# Patient Record
Sex: Female | Born: 1960 | Race: White | Hispanic: No | Marital: Single | Smoking: Former smoker
Health system: Southern US, Community
[De-identification: ages and names within clinical notes are randomized; demographics above are authoritative.]

## PROBLEM LIST (undated history)

## (undated) DIAGNOSIS — J189 Pneumonia, unspecified organism: Secondary | ICD-10-CM

## (undated) DIAGNOSIS — E119 Type 2 diabetes mellitus without complications: Secondary | ICD-10-CM

## (undated) DIAGNOSIS — F419 Anxiety disorder, unspecified: Secondary | ICD-10-CM

## (undated) DIAGNOSIS — I10 Essential (primary) hypertension: Secondary | ICD-10-CM

## (undated) DIAGNOSIS — F32A Depression, unspecified: Secondary | ICD-10-CM

## (undated) HISTORY — PX: ABDOMINAL HYSTERECTOMY: SHX81

## (undated) HISTORY — PX: CARPAL TUNNEL RELEASE: SHX101

---

## 2020-05-07 ENCOUNTER — Other Ambulatory Visit: Payer: Self-pay

## 2020-05-07 ENCOUNTER — Inpatient Hospital Stay (HOSPITAL_COMMUNITY)
Admission: AD | Admit: 2020-05-07 | Discharge: 2020-06-10 | DRG: 163 | Disposition: A | Discharge status: Home / Self Care | Payer: Medicare Other | Source: Other Acute Inpatient Hospital | Attending: Internal Medicine | Admitting: Internal Medicine

## 2020-05-07 DIAGNOSIS — Z66 Do not resuscitate: Secondary | ICD-10-CM | POA: Diagnosis present

## 2020-05-07 DIAGNOSIS — J690 Pneumonitis due to inhalation of food and vomit: Secondary | ICD-10-CM | POA: Diagnosis present

## 2020-05-07 DIAGNOSIS — E1140 Type 2 diabetes mellitus with diabetic neuropathy, unspecified: Secondary | ICD-10-CM | POA: Diagnosis present

## 2020-05-07 DIAGNOSIS — J431 Panlobular emphysema: Secondary | ICD-10-CM | POA: Diagnosis not present

## 2020-05-07 DIAGNOSIS — D696 Thrombocytopenia, unspecified: Secondary | ICD-10-CM | POA: Diagnosis not present

## 2020-05-07 DIAGNOSIS — J15212 Pneumonia due to Methicillin resistant Staphylococcus aureus: Secondary | ICD-10-CM | POA: Diagnosis present

## 2020-05-07 DIAGNOSIS — D62 Acute posthemorrhagic anemia: Secondary | ICD-10-CM | POA: Diagnosis present

## 2020-05-07 DIAGNOSIS — E1169 Type 2 diabetes mellitus with other specified complication: Secondary | ICD-10-CM

## 2020-05-07 DIAGNOSIS — I2699 Other pulmonary embolism without acute cor pulmonale: Secondary | ICD-10-CM | POA: Diagnosis not present

## 2020-05-07 DIAGNOSIS — R918 Other nonspecific abnormal finding of lung field: Secondary | ICD-10-CM | POA: Diagnosis not present

## 2020-05-07 DIAGNOSIS — B964 Proteus (mirabilis) (morganii) as the cause of diseases classified elsewhere: Secondary | ICD-10-CM | POA: Diagnosis not present

## 2020-05-07 DIAGNOSIS — J948 Other specified pleural conditions: Secondary | ICD-10-CM | POA: Diagnosis present

## 2020-05-07 DIAGNOSIS — Z9689 Presence of other specified functional implants: Secondary | ICD-10-CM

## 2020-05-07 DIAGNOSIS — Z6839 Body mass index (BMI) 39.0-39.9, adult: Secondary | ICD-10-CM

## 2020-05-07 DIAGNOSIS — G8929 Other chronic pain: Secondary | ICD-10-CM | POA: Diagnosis present

## 2020-05-07 DIAGNOSIS — J9383 Other pneumothorax: Secondary | ICD-10-CM | POA: Diagnosis not present

## 2020-05-07 DIAGNOSIS — L89152 Pressure ulcer of sacral region, stage 2: Secondary | ICD-10-CM | POA: Diagnosis present

## 2020-05-07 DIAGNOSIS — R069 Unspecified abnormalities of breathing: Secondary | ICD-10-CM

## 2020-05-07 DIAGNOSIS — Z20822 Contact with and (suspected) exposure to covid-19: Secondary | ICD-10-CM | POA: Diagnosis present

## 2020-05-07 DIAGNOSIS — J189 Pneumonia, unspecified organism: Secondary | ICD-10-CM

## 2020-05-07 DIAGNOSIS — J9311 Primary spontaneous pneumothorax: Secondary | ICD-10-CM

## 2020-05-07 DIAGNOSIS — I10 Essential (primary) hypertension: Secondary | ICD-10-CM | POA: Diagnosis present

## 2020-05-07 DIAGNOSIS — Y838 Other surgical procedures as the cause of abnormal reaction of the patient, or of later complication, without mention of misadventure at the time of the procedure: Secondary | ICD-10-CM | POA: Diagnosis not present

## 2020-05-07 DIAGNOSIS — Z515 Encounter for palliative care: Secondary | ICD-10-CM | POA: Diagnosis not present

## 2020-05-07 DIAGNOSIS — L7632 Postprocedural hematoma of skin and subcutaneous tissue following other procedure: Secondary | ICD-10-CM | POA: Diagnosis present

## 2020-05-07 DIAGNOSIS — J939 Pneumothorax, unspecified: Secondary | ICD-10-CM | POA: Diagnosis not present

## 2020-05-07 DIAGNOSIS — E785 Hyperlipidemia, unspecified: Secondary | ICD-10-CM | POA: Diagnosis present

## 2020-05-07 DIAGNOSIS — U071 COVID-19: Secondary | ICD-10-CM | POA: Diagnosis not present

## 2020-05-07 DIAGNOSIS — J9811 Atelectasis: Secondary | ICD-10-CM | POA: Diagnosis present

## 2020-05-07 DIAGNOSIS — J8410 Pulmonary fibrosis, unspecified: Secondary | ICD-10-CM | POA: Diagnosis present

## 2020-05-07 DIAGNOSIS — J95860 Postprocedural hematoma of a respiratory system organ or structure following a respiratory system procedure: Secondary | ICD-10-CM | POA: Diagnosis present

## 2020-05-07 DIAGNOSIS — J9381 Chronic pneumothorax: Secondary | ICD-10-CM

## 2020-05-07 DIAGNOSIS — J90 Pleural effusion, not elsewhere classified: Secondary | ICD-10-CM

## 2020-05-07 DIAGNOSIS — S301XXA Contusion of abdominal wall, initial encounter: Secondary | ICD-10-CM | POA: Diagnosis not present

## 2020-05-07 DIAGNOSIS — J95811 Postprocedural pneumothorax: Secondary | ICD-10-CM | POA: Diagnosis not present

## 2020-05-07 DIAGNOSIS — Z7189 Other specified counseling: Secondary | ICD-10-CM | POA: Diagnosis not present

## 2020-05-07 DIAGNOSIS — K219 Gastro-esophageal reflux disease without esophagitis: Secondary | ICD-10-CM | POA: Diagnosis present

## 2020-05-07 DIAGNOSIS — T8141XA Infection following a procedure, superficial incisional surgical site, initial encounter: Secondary | ICD-10-CM | POA: Diagnosis not present

## 2020-05-07 DIAGNOSIS — J9382 Other air leak: Secondary | ICD-10-CM | POA: Diagnosis not present

## 2020-05-07 DIAGNOSIS — Z90710 Acquired absence of both cervix and uterus: Secondary | ICD-10-CM

## 2020-05-07 DIAGNOSIS — J982 Interstitial emphysema: Secondary | ICD-10-CM | POA: Diagnosis present

## 2020-05-07 DIAGNOSIS — R0902 Hypoxemia: Secondary | ICD-10-CM

## 2020-05-07 DIAGNOSIS — J9621 Acute and chronic respiratory failure with hypoxia: Secondary | ICD-10-CM | POA: Diagnosis present

## 2020-05-07 DIAGNOSIS — J440 Chronic obstructive pulmonary disease with acute lower respiratory infection: Secondary | ICD-10-CM | POA: Diagnosis present

## 2020-05-07 DIAGNOSIS — U099 Post covid-19 condition, unspecified: Secondary | ICD-10-CM | POA: Diagnosis present

## 2020-05-07 DIAGNOSIS — J942 Hemothorax: Secondary | ICD-10-CM | POA: Diagnosis not present

## 2020-05-07 DIAGNOSIS — J941 Fibrothorax: Secondary | ICD-10-CM | POA: Diagnosis not present

## 2020-05-07 DIAGNOSIS — Z8616 Personal history of COVID-19: Secondary | ICD-10-CM | POA: Diagnosis not present

## 2020-05-07 DIAGNOSIS — E1165 Type 2 diabetes mellitus with hyperglycemia: Secondary | ICD-10-CM | POA: Diagnosis present

## 2020-05-07 DIAGNOSIS — F419 Anxiety disorder, unspecified: Secondary | ICD-10-CM | POA: Diagnosis present

## 2020-05-07 DIAGNOSIS — L304 Erythema intertrigo: Secondary | ICD-10-CM | POA: Diagnosis present

## 2020-05-07 DIAGNOSIS — L8990 Pressure ulcer of unspecified site, unspecified stage: Secondary | ICD-10-CM | POA: Insufficient documentation

## 2020-05-07 DIAGNOSIS — Z09 Encounter for follow-up examination after completed treatment for conditions other than malignant neoplasm: Secondary | ICD-10-CM

## 2020-05-07 DIAGNOSIS — K5900 Constipation, unspecified: Secondary | ICD-10-CM | POA: Diagnosis not present

## 2020-05-07 DIAGNOSIS — S27331A Laceration of lung, unilateral, initial encounter: Secondary | ICD-10-CM | POA: Diagnosis present

## 2020-05-07 DIAGNOSIS — I2693 Single subsegmental pulmonary embolism without acute cor pulmonale: Secondary | ICD-10-CM | POA: Diagnosis present

## 2020-05-07 DIAGNOSIS — R5381 Other malaise: Secondary | ICD-10-CM | POA: Diagnosis present

## 2020-05-07 DIAGNOSIS — J441 Chronic obstructive pulmonary disease with (acute) exacerbation: Secondary | ICD-10-CM | POA: Diagnosis not present

## 2020-05-07 DIAGNOSIS — Z7901 Long term (current) use of anticoagulants: Secondary | ICD-10-CM

## 2020-05-07 DIAGNOSIS — D72825 Bandemia: Secondary | ICD-10-CM | POA: Diagnosis present

## 2020-05-07 DIAGNOSIS — Z7984 Long term (current) use of oral hypoglycemic drugs: Secondary | ICD-10-CM

## 2020-05-07 DIAGNOSIS — Z87891 Personal history of nicotine dependence: Secondary | ICD-10-CM

## 2020-05-07 DIAGNOSIS — Z86718 Personal history of other venous thrombosis and embolism: Secondary | ICD-10-CM | POA: Diagnosis not present

## 2020-05-07 DIAGNOSIS — Z9889 Other specified postprocedural states: Secondary | ICD-10-CM

## 2020-05-07 DIAGNOSIS — B372 Candidiasis of skin and nail: Secondary | ICD-10-CM | POA: Diagnosis present

## 2020-05-07 DIAGNOSIS — J9601 Acute respiratory failure with hypoxia: Secondary | ICD-10-CM | POA: Diagnosis not present

## 2020-05-07 DIAGNOSIS — T797XXA Traumatic subcutaneous emphysema, initial encounter: Secondary | ICD-10-CM

## 2020-05-07 DIAGNOSIS — G4733 Obstructive sleep apnea (adult) (pediatric): Secondary | ICD-10-CM | POA: Diagnosis present

## 2020-05-07 DIAGNOSIS — Z79899 Other long term (current) drug therapy: Secondary | ICD-10-CM

## 2020-05-07 DIAGNOSIS — J449 Chronic obstructive pulmonary disease, unspecified: Secondary | ICD-10-CM

## 2020-05-07 DIAGNOSIS — I97638 Postprocedural hematoma of a circulatory system organ or structure following other circulatory system procedure: Secondary | ICD-10-CM | POA: Diagnosis not present

## 2020-05-07 DIAGNOSIS — I272 Pulmonary hypertension, unspecified: Secondary | ICD-10-CM | POA: Diagnosis present

## 2020-05-07 DIAGNOSIS — L89302 Pressure ulcer of unspecified buttock, stage 2: Secondary | ICD-10-CM | POA: Diagnosis not present

## 2020-05-07 DIAGNOSIS — Z9119 Patient's noncompliance with other medical treatment and regimen: Secondary | ICD-10-CM

## 2020-05-07 DIAGNOSIS — Z8701 Personal history of pneumonia (recurrent): Secondary | ICD-10-CM

## 2020-05-07 DIAGNOSIS — Z888 Allergy status to other drugs, medicaments and biological substances status: Secondary | ICD-10-CM

## 2020-05-07 DIAGNOSIS — I454 Nonspecific intraventricular block: Secondary | ICD-10-CM | POA: Diagnosis present

## 2020-05-07 DIAGNOSIS — R0602 Shortness of breath: Secondary | ICD-10-CM

## 2020-05-07 HISTORY — DX: Depression, unspecified: F32.A

## 2020-05-07 HISTORY — DX: Pneumonia, unspecified organism: J18.9

## 2020-05-07 HISTORY — DX: Anxiety disorder, unspecified: F41.9

## 2020-05-07 HISTORY — DX: Essential (primary) hypertension: I10

## 2020-05-07 HISTORY — DX: Type 2 diabetes mellitus without complications: E11.9

## 2020-05-07 LAB — GLUCOSE, CAPILLARY: Glucose-Capillary: 287 mg/dL — ABNORMAL HIGH (ref 70–99)

## 2020-05-07 MED ORDER — CLONAZEPAM 0.5 MG PO TABS
0.5000 mg | ORAL_TABLET | Freq: Two times a day (BID) | ORAL | Status: DC
  Administered 2020-05-08 – 2020-06-10 (×67): 0.5 mg via ORAL
  Filled 2020-05-07 (×67): qty 1

## 2020-05-07 MED ORDER — IPRATROPIUM-ALBUTEROL 0.5-2.5 (3) MG/3ML IN SOLN: 3.0000 mL | RESPIRATORY_TRACT | Status: DC | PRN

## 2020-05-07 MED ORDER — ATORVASTATIN CALCIUM 40 MG PO TABS
40.0000 mg | ORAL_TABLET | Freq: Every day | ORAL | Status: DC
  Administered 2020-05-08 – 2020-06-10 (×33): 40 mg via ORAL
  Filled 2020-05-07 (×34): qty 1

## 2020-05-07 MED ORDER — INSULIN ASPART 100 UNIT/ML ~~LOC~~ SOLN
0.0000 [IU] | Freq: Every day | SUBCUTANEOUS | Status: DC
  Administered 2020-05-08: 3 [IU] via SUBCUTANEOUS

## 2020-05-07 MED ORDER — INSULIN ASPART 100 UNIT/ML ~~LOC~~ SOLN
0.0000 [IU] | Freq: Three times a day (TID) | SUBCUTANEOUS | Status: DC
  Administered 2020-05-08 (×2): 3 [IU] via SUBCUTANEOUS

## 2020-05-07 MED ORDER — ACETAMINOPHEN 650 MG RE SUPP: 650.0000 mg | Freq: Four times a day (QID) | RECTAL | Status: DC | PRN

## 2020-05-07 MED ORDER — ACETAMINOPHEN 325 MG PO TABS: 650.0000 mg | ORAL_TABLET | Freq: Four times a day (QID) | ORAL | Status: DC | PRN

## 2020-05-07 MED ORDER — VITAMIN D 25 MCG (1000 UNIT) PO TABS
1000.0000 [IU] | ORAL_TABLET | Freq: Every day | ORAL | Status: DC
  Administered 2020-05-08 – 2020-06-10 (×33): 1000 [IU] via ORAL
  Filled 2020-05-07 (×33): qty 1

## 2020-05-07 MED ORDER — PREGABALIN 75 MG PO CAPS
150.0000 mg | ORAL_CAPSULE | Freq: Three times a day (TID) | ORAL | Status: DC
  Administered 2020-05-08 – 2020-06-10 (×98): 150 mg via ORAL
  Filled 2020-05-07 (×32): qty 2
  Filled 2020-05-07: qty 6
  Filled 2020-05-07 (×14): qty 2
  Filled 2020-05-07: qty 6
  Filled 2020-05-07 (×2): qty 2
  Filled 2020-05-07: qty 6
  Filled 2020-05-07 (×5): qty 2
  Filled 2020-05-07: qty 6
  Filled 2020-05-07 (×13): qty 2
  Filled 2020-05-07: qty 6
  Filled 2020-05-07 (×27): qty 2
  Filled 2020-05-07: qty 6

## 2020-05-07 MED ORDER — PANTOPRAZOLE SODIUM 40 MG PO TBEC
40.0000 mg | DELAYED_RELEASE_TABLET | Freq: Two times a day (BID) | ORAL | Status: DC
  Administered 2020-05-08 – 2020-05-11 (×9): 40 mg via ORAL
  Filled 2020-05-07 (×9): qty 1

## 2020-05-07 MED ORDER — HEPARIN (PORCINE) 25000 UT/250ML-% IV SOLN
1800.0000 [IU]/h | INTRAVENOUS | Status: DC
  Administered 2020-05-08 (×2): 1800 [IU]/h via INTRAVENOUS
  Filled 2020-05-07 (×2): qty 250

## 2020-05-07 MED ORDER — FENOFIBRATE 160 MG PO TABS
160.0000 mg | ORAL_TABLET | Freq: Every day | ORAL | Status: DC
  Administered 2020-05-08 – 2020-06-10 (×33): 160 mg via ORAL
  Filled 2020-05-07 (×34): qty 1

## 2020-05-07 NOTE — Progress Notes (Signed)
ANTICOAGULATION CONSULT NOTE - Initial Consult  Pharmacy Consult for Heparin  Indication: pulmonary embolus  Allergies  Allergen Reactions  . Cymbalta [Duloxetine Hcl]     Psychosis "self-harm"   Patient Measurements: Height: 5\' 8"  (172.7 cm) Weight: 117.9 kg (259 lb 14.8 oz) IBW/kg (Calculated) : 63.9  Vital Signs: Temp: 97.3 F (36.3 C) (11/23 2100) Temp Source: Oral (11/23 2100) BP: 121/85 (11/23 2100) Pulse Rate: 81 (11/23 2100)  Assessment: 59 y/o F transfer from [facility]. Pt has PE and had been on Apixaban. Heparin transfusing at 1800 units/hr when she arrived at [facility]. RN did turn heparin off upon transfer for a few hours.   Goal of Therapy:  Heparin level 0.3-0.7 units/ml Monitor platelets by anticoagulation protocol: Yes   Plan:  -Re-start heparin drip at 1800 units/hr -0800 heparin level -Daily CBC/heparin level -Monitor for bleeding   [facility], PharmD, BCPS Clinical Pharmacist Phone: [phone]

## 2020-05-07 NOTE — H&P (Signed)
History and Physical    Artis Buechele FWY:[id number] DOB: 06-Jul-1960 DOA: 05/07/2020  PCP: No primary care provider on file. Patient coming from: [facility]  HPI: Annette [name] is a 59 y.o. female with medical history significant of Covid pneumonia approximately 2 months ago, recent PE on Eliquis, COPD, asthma, hypertension, diabetes presenting to [facility] today from [facility] where she was admitted for right upper lobe MRSA pneumonia (treated with linezolid) and right lower lobe subsegmental PE (echo showing moderate pulmonary hypertension but no right heart strain) treated with heparin drip, developed right-sided hydropneumothorax 2 days ago.  Underwent chest tube insertion yesterday and stable on 3 L supplemental oxygen.  Pulmonology at [facility] spoke to CT surgeon at [facility] who recommended transfer to [facility] for VATS.  Signout obtained from Dr. Chipper Herb who is the physician who accepted transfer from [facility].  No discharge summary or imaging results in the chart sent by the outside hospital.  Patient currently somnolent and history limited.  States she went to the hospital as she was feeling short of breath and coughing, unclear when her symptoms started.  Denies any fevers or chills.  Denies chest pain.  States since she had a chest tube placed yesterday her breathing is now better.  Reports history of COPD but is not on home oxygen.  No additional history could be obtained from her.  Review of Systems:  All systems reviewed and apart from history of presenting illness, are negative.  Past medical history: See HPI.  Past Surgical History:  Procedure Laterality Date  . ABDOMINAL HYSTERECTOMY       reports that she has quit smoking. Her smoking use included cigarettes. She has never used smokeless tobacco. She reports previous alcohol use. She reports that she does not use drugs.  Allergies  Allergen Reactions  . Cymbalta  [Duloxetine Hcl]     Psychosis "self-harm"    History reviewed. No pertinent family history.  Prior to Admission medications   Medication Sig Start Date End Date Taking? Authorizing Provider  atorvastatin (LIPITOR) 40 MG tablet Take 40 mg by mouth daily. 03/14/20  Yes [provider]  cholecalciferol (VITAMIN D3) 25 MCG (1000 UNIT) tablet Take 1,000 Units by mouth daily.   Yes [provider]  clonazePAM (KLONOPIN) 0.5 MG tablet Take 0.5 mg by mouth 2 (two) times daily.  04/11/20  Yes [provider]  fenofibrate 160 MG tablet Take 160 mg by mouth daily. 02/15/20  Yes [provider]  glipiZIDE (GLUCOTROL) 10 MG tablet Take 10 mg by mouth 2 (two) times daily. 12/20/19  Yes [provider]  montelukast (SINGULAIR) 10 MG tablet Take 10 mg by mouth daily. 03/14/20  Yes [provider]  omeprazole (PRILOSEC) 40 MG capsule Take 40 mg by mouth 2 (two) times daily. 02/18/20  Yes [provider]  pregabalin (LYRICA) 150 MG capsule Take 150 mg by mouth in the morning, at noon, and at bedtime.  04/15/20  Yes [provider]    Physical Exam: Vitals:   05/07/20 2100  BP: 121/85  Pulse: 81  Resp: 20  Temp: (!) 97.3 F (36.3 C)  TempSrc: Oral  SpO2: 97%  Weight: 117.9 kg  Height: 5\' 8"  (1.727 m)    Physical Exam Constitutional:      General: She is not in acute distress. HENT:     Head: Normocephalic and atraumatic.  Eyes:     Extraocular Movements: Extraocular movements intact.  Conjunctiva/sclera: Conjunctivae normal.  Cardiovascular:     Rate and Rhythm: Normal rate and regular rhythm.     Pulses: Normal pulses.  Pulmonary:     Effort: Pulmonary effort is normal.     Breath sounds: No wheezing.     Comments: Satting well on 3 L supplemental oxygen Abdominal:     General: Bowel sounds are normal. There is no distension.     Palpations: Abdomen is soft.     Tenderness: There is no abdominal tenderness.   Musculoskeletal:        General: No swelling or tenderness.     Cervical back: Normal range of motion and neck supple.  Skin:    General: Skin is warm and dry.  Neurological:     Mental Status: She is alert and oriented to person, place, and time.     Labs on Admission: I have personally reviewed following labs and imaging studies  CBC: No results for input(s): WBC, NEUTROABS, HGB, HCT, MCV, PLT in the last 168 hours. Basic Metabolic Panel: No results for input(s): NA, K, CL, CO2, GLUCOSE, BUN, CREATININE, CALCIUM, MG, PHOS in the last 168 hours. GFR: CrCl cannot be calculated (No successful lab value found.). Liver Function Tests: No results for input(s): AST, ALT, ALKPHOS, BILITOT, PROT, ALBUMIN in the last 168 hours. No results for input(s): LIPASE, AMYLASE in the last 168 hours. No results for input(s): AMMONIA in the last 168 hours. Coagulation Profile: No results for input(s): INR, PROTIME in the last 168 hours. Cardiac Enzymes: No results for input(s): CKTOTAL, CKMB, CKMBINDEX, TROPONINI in the last 168 hours. BNP (last 3 results) No results for input(s): PROBNP in the last 8760 hours. HbA1C: No results for input(s): HGBA1C in the last 72 hours. CBG: Recent Labs  Lab 05/07/20 2200  GLUCAP 287*   Lipid Profile: No results for input(s): CHOL, HDL, LDLCALC, TRIG, CHOLHDL, LDLDIRECT in the last 72 hours. Thyroid Function Tests: No results for input(s): TSH, T4TOTAL, FREET4, T3FREE, THYROIDAB in the last 72 hours. Anemia Panel: No results for input(s): VITAMINB12, FOLATE, FERRITIN, TIBC, IRON, RETICCTPCT in the last 72 hours. Urine analysis: No results found for: COLORURINE, APPEARANCEUR, LABSPEC, PHURINE, GLUCOSEU, HGBUR, BILIRUBINUR, KETONESUR, PROTEINUR, UROBILINOGEN, NITRITE, LEUKOCYTESUR  Radiological Exams on Admission: No results found.  Assessment/Plan Principal Problem:   MRSA pneumonia (HCC) Active Problems:   Pulmonary embolism (HCC)    Pneumothorax   Acute hypoxemic respiratory failure (HCC)   COPD (chronic obstructive pulmonary disease) (HCC)   Right upper lobe MRSA pneumonia No fever or tachycardia at this time.  Stable on 3 L supplemental oxygen. -Check WBC count, lactate, procalcitonin, blood cultures -Continue linezolid  Acute right lower lobe subsegmental PE -Echo done at outside hospital revealed moderate pulmonary hypertension but no right heart strain -Continue heparin for anticoagulation  Right-sided hydropneumothorax Underwent chest tube insertion yesterday at [facility].  Currently stable on 3 L supplemental oxygen.  Pulmonology at [facility] spoke to CT surgeon at [facility] who recommended transfer for VATS. -Repeat chest x-ray now -I have notified Dr. Cliffton Asters from cardiothoracic surgery that the patient is now here and have requested consultation.  He requested chest CT in the morning which has been ordered.  Acute hypoxemic respiratory failure -Currently stable on 3 L supplemental oxygen -Continuous pulse ox  COPD Stable.  No signs of acute exacerbation at this time. -DuoNebs as needed  Hypertension Stable.  Currently normotensive. -Pharmacy med rec pending.  Hyperlipidemia -Continue Lipitor and fenofibrate  Noninsulin-dependent type II diabetes -  Check A1c.  Hold home glipizide.  Sliding scale insulin sensitive ACHS and CBG checks.  GERD -Continue PPI  Anxiety -Continue home Klonopin  Neuropathy/chronic pain -Continue home Lyrica  DVT prophylaxis: Heparin Code Status: Full code Family Communication: No family available at this time. Disposition Plan: Status is: Inpatient  Remains inpatient appropriate because:IV treatments appropriate due to intensity of illness or inability to take PO, Inpatient level of care appropriate due to severity of illness and Needs VATS   Dispo: The patient is from: [facility]              Anticipated d/c is to: Home               Anticipated d/c date is: > 3 days              Patient currently is not medically stable to d/c.  The medical decision making on this patient was of high complexity and the patient is at high risk for clinical deterioration, therefore this is a level 3 visit.  John Giovanni MD [facility]  If 7PM-7AM, please contact night-coverage [email or web address]  05/08/2020, 12:03 AM

## 2020-05-08 ENCOUNTER — Inpatient Hospital Stay (HOSPITAL_COMMUNITY): Payer: Medicare Other

## 2020-05-08 ENCOUNTER — Encounter (HOSPITAL_COMMUNITY): Payer: Self-pay | Admitting: Internal Medicine

## 2020-05-08 DIAGNOSIS — Z8616 Personal history of COVID-19: Secondary | ICD-10-CM

## 2020-05-08 DIAGNOSIS — J9311 Primary spontaneous pneumothorax: Secondary | ICD-10-CM | POA: Diagnosis not present

## 2020-05-08 DIAGNOSIS — L89302 Pressure ulcer of unspecified buttock, stage 2: Secondary | ICD-10-CM

## 2020-05-08 DIAGNOSIS — J9601 Acute respiratory failure with hypoxia: Secondary | ICD-10-CM | POA: Diagnosis not present

## 2020-05-08 DIAGNOSIS — J15212 Pneumonia due to Methicillin resistant Staphylococcus aureus: Secondary | ICD-10-CM | POA: Diagnosis not present

## 2020-05-08 DIAGNOSIS — J9383 Other pneumothorax: Secondary | ICD-10-CM | POA: Diagnosis not present

## 2020-05-08 DIAGNOSIS — E1165 Type 2 diabetes mellitus with hyperglycemia: Secondary | ICD-10-CM

## 2020-05-08 DIAGNOSIS — I2693 Single subsegmental pulmonary embolism without acute cor pulmonale: Secondary | ICD-10-CM | POA: Diagnosis not present

## 2020-05-08 DIAGNOSIS — L8990 Pressure ulcer of unspecified site, unspecified stage: Secondary | ICD-10-CM | POA: Insufficient documentation

## 2020-05-08 LAB — CBC
HCT: 36.3 % (ref 36.0–46.0)
Hemoglobin: 11.2 g/dL — ABNORMAL LOW (ref 12.0–15.0)
MCH: 28.1 pg (ref 26.0–34.0)
MCHC: 30.9 g/dL (ref 30.0–36.0)
MCV: 91 fL (ref 80.0–100.0)
Platelets: 129 10*3/uL — ABNORMAL LOW (ref 150–400)
RBC: 3.99 MIL/uL (ref 3.87–5.11)
RDW: 15.9 % — ABNORMAL HIGH (ref 11.5–15.5)
WBC: 12.2 10*3/uL — ABNORMAL HIGH (ref 4.0–10.5)
nRBC: 0 % (ref 0.0–0.2)

## 2020-05-08 LAB — COMPREHENSIVE METABOLIC PANEL
ALT: 34 U/L (ref 0–44)
AST: 25 U/L (ref 15–41)
Albumin: 1.8 g/dL — ABNORMAL LOW (ref 3.5–5.0)
Alkaline Phosphatase: 51 U/L (ref 38–126)
Anion gap: 5 (ref 5–15)
BUN: 21 mg/dL — ABNORMAL HIGH (ref 6–20)
CO2: 34 mmol/L — ABNORMAL HIGH (ref 22–32)
Calcium: 8.3 mg/dL — ABNORMAL LOW (ref 8.9–10.3)
Chloride: 96 mmol/L — ABNORMAL LOW (ref 98–111)
Creatinine, Ser: 0.7 mg/dL (ref 0.44–1.00)
GFR, Estimated: >60 mL/min (ref >60)
Glucose, Bld: 282 mg/dL — ABNORMAL HIGH (ref 70–99)
Potassium: 4.6 mmol/L (ref 3.5–5.1)
Sodium: 135 mmol/L (ref 135–145)
Total Bilirubin: 0.8 mg/dL (ref 0.3–1.2)
Total Protein: 4.2 g/dL — ABNORMAL LOW (ref 6.5–8.1)

## 2020-05-08 LAB — HEMOGLOBIN A1C
Hgb A1c MFr Bld: 10.7 % — ABNORMAL HIGH (ref 4.8–5.6)
Mean Plasma Glucose: 260.39 mg/dL

## 2020-05-08 LAB — HEPARIN LEVEL (UNFRACTIONATED): Heparin Unfractionated: <0.1 IU/mL — ABNORMAL LOW (ref 0.30–0.70)

## 2020-05-08 LAB — PROCALCITONIN: Procalcitonin: <0.1 ng/mL

## 2020-05-08 LAB — GLUCOSE, CAPILLARY
Glucose-Capillary: 268 mg/dL — ABNORMAL HIGH (ref 70–99)
Glucose-Capillary: 201 mg/dL — ABNORMAL HIGH (ref 70–99)
Glucose-Capillary: 210 mg/dL — ABNORMAL HIGH (ref 70–99)
Glucose-Capillary: 193 mg/dL — ABNORMAL HIGH (ref 70–99)
Glucose-Capillary: 340 mg/dL — ABNORMAL HIGH (ref 70–99)

## 2020-05-08 LAB — LACTIC ACID, PLASMA: Lactic Acid, Venous: 1.6 mmol/L (ref 0.5–1.9)

## 2020-05-08 LAB — HIV ANTIBODY (ROUTINE TESTING W REFLEX): HIV Screen 4th Generation wRfx: NONREACTIVE

## 2020-05-08 MED ORDER — HEPARIN BOLUS VIA INFUSION
3000.0000 [IU] | Freq: Once | INTRAVENOUS | Status: AC
  Administered 2020-05-08: 3000 [IU] via INTRAVENOUS
  Filled 2020-05-08: qty 3000

## 2020-05-08 MED ORDER — OXYCODONE HCL 5 MG PO TABS
5.0000 mg | ORAL_TABLET | Freq: Four times a day (QID) | ORAL | Status: DC | PRN
  Administered 2020-05-08 – 2020-05-12 (×6): 5 mg via ORAL
  Filled 2020-05-08 (×6): qty 1

## 2020-05-08 MED ORDER — ACETAMINOPHEN 500 MG PO TABS
1000.0000 mg | ORAL_TABLET | Freq: Three times a day (TID) | ORAL | Status: DC
  Administered 2020-05-08 – 2020-05-12 (×12): 1000 mg via ORAL
  Filled 2020-05-08 (×12): qty 2

## 2020-05-08 MED ORDER — HEPARIN (PORCINE) 25000 UT/250ML-% IV SOLN
1800.0000 [IU]/h | INTRAVENOUS | Status: DC
  Administered 2020-05-09 – 2020-05-10 (×3): 1800 [IU]/h via INTRAVENOUS
  Filled 2020-05-08 (×3): qty 250

## 2020-05-08 MED ORDER — INSULIN ASPART 100 UNIT/ML ~~LOC~~ SOLN
0.0000 [IU] | Freq: Every day | SUBCUTANEOUS | Status: DC
  Administered 2020-05-08: 4 [IU] via SUBCUTANEOUS

## 2020-05-08 MED ORDER — PNEUMOCOCCAL VAC POLYVALENT 25 MCG/0.5ML IJ INJ: 0.5000 mL | INJECTION | INTRAMUSCULAR | Status: DC | PRN

## 2020-05-08 MED ORDER — MORPHINE SULFATE (PF) 2 MG/ML IV SOLN
2.0000 mg | INTRAVENOUS | Status: DC | PRN
  Administered 2020-05-08: 2 mg via INTRAVENOUS
  Administered 2020-05-08: 4 mg via INTRAVENOUS
  Filled 2020-05-08: qty 2
  Filled 2020-05-08: qty 1

## 2020-05-08 MED ORDER — FENTANYL CITRATE (PF) 100 MCG/2ML IJ SOLN
INTRAMUSCULAR | Status: AC
  Filled 2020-05-08: qty 2

## 2020-05-08 MED ORDER — INFLUENZA VAC SPLIT QUAD 0.5 ML IM SUSY: 0.5000 mL | PREFILLED_SYRINGE | INTRAMUSCULAR | Status: DC | PRN

## 2020-05-08 MED ORDER — LINEZOLID 600 MG PO TABS
600.0000 mg | ORAL_TABLET | Freq: Two times a day (BID) | ORAL | Status: DC
  Administered 2020-05-08 – 2020-05-14 (×13): 600 mg via ORAL
  Filled 2020-05-08 (×15): qty 1

## 2020-05-08 MED ORDER — MIDAZOLAM HCL 2 MG/2ML IJ SOLN
INTRAMUSCULAR | Status: AC | PRN
  Administered 2020-05-08: 0.5 mg via INTRAVENOUS
  Administered 2020-05-08: 1 mg via INTRAVENOUS

## 2020-05-08 MED ORDER — MIDAZOLAM HCL 2 MG/2ML IJ SOLN
INTRAMUSCULAR | Status: AC
  Filled 2020-05-08: qty 2

## 2020-05-08 MED ORDER — HYDROCODONE-ACETAMINOPHEN 5-325 MG PO TABS
1.0000 | ORAL_TABLET | ORAL | Status: DC | PRN
  Administered 2020-05-08: 2 via ORAL
  Filled 2020-05-08: qty 2
  Filled 2020-05-08: qty 1

## 2020-05-08 MED ORDER — FENTANYL CITRATE (PF) 100 MCG/2ML IJ SOLN
INTRAMUSCULAR | Status: AC | PRN
Start: 2020-05-08 — End: 2020-05-08
  Administered 2020-05-08: 25 ug via INTRAVENOUS

## 2020-05-08 MED ORDER — INSULIN ASPART 100 UNIT/ML ~~LOC~~ SOLN
0.0000 [IU] | Freq: Three times a day (TID) | SUBCUTANEOUS | Status: DC
  Administered 2020-05-08: 3 [IU] via SUBCUTANEOUS
  Administered 2020-05-09 (×2): 8 [IU] via SUBCUTANEOUS

## 2020-05-08 MED ORDER — MORPHINE SULFATE (PF) 2 MG/ML IV SOLN
3.0000 mg | INTRAVENOUS | Status: DC | PRN
  Administered 2020-05-08 – 2020-05-12 (×25): 3 mg via INTRAVENOUS
  Filled 2020-05-08 (×25): qty 2

## 2020-05-08 NOTE — Progress Notes (Signed)
ANTICOAGULATION CONSULT NOTE  Pharmacy Consult for Heparin  Indication: pulmonary embolus  Allergies  Allergen Reactions  . Cymbalta [Duloxetine Hcl]     Psychosis "self-harm"   Patient Measurements: Height: 5\' 8"  (172.7 cm) Weight: 118.8 kg (261 lb 14.5 oz) IBW/kg (Calculated) : 63.9  Vital Signs: BP: (P) 123/67 (11/24 1644) Pulse Rate: (P) 101 (11/24 1644)  Assessment: 59 y/o F transfer from [facility]. Pt has PE and had been on Apixaban recently (last dose not clear) and was transferred on heparin   -heparin level < 0.1 -RN reports IV line infiltrated, a new line was placed and heparin was restarted ~ 9:30am  Pt is s/p chest tube placement. Ok to resume IV heparin for PE. We will resume without bolus.   Goal of Therapy:  Heparin level 0.3-0.7 units/ml Monitor platelets by anticoagulation protocol: Yes   Plan:  -Resume heparin 1800 units/hr -Heparin level in 6 hours and daily wth CBC daily  [facility], PharmD, [place], AAHIVP, CPP Infectious Disease Pharmacist 05/08/2020 4:47 PM

## 2020-05-08 NOTE — Progress Notes (Addendum)
Inpatient Diabetes Program Recommendations  AACE/ADA: New Consensus Statement on Inpatient Glycemic Control (2015)  Target Ranges:  Prepandial:   less than 140 mg/dL      Peak postprandial:   less than 180 mg/dL (1-2 hours)      Critically ill patients:  140 - 180 mg/dL   Results for [facility] (MRN [id number]) as of 05/08/2020 11:09  Ref. Range 05/07/2020 22:00 05/08/2020 01:08 05/08/2020 07:56  Glucose-Capillary Latest Ref Range: 70 - 99 mg/dL 287 (H) 268 (H)  3 units NOVOLOG 201 (H)  3 units NOVOLOG    Results for Annette [name], Annette [name] (MRN [id number]) as of 05/08/2020 07:11  Ref. Range 05/08/2020 00:16  Hemoglobin A1C Latest Ref Range: 4.8 - 5.6 % 10.7 (H)  260 mg/dl   Admit with: right upper lobe MRSA pneumonia/ right lower lobe subsegmental PE   History: DM  Home DM Meds: Glipizide 10 mg BID       Humalog SSi (per pt report)  Current Orders: Novolog Sensitive Correction Scale/ SSI (0-9 units) TID AC + HS    MD- May consider adding low dose basal insulin to current inpatient insulin regimen:  Lantus 8 units QHS (0.075 units/kg)  May need escalation of home DM meds--Only taking Glipizide at home and current A1c= 10.7%    Addendum 12pm--Met w/ pt at bedside this AM.  Kept visit short as pt seemed very uncomfortable this afternoon.  Pt stated she has been taking Glipizide 10 mg BID + Humalog SSI TID with meals at home.  Sees Dr. Delia Chimes in [place] for regular medical care.  Reviewed her Current A1c of 10.7% and the significance of this.  Discussed with pt that given her A1c and current illness, she may need escalation of DM meds for home at time of d/c.  Pt stated she is already taking Humalog insulin and would be OK taking more insulin if needed.    --Will follow patient during hospitalization--  Wyn Quaker RN, MSN, CDE Diabetes Coordinator Inpatient Glycemic Control Team Team Pager: [phone] (8a-5p)

## 2020-05-08 NOTE — Progress Notes (Signed)
Patient stated she got no relief from morphine given

## 2020-05-08 NOTE — Progress Notes (Addendum)
Patient arrived to the hospital transported via ambulance, alert  and oriented times four, denies pain at time of arrival. oriented to room/unit policies. Admission question completed, skin assesemnt done with [facility] RN. Patient has chest tube on her right lateral chest placed  while at  the other hospital. chest tube is clamped on time of arrival, and the chamber has lots of bumbling. Called the rapid response RN to came help assess patient's chest tube. CT insicion site covered with dressing, CDI no drainage noted.  Pt. has Heparin infusion also at time of arrival infusing @18ml /hr. Notified hospitalist - new admissions/consult   about patient's arrival and for new orders.

## 2020-05-08 NOTE — Consult Note (Signed)
[address]       [place] [address]             [id number]                    Annette [name] [facility] Medical Record #[id number] Date of Birth: [birth year]  Referring: No ref. provider found Primary Care: Patient, No Pcp Per Primary Cardiologist: No primary care provider on file.  Chief Complaint:   No chief complaint on file.   History of Present Illness:    Annette [name] 59 y.o. female transferred from an outside facility to assist with management of a complicated pneumothorax.  She has a history of Covid pneumonia 2 months ago which was complicated by her embolisms.  She was admitted to [facility] for management of a right-sided MRSA pneumonia she subsequently developed a hydropneumothorax and required chest tube insertion.  Due to the complexity of her pneumothorax she was transferred to [facility] for further care.      Past medical history COPD Hypertension Diabetes Asthma   Past Surgical History:  Procedure Laterality Date  . ABDOMINAL HYSTERECTOMY      History reviewed. No pertinent family history.   Social History   Tobacco Use  Smoking Status Former Smoker  . Types: Cigarettes  Smokeless Tobacco Never Used    Social History   Substance and Sexual Activity  Alcohol Use Not Currently     Allergies  Allergen Reactions  . Cymbalta [Duloxetine Hcl]     Psychosis "self-harm"    Current Facility-Administered Medications  Medication Dose Route Frequency Provider Last Rate Last Admin  . acetaminophen (TYLENOL) tablet 650 mg  650 mg Oral Q6H PRN John Giovanni, MD       Or  . acetaminophen (TYLENOL) suppository 650 mg  650 mg Rectal Q6H PRN John Giovanni, MD      . atorvastatin (LIPITOR) tablet 40 mg  40 mg Oral Daily John Giovanni, MD   40 mg at 05/08/20 1056  . cholecalciferol (VITAMIN D3) tablet 1,000 Units  1,000 Units Oral Daily John Giovanni, MD   1,000 Units at 05/08/20 1056   . clonazePAM (KLONOPIN) tablet 0.5 mg  0.5 mg Oral BID John Giovanni, MD   0.5 mg at 05/08/20 1056  . fenofibrate tablet 160 mg  160 mg Oral Daily John Giovanni, MD   160 mg at 05/08/20 1057  . heparin ADULT infusion 100 units/mL (25000 units/250mL sodium chloride 0.45%)  1,800 Units/hr Intravenous Continuous Stevphen Rochester, RPH 18 mL/hr at 05/08/20 0040 1,800 Units/hr at 05/08/20 0040  . insulin aspart (novoLOG) injection 0-5 Units  0-5 Units Subcutaneous QHS John Giovanni, MD   3 Units at 05/08/20 0118  . insulin aspart (novoLOG) injection 0-9 Units  0-9 Units Subcutaneous TID WC John Giovanni, MD   3 Units at 05/08/20 0850  . ipratropium-albuterol (DUONEB) 0.5-2.5 (3) MG/3ML nebulizer solution 3 mL  3 mL Nebulization Q4H PRN John Giovanni, MD      . linezolid (ZYVOX) tablet 600 mg  600 mg Oral Q12H John Giovanni, MD   600 mg at 05/08/20 1057  . morphine 2 MG/ML injection 2-4 mg  2-4 mg Intravenous Q4H PRN Opyd, Lavone Neri, MD   4 mg at 05/08/20 0930  . pantoprazole (PROTONIX) EC tablet 40 mg  40 mg Oral BID John Giovanni, MD   40 mg at 05/08/20 1057  . pregabalin (LYRICA) capsule 150 mg  150 mg  Oral TID John Giovanni, MD   150 mg at 05/08/20 1057    Review of Systems  Respiratory: Positive for cough and shortness of breath.   Cardiovascular: Positive for chest pain.     PHYSICAL EXAMINATION: BP (!) 144/81 (BP Location: Right Arm)   Pulse 85   Temp 97.7 F (36.5 C) (Oral)   Resp (!) 23   Ht 5\' 8"  (1.727 m)   Wt 118.8 kg   SpO2 96%   BMI 39.82 kg/m  Physical Exam Constitutional:      General: She is not in acute distress.    Appearance: She is not ill-appearing or toxic-appearing.  HENT:     Head: Normocephalic and atraumatic.  Pulmonary:     Effort: Pulmonary effort is normal.     Comments: Coarse breath sounds Large air leak on Pleur-evac system. Neurological:     General: No focal deficit present.     Mental Status: She is alert  and oriented to person, place, and time.     Diagnostic Studies & Laboratory data:     Recent Radiology Findings:   CT CHEST WO CONTRAST  Result Date: 05/08/2020 CLINICAL DATA:  Shortness of breath and cough. EXAM: CT CHEST WITHOUT CONTRAST TECHNIQUE: Multidetector CT imaging of the chest was performed following the standard protocol without IV contrast. COMPARISON:  Chest radiograph 05/08/2020 FINDINGS: Cardiovascular: Normal heart size. No pericardial effusions. Normal caliber thoracic aorta with scattered calcification. Coronary artery calcification. Mediastinum/Nodes: Pneumomediastinum is present. Subcutaneous emphysema throughout the right chest wall and extending to the right side of the neck. No significant lymphadenopathy. Visualized lymph nodes are not pathologically enlarged. Esophagus is mostly decompressed. Lungs/Pleura: Moderately large right pneumothorax. A right chest tube is in place. Atelectasis and consolidation in the right lung most prominent in the right upper lung. Air bronchograms and emphysematous changes are present. Small bilateral pleural effusions. Emphysematous changes and scattered fibrosis in the left lung. Upper Abdomen: Cyst in the liver. No acute process demonstrated in the upper abdomen. Musculoskeletal: Degenerative changes in the spine. No destructive bone lesions. IMPRESSION: 1. Moderately large right pneumothorax with right chest tube in place. Subcutaneous emphysema throughout the right chest wall and extending to the right side of the neck. Pneumomediastinum. 2. Atelectasis and consolidation in the right lung most prominent in the right upper lung. 3. Small bilateral pleural effusions. 4. Emphysematous changes and fibrosis in the left lung. 5. Emphysema and aortic atherosclerosis. Aortic Atherosclerosis (ICD10-I70.0) and Emphysema (ICD10-J43.9). Electronically Signed   By: 05/10/2020 M.D.   On: 05/08/2020 02:15   DG CHEST PORT 1 VIEW  Result Date:  05/08/2020 CLINICAL DATA:  Pneumothorax EXAM: PORTABLE CHEST 1 VIEW COMPARISON:  November 28, 2012 FINDINGS: There is a right-sided chest tube in place. The chest tube may be kinked. There is extensive subcutaneous gas along the patient's right chest wall and right neck. There is diffuse opacification of the right upper lung zone as well as the right lower lung zone. There is a probable small right-sided pneumothorax. No evidence for left-sided pneumothorax. The heart size is unremarkable. IMPRESSION: 1. Right-sided chest tube in place.  The chest tube may be kinked. 2. Probable small right-sided pneumothorax. 3. Diffuse opacification of the right upper lung zone and portions of the right lower lobe which may be postsurgical or posttraumatic in etiology. Alternatively, this may represent multifocal pneumonia. 4. Subcutaneous gas is noted along the patient's right chest wall and neck. Electronically Signed   By: November 30, 2012.D.  On: 05/08/2020 00:33       I have independently reviewed the above radiology studies  and reviewed the findings with the patient.   Recent Lab Findings: Lab Results  Component Value Date   WBC 12.2 (H) 05/08/2020   HGB 11.2 (L) 05/08/2020   HCT 36.3 05/08/2020   PLT 129 (L) 05/08/2020   GLUCOSE 282 (H) 05/08/2020   ALT 34 05/08/2020   AST 25 05/08/2020   NA 135 05/08/2020   K 4.6 05/08/2020   CL 96 (L) 05/08/2020   CREATININE 0.70 05/08/2020   BUN 21 (H) 05/08/2020   CO2 34 (H) 05/08/2020   HGBA1C 10.7 (H) 05/08/2020     Assessment / Plan:   59 year old female presents with a complicated hydropneumothorax in the setting of an MRSA pneumonia.  She is also had Covid pneumonia 2 months ago.  Personally reviewed her CT scan, and she still has a large pneumothorax apically as well as significant subcutaneous and mediastinal emphysema.  She denies any dysphagia or diet aphasia thus I think the pneumomediastinum is all related to her pulmonary process.  Her right  upper lobe is completely consolidated, and the surgical management of this will likely not yield a good result.    I have ordered a CT-guided drain to be placed apically to assist with lung expansion for now we will continue conservative management with chest tube drainage.    Of note the chest tube currently in place appears to be in the fissure which likely explains incomplete expansion of the lung.   I  spent 30 minutes with  the patient face to face and greater then 50% of the time was spent in counseling and coordination of care.    Corliss Skains 05/08/2020 10:58 AM

## 2020-05-08 NOTE — Procedures (Signed)
  Procedure: CT guided R chest tube placement 38f EBL:   minimal Complications:  none immediate  See full dictation in [employer].  Thora Lance MD Main # [phone] Pager  [phone] Mobile [phone]

## 2020-05-08 NOTE — Progress Notes (Signed)
ANTICOAGULATION CONSULT NOTE  Pharmacy Consult for Heparin  Indication: pulmonary embolus  Allergies  Allergen Reactions  . Cymbalta [Duloxetine Hcl]     Psychosis "self-harm"   Patient Measurements: Height: 5\' 8"  (172.7 cm) Weight: 118.8 kg (261 lb 14.5 oz) IBW/kg (Calculated) : 63.9  Vital Signs: Temp: 97.7 F (36.5 C) (11/24 0414) Temp Source: Oral (11/24 0414) BP: 144/81 (11/24 0414) Pulse Rate: 85 (11/24 0414)  Assessment: 59 y/o F transfer from [facility]. Pt has PE and had been on Apixaban recently (last dose not clear) and was transferred on heparin   -heparin level < 0.1 -RN reports IV line infiltrated, a new line was placed and heparin was restarted ~ 9:30am  Goal of Therapy:  Heparin level 0.3-0.7 units/ml Monitor platelets by anticoagulation protocol: Yes   Plan:  -Heparin 3000 unit bolus and continue infusion at 1800 units/hr -Heparin level in 6 hours and daily wth CBC daily  [facility], PharmD Clinical Pharmacist **Pharmacist phone directory can now be found on [email or web address] ([email or web address]).  Listed under [facility].

## 2020-05-08 NOTE — Consult Note (Signed)
Chief Complaint: Patient was seen in consultation today for right hydropneumothorax/right chest tube placement.  Referring Physician(s): Annette [name] (TCTS)  Supervising Physician: Annette [name]  Patient Status: Annette [name] - In-pt  History of Present Illness: Annette [name] is a 59 y.o. female with a past medical history of hypertension, COPD, asthma, PE on Eliquis, COVID-19 pneumonia approximately 2 months ago, and diabetes mellitus. She was transferred from [name] to [facility] today for management of complex right hydropneumothorax. Per notes, patient was admitted to outside facility for management of RUL MRSA pneumonia and RLL subsegmental PE. Approximately 2 days ago, developed right hydropneumothorax- a chest tube was placed yesterday. She was then transferred for escalation of care. TCTS was consulted who recommended IR consult for possible apical right chest tube placement.  CT chest this AM: 1. Moderately large right pneumothorax with right chest tube in place. Subcutaneous emphysema throughout the right chest wall and extending to the right side of the neck. Pneumomediastinum. 2. Atelectasis and consolidation in the right lung most prominent in the right upper lung. 3. Small bilateral pleural effusions. 4. Emphysematous changes and fibrosis in the left lung. 5. Emphysema and aortic atherosclerosis.  IR consulted by Annette [name] for possible image-guided right chest tube placement. Patient awake and alert laying in bed with no complaints at this time. Denies fever, chills, chest pain, dyspnea, abdominal pain, or headache.  On heparin gtt.   No past medical history on file.  Past Surgical History:  Procedure Laterality Date  . ABDOMINAL HYSTERECTOMY      Allergies: Cymbalta [duloxetine hcl]  Medications: Prior to Admission medications   Medication Sig Start Date End Date Taking? Authorizing Provider  atorvastatin (LIPITOR) 40 MG tablet Take 40 mg  by mouth daily. 03/14/20  Yes [provider]  cholecalciferol (VITAMIN D3) 25 MCG (1000 UNIT) tablet Take 1,000 Units by mouth daily.   Yes [provider]  clonazePAM (KLONOPIN) 0.5 MG tablet Take 0.5 mg by mouth 2 (two) times daily.  04/11/20  Yes [provider]  fenofibrate 160 MG tablet Take 160 mg by mouth daily. 02/15/20  Yes [provider]  glipiZIDE (GLUCOTROL) 10 MG tablet Take 10 mg by mouth 2 (two) times daily. 12/20/19  Yes [provider]  montelukast (SINGULAIR) 10 MG tablet Take 10 mg by mouth daily. 03/14/20  Yes [provider]  omeprazole (PRILOSEC) 40 MG capsule Take 40 mg by mouth 2 (two) times daily. 02/18/20  Yes [provider]  pregabalin (LYRICA) 150 MG capsule Take 150 mg by mouth in the morning, at noon, and at bedtime.  04/15/20  Yes [provider]     History reviewed. No pertinent family history.  Social History   Socioeconomic History  . Marital status: Single    Spouse name: Not on file  . Number of children: Not on file  . Years of education: Not on file  . Highest education level: Not on file  Occupational History  . Not on file  Tobacco Use  . Smoking status: Former Smoker    Types: Cigarettes  . Smokeless tobacco: Never Used  Substance and Sexual Activity  . Alcohol use: Not Currently  . Drug use: Never  . Sexual activity: Not on file  Other Topics Concern  . Not on file  Social History Narrative  . Not on file   Social Determinants of Health   Financial Resource Strain:   . Difficulty of Paying Living Expenses: Not on file  Food  Insecurity:   . Worried About Programme researcher, broadcasting/film/video in the Last Year: Not on file  . Ran Out of Food in the Last Year: Not on file  Transportation Needs:   . Lack of Transportation (Medical): Not on file  . Lack of Transportation (Non-Medical): Not on file  Physical Activity:   . Days of Exercise per Week: Not on file  . Minutes of Exercise per  Session: Not on file  Stress:   . Feeling of Stress : Not on file  Social Connections:   . Frequency of Communication with Friends and Family: Not on file  . Frequency of Social Gatherings with Friends and Family: Not on file  . Attends Religious Services: Not on file  . Active Member of Clubs or Organizations: Not on file  . Attends Banker Meetings: Not on file  . Marital Status: Not on file     Review of Systems: A 12 point ROS discussed and pertinent positives are indicated in the HPI above.  All other systems are negative.  Review of Systems  Constitutional: Negative for chills and fever.  Respiratory: Negative for shortness of breath and wheezing.   Cardiovascular: Negative for chest pain and palpitations.  Gastrointestinal: Negative for abdominal pain.  Neurological: Negative for headaches.  Psychiatric/Behavioral: Negative for behavioral problems and confusion.    Vital Signs: BP (!) 144/81 (BP Location: Right Arm)   Pulse 85   Temp 97.7 F (36.5 C) (Oral)   Resp (!) 23   Ht 5\' 8"  (1.727 m)   Wt 261 lb 14.5 oz (118.8 kg)   SpO2 96%   BMI 39.82 kg/m   Physical Exam Vitals and nursing note reviewed.  Constitutional:      General: She is not in acute distress.    Appearance: She is ill-appearing.  Cardiovascular:     Rate and Rhythm: Normal rate and regular rhythm.     Heart sounds: Normal heart sounds. No murmur heard.   Pulmonary:     Effort: Pulmonary effort is normal. No respiratory distress.     Breath sounds: Wheezing present.     Comments: (+) right chest tube to pleure-vac with (+) air leak. Skin:    General: Skin is warm and dry.  Neurological:     Mental Status: She is alert and oriented to person, place, and time.      MD Evaluation Airway: WNL Heart: WNL Abdomen: WNL Chest/ Lungs: WNL ASA  Classification: 3 Mallampati/Airway Score: Two   Imaging: CT CHEST WO CONTRAST  Result Date: 05/08/2020 CLINICAL DATA:  Shortness  of breath and cough. EXAM: CT CHEST WITHOUT CONTRAST TECHNIQUE: Multidetector CT imaging of the chest was performed following the standard protocol without IV contrast. COMPARISON:  Chest radiograph 05/08/2020 FINDINGS: Cardiovascular: Normal heart size. No pericardial effusions. Normal caliber thoracic aorta with scattered calcification. Coronary artery calcification. Mediastinum/Nodes: Pneumomediastinum is present. Subcutaneous emphysema throughout the right chest wall and extending to the right side of the neck. No significant lymphadenopathy. Visualized lymph nodes are not pathologically enlarged. Esophagus is mostly decompressed. Lungs/Pleura: Moderately large right pneumothorax. A right chest tube is in place. Atelectasis and consolidation in the right lung most prominent in the right upper lung. Air bronchograms and emphysematous changes are present. Small bilateral pleural effusions. Emphysematous changes and scattered fibrosis in the left lung. Upper Abdomen: Cyst in the liver. No acute process demonstrated in the upper abdomen. Musculoskeletal: Degenerative changes in the spine. No destructive bone lesions. IMPRESSION: 1. Moderately large  right pneumothorax with right chest tube in place. Subcutaneous emphysema throughout the right chest wall and extending to the right side of the neck. Pneumomediastinum. 2. Atelectasis and consolidation in the right lung most prominent in the right upper lung. 3. Small bilateral pleural effusions. 4. Emphysematous changes and fibrosis in the left lung. 5. Emphysema and aortic atherosclerosis. Aortic Atherosclerosis (ICD10-I70.0) and Emphysema (ICD10-J43.9). Electronically Signed   By: Burman Nieves M.D.   On: 05/08/2020 02:15   DG CHEST PORT 1 VIEW  Result Date: 05/08/2020 CLINICAL DATA:  Pneumothorax EXAM: PORTABLE CHEST 1 VIEW COMPARISON:  November 28, 2012 FINDINGS: There is a right-sided chest tube in place. The chest tube may be kinked. There is extensive  subcutaneous gas along the patient's right chest wall and right neck. There is diffuse opacification of the right upper lung zone as well as the right lower lung zone. There is a probable small right-sided pneumothorax. No evidence for left-sided pneumothorax. The heart size is unremarkable. IMPRESSION: 1. Right-sided chest tube in place.  The chest tube may be kinked. 2. Probable small right-sided pneumothorax. 3. Diffuse opacification of the right upper lung zone and portions of the right lower lobe which may be postsurgical or posttraumatic in etiology. Alternatively, this may represent multifocal pneumonia. 4. Subcutaneous gas is noted along the patient's right chest wall and neck. Electronically Signed   By: Katherine Mantle M.D.   On: 05/08/2020 00:33    Labs:  CBC: Recent Labs    05/08/20 0016  WBC 12.2*  HGB 11.2*  HCT 36.3  PLT 129*    COAGS: No results for input(s): INR, APTT in the last 8760 hours.  BMP: Recent Labs    05/08/20 0016  NA 135  K 4.6  CL 96*  CO2 34*  GLUCOSE 282*  BUN 21*  CALCIUM 8.3*  CREATININE 0.70  GFRNONAA >60    LIVER FUNCTION TESTS: Recent Labs    05/08/20 0016  BILITOT 0.8  AST 25  ALT 34  ALKPHOS 51  PROT 4.2*  ALBUMIN 1.8*     Assessment and Plan:  Right hydropneumothorax despite right chest tube (placed at outside facility) in setting of RUL MRSA pneumonia and hx COVID-19 pneumonia approximately 2 months ago. Plan for image-guided right chest tube placement in IR tentatively for today pending IR scheduling. Patient is NPO. Afebrile. On heparin gtt for PE- will hold per Dr. Deanne Coffer.  Risks and benefits discussed with the patient including bleeding, infection, damage to adjacent structures, pneumothorax, and sepsis. All of the patient's questions were answered, patient is agreeable to proceed. Consent signed and in chart.   Thank you for this interesting consult.  I greatly enjoyed meeting Annette [name] and look  forward to participating in their care.  A copy of this report was sent to the requesting provider on this date.  Electronically Signed: Elwin Mocha, PA-C 05/08/2020, 2:29 PM   I spent a total of 40 Minutes in face to face in clinical consultation, greater than 50% of which was counseling/coordinating care for right hydropneumothorax/right chest tube placement.

## 2020-05-08 NOTE — Significant Event (Signed)
Rapid Response Event Note   Reason for Call :  Asked to come to bedside as a "second set of eyes" for newly placed chest tube/set-up. Pt alert and in no distress. R CT site WNL. CT to -20cm sx with moderately sized air leak. Please continue to monitor pt and call RRT if further assistance needed.      Terrilyn Saver, RN

## 2020-05-08 NOTE — Progress Notes (Signed)
PROGRESS NOTE  Annette [name] JKK:[id number] DOB: 11-25-1960   PCP: Patient, No Pcp Per  Patient is from: Home  DOA: 05/07/2020 LOS: 1  Brief Narrative / Interim history: 1 yr F with recent COVID-19 about 2 months ago, recent PE on Eliquis, COPD/Asthma not on oxygen, DM-2, HTN and HLD who was admitted to [facility] where she was admitted for RUL MRSA pneumonia (treated with linezolid) and RLL subsegmental PE (TTE with mod PAH but no right heart strain) treated with heparin drip, developed right-sided hydropneumothorax 2 days ago.  Underwent chest tube insertion on 05/06/2020 and stable on 3 L supplemental oxygen.  Pulmonology at [facility] spoke to CT surgeon at [facility] who recommended transfer to [facility] for VATS.   On arrival here, somewhat somnolent but hemodynamically stable.  Saturating in upper 90s on 3 L by [place].  Bicarb 34.  Glucose 282.  Albumin 1.8.  WBC 12.2 with left shift.  Platelets 129.  Otherwise, CBC and CMP without significant finding.  Pro-Cal and lactic acid negative.  Cultures obtained.  Neurosurgery consulted and recommended CT chest.  CT chest without contrast with moderately large right pneumothorax, subcutaneous emphysema throughout the right chest wall extending into right neck, pneumomediastinum, right upper lobe opacity and emphysema.  Cardiothoracic surgery consulted IR for another chest tube placement.    Subjective: Seen and examined earlier this morning.  No major events overnight of this morning.  Complaining 9/10 pain in her right chest from chest tube area.  She denies shortness of breath.  Reports intermittent cough.  Denies GI or UTI symptoms.  Objective: Vitals:   05/08/20 1550 05/08/20 1555 05/08/20 1600 05/08/20 1644  BP: 119/78 109/79 122/78 123/67  Pulse: (!) 115 (!) 111 (!) 109 (!) 101  Resp: (!) 28 (!) 32 (!) 23 (!) 23  Temp:    98.7 F (37.1 C)  TempSrc:    Oral  SpO2: 92% 93% 94% 95%  Weight:      Height:         Intake/Output Summary (Last 24 hours) at 05/08/2020 1651 Last data filed at 05/08/2020 1058 Gross per 24 hour  Intake 180 ml  Output 450 ml  Net -270 ml   Filed Weights   05/07/20 2100 05/08/20 0414  Weight: 117.9 kg 118.8 kg    Examination:  GENERAL: No apparent distress.  Nontoxic. HEENT: MMM.  Vision and hearing grossly intact.  NECK: Supple.  No apparent JVD.  RESP: 96% on 3 L.  Slightly tachypneic.  Rhonchi bilaterally.  Chest tube to right chest CVS: RRR.  Heart sounds normal.  ABD/GI/GU: BS+. Abd soft, NTND.  MSK/EXT:  Moves extremities. No apparent deformity. No edema.  SKIN: no apparent skin lesion or wound NEURO: Awake, alert and oriented appropriately.  No apparent focal neuro deficit. PSYCH: Calm. Normal affect.  Procedures:  05/06/2020-right chest tube placement at outside hospital 05/08/2020-second right chest tube placement by IR  Microbiology summarized: Blood cultures pending.  Assessment & Plan: Right upper lobe MRSA pneumonia: No fever or significant leukocytosis.  Procalcitonin and lactic acidosis negative.  I wonder if imaging could be residual from a COVID-19 infection. -Continue linezolid.  Started at outside hospital  Acute RLL subsegmental PE in patient with history of PE: TTE at outside hospital with moderate PAH but no right heart strain.  Patient had a PE -Continue IV heparin for anticoagulation -Hold home Eliquis.  Right-sided hydropneumothorax-CT chest without contrast with moderately large right pneumothorax, subcutaneous emphysema throughout the right chest  wall extending into right neck, pneumomediastinum, right upper lobe opacity and emphysema. -CTS recommended additional chest tube placement as previous chest tube is in lung fissure. -Scheduled Tylenol with as needed oxycodone and morphine for pain control. -Continue home Lyrica. -Bowel regimen  Acute hypoxemic respiratory failure: Requiring 3 L at rest. -Continuous pulse  ox -Wean oxygen as able  Chronic COPD: Stable -DuoNebs as needed  Hypertension?  Normotensive for most part.  Not on medication at home. -Continue monitoring  Hyperlipidemia -Continue Lipitor and fenofibrate  Uncontrolled noninsulin-dependent type II diabetes with hyperglycemia: A1c 10.7%. Recent Labs  Lab 05/07/20 2200 05/08/20 0108 05/08/20 0756 05/08/20 1136 05/08/20 1635  GLUCAP 287* 268* 201* 210* 193*  -Change SSI to moderate -Continue home statin.  GERD -Continue PPI  Anxiety: Stable -Continue home Klonopin  Neuropathy/chronic pain -Continue home Lyrica  History of COVID-19 infection: Treated for this about 2 months ago.  Morbid obesity: Body mass index is 39.82 kg/m. Nutrition Problem: Increased nutrient needs Etiology: wound healing, acute illness (recent COVID PNA) Signs/Symptoms: estimated needs Interventions: Refer to RD note for recommendations   Stage II pressure skin injury: POA Pressure Injury 05/07/20 Coccyx Medial;Mid Stage 2 -  Partial thickness loss of dermis presenting as a shallow open injury with a red, pink wound bed without slough. (Active)  05/07/20 2100  Location: Coccyx  Location Orientation: Medial;Mid  Staging: Stage 2 -  Partial thickness loss of dermis presenting as a shallow open injury with a red, pink wound bed without slough.  Wound Description (Comments):   Present on Admission: Yes   DVT prophylaxis:   On IV heparin for PE  Code Status: Full code Family Communication: Patient and/or RN. Available if any question.  Status is: Inpatient  Remains inpatient appropriate because:Unsafe d/c plan, IV treatments appropriate due to intensity of illness or inability to take PO and Inpatient level of care appropriate due to severity of illness   Dispo: The patient is from: Home              Anticipated d/c is to: To be determined              Anticipated d/c date is: > 3 days              Patient currently is not  medically stable to d/c.       Consultants:  Cardiothoracic surgery   Sch Meds:  Scheduled Meds: . acetaminophen  1,000 mg Oral Q8H  . atorvastatin  40 mg Oral Daily  . cholecalciferol  1,000 Units Oral Daily  . clonazePAM  0.5 mg Oral BID  . fenofibrate  160 mg Oral Daily  . fentaNYL      . [START ON 05/09/2020] insulin aspart  0-15 Units Subcutaneous TID WC  . insulin aspart  0-5 Units Subcutaneous QHS  . linezolid  600 mg Oral Q12H  . midazolam      . pantoprazole  40 mg Oral BID  . pregabalin  150 mg Oral TID   Continuous Infusions: . heparin     PRN Meds:.HYDROcodone-acetaminophen, ipratropium-albuterol, morphine injection, oxyCODONE  Antimicrobials: Anti-infectives (From admission, onward)   Start     Dose/Rate Route Frequency Ordered Stop   05/08/20 0030  linezolid (ZYVOX) tablet 600 mg        600 mg Oral Every 12 hours 05/08/20 0015         I have personally reviewed the following labs and images: CBC: Recent Labs  Lab 05/08/20 0016  WBC  12.2*  HGB 11.2*  HCT 36.3  MCV 91.0  PLT 129*   BMP &GFR Recent Labs  Lab 05/08/20 0016  NA 135  K 4.6  CL 96*  CO2 34*  GLUCOSE 282*  BUN 21*  CREATININE 0.70  CALCIUM 8.3*   Estimated Creatinine Clearance: 101.4 mL/min (by C-G formula based on SCr of 0.7 mg/dL). Liver & Pancreas: Recent Labs  Lab 05/08/20 0016  AST 25  ALT 34  ALKPHOS 51  BILITOT 0.8  PROT 4.2*  ALBUMIN 1.8*   No results for input(s): LIPASE, AMYLASE in the last 168 hours. No results for input(s): AMMONIA in the last 168 hours. Diabetic: Recent Labs    05/08/20 0016  HGBA1C 10.7*   Recent Labs  Lab 05/07/20 2200 05/08/20 0108 05/08/20 0756 05/08/20 1136 05/08/20 1635  GLUCAP 287* 268* 201* 210* 193*   Cardiac Enzymes: No results for input(s): CKTOTAL, CKMB, CKMBINDEX, TROPONINI in the last 168 hours. No results for input(s): PROBNP in the last 8760 hours. Coagulation Profile: No results for input(s): INR,  PROTIME in the last 168 hours. Thyroid Function Tests: No results for input(s): TSH, T4TOTAL, FREET4, T3FREE, THYROIDAB in the last 72 hours. Lipid Profile: No results for input(s): CHOL, HDL, LDLCALC, TRIG, CHOLHDL, LDLDIRECT in the last 72 hours. Anemia Panel: No results for input(s): VITAMINB12, FOLATE, FERRITIN, TIBC, IRON, RETICCTPCT in the last 72 hours. Urine analysis: No results found for: COLORURINE, APPEARANCEUR, LABSPEC, PHURINE, GLUCOSEU, HGBUR, BILIRUBINUR, KETONESUR, PROTEINUR, UROBILINOGEN, NITRITE, LEUKOCYTESUR Sepsis Labs: Invalid input(s): PROCALCITONIN, LACTICIDVEN  Microbiology: No results found for this or any previous visit (from the past 240 hour(s)).  Radiology Studies: CT CHEST WO CONTRAST  Result Date: 05/08/2020 CLINICAL DATA:  Shortness of breath and cough. EXAM: CT CHEST WITHOUT CONTRAST TECHNIQUE: Multidetector CT imaging of the chest was performed following the standard protocol without IV contrast. COMPARISON:  Chest radiograph 05/08/2020 FINDINGS: Cardiovascular: Normal heart size. No pericardial effusions. Normal caliber thoracic aorta with scattered calcification. Coronary artery calcification. Mediastinum/Nodes: Pneumomediastinum is present. Subcutaneous emphysema throughout the right chest wall and extending to the right side of the neck. No significant lymphadenopathy. Visualized lymph nodes are not pathologically enlarged. Esophagus is mostly decompressed. Lungs/Pleura: Moderately large right pneumothorax. A right chest tube is in place. Atelectasis and consolidation in the right lung most prominent in the right upper lung. Air bronchograms and emphysematous changes are present. Small bilateral pleural effusions. Emphysematous changes and scattered fibrosis in the left lung. Upper Abdomen: Cyst in the liver. No acute process demonstrated in the upper abdomen. Musculoskeletal: Degenerative changes in the spine. No destructive bone lesions. IMPRESSION: 1.  Moderately large right pneumothorax with right chest tube in place. Subcutaneous emphysema throughout the right chest wall and extending to the right side of the neck. Pneumomediastinum. 2. Atelectasis and consolidation in the right lung most prominent in the right upper lung. 3. Small bilateral pleural effusions. 4. Emphysematous changes and fibrosis in the left lung. 5. Emphysema and aortic atherosclerosis. Aortic Atherosclerosis (ICD10-I70.0) and Emphysema (ICD10-J43.9). Electronically Signed   By: Burman Nieves M.D.   On: 05/08/2020 02:15   DG CHEST PORT 1 VIEW  Result Date: 05/08/2020 CLINICAL DATA:  Pneumothorax EXAM: PORTABLE CHEST 1 VIEW COMPARISON:  November 28, 2012 FINDINGS: There is a right-sided chest tube in place. The chest tube may be kinked. There is extensive subcutaneous gas along the patient's right chest wall and right neck. There is diffuse opacification of the right upper lung zone as well as the right lower  lung zone. There is a probable small right-sided pneumothorax. No evidence for left-sided pneumothorax. The heart size is unremarkable. IMPRESSION: 1. Right-sided chest tube in place.  The chest tube may be kinked. 2. Probable small right-sided pneumothorax. 3. Diffuse opacification of the right upper lung zone and portions of the right lower lobe which may be postsurgical or posttraumatic in etiology. Alternatively, this may represent multifocal pneumonia. 4. Subcutaneous gas is noted along the patient's right chest wall and neck. Electronically Signed   By: Katherine Mantle M.D.   On: 05/08/2020 00:33     Kiante Ciavarella T. Marlow Hendrie [facility]  If 7PM-7AM, please contact night-coverage [email or web address] 05/08/2020, 4:51 PM

## 2020-05-08 NOTE — Progress Notes (Signed)
Initial Nutrition Assessment  DOCUMENTATION CODES:   Obesity unspecified  INTERVENTION:    When diet is advanced, start Ensure Enlive po TID, each supplement provides 350 kcal and 20 grams of protein.  If unable to advance diet within the next 48 hours or if PO intake is inadequate when diet is advanced, consider Cortrak placement for enteral nutrition supplementation.  NUTRITION DIAGNOSIS:   Increased nutrient needs related to wound healing, acute illness (recent COVID PNA) as evidenced by estimated needs.  GOAL:   Patient will meet greater than or equal to 90% of their needs  MONITOR:   Diet advancement, PO intake  REASON FOR ASSESSMENT:   Malnutrition Screening Tool    ASSESSMENT:   59 yo female admitted from [facility] for treatment of a complicated pneumothorax in the setting of MRSA PNA. PMH includes COVID PNA 2 months PTA, recent PE, COPD, HTN, DM, asthma.   Chest tube in place, likely in the fissure. Another chest tube being placed today.    On admission malnutrition screening tool, patient reported recent weight loss > 14 lbs with decreased appetite and poor intake. No weight history available for review.  Suspect intake has been poor d/t symptoms from recent COVID PNA.  Currently NPO.   She has a stage 2 pressure injury documented on admission.   Labs reviewed. A1C 10.7 CBG: [id number]  Medications reviewed and include cholecalciferol, novolog.   Diet Order:   Diet Order            Diet NPO time specified Except for: Sips with Meds  Diet effective now                 EDUCATION NEEDS:   Not appropriate for education at this time  Skin:  Skin Assessment: Skin Integrity Issues: Skin Integrity Issues:: Stage II Stage II: coccyx  Last BM:  no BM documented  Height:   Ht Readings from Last 1 Encounters:  05/07/20 5\' 8"  (1.727 m)    Weight:   Wt Readings from Last 1 Encounters:  05/08/20 118.8 kg    Ideal Body  Weight:  63.6 kg  BMI:  Body mass index is 39.82 kg/m.  Estimated Nutritional Needs:   Kcal:  2300-2500  Protein:  125-150 gm  Fluid:  >/= 2 L    05/10/20, RD, LDN, CNSC Please refer to Amion for contact information.

## 2020-05-09 ENCOUNTER — Inpatient Hospital Stay (HOSPITAL_COMMUNITY): Payer: Medicare Other

## 2020-05-09 DIAGNOSIS — J9601 Acute respiratory failure with hypoxia: Secondary | ICD-10-CM | POA: Diagnosis not present

## 2020-05-09 DIAGNOSIS — J9311 Primary spontaneous pneumothorax: Secondary | ICD-10-CM | POA: Diagnosis not present

## 2020-05-09 DIAGNOSIS — J15212 Pneumonia due to Methicillin resistant Staphylococcus aureus: Secondary | ICD-10-CM | POA: Diagnosis not present

## 2020-05-09 DIAGNOSIS — I2693 Single subsegmental pulmonary embolism without acute cor pulmonale: Secondary | ICD-10-CM | POA: Diagnosis not present

## 2020-05-09 LAB — RENAL FUNCTION PANEL
Albumin: 1.5 g/dL — ABNORMAL LOW (ref 3.5–5.0)
Anion gap: 8 (ref 5–15)
BUN: 23 mg/dL — ABNORMAL HIGH (ref 6–20)
CO2: 30 mmol/L (ref 22–32)
Calcium: 7.4 mg/dL — ABNORMAL LOW (ref 8.9–10.3)
Chloride: 96 mmol/L — ABNORMAL LOW (ref 98–111)
Creatinine, Ser: 0.69 mg/dL (ref 0.44–1.00)
GFR, Estimated: >60 mL/min (ref >60)
Glucose, Bld: 349 mg/dL — ABNORMAL HIGH (ref 70–99)
Phosphorus: 3.3 mg/dL (ref 2.5–4.6)
Potassium: 4.5 mmol/L (ref 3.5–5.1)
Sodium: 134 mmol/L — ABNORMAL LOW (ref 135–145)

## 2020-05-09 LAB — CBC
HCT: 36 % (ref 36.0–46.0)
Hemoglobin: 11.2 g/dL — ABNORMAL LOW (ref 12.0–15.0)
MCH: 28.6 pg (ref 26.0–34.0)
MCHC: 31.1 g/dL (ref 30.0–36.0)
MCV: 91.8 fL (ref 80.0–100.0)
Platelets: 128 10*3/uL — ABNORMAL LOW (ref 150–400)
RBC: 3.92 MIL/uL (ref 3.87–5.11)
RDW: 16.1 % — ABNORMAL HIGH (ref 11.5–15.5)
WBC: 19.8 10*3/uL — ABNORMAL HIGH (ref 4.0–10.5)
nRBC: 0 % (ref 0.0–0.2)

## 2020-05-09 LAB — GLUCOSE, CAPILLARY
Glucose-Capillary: 289 mg/dL — ABNORMAL HIGH (ref 70–99)
Glucose-Capillary: 291 mg/dL — ABNORMAL HIGH (ref 70–99)
Glucose-Capillary: 298 mg/dL — ABNORMAL HIGH (ref 70–99)
Glucose-Capillary: 321 mg/dL — ABNORMAL HIGH (ref 70–99)

## 2020-05-09 LAB — HEPARIN LEVEL (UNFRACTIONATED)
Heparin Unfractionated: >2.2 IU/mL — ABNORMAL HIGH (ref 0.30–0.70)
Heparin Unfractionated: 0.55 IU/mL (ref 0.30–0.70)
Heparin Unfractionated: 0.5 IU/mL (ref 0.30–0.70)

## 2020-05-09 LAB — MAGNESIUM: Magnesium: 1.7 mg/dL (ref 1.7–2.4)

## 2020-05-09 MED ORDER — INSULIN GLARGINE 100 UNIT/ML ~~LOC~~ SOLN: 15.0000 [IU] | Freq: Two times a day (BID) | SUBCUTANEOUS | Status: DC

## 2020-05-09 MED ORDER — INSULIN ASPART 100 UNIT/ML ~~LOC~~ SOLN
0.0000 [IU] | Freq: Three times a day (TID) | SUBCUTANEOUS | Status: DC
  Administered 2020-05-09: 8 [IU] via SUBCUTANEOUS
  Administered 2020-05-10: 5 [IU] via SUBCUTANEOUS
  Administered 2020-05-10: 8 [IU] via SUBCUTANEOUS
  Administered 2020-05-10: 5 [IU] via SUBCUTANEOUS
  Administered 2020-05-11 (×3): 3 [IU] via SUBCUTANEOUS
  Administered 2020-05-12: 2 [IU] via SUBCUTANEOUS

## 2020-05-09 MED ORDER — INSULIN GLARGINE 100 UNIT/ML ~~LOC~~ SOLN
20.0000 [IU] | Freq: Every day | SUBCUTANEOUS | Status: DC
  Administered 2020-05-09: 20 [IU] via SUBCUTANEOUS
  Filled 2020-05-09 (×3): qty 0.2

## 2020-05-09 MED ORDER — INSULIN ASPART 100 UNIT/ML ~~LOC~~ SOLN
0.0000 [IU] | Freq: Every day | SUBCUTANEOUS | Status: DC
  Administered 2020-05-09: 4 [IU] via SUBCUTANEOUS
  Administered 2020-05-10: 3 [IU] via SUBCUTANEOUS
  Administered 2020-05-11: 2 [IU] via SUBCUTANEOUS

## 2020-05-09 MED ORDER — INSULIN ASPART 100 UNIT/ML ~~LOC~~ SOLN
4.0000 [IU] | Freq: Three times a day (TID) | SUBCUTANEOUS | Status: DC
  Administered 2020-05-09 – 2020-05-10 (×3): 4 [IU] via SUBCUTANEOUS

## 2020-05-09 NOTE — Progress Notes (Signed)
      [address]       [place] [address]             [id number]      Subjective:  Patient doing okay.  Pain is controlled.  Objective: Vital signs in last 24 hours: Temp:  [98 F (36.7 C)-98.7 F (37.1 C)] 98 F (36.7 C) (11/25 0757) Pulse Rate:  [77-115] 90 (11/25 0757) Cardiac Rhythm: Normal sinus rhythm;Bundle branch block (11/25 0700) Resp:  [20-32] 20 (11/25 0757) BP: (104-141)/(54-86) 122/77 (11/25 0757) SpO2:  [91 %-97 %] 96 % (11/25 0757) Weight:  [113.7 kg] 113.7 kg (11/25 0526)  Intake/Output from previous day: 11/24 0701 - 11/25 0700 In: 328.5 [P.O.:180; I.V.:148.5] Out: 1175 [Urine:400; Chest Tube:775]  General appearance: alert, cooperative and no distress Heart: regular rate and rhythm Lungs: rhonchi bilaterally  Lab Results: Recent Labs    05/08/20 0016 05/09/20 0011  WBC 12.2* 19.8*  HGB 11.2* 11.2*  HCT 36.3 36.0  PLT 129* 128*   BMET:  Recent Labs    05/08/20 0016 05/09/20 0011  NA 135 134*  K 4.6 4.5  CL 96* 96*  CO2 34* 30  GLUCOSE 282* 349*  BUN 21* 23*  CREATININE 0.70 0.69  CALCIUM 8.3* 7.4*    PT/INR: No results for input(s): LABPROT, INR in the last 72 hours. ABG No results found for: PHART, HCO3, TCO2, ACIDBASEDEF, O2SAT CBG (last 3)  Recent Labs    05/08/20 1635 05/08/20 2158 05/09/20 0755  GLUCAP 193* 340* 289*    Assessment/Plan:  1. S/P COVID Pneumonia with Right sided Hydropnuemothorax-- IR has placed pigtail, difficult to tell on CXR if improved with overlying sub q emphysema.. There is an air leak present from her Pleurovac.. leave chest tubes in place on suction, repeat CXR in AM... Care per primary   LOS: 2 days    Lowella Dandy, PA-C 05/09/2020

## 2020-05-09 NOTE — Progress Notes (Signed)
PROGRESS NOTE  Annette [name] DZH:[id number] DOB: Dec 09, 1960   PCP: Patient, No Pcp Per  Patient is from: Home  DOA: 05/07/2020 LOS: 2  Brief Narrative / Interim history: 21 yr F with recent COVID-19 about 2 months ago, recent PE on Eliquis, COPD/Asthma not on oxygen, DM-2, HTN and HLD who was admitted to [facility] on 05/03/2020 where she was admitted for RUL MRSA pneumonia and RLL subsegmental PE (TTE with mod PAH but no right heart strain) treated with vancomycin, then Zyvox,  and Lovenox.  She developed right-sided hydropneumothorax on 11/22 underwent chest tube insertion on 05/06/2020 and transferred to [facility] for CTS evaluation and treatment.   On arrival here, somewhat somnolent but hemodynamically stable.  Saturating in upper 90s on 3 L by [place].  Bicarb 34.  Glucose 282.  Albumin 1.8.  WBC 12.2 with left shift.  Platelets 129.  Otherwise, CBC and CMP without significant finding.  Pro-Cal and lactic acid negative.  Cultures obtained.  Neurosurgery consulted and recommended CT chest.  CT chest without contrast with moderately large right pneumothorax, subcutaneous emphysema throughout the right chest wall extending into right neck, pneumomediastinum, right upper lobe opacity and emphysema.  IR consulted by CTS and placed additional chest tube to right chest on 05/08/2020.  CTS following.   Subjective: Seen and examined earlier this morning.  No major events overnight of this morning.  Pain fairly controlled unless she moves around.  Denies shortness of breath.  Reports intermittent cough.  Denies GI or UTI symptoms.  Objective: Vitals:   05/09/20 0017 05/09/20 0526 05/09/20 0757 05/09/20 1207  BP: 104/70 140/76 122/77 119/64  Pulse: 77 94 90 90  Resp: 20 (!) 25 20 20   Temp: 98 F (36.7 C) 98 F (36.7 C) 98 F (36.7 C) 98.3 F (36.8 C)  TempSrc: Oral Oral Oral Oral  SpO2: 97% 97% 96% 95%  Weight:  113.7 kg    Height:        Intake/Output Summary (Last  24 hours) at 05/09/2020 1401 Last data filed at 05/09/2020 0534 Gross per 24 hour  Intake 148.5 ml  Output 975 ml  Net -826.5 ml   Filed Weights   05/07/20 2100 05/08/20 0414 05/09/20 0526  Weight: 117.9 kg 118.8 kg 113.7 kg    Examination:  GENERAL: No apparent distress.  Nontoxic. HEENT: MMM.  Vision and hearing grossly intact.  NECK: Supple.  No apparent JVD.  RESP: 97% on 4 L.  Slightly tachypneic.  Rhonchi bilaterally.  Chest tubes in right chest CVS:  RRR. Heart sounds normal.  ABD/GI/GU: BS+. Abd soft, NTND.  MSK/EXT:  Moves extremities. No apparent deformity. No edema.  SKIN: no apparent skin lesion or wound NEURO: Awake and oriented appropriately.  No apparent focal neuro deficit. PSYCH: Calm. Normal affect.  Procedures:  05/06/2020-right chest tube placement at outside hospital 05/08/2020-second right chest tube placement by IR  Microbiology summarized: Blood cultures pending.  Assessment & Plan: Right upper lobe MRSA pneumonia: No fever but uptrending leukocytosis.  Procalcitonin and lactic acidosis negative.  I wonder if imaging could be residual from a COVID-19 infection.  CXR with extensive RUL consolidation and additional bilateral lung opacities.  -Vancomycin 11/19-11/21>> linezolid 11/21>> -Recheck procalcitonin.  Acute RLL subsegmental PE in patient with history of PE: TTE at outside hospital with moderate PAH but no right heart strain.  Patient had a PE -Continue IV heparin for anticoagulation -Hold home Eliquis.  Right-sided hydropneumothorax-CT chest without contrast with moderately large right pneumothorax,  subcutaneous emphysema throughout the right chest wall extending into right neck, pneumomediastinum, right upper lobe opacity and emphysema.  CXR with minimal residual right lateral mid chest pneumothorax but extensive RUL consolidation with additional atelectasis and bilateral lung opacities -CTS following. -Additional chest tube to right chest  by IR on 11/24 -Scheduled Tylenol with as needed oxycodone and morphine for pain control. -Daily chest x-ray -Continue home Lyrica. -Bowel regimen  Acute hypoxemic respiratory failure: Requiring 4 L at rest -Continuous pulse ox -Wean oxygen as able  Chronic COPD: Stable -DuoNebs as needed  Hypertension?  Normotensive for most part.  Not on medication at home. -Continue monitoring  Hyperlipidemia -Continue Lipitor and fenofibrate  Uncontrolled noninsulin-dependent type II diabetes with hyperglycemia: A1c 10.7%. Recent Labs  Lab 05/08/20 1136 05/08/20 1635 05/08/20 2158 05/09/20 0755 05/09/20 1309  GLUCAP 210* 193* 340* 289* 291*  -Continue SSI-moderate -Add mealtime coverage at 4 units -Add Lantus 15 units twice daily starting tonight -Continue home statin.  GERD -Continue PPI  Anxiety: Stable -Continue home Klonopin  Neuropathy/chronic pain -Continue home Lyrica  History of COVID-19 infection: Treated for this about 2 months ago.  Leukocytosis/bandemia: Likely due to #1 -Continue monitoring  Morbid obesity: Body mass index is 38.11 kg/m. Nutrition Problem: Increased nutrient needs Etiology: wound healing, acute illness (recent COVID PNA) Signs/Symptoms: estimated needs Interventions: Refer to RD note for recommendations   Stage II pressure skin injury: POA Pressure Injury 05/07/20 Coccyx Medial;Mid Stage 2 -  Partial thickness loss of dermis presenting as a shallow open injury with a red, pink wound bed without slough. (Active)  05/07/20 2100  Location: Coccyx  Location Orientation: Medial;Mid  Staging: Stage 2 -  Partial thickness loss of dermis presenting as a shallow open injury with a red, pink wound bed without slough.  Wound Description (Comments):   Present on Admission: Yes   DVT prophylaxis:   On IV heparin for PE  Code Status: Full code Family Communication: Patient and/or RN. Available if any question.  Status is:  Inpatient  Remains inpatient appropriate because:Unsafe d/c plan, IV treatments appropriate due to intensity of illness or inability to take PO and Inpatient level of care appropriate due to severity of illness   Dispo: The patient is from: Home              Anticipated d/c is to: To be determined              Anticipated d/c date is: > 3 days              Patient currently is not medically stable to d/c.       Consultants:  Cardiothoracic surgery   Sch Meds:  Scheduled Meds: . acetaminophen  1,000 mg Oral Q8H  . atorvastatin  40 mg Oral Daily  . cholecalciferol  1,000 Units Oral Daily  . clonazePAM  0.5 mg Oral BID  . fenofibrate  160 mg Oral Daily  . insulin aspart  0-15 Units Subcutaneous TID WC  . insulin aspart  0-5 Units Subcutaneous QHS  . linezolid  600 mg Oral Q12H  . pantoprazole  40 mg Oral BID  . pregabalin  150 mg Oral TID   Continuous Infusions: . heparin 1,800 Units/hr (05/09/20 0538)   PRN Meds:.influenza vac split quadrivalent PF, ipratropium-albuterol, morphine injection, oxyCODONE, pneumococcal 23 valent vaccine  Antimicrobials: Anti-infectives (From admission, onward)   Start     Dose/Rate Route Frequency Ordered Stop   05/08/20 0030  linezolid (ZYVOX) tablet 600 mg  600 mg Oral Every 12 hours 05/08/20 0015         I have personally reviewed the following labs and images: CBC: Recent Labs  Lab 05/08/20 0016 05/09/20 0011  WBC 12.2* 19.8*  HGB 11.2* 11.2*  HCT 36.3 36.0  MCV 91.0 91.8  PLT 129* 128*   BMP &GFR Recent Labs  Lab 05/08/20 0016 05/09/20 0011  NA 135 134*  K 4.6 4.5  CL 96* 96*  CO2 34* 30  GLUCOSE 282* 349*  BUN 21* 23*  CREATININE 0.70 0.69  CALCIUM 8.3* 7.4*  MG  --  1.7  PHOS  --  3.3   Estimated Creatinine Clearance: 98.9 mL/min (by C-G formula based on SCr of 0.69 mg/dL). Liver & Pancreas: Recent Labs  Lab 05/08/20 0016 05/09/20 0011  AST 25  --   ALT 34  --   ALKPHOS 51  --   BILITOT 0.8  --    PROT 4.2*  --   ALBUMIN 1.8* 1.5*   No results for input(s): LIPASE, AMYLASE in the last 168 hours. No results for input(s): AMMONIA in the last 168 hours. Diabetic: Recent Labs    05/08/20 0016  HGBA1C 10.7*   Recent Labs  Lab 05/08/20 1136 05/08/20 1635 05/08/20 2158 05/09/20 0755 05/09/20 1309  GLUCAP 210* 193* 340* 289* 291*   Cardiac Enzymes: No results for input(s): CKTOTAL, CKMB, CKMBINDEX, TROPONINI in the last 168 hours. No results for input(s): PROBNP in the last 8760 hours. Coagulation Profile: No results for input(s): INR, PROTIME in the last 168 hours. Thyroid Function Tests: No results for input(s): TSH, T4TOTAL, FREET4, T3FREE, THYROIDAB in the last 72 hours. Lipid Profile: No results for input(s): CHOL, HDL, LDLCALC, TRIG, CHOLHDL, LDLDIRECT in the last 72 hours. Anemia Panel: No results for input(s): VITAMINB12, FOLATE, FERRITIN, TIBC, IRON, RETICCTPCT in the last 72 hours. Urine analysis: No results found for: COLORURINE, APPEARANCEUR, LABSPEC, PHURINE, GLUCOSEU, HGBUR, BILIRUBINUR, KETONESUR, PROTEINUR, UROBILINOGEN, NITRITE, LEUKOCYTESUR Sepsis Labs: Invalid input(s): PROCALCITONIN, LACTICIDVEN  Microbiology: Recent Results (from the past 240 hour(s))  Culture, blood (routine x 2)     Status: None (Preliminary result)   Collection Time: 05/08/20 12:16 AM   Specimen: BLOOD  Result Value Ref Range Status   Specimen Description BLOOD LEFT HAND  Final   Special Requests   Final    BOTTLES DRAWN AEROBIC AND ANAEROBIC Blood Culture results may not be optimal due to an excessive volume of blood received in culture bottles   Culture   Final    NO GROWTH 1 DAY Performed at [facility], [address], [place], [place] [address]    Report Status PENDING  Incomplete  Culture, blood (routine x 2)     Status: None (Preliminary result)   Collection Time: 05/08/20 12:16 AM   Specimen: BLOOD  Result Value Ref Range Status   Specimen Description  BLOOD RIGHT ARM  Final   Special Requests   Final    BOTTLES DRAWN AEROBIC AND ANAEROBIC Blood Culture results may not be optimal due to an excessive volume of blood received in culture bottles   Culture   Final    NO GROWTH 1 DAY Performed at [facility], [address] [place], [place] [address]    Report Status PENDING  Incomplete    Radiology Studies: DG Chest Port 1 View  Result Date: 05/09/2020 CLINICAL DATA:  Shortness of breath, MRSA pneumonia, RIGHT-side chest tube for small RIGHT pneumothorax EXAM: PORTABLE CHEST  1 VIEW COMPARISON:  Portable exam 0827 hours compared to 05/08/2020 FINDINGS: RIGHT thoracostomy tubes unchanged. Normal heart size, mediastinal contours, and pulmonary vascularity. Persistent consolidation RIGHT upper lobe with minimal atelectasis versus infiltrate at lung bases bilaterally greater on RIGHT. Persistent small loculated lateral RIGHT mid chest pneumothorax. Extensive RIGHT chest wall emphysema extending into RIGHT cervical region. No pleural effusion. IMPRESSION: RIGHT thoracostomy tubes with minimal residual RIGHT lateral mid chest pneumothorax. Extensive RIGHT upper lobe consolidation with additional atelectasis versus infiltrate at lung bases RIGHT greater than LEFT. Electronically Signed   By: Ulyses Southward M.D.   On: 05/09/2020 10:19   CT IMAGE GUIDED DRAINAGE BY PERCUTANEOUS CATHETER  Result Date: 05/08/2020 CLINICAL DATA:  History of COVID pneumonia 2 months ago, right upper lobe MRSA pneumonia, right lower lobe pulmonary emboli, complex right hydropneumothorax, incompletely controlled with surgical chest tube. Subcutaneous emphysema and pneumomediastinum. Additional chest drain requested. EXAM: CT GUIDED CHEST DRAIN PLACEMENT ANESTHESIA/SEDATION: Intravenous Fentanyl and Versed 1.5mg  were administered as conscious sedation during continuous monitoring of the patient's level of consciousness and physiological / cardiorespiratory status by the  radiology RN, with a total moderate sedation time of 18 minutes. PROCEDURE: The procedure, risks, benefits, and alternatives were explained to the patient. Questions regarding the procedure were encouraged and answered. The patient understands and consents to the procedure. Select axial scans through the chest were obtained. The anterior pneumothorax was localized and an appropriate skin entry site was determined and marked. The operative field was prepped with chlorhexidinein a sterile fashion, and a sterile drape was applied covering the operative field. A sterile gown and sterile gloves were used for the procedure. Local anesthesia was provided with 1% Lidocaine. Under CT fluoroscopic guidance, a percutaneous entry needle advanced into the anterior component of the right pneumothorax. Guidewire was advanced apically. Tract dilated to facilitate placement of a 14 French pigtail catheter, directed towards the apex. Catheter was connected to Pleur-evac suction -20 cm H2O. Follow-up scan shows good position of chest tube, partial evacuation of loculated anterior component, no immediate complication. Catheter secured externally with 0 Prolene suture and StatLock and covered with Vaseline gauze and sterile gauze dressing. The patient tolerated the procedure well. COMPLICATIONS: None immediate FINDINGS: Anterior right pneumothorax was localized. 14 pigtail drain catheter placed, directed apically. IMPRESSION: Technically successful CT-guided right chest tube placement. Electronically Signed   By: Corlis Leak M.D.   On: 05/08/2020 17:01     Derel Mcglasson T. Jaleigh Mccroskey [facility]  If 7PM-7AM, please contact night-coverage [email or web address] 05/09/2020, 2:01 PM

## 2020-05-09 NOTE — Progress Notes (Signed)
[facility] chest tube cannister changed to [place]. Connection resecured with silk tape. Chest tube with small air leak-unchanged from prior. Chest tube continues to drain serosang drainage. Pt tolerated cannister change well. Please call RRT if any assistance needed.

## 2020-05-09 NOTE — Progress Notes (Signed)
ANTICOAGULATION CONSULT NOTE  Pharmacy Consult for Heparin  Indication: pulmonary embolus  Allergies  Allergen Reactions  . Cymbalta [Duloxetine Hcl]     Psychosis "self-harm"   Patient Measurements: Height: 5\' 8"  (172.7 cm) Weight: 113.7 kg (250 lb 10.6 oz) IBW/kg (Calculated) : 63.9  Vital Signs: Temp: 98 F (36.7 C) (11/25 0757) Temp Source: Oral (11/25 0757) BP: 122/77 (11/25 0757) Pulse Rate: 90 (11/25 0757)  Assessment: 59 y/o F transfer from [facility]. Pt has PE and had been on Apixaban recently (last dose not clear) and was transferred on heparin.   Pt is s/p chest tube placement and IV heparin was resumed for PE on 11/24.  Today's confirmatory heparin level is therapeutic at 0.50 on 1800 units/hour. Hgb stable at 11.2, platelets low but stable at 128. No bleeding reported.  Goal of Therapy:  Heparin level 0.3-0.7 units/ml Monitor platelets by anticoagulation protocol: Yes   Plan:  Continue heparin 1800 units/hr Daily heparin level, CBC Monitor for s/sx bleeding  12/24, PharmD PGY-1 Pharmacy Resident 05/09/2020 12:49 PM Please see AMION for all pharmacy numbers

## 2020-05-09 NOTE — Progress Notes (Signed)
ANTICOAGULATION CONSULT NOTE  Pharmacy Consult for Heparin  Indication: pulmonary embolus  Assessment: 59 y/o F transfer from [facility]. Pt has PE and had been on Apixaban recently (last dose not clear) and was transferred on heparin.    Heparin level initially reported as >2.2, redraw heparin level 0.55 units/ml  Goal of Therapy:  Heparin level 0.3-0.7 units/ml Monitor platelets by anticoagulation protocol: Yes   Plan:  -Continue heparin 1800 units/hr -Heparin level in 6 hours and daily wth CBC daily  Thanks for allowing pharmacy to be a part of this patient's care.  Talbert Cage, PharmD Clinical Pharmacist 05/09/2020 5:23 AM

## 2020-05-10 ENCOUNTER — Inpatient Hospital Stay (HOSPITAL_COMMUNITY): Payer: Medicare Other

## 2020-05-10 DIAGNOSIS — D696 Thrombocytopenia, unspecified: Secondary | ICD-10-CM

## 2020-05-10 DIAGNOSIS — L304 Erythema intertrigo: Secondary | ICD-10-CM

## 2020-05-10 DIAGNOSIS — J9311 Primary spontaneous pneumothorax: Secondary | ICD-10-CM | POA: Diagnosis not present

## 2020-05-10 DIAGNOSIS — J15212 Pneumonia due to Methicillin resistant Staphylococcus aureus: Secondary | ICD-10-CM | POA: Diagnosis not present

## 2020-05-10 DIAGNOSIS — J9601 Acute respiratory failure with hypoxia: Secondary | ICD-10-CM | POA: Diagnosis not present

## 2020-05-10 DIAGNOSIS — I2693 Single subsegmental pulmonary embolism without acute cor pulmonale: Secondary | ICD-10-CM | POA: Diagnosis not present

## 2020-05-10 LAB — CBC WITH DIFFERENTIAL/PLATELET
Abs Immature Granulocytes: 0.09 10*3/uL — ABNORMAL HIGH (ref 0.00–0.07)
Basophils Absolute: 0 10*3/uL (ref 0.0–0.1)
Basophils Relative: 0 %
Eosinophils Absolute: 0.2 10*3/uL (ref 0.0–0.5)
Eosinophils Relative: 1 %
HCT: 35.8 % — ABNORMAL LOW (ref 36.0–46.0)
Hemoglobin: 11 g/dL — ABNORMAL LOW (ref 12.0–15.0)
Immature Granulocytes: 1 %
Lymphocytes Relative: 7 %
Lymphs Abs: 1.1 10*3/uL (ref 0.7–4.0)
MCH: 28.6 pg (ref 26.0–34.0)
MCHC: 30.7 g/dL (ref 30.0–36.0)
MCV: 93 fL (ref 80.0–100.0)
Monocytes Absolute: 0.6 10*3/uL (ref 0.1–1.0)
Monocytes Relative: 4 %
Neutro Abs: 13.8 10*3/uL — ABNORMAL HIGH (ref 1.7–7.7)
Neutrophils Relative %: 87 %
Platelets: 126 10*3/uL — ABNORMAL LOW (ref 150–400)
RBC: 3.85 MIL/uL — ABNORMAL LOW (ref 3.87–5.11)
RDW: 16.1 % — ABNORMAL HIGH (ref 11.5–15.5)
WBC: 15.8 10*3/uL — ABNORMAL HIGH (ref 4.0–10.5)
nRBC: 0 % (ref 0.0–0.2)

## 2020-05-10 LAB — RENAL FUNCTION PANEL
Albumin: 1.4 g/dL — ABNORMAL LOW (ref 3.5–5.0)
Anion gap: 4 — ABNORMAL LOW (ref 5–15)
BUN: 19 mg/dL (ref 6–20)
CO2: 33 mmol/L — ABNORMAL HIGH (ref 22–32)
Calcium: 8 mg/dL — ABNORMAL LOW (ref 8.9–10.3)
Chloride: 100 mmol/L (ref 98–111)
Creatinine, Ser: 0.7 mg/dL (ref 0.44–1.00)
GFR, Estimated: >60 mL/min (ref >60)
Glucose, Bld: 255 mg/dL — ABNORMAL HIGH (ref 70–99)
Phosphorus: 2.6 mg/dL (ref 2.5–4.6)
Potassium: 4.7 mmol/L (ref 3.5–5.1)
Sodium: 137 mmol/L (ref 135–145)

## 2020-05-10 LAB — HEMOGLOBIN AND HEMATOCRIT, BLOOD
HCT: 26 % — ABNORMAL LOW (ref 36.0–46.0)
HCT: 25.4 % — ABNORMAL LOW (ref 36.0–46.0)
Hemoglobin: 8 g/dL — ABNORMAL LOW (ref 12.0–15.0)
Hemoglobin: 7.7 g/dL — ABNORMAL LOW (ref 12.0–15.0)

## 2020-05-10 LAB — GLUCOSE, CAPILLARY
Glucose-Capillary: 221 mg/dL — ABNORMAL HIGH (ref 70–99)
Glucose-Capillary: 238 mg/dL — ABNORMAL HIGH (ref 70–99)
Glucose-Capillary: 274 mg/dL — ABNORMAL HIGH (ref 70–99)
Glucose-Capillary: 273 mg/dL — ABNORMAL HIGH (ref 70–99)

## 2020-05-10 LAB — HEPARIN LEVEL (UNFRACTIONATED): Heparin Unfractionated: 0.35 IU/mL (ref 0.30–0.70)

## 2020-05-10 LAB — PREPARE RBC (CROSSMATCH)

## 2020-05-10 LAB — ABO/RH: ABO/RH(D): A POS

## 2020-05-10 LAB — MAGNESIUM: Magnesium: 1.8 mg/dL (ref 1.7–2.4)

## 2020-05-10 LAB — PROCALCITONIN: Procalcitonin: 0.18 ng/mL

## 2020-05-10 MED ORDER — ACETAMINOPHEN 325 MG PO TABS: 650.0000 mg | ORAL_TABLET | Freq: Once | ORAL | Status: DC

## 2020-05-10 MED ORDER — ENSURE MAX PROTEIN PO LIQD
11.0000 [oz_av] | Freq: Two times a day (BID) | ORAL | Status: DC
  Administered 2020-05-10: 8 [oz_av] via ORAL
  Administered 2020-05-11 – 2020-05-15 (×3): 11 [oz_av] via ORAL
  Filled 2020-05-10 (×12): qty 330

## 2020-05-10 MED ORDER — SODIUM CHLORIDE 0.9 % IV SOLN
3.0000 g | Freq: Four times a day (QID) | INTRAVENOUS | Status: AC
  Administered 2020-05-10 – 2020-05-17 (×27): 3 g via INTRAVENOUS
  Filled 2020-05-10 (×3): qty 8
  Filled 2020-05-10 (×8): qty 3
  Filled 2020-05-10 (×3): qty 8
  Filled 2020-05-10: qty 3
  Filled 2020-05-10 (×2): qty 8
  Filled 2020-05-10 (×2): qty 3
  Filled 2020-05-10: qty 8
  Filled 2020-05-10: qty 3
  Filled 2020-05-10: qty 8
  Filled 2020-05-10 (×3): qty 3
  Filled 2020-05-10 (×2): qty 8
  Filled 2020-05-10: qty 3
  Filled 2020-05-10: qty 8
  Filled 2020-05-10 (×3): qty 3

## 2020-05-10 MED ORDER — IOHEXOL 350 MG/ML SOLN
100.0000 mL | Freq: Once | INTRAVENOUS | Status: AC | PRN
  Administered 2020-05-10: 100 mL via INTRAVENOUS

## 2020-05-10 MED ORDER — ADULT MULTIVITAMIN W/MINERALS CH
1.0000 | ORAL_TABLET | Freq: Every day | ORAL | Status: DC
  Administered 2020-05-10 – 2020-06-10 (×31): 1 via ORAL
  Filled 2020-05-10 (×31): qty 1

## 2020-05-10 MED ORDER — INSULIN GLARGINE 100 UNIT/ML ~~LOC~~ SOLN
25.0000 [IU] | Freq: Every day | SUBCUTANEOUS | Status: DC
  Administered 2020-05-10 – 2020-05-11 (×2): 25 [IU] via SUBCUTANEOUS
  Filled 2020-05-10 (×3): qty 0.25

## 2020-05-10 MED ORDER — INSULIN ASPART 100 UNIT/ML ~~LOC~~ SOLN
8.0000 [IU] | Freq: Three times a day (TID) | SUBCUTANEOUS | Status: DC
  Administered 2020-05-10 – 2020-05-11 (×3): 8 [IU] via SUBCUTANEOUS

## 2020-05-10 MED ORDER — NYSTATIN 100000 UNIT/GM EX POWD
Freq: Two times a day (BID) | CUTANEOUS | Status: DC
  Administered 2020-06-08: 1 via TOPICAL
  Filled 2020-05-10 (×4): qty 15

## 2020-05-10 MED ORDER — FUROSEMIDE 10 MG/ML IJ SOLN
20.0000 mg | Freq: Once | INTRAMUSCULAR | Status: AC
  Administered 2020-05-11: 20 mg via INTRAVENOUS
  Filled 2020-05-10: qty 2

## 2020-05-10 MED ORDER — DIPHENHYDRAMINE HCL 50 MG/ML IJ SOLN
25.0000 mg | Freq: Once | INTRAMUSCULAR | Status: AC
  Administered 2020-05-11: 25 mg via INTRAVENOUS
  Filled 2020-05-10: qty 1

## 2020-05-10 MED ORDER — SODIUM CHLORIDE 0.9% IV SOLUTION: Freq: Once | INTRAVENOUS | Status: AC

## 2020-05-10 MED ORDER — FLUCONAZOLE 150 MG PO TABS
150.0000 mg | ORAL_TABLET | ORAL | Status: AC
  Administered 2020-05-10 – 2020-05-13 (×2): 150 mg via ORAL
  Filled 2020-05-10 (×2): qty 1

## 2020-05-10 NOTE — Progress Notes (Signed)
     [address]       [place] [address]             [phone]        Good expansion following placement of additional chest tube Will keep both chest tubes for now given output.  Azekiel Cremer Keane Scrape

## 2020-05-10 NOTE — Progress Notes (Signed)
Bleed around chest tube. CTS and IR notified earlier in the day. Hgb dropped from 11 to 8. Heparin held. Repeat H&H and stat CTA chest ordered at 2030. Also ordered 2 units of blood in case she continues to bleed. May need embolization if CTA reveals bleeding blood vessel. Overnight MD and RN notified. RN to obtain transfusion consent. Overnight MD will contact IR after CTA result.

## 2020-05-10 NOTE — Progress Notes (Signed)
Pt would like her aunt to be called first, since she can answer phone more than her son.  Updated her contact info.  Hinton Dyer, RN

## 2020-05-10 NOTE — Significant Event (Addendum)
Rapid Response Event Note   Reason for Call :  Firm skin around chest tube site.  Initial Focused Assessment:  Pt alert, oriented. No distress, on 3LNC. Pt intermittently having tension pain to her right, lateral chest at chest tube insertion site. Chest tube is set-up to -20cm of suction. Sanguineous drainage with blood clots noted in tubing and [facility]. Bloody, sanguineous drainage noted to insertion site dressing. Firm tissue is palpated around insertion site. No skin discoloration is noted.  VS: T 97.86F, BP 122/57, HR 94, RR 20, SpO2 99% on 4LNC  Interventions:  -CXR -H&H -PRN pain medication for site discomfort  Plan of Care:  - Monitor & document chest tube output - Maintain a clean & dry dressing around insertion site - Apply marking to firm skin and notify if the size increases - Monitor pt for increased work of breathing, increased supplemental oxygen needs  Call rapid response for additional needs.  Event Summary:  MD Notified: Dr. Vickey Sages Call Time: 1618 Arrival Time: 1620 End Time: 1650  Jennye Moccasin, RN

## 2020-05-10 NOTE — Progress Notes (Signed)
Spoke with Denny Peon and was told to contact IR since cardiothorac did not place the chest tube.    Received a page back from IR Dr. Elby Showers.  Gave him information about her condition regarding right chest tube site bleeding and painful hard spot surrounding the chest tube site.  He said the chest tube was placed in outside the hospital, and also they do not manage that chest tube; the best option is to contact cardiothorasic.  However, it looks like the tip of the chest tube is up against right apical, so suggesting to remove it and get a CTA.  Paged cardiothorasic Dr. Vickey Sages and informed of Dr. Jerrye Noble recommendation.  Per Dr. Vickey Sages, Dr. Cliffton Asters saw her a few hours ago and noted good chest expansion, so we were not removing the chest tube. Apply Vaseline gauze and keep reinforcing the dressing.  Rapid response is assessing her chest tube site.  Dr. Alanda Slim made aware of above.  He will order chest xray and H&H.  Hinton Dyer, RN

## 2020-05-10 NOTE — Progress Notes (Signed)
PROGRESS NOTE  Annette [name] WVP:[id number] DOB: 1961-04-27   PCP: Patient, No Pcp Per  Patient is from: Home  DOA: 05/07/2020 LOS: 3  Brief Narrative / Interim history: 10 yr F with recent COVID-19 about 2 months ago, recent PE on Eliquis, COPD/Asthma not on oxygen, DM-2, HTN and HLD who was admitted to [facility] on 05/03/2020 where she was admitted for RUL MRSA pneumonia and RLL subsegmental PE (TTE with mod PAH but no right heart strain) treated with vancomycin, then Zyvox,  and Lovenox.  She developed right-sided hydropneumothorax on 11/22 underwent chest tube insertion on 05/06/2020 and transferred to [facility] for CTS evaluation and treatment.   On arrival here, somewhat somnolent but hemodynamically stable.  Saturating in upper 90s on 3 L by [place].  Bicarb 34.  Glucose 282.  Albumin 1.8.  WBC 12.2 with left shift.  Platelets 129.  Otherwise, CBC and CMP without significant finding.  Pro-Cal and lactic acid negative.  Cultures obtained.  Neurosurgery consulted and recommended CT chest.  CT chest without contrast with moderately large right pneumothorax, subcutaneous emphysema throughout the right chest wall extending into right neck, pneumomediastinum, right upper lobe opacity and emphysema.  IR consulted by CTS and placed additional chest tube to right chest on 05/08/2020.  Pneumothorax nearly resolved but persistent right lung opacity on chest x-ray.  IV Unasyn added on 11/26.  CTS following.   Subjective: Seen and examined earlier this morning.  No major events overnight or this morning.  Reports right-sided chest wall pain from chest tube.  She rates her pain 8/10 and describes it as burning.  Denies significant shortness of breath.  Reports weight cough but has not been expectorating.  Denies GI or UTI symptoms.  Objective: Vitals:   05/10/20 0007 05/10/20 0630 05/10/20 0814 05/10/20 1125  BP: (!) 101/58 121/64 105/69 98/72  Pulse: 92 98 98 100  Resp: (!) 22  (!) 21 20 (!) 24  Temp: 98 F (36.7 C) 97.9 F (36.6 C) 97.6 F (36.4 C) 97.7 F (36.5 C)  TempSrc: Oral Oral Oral Oral  SpO2: 96% 92% 97% 98%  Weight:      Height:        Intake/Output Summary (Last 24 hours) at 05/10/2020 1221 Last data filed at 05/10/2020 0900 Gross per 24 hour  Intake 600 ml  Output 1235 ml  Net -635 ml   Filed Weights   05/07/20 2100 05/08/20 0414 05/09/20 0526  Weight: 117.9 kg 118.8 kg 113.7 kg    Examination:  GENERAL: No apparent distress.  Nontoxic. HEENT: MMM.  Vision and hearing grossly intact.  NECK: Supple.  No apparent JVD.  RESP: 92% on 4 L.  No IWOB.  Rhonchi bilaterally.  Chest tubes with serosanguineous output CVS:  RRR. Heart sounds normal.  ABD/GI/GU: BS+. Abd soft, NTND.  MSK/EXT:  Moves extremities. No apparent deformity. No edema.  SKIN: no apparent skin lesion or wound NEURO: Awake, alert and oriented appropriately.  No apparent focal neuro deficit. PSYCH: Calm. Normal affect.   Procedures:  05/06/2020-right chest tube placement at outside hospital 05/08/2020-second right chest tube placement by IR  Chest x-ray from this morning   Microbiology summarized: Blood cultures NGTD.  Assessment & Plan: Right upper lobe MRSA pneumonia: No fever but leukocytosis.  CXR with near resolution of pneumothorax but extensive RUL consolidation and additional bilateral lung opacities as above.  Procalcitonin 0.18 -Vancomycin 11/19-11/21>> linezolid 11/21>> -Add IV Unasyn 11/26>>. -Monitor procalcitonin  Acute RLL subsegmental PE in  patient with recent prior PE: TTE at outside hospital with moderate PAH but no right heart strain. -Continue IV heparin for anticoagulation -Hold home Eliquis.  Right-sided hydropneumothorax-CT chest with large right pneumothorax, subcutaneous emphysema throughout the right chest wall extending into right neck, pneumomediastinum, RUL opacity and emphysema.  Additional chest tube placed on 11/24.  CXR with  minimal residual pneumothorax but persistent subcutaneous emphysema and persistent right lung opacity. -CTS following. -Scheduled Tylenol and Lyrica with as needed oxycodone and morphine for pain control. -Daily chest x-ray -Bowel regimen  Acute hypoxemic respiratory failure: Requiring 4 L at rest -Continuous pulse ox -Wean oxygen as able  Chronic COPD: Stable -DuoNebs as needed  Hypertension?  Normotensive for most part.  Not on medication at home. -Continue monitoring  Hyperlipidemia -Continue Lipitor and fenofibrate  Uncontrolled noninsulin-dependent type II diabetes with hyperglycemia: A1c 10.7%. Recent Labs  Lab 05/09/20 1309 05/09/20 1706 05/09/20 2119 05/10/20 0811 05/10/20 1119  GLUCAP 291* 298* 321* 221* 238*  -Continue SSI-moderate -Increase mealtime NovoLog from 4 to 8 units -Increase Lantus from 20 to 25 units twice daily -Continue home statin.  GERD -Continue PPI  Anxiety: Stable -Continue home Klonopin  Neuropathy/chronic pain -Continue home Lyrica  History of COVID-19 infection: Treated for this about 2 months ago.  Leukocytosis/bandemia: Likely due to #1.  Improving. -Continue monitoring  Thrombocytopenia: Platelet 126.  Relatively stable. -Continue monitoring  Intertrigo/cutaneous candidiasis -Diflucan 150 mg every 72 hours x2 -Topical nystatin powder  Morbid obesity: Body mass index is 38.11 kg/m. Nutrition Problem: Increased nutrient needs Etiology: wound healing, acute illness (recent COVID PNA) Signs/Symptoms: estimated needs Interventions: Refer to RD note for recommendations   Stage II pressure skin injury: POA Pressure Injury 05/07/20 Coccyx Medial;Mid Stage 2 -  Partial thickness loss of dermis presenting as a shallow open injury with a red, pink wound bed without slough. (Active)  05/07/20 2100  Location: Coccyx  Location Orientation: Medial;Mid  Staging: Stage 2 -  Partial thickness loss of dermis presenting as a  shallow open injury with a red, pink wound bed without slough.  Wound Description (Comments):   Present on Admission: Yes   DVT prophylaxis:   On IV heparin for PE  Code Status: Full code Family Communication: Patient and/or RN. Available if any question.  Status is: Inpatient  Remains inpatient appropriate because:Unsafe d/c plan, IV treatments appropriate due to intensity of illness or inability to take PO and Inpatient level of care appropriate due to severity of illness   Dispo: The patient is from: Home              Anticipated d/c is to: To be determined              Anticipated d/c date is: > 3 days              Patient currently is not medically stable to d/c.       Consultants:  Cardiothoracic surgery Interventional radiology   Sch Meds:  Scheduled Meds: . acetaminophen  1,000 mg Oral Q8H  . atorvastatin  40 mg Oral Daily  . cholecalciferol  1,000 Units Oral Daily  . clonazePAM  0.5 mg Oral BID  . fenofibrate  160 mg Oral Daily  . fluconazole  150 mg Oral Q72H  . insulin aspart  0-15 Units Subcutaneous TID WC  . insulin aspart  0-5 Units Subcutaneous QHS  . insulin aspart  8 Units Subcutaneous TID WC  . insulin glargine  25 Units Subcutaneous QHS  .  linezolid  600 mg Oral Q12H  . nystatin   Topical BID  . pantoprazole  40 mg Oral BID  . pregabalin  150 mg Oral TID   Continuous Infusions: . ampicillin-sulbactam (UNASYN) IV 3 g (05/10/20 1000)  . heparin 1,800 Units/hr (05/10/20 1009)   PRN Meds:.influenza vac split quadrivalent PF, ipratropium-albuterol, morphine injection, oxyCODONE, pneumococcal 23 valent vaccine  Antimicrobials: Anti-infectives (From admission, onward)   Start     Dose/Rate Route Frequency Ordered Stop   05/10/20 1245  fluconazole (DIFLUCAN) tablet 150 mg        150 mg Oral every 72 hours 05/10/20 1151 05/16/20 1244   05/10/20 0900  Ampicillin-Sulbactam (UNASYN) 3 g in sodium chloride 0.9 % 100 mL IVPB        3 g 200 mL/hr over 30  Minutes Intravenous Every 6 hours 05/10/20 0753 05/17/20 0859   05/08/20 0030  linezolid (ZYVOX) tablet 600 mg        600 mg Oral Every 12 hours 05/08/20 0015         I have personally reviewed the following labs and images: CBC: Recent Labs  Lab 05/08/20 0016 05/09/20 0011 05/10/20 0245  WBC 12.2* 19.8* 15.8*  NEUTROABS  --   --  13.8*  HGB 11.2* 11.2* 11.0*  HCT 36.3 36.0 35.8*  MCV 91.0 91.8 93.0  PLT 129* 128* 126*   BMP &GFR Recent Labs  Lab 05/08/20 0016 05/09/20 0011 05/10/20 0245  NA 135 134* 137  K 4.6 4.5 4.7  CL 96* 96* 100  CO2 34* 30 33*  GLUCOSE 282* 349* 255*  BUN 21* 23* 19  CREATININE 0.70 0.69 0.70  CALCIUM 8.3* 7.4* 8.0*  MG  --  1.7 1.8  PHOS  --  3.3 2.6   Estimated Creatinine Clearance: 98.9 mL/min (by C-G formula based on SCr of 0.7 mg/dL). Liver & Pancreas: Recent Labs  Lab 05/08/20 0016 05/09/20 0011 05/10/20 0245  AST 25  --   --   ALT 34  --   --   ALKPHOS 51  --   --   BILITOT 0.8  --   --   PROT 4.2*  --   --   ALBUMIN 1.8* 1.5* 1.4*   No results for input(s): LIPASE, AMYLASE in the last 168 hours. No results for input(s): AMMONIA in the last 168 hours. Diabetic: Recent Labs    05/08/20 0016  HGBA1C 10.7*   Recent Labs  Lab 05/09/20 1309 05/09/20 1706 05/09/20 2119 05/10/20 0811 05/10/20 1119  GLUCAP 291* 298* 321* 221* 238*   Cardiac Enzymes: No results for input(s): CKTOTAL, CKMB, CKMBINDEX, TROPONINI in the last 168 hours. No results for input(s): PROBNP in the last 8760 hours. Coagulation Profile: No results for input(s): INR, PROTIME in the last 168 hours. Thyroid Function Tests: No results for input(s): TSH, T4TOTAL, FREET4, T3FREE, THYROIDAB in the last 72 hours. Lipid Profile: No results for input(s): CHOL, HDL, LDLCALC, TRIG, CHOLHDL, LDLDIRECT in the last 72 hours. Anemia Panel: No results for input(s): VITAMINB12, FOLATE, FERRITIN, TIBC, IRON, RETICCTPCT in the last 72 hours. Urine analysis: No  results found for: COLORURINE, APPEARANCEUR, LABSPEC, PHURINE, GLUCOSEU, HGBUR, BILIRUBINUR, KETONESUR, PROTEINUR, UROBILINOGEN, NITRITE, LEUKOCYTESUR Sepsis Labs: Invalid input(s): PROCALCITONIN, LACTICIDVEN  Microbiology: Recent Results (from the past 240 hour(s))  Culture, blood (routine x 2)     Status: None (Preliminary result)   Collection Time: 05/08/20 12:16 AM   Specimen: BLOOD  Result Value Ref Range Status   Specimen Description  BLOOD LEFT HAND  Final   Special Requests   Final    BOTTLES DRAWN AEROBIC AND ANAEROBIC Blood Culture results may not be optimal due to an excessive volume of blood received in culture bottles   Culture   Final    NO GROWTH 1 DAY Performed at [facility], [address], [place], [place] [address]    Report Status PENDING  Incomplete  Culture, blood (routine x 2)     Status: None (Preliminary result)   Collection Time: 05/08/20 12:16 AM   Specimen: BLOOD  Result Value Ref Range Status   Specimen Description BLOOD RIGHT ARM  Final   Special Requests   Final    BOTTLES DRAWN AEROBIC AND ANAEROBIC Blood Culture results may not be optimal due to an excessive volume of blood received in culture bottles   Culture   Final    NO GROWTH 1 DAY Performed at [facility], [address] [place], [place] [address]    Report Status PENDING  Incomplete    Radiology Studies: DG CHEST PORT 1 VIEW  Result Date: 05/10/2020 CLINICAL DATA:  Shortness of breath. History of pneumothorax. Right chest tubes. EXAM: PORTABLE CHEST 1 VIEW COMPARISON:  05/09/2020. FINDINGS: Two right chest tubes in stable position. Interim near complete resolution of right pneumothorax with possible very tiny residual right apical pneumothorax. Dense right upper lung consolidation again noted without interim change. Low lung volumes. Borderline cardiomegaly. Stable right chest wall subcutaneous emphysema. IMPRESSION: 1. Two right chest tubes in stable position. Interim near  complete resolution of right pneumothorax with possible very tiny residual right apical pneumothorax. Stable right chest wall subcutaneous emphysema. 2.  Persistent dense right upper lobe consolidation. 3.  Borderline cardiomegaly. Electronically Signed   By: Maisie Fus  Register   On: 05/10/2020 07:22     Quatisha Zylka T. Windi Toro [facility]  If 7PM-7AM, please contact night-coverage [email or web address] 05/10/2020, 12:21 PM

## 2020-05-10 NOTE — Progress Notes (Signed)
Pt's chest tube dressing was saturated with blood, this RN changed and reinforced the dressing. Will continue to monitor.   Bari Edward, RN

## 2020-05-10 NOTE — Progress Notes (Signed)
      [address]       [place] [address]             [phone]           Subjective: Patient has pain at chest sites but states her breathing is improving.  Objective: Vital signs in last 24 hours: Temp:  [97.8 F (36.6 C)-98.6 F (37 C)] 97.9 F (36.6 C) (11/26 0630) Pulse Rate:  [89-98] 98 (11/26 0630) Cardiac Rhythm: Normal sinus rhythm (11/25 1900) Resp:  [20-30] 21 (11/26 0630) BP: (101-130)/(58-82) 121/64 (11/26 0630) SpO2:  [92 %-97 %] 92 % (11/26 0630)      Intake/Output from previous day: 11/25 0701 - 11/26 0700 In: 600 [P.O.:600] Out: 1410 [Urine:900; Chest Tube:510]   Physical Exam:  Cardiovascular: RRR Pulmonary: Clear to auscultation on left and diminished right base Abdomen: Soft, non tender, bowel sounds present. Extremities: Trace  bilateral lower extremity edema. Wounds: Dressing is clean and dry.   Chest Tubes: to suction and small air leak, bloody drainage  Lab Results: [id number]:[id number] Labs    05/09/20 0011 05/10/20 0245  WBC 19.8* 15.8*  HGB 11.2* 11.0*  HCT 36.0 35.8*  PLT 128* 126*   BMET:  Recent Labs    05/09/20 0011 05/10/20 0245  NA 134* 137  K 4.5 4.7  CL 96* 100  CO2 30 33*  GLUCOSE 349* 255*  BUN 23* 19  CREATININE 0.69 0.70  CALCIUM 7.4* 8.0*    PT/INR: No results for input(s): LABPROT, INR in the last 72 hours. ABG:  INR: Will add last result for INR, ABG once components are confirmed Will add last 4 CBG results once components are confirmed  Assessment/Plan:  1. CV - SR 2.  Pulmonary - History of COVID, MRSA PNA. S/p CT guided 14 French right chest tube for loculated right hydropneumothorax, previously placed right chest tube at other facility. Chest tubes with 510 cc last 24 hours. to suction and small air leak,  CXR this am shows near complete resolution of right hydropneumothorax, possible trace right apical pneumothorax.Check CXR in am   [facility] [place] ZimmermanPA-C 05/10/2020,7:54  AM [phone]

## 2020-05-10 NOTE — Progress Notes (Addendum)
Nutrition Follow-up  DOCUMENTATION CODES:   Obesity unspecified  INTERVENTION:    Ensure Max po BID, each supplement provides 150 kcal and 30 grams of protein.   MVI with minerals daily.  NUTRITION DIAGNOSIS:   Increased nutrient needs related to wound healing, acute illness (recent COVID PNA) as evidenced by estimated needs.  Ongoing  GOAL:   Patient will meet greater than or equal to 90% of their needs   Progressing  MONITOR:   PO intake, Supplement acceptance, Skin  REASON FOR ASSESSMENT:   Malnutrition Screening Tool    ASSESSMENT:   59 yo female admitted from [facility] for treatment of a complicated pneumothorax in the setting of MRSA PNA. PMH includes COVID PNA 2 months PTA, recent PE, COPD, HTN, DM, asthma.   Chest tube x 2 in place: 510 ml output x 24 hours Pneumothorax not quite resolved, subcutaneous emphysema present; IV antibiotics ordered.  Diet advanced to CHO modified. Meal intakes recorded at 50-100%.  Patient would benefit from a protein supplement to ensure adequate protein intake.  Labs reviewed.  CBG: [phone] Mealtime novolog and lantus doses are being adjusted.  Medications reviewed and include cholecalciferol, novolog, lantus.  Weight down to 113.7 kg today from 117.9 kg on 11/23.  Diet Order:   Diet Order            Diet Carb Modified Fluid consistency: Thin; Room service appropriate? Yes  Diet effective now                 EDUCATION NEEDS:   Not appropriate for education at this time  Skin:  Skin Assessment: Skin Integrity Issues: Skin Integrity Issues:: Stage II Stage II: coccyx  Last BM:  11/24  Height:   Ht Readings from Last 1 Encounters:  05/07/20 5\' 8"  (1.727 m)    Weight:   Wt Readings from Last 1 Encounters:  05/09/20 113.7 kg    Ideal Body Weight:  63.6 kg  BMI:  Body mass index is 38.11 kg/m.  Estimated Nutritional Needs:   Kcal:  2300-2500  Protein:  125-150  gm  Fluid:  >/= 2 L    05/11/20, RD, LDN, CNSC Please refer to Amion for contact information.

## 2020-05-10 NOTE — Progress Notes (Signed)
Her right chest tube site dressing was saturated with blood.  Cardiothorasic, IR, and Dr. Alanda Slim made aware.  Hinton Dyer, RN

## 2020-05-10 NOTE — Progress Notes (Signed)
ANTICOAGULATION CONSULT NOTE  Pharmacy Consult for Heparin  Indication: pulmonary embolus  Allergies  Allergen Reactions  . Cymbalta [Duloxetine Hcl]     Psychosis "self-harm"   Patient Measurements: Height: 5\' 8"  (172.7 cm) Weight: 113.7 kg (250 lb 10.6 oz) IBW/kg (Calculated) : 63.9  Vital Signs: Temp: 97.6 F (36.4 C) (11/26 0814) Temp Source: Oral (11/26 0814) BP: 105/69 (11/26 0814) Pulse Rate: 98 (11/26 09-25-1989)  Assessment: 59 y/o F transfer from [facility]. Pt has PE and had been on Apixaban recently (last dose not clear) and was transferred on heparin.   Pt is s/p chest tube placement and IV heparin was resumed for PE on 11/24.  Today's  heparin level is therapeutic at 0.35on 1800 units/hour. Hgb stable at 11.0  Goal of Therapy:  Heparin level 0.3-0.7 units/ml Monitor platelets by anticoagulation protocol: Yes   Plan:  Continue heparin 1800 units/hr Daily heparin level, CBC  12/24, PharmD Clinical Pharmacist **Pharmacist phone directory can now be found on [email or web address] ([email or web address]).  Listed under [facility].

## 2020-05-10 NOTE — Progress Notes (Signed)
Hemoglobin dropped to 8 from 11.  Dr. Alanda Slim made aware.  Received order to hold heparin drip tonight.  Hinton Dyer, RN

## 2020-05-10 NOTE — Progress Notes (Signed)
Chief Complaint: Patient was seen today for (R)chest tube   Supervising Physician: Dr. Elby Showers  Patient Status: [facility] - In-pt  Subjective: S/p right chest tube placement 11/24. Pt resting. Sore at chest tube sites but overall feeling some better.   Objective: Physical Exam: BP 105/69 (BP Location: Right Arm)   Pulse 98   Temp 97.6 F (36.4 C) (Oral)   Resp 20   Ht 5\' 8"  (1.727 m)   Wt 113.7 kg   SpO2 97%   BMI 38.11 kg/m  Chest drains in place, site clean, no leaks or kinks. Bloody output   Current Facility-Administered Medications:  .  acetaminophen (TYLENOL) tablet 1,000 mg, 1,000 mg, Oral, Q8H, Gonfa, Taye T, MD, 1,000 mg at 05/10/20 0650 .  Ampicillin-Sulbactam (UNASYN) 3 g in sodium chloride 0.9 % 100 mL IVPB, 3 g, Intravenous, Q6H, Gonfa, Taye T, MD, Last Rate: 200 mL/hr at 05/10/20 1000, 3 g at 05/10/20 1000 .  atorvastatin (LIPITOR) tablet 40 mg, 40 mg, Oral, Daily, 05/12/20, MD, 40 mg at 05/10/20 1001 .  cholecalciferol (VITAMIN D3) tablet 1,000 Units, 1,000 Units, Oral, Daily, 05/12/20, MD, 1,000 Units at 05/10/20 1001 .  clonazePAM (KLONOPIN) tablet 0.5 mg, 0.5 mg, Oral, BID, 05/12/20, MD, 0.5 mg at 05/10/20 1001 .  fenofibrate tablet 160 mg, 160 mg, Oral, Daily, 05/12/20, MD, 160 mg at 05/10/20 1000 .  heparin ADULT infusion 100 units/mL (25000 units/256mL sodium chloride 0.45%), 1,800 Units/hr, Intravenous, Continuous, Pham, Minh Q, RPH-CPP, Last Rate: 18 mL/hr at 05/10/20 1009, 1,800 Units/hr at 05/10/20 1009 .  influenza vac split quadrivalent PF (FLUARIX) injection 0.5 mL, 0.5 mL, Intramuscular, Prior to discharge, Gonfa, Taye T, MD .  insulin aspart (novoLOG) injection 0-15 Units, 0-15 Units, Subcutaneous, TID WC, 08-14-1985, MD, 5 Units at 05/10/20 0828 .  insulin aspart (novoLOG) injection 0-5 Units, 0-5 Units, Subcutaneous, QHS, 05/12/20 T, MD, 4 Units at 05/09/20 2151 .  insulin aspart (novoLOG) injection 4  Units, 4 Units, Subcutaneous, TID WC, 2152, MD, 4 Units at 05/10/20 0829 .  insulin glargine (LANTUS) injection 20 Units, 20 Units, Subcutaneous, QHS, 05/12/20 T, MD, 20 Units at 05/09/20 2151 .  ipratropium-albuterol (DUONEB) 0.5-2.5 (3) MG/3ML nebulizer solution 3 mL, 3 mL, Nebulization, Q4H PRN, 2152, MD .  linezolid (ZYVOX) tablet 600 mg, 600 mg, Oral, Q12H, John Giovanni, MD, 600 mg at 05/10/20 1001 .  morphine 2 MG/ML injection 3 mg, 3 mg, Intravenous, Q3H PRN, 05/12/20 T, MD, 3 mg at 05/10/20 0948 .  oxyCODONE (Oxy IR/ROXICODONE) immediate release tablet 5 mg, 5 mg, Oral, Q6H PRN, 05/12/20 T, MD, 5 mg at 05/10/20 0828 .  pantoprazole (PROTONIX) EC tablet 40 mg, 40 mg, Oral, BID, 05/12/20, MD, 40 mg at 05/10/20 1001 .  pneumococcal 23 valent vaccine (PNEUMOVAX-23) injection 0.5 mL, 0.5 mL, Intramuscular, Prior to discharge, 05/12/20 T, MD .  pregabalin (LYRICA) capsule 150 mg, 150 mg, Oral, TID, Candelaria Stagers, MD, 150 mg at 05/10/20 1001  Labs: CBC Recent Labs    05/09/20 0011 05/10/20 0245  WBC 19.8* 15.8*  HGB 11.2* 11.0*  HCT 36.0 35.8*  PLT 128* 126*   BMET Recent Labs    05/09/20 0011 05/10/20 0245  NA 134* 137  K 4.5 4.7  CL 96* 100  CO2 30 33*  GLUCOSE 349* 255*  BUN 23* 19  CREATININE 0.69 0.70  CALCIUM 7.4* 8.0*   LFT Recent Labs  05/08/20 0016 05/09/20 0011 05/10/20 0245  PROT 4.2*  --   --   ALBUMIN 1.8*   < > 1.4*  AST 25  --   --   ALT 34  --   --   ALKPHOS 51  --   --   BILITOT 0.8  --   --    < > = values in this interval not displayed.   PT/INR No results for input(s): LABPROT, INR in the last 72 hours.   Studies/Results: DG CHEST PORT 1 VIEW  Result Date: 05/10/2020 CLINICAL DATA:  Shortness of breath. History of pneumothorax. Right chest tubes. EXAM: PORTABLE CHEST 1 VIEW COMPARISON:  05/09/2020. FINDINGS: Two right chest tubes in stable position. Interim near complete resolution  of right pneumothorax with possible very tiny residual right apical pneumothorax. Dense right upper lung consolidation again noted without interim change. Low lung volumes. Borderline cardiomegaly. Stable right chest wall subcutaneous emphysema. IMPRESSION: 1. Two right chest tubes in stable position. Interim near complete resolution of right pneumothorax with possible very tiny residual right apical pneumothorax. Stable right chest wall subcutaneous emphysema. 2.  Persistent dense right upper lobe consolidation. 3.  Borderline cardiomegaly. Electronically Signed   By: Maisie Fus  Register   On: 05/10/2020 07:22   DG Chest Port 1 View  Result Date: 05/09/2020 CLINICAL DATA:  Shortness of breath, MRSA pneumonia, RIGHT-side chest tube for small RIGHT pneumothorax EXAM: PORTABLE CHEST 1 VIEW COMPARISON:  Portable exam 0827 hours compared to 05/08/2020 FINDINGS: RIGHT thoracostomy tubes unchanged. Normal heart size, mediastinal contours, and pulmonary vascularity. Persistent consolidation RIGHT upper lobe with minimal atelectasis versus infiltrate at lung bases bilaterally greater on RIGHT. Persistent small loculated lateral RIGHT mid chest pneumothorax. Extensive RIGHT chest wall emphysema extending into RIGHT cervical region. No pleural effusion. IMPRESSION: RIGHT thoracostomy tubes with minimal residual RIGHT lateral mid chest pneumothorax. Extensive RIGHT upper lobe consolidation with additional atelectasis versus infiltrate at lung bases RIGHT greater than LEFT. Electronically Signed   By: Ulyses Southward M.D.   On: 05/09/2020 10:19   CT IMAGE GUIDED DRAINAGE BY PERCUTANEOUS CATHETER  Result Date: 05/08/2020 CLINICAL DATA:  History of COVID pneumonia 2 months ago, right upper lobe MRSA pneumonia, right lower lobe pulmonary emboli, complex right hydropneumothorax, incompletely controlled with surgical chest tube. Subcutaneous emphysema and pneumomediastinum. Additional chest drain requested. EXAM: CT GUIDED CHEST  DRAIN PLACEMENT ANESTHESIA/SEDATION: Intravenous Fentanyl and Versed 1.5mg  were administered as conscious sedation during continuous monitoring of the patient's level of consciousness and physiological / cardiorespiratory status by the radiology RN, with a total moderate sedation time of 18 minutes. PROCEDURE: The procedure, risks, benefits, and alternatives were explained to the patient. Questions regarding the procedure were encouraged and answered. The patient understands and consents to the procedure. Select axial scans through the chest were obtained. The anterior pneumothorax was localized and an appropriate skin entry site was determined and marked. The operative field was prepped with chlorhexidinein a sterile fashion, and a sterile drape was applied covering the operative field. A sterile gown and sterile gloves were used for the procedure. Local anesthesia was provided with 1% Lidocaine. Under CT fluoroscopic guidance, a percutaneous entry needle advanced into the anterior component of the right pneumothorax. Guidewire was advanced apically. Tract dilated to facilitate placement of a 14 French pigtail catheter, directed towards the apex. Catheter was connected to Pleur-evac suction -20 cm H2O. Follow-up scan shows good position of chest tube, partial evacuation of loculated anterior component, no immediate complication. Catheter secured externally  with 0 Prolene suture and StatLock and covered with Vaseline gauze and sterile gauze dressing. The patient tolerated the procedure well. COMPLICATIONS: None immediate FINDINGS: Anterior right pneumothorax was localized. 14 pigtail drain catheter placed, directed apically. IMPRESSION: Technically successful CT-guided right chest tube placement. Electronically Signed   By: Corlis Leak M.D.   On: 05/08/2020 17:01    Assessment/Plan: S/p perc pigtail chest drain 11/24 Pt also has additional chest tube that was placed at outside facility. Ir following along  with CTS    LOS: 3 days   I spent a total of 15 minutes in face to face in clinical consultation, greater than 50% of which was counseling/coordinating care for right chest tube  Brayton El PA-C 05/10/2020 11:13 AM

## 2020-05-11 ENCOUNTER — Inpatient Hospital Stay (HOSPITAL_COMMUNITY): Payer: Medicare Other

## 2020-05-11 DIAGNOSIS — D62 Acute posthemorrhagic anemia: Secondary | ICD-10-CM

## 2020-05-11 DIAGNOSIS — I2693 Single subsegmental pulmonary embolism without acute cor pulmonale: Secondary | ICD-10-CM | POA: Diagnosis not present

## 2020-05-11 DIAGNOSIS — S301XXA Contusion of abdominal wall, initial encounter: Secondary | ICD-10-CM

## 2020-05-11 DIAGNOSIS — J15212 Pneumonia due to Methicillin resistant Staphylococcus aureus: Secondary | ICD-10-CM | POA: Diagnosis not present

## 2020-05-11 DIAGNOSIS — J9601 Acute respiratory failure with hypoxia: Secondary | ICD-10-CM | POA: Diagnosis not present

## 2020-05-11 DIAGNOSIS — J9383 Other pneumothorax: Secondary | ICD-10-CM | POA: Diagnosis not present

## 2020-05-11 DIAGNOSIS — J9311 Primary spontaneous pneumothorax: Secondary | ICD-10-CM | POA: Diagnosis not present

## 2020-05-11 LAB — CBC WITH DIFFERENTIAL/PLATELET
Abs Immature Granulocytes: 0.05 10*3/uL (ref 0.00–0.07)
Basophils Absolute: 0 10*3/uL (ref 0.0–0.1)
Basophils Relative: 0 %
Eosinophils Absolute: 0.1 10*3/uL (ref 0.0–0.5)
Eosinophils Relative: 1 %
HCT: 27.3 % — ABNORMAL LOW (ref 36.0–46.0)
Hemoglobin: 8.5 g/dL — ABNORMAL LOW (ref 12.0–15.0)
Immature Granulocytes: 1 %
Lymphocytes Relative: 11 %
Lymphs Abs: 1.2 10*3/uL (ref 0.7–4.0)
MCH: 28.1 pg (ref 26.0–34.0)
MCHC: 31.1 g/dL (ref 30.0–36.0)
MCV: 90.4 fL (ref 80.0–100.0)
Monocytes Absolute: 0.5 10*3/uL (ref 0.1–1.0)
Monocytes Relative: 5 %
Neutro Abs: 8.4 10*3/uL — ABNORMAL HIGH (ref 1.7–7.7)
Neutrophils Relative %: 82 %
Platelets: 127 10*3/uL — ABNORMAL LOW (ref 150–400)
RBC: 3.02 MIL/uL — ABNORMAL LOW (ref 3.87–5.11)
RDW: 16.7 % — ABNORMAL HIGH (ref 11.5–15.5)
WBC: 10.3 10*3/uL (ref 4.0–10.5)
nRBC: 0 % (ref 0.0–0.2)

## 2020-05-11 LAB — PROTIME-INR
INR: 1 (ref 0.8–1.2)
Prothrombin Time: 13.1 seconds (ref 11.4–15.2)

## 2020-05-11 LAB — CBC
HCT: 25.5 % — ABNORMAL LOW (ref 36.0–46.0)
Hemoglobin: 7.9 g/dL — ABNORMAL LOW (ref 12.0–15.0)
MCH: 27.4 pg (ref 26.0–34.0)
MCHC: 31 g/dL (ref 30.0–36.0)
MCV: 88.5 fL (ref 80.0–100.0)
Platelets: 116 10*3/uL — ABNORMAL LOW (ref 150–400)
RBC: 2.88 MIL/uL — ABNORMAL LOW (ref 3.87–5.11)
RDW: 16.8 % — ABNORMAL HIGH (ref 11.5–15.5)
WBC: 9.3 10*3/uL (ref 4.0–10.5)
nRBC: 0 % (ref 0.0–0.2)

## 2020-05-11 LAB — GLUCOSE, CAPILLARY
Glucose-Capillary: 184 mg/dL — ABNORMAL HIGH (ref 70–99)
Glucose-Capillary: 188 mg/dL — ABNORMAL HIGH (ref 70–99)
Glucose-Capillary: 161 mg/dL — ABNORMAL HIGH (ref 70–99)
Glucose-Capillary: 203 mg/dL — ABNORMAL HIGH (ref 70–99)

## 2020-05-11 LAB — HEPARIN LEVEL (UNFRACTIONATED): Heparin Unfractionated: <0.1 IU/mL — ABNORMAL LOW (ref 0.30–0.70)

## 2020-05-11 LAB — COMPREHENSIVE METABOLIC PANEL
ALT: 13 U/L (ref 0–44)
AST: 12 U/L — ABNORMAL LOW (ref 15–41)
Albumin: 1.4 g/dL — ABNORMAL LOW (ref 3.5–5.0)
Alkaline Phosphatase: 41 U/L (ref 38–126)
Anion gap: 8 (ref 5–15)
BUN: 26 mg/dL — ABNORMAL HIGH (ref 6–20)
CO2: 31 mmol/L (ref 22–32)
Calcium: 7.7 mg/dL — ABNORMAL LOW (ref 8.9–10.3)
Chloride: 98 mmol/L (ref 98–111)
Creatinine, Ser: 0.76 mg/dL (ref 0.44–1.00)
GFR, Estimated: >60 mL/min (ref >60)
Glucose, Bld: 232 mg/dL — ABNORMAL HIGH (ref 70–99)
Potassium: 4.3 mmol/L (ref 3.5–5.1)
Sodium: 137 mmol/L (ref 135–145)
Total Bilirubin: 0.5 mg/dL (ref 0.3–1.2)
Total Protein: 3.5 g/dL — ABNORMAL LOW (ref 6.5–8.1)

## 2020-05-11 LAB — SURGICAL PCR SCREEN
MRSA, PCR: POSITIVE — AB
Staphylococcus aureus: POSITIVE — AB

## 2020-05-11 LAB — HEMOGLOBIN AND HEMATOCRIT, BLOOD
HCT: 25.3 % — ABNORMAL LOW (ref 36.0–46.0)
Hemoglobin: 8 g/dL — ABNORMAL LOW (ref 12.0–15.0)

## 2020-05-11 LAB — PROCALCITONIN: Procalcitonin: 0.26 ng/mL

## 2020-05-11 LAB — PREPARE RBC (CROSSMATCH)

## 2020-05-11 LAB — APTT: aPTT: 33 seconds (ref 24–36)

## 2020-05-11 MED ORDER — ALBUMIN HUMAN 5 % IV SOLN
12.5000 g | Freq: Once | INTRAVENOUS | Status: AC
  Administered 2020-05-11: 12.5 g via INTRAVENOUS
  Filled 2020-05-11: qty 250

## 2020-05-11 MED ORDER — MIDAZOLAM HCL 2 MG/2ML IJ SOLN
INTRAMUSCULAR | Status: AC
  Filled 2020-05-11: qty 2

## 2020-05-11 MED ORDER — FENTANYL CITRATE (PF) 250 MCG/5ML IJ SOLN
INTRAMUSCULAR | Status: AC
  Filled 2020-05-11: qty 5

## 2020-05-11 MED ORDER — PROPOFOL 10 MG/ML IV BOLUS
INTRAVENOUS | Status: AC
  Filled 2020-05-11: qty 40

## 2020-05-11 MED ORDER — CHLORHEXIDINE GLUCONATE CLOTH 2 % EX PADS
6.0000 | MEDICATED_PAD | Freq: Every day | CUTANEOUS | Status: DC
  Administered 2020-05-12: 6 via TOPICAL

## 2020-05-11 MED ORDER — MUPIROCIN 2 % EX OINT
1.0000 "application " | TOPICAL_OINTMENT | Freq: Two times a day (BID) | CUTANEOUS | Status: AC
  Administered 2020-05-11 – 2020-05-16 (×10): 1 via NASAL
  Filled 2020-05-11 (×6): qty 22

## 2020-05-11 NOTE — Progress Notes (Signed)
Patient had decreased hemoglobin to 7.7 with a hematocrit of 25.  Dr. Alanda Slim ordered 2 units of packed red blood cells earlier which are now ordered to be transfused. CT angiography of chest results just came in the short time ago and are reviewed.  Patient has a new right-sided hemothorax with chest tube coursing to the right lower lung lobe causing lung laceration per radiology report. Case was discussed with cardiothoracic surgery, Dr. Tammi Sou who is going to come evaluate patient

## 2020-05-11 NOTE — Progress Notes (Signed)
Received a call from blood bank.  They have 4 units of PRBC ready for her.  Hinton Dyer, RN

## 2020-05-11 NOTE — Progress Notes (Signed)
TCTS PN  Asked to see patient after chest CT ordered tonight in response to right CW hematoma on exam. I have personally reviewed the scan and her labs, and I have examined the patient at the bedside. She is hypotensive intermittently and mildly tachycardiac. Her Hgb has decreased by 3 points.   CT shows right CW and intrapleural hematoma which has developed while on heparin gtt, now held. This likely represent spontaneous bleed while anticoagulated. Orders for 2 u PRBC underway  There is a moderate R PTX; I have placed both tubes to -40 cm H2O suction to better relieve PTX  Suggest: transfuse as ordered; Hold heparin overnight Repeat labs and Xray in am May need to remove old, larger tube and replace tomorrow. Doubt need for operative exploration.  Annette [name] Z. Annette Sages, MD [phone]

## 2020-05-11 NOTE — Progress Notes (Signed)
Notified Dr. Maple Hudson of PO intake of sausage that was finished at 0800. Per Dr. Maple Hudson delay surgery until 1600 today unless declared an emergency. OR front desk notified regarding same.

## 2020-05-11 NOTE — Progress Notes (Signed)
TCTS PN  hemodynamicaly stable and feeling better after PRBC txfusion, but I doubt the existing tubes will solve her chest problem. Suggest right VATS evacuation of hematoma and pleurodesis with possible apical bleb resection later today. The patient agrees and wishes to proceed. Nashid Pellum Z. Vickey Sages, MD [phone]

## 2020-05-11 NOTE — Plan of Care (Signed)
  Problem: Clinical Measurements: Goal: Cardiovascular complication will be avoided Outcome: Progressing   Problem: Coping: Goal: Level of anxiety will decrease Outcome: Progressing   

## 2020-05-11 NOTE — Progress Notes (Signed)
Dr.Atkins came to see pt.& assesed pt.He  Placed  Both chest tubes to -40 cm H20 suction.Will continue to monitor pt.

## 2020-05-11 NOTE — Progress Notes (Addendum)
PROGRESS NOTE  Annette [name] WJX:[id number] DOB: 03/29/1961   PCP: Patient, No Pcp Per  Patient is from: Home  DOA: 05/07/2020 LOS: 4  Brief Narrative / Interim history: 33 yr F with recent COVID-19 about 2 months ago, recent PE on Eliquis, COPD/Asthma not on oxygen, DM-2, HTN and HLD who was admitted to [facility] on 05/03/2020 where she was admitted for RUL MRSA pneumonia and RLL subsegmental PE (TTE with mod PAH but no right heart strain) treated with vancomycin, then Zyvox,  and Lovenox.  She developed right-sided hydropneumothorax on 11/22 underwent chest tube insertion on 05/06/2020 and transferred to [facility] for CTS evaluation and treatment.   On arrival here, somewhat somnolent but hemodynamically stable.  Saturating in upper 90s on 3 L by [place].  Bicarb 34.  Glucose 282.  Albumin 1.8.  WBC 12.2 with left shift.  Platelets 129.  Otherwise, CBC and CMP without significant finding.  Pro-Cal and lactic acid negative.  Cultures obtained.  Neurosurgery consulted and recommended CT chest.  CT chest without contrast with moderately large right pneumothorax, subcutaneous emphysema throughout the right chest wall extending into right neck, pneumomediastinum, right upper lobe opacity and emphysema.  IR consulted by CTS and placed additional chest tube to right chest on 05/08/2020.  Pneumothorax nearly resolved but persistent right lung opacity on chest x-ray.  IV Unasyn added on 11/26.  Patient had significant bleeding around the chest tube.  Hemoglobin dropped 3 g.  Heparin held.  CTA chest with new nonocclusive PE in RLL, persistent moderate right-sided pneumothorax with mediastinal shift to the left, new moderate sized right-sided pneumothorax, changed chest tube possibly causing pulmonary laceration and new large right flank hematoma measuring 12 cm without definite evidence of active extravasation, diffuse wall thickening of gallbladder, coarse airspace opacities and pulmonary  fibrosis.  CTS to take patient for VATS this afternoon.  Patient received 2 units of blood.  Hgb improved to 8.5.   Subjective: Seen and examined earlier this morning.  Feels tired this morning.  Pain fairly controlled.  She denies shortness of breath, GI or UTI symptoms.  Denies dizziness or lightheadedness.  Objective: Vitals:   05/11/20 0739 05/11/20 1000 05/11/20 1100 05/11/20 1300  BP: 109/65   (!) 106/58  Pulse: 96 98 90 93  Resp: (!) 22 (!) 25 (!) 29 (!) 27  Temp: 97.6 F (36.4 C)     TempSrc: Oral     SpO2: 98% 97% 95% 99%  Weight:      Height:        Intake/Output Summary (Last 24 hours) at 05/11/2020 1428 Last data filed at 05/11/2020 0700 Gross per 24 hour  Intake 1314.35 ml  Output 2782 ml  Net -1467.65 ml   Filed Weights   05/07/20 2100 05/08/20 0414 05/09/20 0526  Weight: 117.9 kg 118.8 kg 113.7 kg    Examination:  GENERAL: No apparent distress.  Nontoxic. HEENT: MMM.  Vision and hearing grossly intact.  NECK: Supple.  No apparent JVD.  RESP: 92% on 4 L.  No IWOB.  Rhonchi bilaterally.  Chest tubes with serosanguineous output CVS:  RRR. Heart sounds normal.  ABD/GI/GU: BS+. Abd soft, NTND.  MSK/EXT:  Moves extremities. No apparent deformity. No edema.  SKIN: no apparent skin lesion or wound NEURO: Awake, alert and oriented appropriately.  No apparent focal neuro deficit. PSYCH: Calm. Normal affect.   Procedures:  05/06/2020-right chest tube placement at outside hospital 05/08/2020-second right chest tube placement by IR   Microbiology summarized:  Blood cultures NGTD.  Assessment & Plan: Right upper lobe MRSA pneumonia: No fever but leukocytosis.  CXR with near resolution of pneumothorax but extensive RUL consolidation and additional bilateral lung opacities as above.  Procalcitonin 0.18 -Vancomycin 11/19-11/21>> linezolid 11/21>> -Add IV Unasyn 11/26>>. -Monitor procalcitonin  Acute RLL subsegmental PE in patient with recent prior PE: TTE at  outside hospital with moderate PAH but no right heart strain. -Heparin on hold due to hematoma and bleeding around the chest tube.  Right-sided  pneumohemothorax with mediastinal shift-significant output from chest tube. -Planning for VATS surgery by CTS today. -Scheduled Tylenol and Lyrica with as needed oxycodone and morphine for pain control. -Daily chest x-ray -Bowel regimen  Acute blood loss anemia: Hgb dropped from 11>> 7.7>2u> 8.5> 7.9.  Likely due to hemothorax and hematoma. New large right flank hematoma: Approximately 12 cm in dimension.  No definite active arterial extravasation -Plan followed by surgery by CTS for pneumohemothorax. -Continue holding heparin -Monitor H&H -Transfuse for Hgb less than 7.0  Acute hypoxemic respiratory failure: Requiring 3 L at rest. -Continuous pulse ox -Wean oxygen as able  Chronic COPD: Stable -DuoNebs as needed  Hyperlipidemia -Continue Lipitor and fenofibrate  Uncontrolled noninsulin-dependent type II diabetes with hyperglycemia: A1c 10.7%. Recent Labs  Lab 05/10/20 1119 05/10/20 1611 05/10/20 2108 05/11/20 0806 05/11/20 1120  GLUCAP 238* 274* 273* 184* 188*  -Continue SSI-moderate -Continue NovoLog 8 units AC -Continue Lantus 25 units twice daily -Continue home statin.  GERD -Continue PPI  Anxiety: Stable -Continue home Klonopin  Neuropathy/chronic pain -Continue home Lyrica  History of COVID-19 infection: Treated for this about 2 months ago.  Leukocytosis/bandemia: Likely due to #1.  Resolved. -Continue monitoring  Thrombocytopenia: Platelet 126> 116 -Continue monitoring  Intertrigo/cutaneous candidiasis -Diflucan 150 mg every 72 hours x2 -Topical nystatin powder  Morbid obesity: Body mass index is 38.11 kg/m. Nutrition Problem: Increased nutrient needs Etiology: wound healing, acute illness (recent COVID PNA) Signs/Symptoms: estimated needs Interventions: MVI, Ensure Enlive (each supplement  provides 350kcal and 20 grams of protein)   Stage II pressure skin injury: POA Pressure Injury 05/07/20 Coccyx Medial;Mid Stage 2 -  Partial thickness loss of dermis presenting as a shallow open injury with a red, pink wound bed without slough. (Active)  05/07/20 2100  Location: Coccyx  Location Orientation: Medial;Mid  Staging: Stage 2 -  Partial thickness loss of dermis presenting as a shallow open injury with a red, pink wound bed without slough.  Wound Description (Comments):   Present on Admission: Yes   DVT prophylaxis:  Off IV heparin due to acute blood loss  Code Status: Full code Family Communication: Patient and/or RN. Available if any question.  Status is: Inpatient  Remains inpatient appropriate because:Hemodynamically unstable, Ongoing diagnostic testing needed not appropriate for outpatient work up, Unsafe d/c plan, IV treatments appropriate due to intensity of illness or inability to take PO and Inpatient level of care appropriate due to severity of illness   Dispo: The patient is from: Home              Anticipated d/c is to: To be determined              Anticipated d/c date is: > 3 days              Patient currently is not medically stable to d/c.       Consultants:  Cardiothoracic surgery Interventional radiology   Sch Meds:  Scheduled Meds: . acetaminophen  1,000 mg Oral Q8H  .  acetaminophen  650 mg Oral Once  . atorvastatin  40 mg Oral Daily  . cholecalciferol  1,000 Units Oral Daily  . clonazePAM  0.5 mg Oral BID  . fenofibrate  160 mg Oral Daily  . fluconazole  150 mg Oral Q72H  . insulin aspart  0-15 Units Subcutaneous TID WC  . insulin aspart  0-5 Units Subcutaneous QHS  . insulin aspart  8 Units Subcutaneous TID WC  . insulin glargine  25 Units Subcutaneous QHS  . linezolid  600 mg Oral Q12H  . multivitamin with minerals  1 tablet Oral Daily  . nystatin   Topical BID  . pantoprazole  40 mg Oral BID  . pregabalin  150 mg Oral TID  .  Ensure Max Protein  11 oz Oral BID   Continuous Infusions: . ampicillin-sulbactam (UNASYN) IV 3 g (05/11/20 1412)  . heparin Stopped (05/10/20 1858)   PRN Meds:.influenza vac split quadrivalent PF, ipratropium-albuterol, morphine injection, oxyCODONE, pneumococcal 23 valent vaccine  Antimicrobials: Anti-infectives (From admission, onward)   Start     Dose/Rate Route Frequency Ordered Stop   05/10/20 1300  fluconazole (DIFLUCAN) tablet 150 mg        150 mg Oral every 72 hours 05/10/20 1151 05/16/20 1259   05/10/20 0900  Ampicillin-Sulbactam (UNASYN) 3 g in sodium chloride 0.9 % 100 mL IVPB        3 g 200 mL/hr over 30 Minutes Intravenous Every 6 hours 05/10/20 0753 05/17/20 0859   05/08/20 0030  linezolid (ZYVOX) tablet 600 mg        600 mg Oral Every 12 hours 05/08/20 0015         I have personally reviewed the following labs and images: CBC: Recent Labs  Lab 05/08/20 0016 05/08/20 0016 05/09/20 0011 05/09/20 0011 05/10/20 0245 05/10/20 1730 05/10/20 2047 05/11/20 0747 05/11/20 1135  WBC 12.2*  --  19.8*  --  15.8*  --   --  10.3 9.3  NEUTROABS  --   --   --   --  13.8*  --   --  8.4*  --   HGB 11.2*   < > 11.2*   < > 11.0* 8.0* 7.7* 8.5* 7.9*  HCT 36.3   < > 36.0   < > 35.8* 26.0* 25.4* 27.3* 25.5*  MCV 91.0  --  91.8  --  93.0  --   --  90.4 88.5  PLT 129*  --  128*  --  126*  --   --  127* 116*   < > = values in this interval not displayed.   BMP &GFR Recent Labs  Lab 05/08/20 0016 05/09/20 0011 05/10/20 0245 05/11/20 1028  NA 135 134* 137 137  K 4.6 4.5 4.7 4.3  CL 96* 96* 100 98  CO2 34* 30 33* 31  GLUCOSE 282* 349* 255* 232*  BUN 21* 23* 19 26*  CREATININE 0.70 0.69 0.70 0.76  CALCIUM 8.3* 7.4* 8.0* 7.7*  MG  --  1.7 1.8  --   PHOS  --  3.3 2.6  --    Estimated Creatinine Clearance: 98.9 mL/min (by C-G formula based on SCr of 0.76 mg/dL). Liver & Pancreas: Recent Labs  Lab 05/08/20 0016 05/09/20 0011 05/10/20 0245 05/11/20 1028  AST 25  --    --  12*  ALT 34  --   --  13  ALKPHOS 51  --   --  41  BILITOT 0.8  --   --  0.5  PROT 4.2*  --   --  3.5*  ALBUMIN 1.8* 1.5* 1.4* 1.4*   No results for input(s): LIPASE, AMYLASE in the last 168 hours. No results for input(s): AMMONIA in the last 168 hours. Diabetic: No results for input(s): HGBA1C in the last 72 hours. Recent Labs  Lab 05/10/20 1119 05/10/20 1611 05/10/20 2108 05/11/20 0806 05/11/20 1120  GLUCAP 238* 274* 273* 184* 188*   Cardiac Enzymes: No results for input(s): CKTOTAL, CKMB, CKMBINDEX, TROPONINI in the last 168 hours. No results for input(s): PROBNP in the last 8760 hours. Coagulation Profile: Recent Labs  Lab 05/11/20 1135  INR 1.0   Thyroid Function Tests: No results for input(s): TSH, T4TOTAL, FREET4, T3FREE, THYROIDAB in the last 72 hours. Lipid Profile: No results for input(s): CHOL, HDL, LDLCALC, TRIG, CHOLHDL, LDLDIRECT in the last 72 hours. Anemia Panel: No results for input(s): VITAMINB12, FOLATE, FERRITIN, TIBC, IRON, RETICCTPCT in the last 72 hours. Urine analysis: No results found for: COLORURINE, APPEARANCEUR, LABSPEC, PHURINE, GLUCOSEU, HGBUR, BILIRUBINUR, KETONESUR, PROTEINUR, UROBILINOGEN, NITRITE, LEUKOCYTESUR Sepsis Labs: Invalid input(s): PROCALCITONIN, LACTICIDVEN  Microbiology: Recent Results (from the past 240 hour(s))  Culture, blood (routine x 2)     Status: None (Preliminary result)   Collection Time: 05/08/20 12:16 AM   Specimen: BLOOD  Result Value Ref Range Status   Specimen Description BLOOD LEFT HAND  Final   Special Requests   Final    BOTTLES DRAWN AEROBIC AND ANAEROBIC Blood Culture results may not be optimal due to an excessive volume of blood received in culture bottles   Culture   Final    NO GROWTH 3 DAYS Performed at [facility], [address] [place], [place] [address]    Report Status PENDING  Incomplete  Culture, blood (routine x 2)     Status: None (Preliminary result)   Collection Time:  05/08/20 12:16 AM   Specimen: BLOOD  Result Value Ref Range Status   Specimen Description BLOOD RIGHT ARM  Final   Special Requests   Final    BOTTLES DRAWN AEROBIC AND ANAEROBIC Blood Culture results may not be optimal due to an excessive volume of blood received in culture bottles   Culture   Final    NO GROWTH 3 DAYS Performed at [facility], [address] [place], [place] [address]    Report Status PENDING  Incomplete    Radiology Studies: CT ANGIO CHEST PE W OR WO CONTRAST  Result Date: 05/11/2020 CLINICAL DATA:  Hemothorax.  Bleeding from chest tube. EXAM: CT ANGIOGRAPHY CHEST WITH CONTRAST TECHNIQUE: Multidetector CT imaging of the chest was performed using the standard protocol during bolus administration of intravenous contrast. Multiplanar CT image reconstructions and MIPs were obtained to evaluate the vascular anatomy. CONTRAST:  OMNIPAQUE IOHEXOL 350 MG/ML SOLN COMPARISON:  CT chest dated May 08, 2020 FINDINGS: Cardiovascular: Contrast injection is sufficient to demonstrate satisfactory opacification of the pulmonary arteries to the segmental level. There is an acute, nonocclusive pulmonary embolism involving the right lower lobe pulmonary artery. No other significant pulmonary emboli are identified on this study. The size of the main pulmonary artery is enlarged, measuring 3.2 cm. Heart size is normal, with no pericardial effusion. The course and caliber of the aorta are normal. There is no atherosclerotic calcification. Opacification decreased due to pulmonary arterial phase contrast bolus timing. Mediastinum/Nodes: --mild mediastinal adenopathy is noted. --mild hilar adenopathy is noted. -- No axillary lymphadenopathy. -- No supraclavicular lymphadenopathy. -- Normal thyroid gland  where visualized. -  Unremarkable esophagus. Lungs/Pleura: 2 right-sided chest tubes are in place. There is a new anterior approach percutaneous chest tube in place. There is a persistent  moderate-sized right-sided pneumothorax which has minimally decreased in size from prior study. There is persistent pneumomediastinum. The mediastinum is shifted to the left. The right upper lobe is nearly entirely replaced by consolidation. There is a new loculated right-sided hemothorax. The right-sided surgically placed chest tube is again noted. The chest tube is kinked both proximally and distally. Chest tube also possibly courses through the right lower lobe causing a pulmonary laceration. There is a new right flank large hematoma measuring approximately 12 cm. Pockets of subcutaneous gas are again visualized along the patient's right flank. There is a trace left-sided pleural effusion. Debris is noted within the trachea and right lower lobe bronchus. Coarse airspace opacities and pulmonary fibrosis are again noted. Upper Abdomen: Contrast bolus timing is not optimized for evaluation of the abdominal organs. There is diffuse wall thickening of the gallbladder there is only partially visualized. A simple appearing cyst is noted in the liver. Bilateral adrenal nodules are again noted. Musculoskeletal: No chest wall abnormality. No bony spinal canal stenosis. Review of the MIP images confirms the above findings. IMPRESSION: 1. New, nonocclusive pulmonary embolism involving the right lower lobe pulmonary artery. No evidence for right-sided heart strain. 2. Persistent moderate-sized right-sided pneumothorax despite new chest tube placement. The mediastinum appears shifted to the left which has worsened since the prior study. This raises suspicion for developing tension physiology. 3. New moderate-sized, right-sided hemothorax. 4. The right-sided surgically placed chest tube is kinked both proximally and distally. Chest tube also possibly courses through the right lower lobe causing a pulmonary laceration. 5. New large right flank hematoma measuring approximately 12 cm. No definite evidence for active arterial  extravasation. 6. Diffuse wall thickening of the gallbladder which is only partially visualized. If there is clinical concern for acute cholecystitis, follow-up with ultrasound is recommended. 7. Coarse airspace opacities and pulmonary fibrosis are again noted. 8. Trace left-sided pleural effusion. These results were called by telephone at the time of interpretation on 05/11/2020 at 12:10 am to provider Charge Nurse Osvaldo Human , who verbally acknowledged these results. Electronically Signed   By: Katherine Mantle M.D.   On: 05/11/2020 00:10   DG CHEST PORT 1 VIEW  Result Date: 05/11/2020 CLINICAL DATA:  Shortness of breath.  Follow-up hydropneumothorax EXAM: PORTABLE CHEST 1 VIEW COMPARISON:  05/10/2020 FINDINGS: Two right chest tubes remain in place without change in position. Persistent volume loss in the right lung with right lung of opacities corresponding to known hydropneumothorax and consolidations. Subcutaneous emphysema in the right chest wall and right neck. Focal infiltration or atelectasis in the left mid lung. Normal heart size. No significant change since previous study. IMPRESSION: Right hydropneumothorax with right lung consolidations and subcutaneous emphysema. No change since prior study. Electronically Signed   By: Burman Nieves M.D.   On: 05/11/2020 05:41   DG Chest Port 1 View  Result Date: 05/10/2020 CLINICAL DATA:  Pneumohemothorax EXAM: PORTABLE CHEST 1 VIEW COMPARISON:  05/10/2020 at 7:10 a.m. FINDINGS: Right chest tubes remain in place. Similar degree of subcutaneous emphysema tracking along the right chest and neck. Continued consolidation in the upper half of the right lung with indistinct airspace opacity in the lower half of the right lung. There is some bandlike density in the lingula which is unchanged. There is little if any residual pneumomediastinum. If there is any  residual pneumothorax on the right, it is poorly apparent on today's semi erect portable chest  radiograph. IMPRESSION: 1. No appreciable change in the airspace opacity in the right lung, especially favoring the upper half of the right lung. 2. Stable subcutaneous emphysema along the right chest and neck. 3. No pneumothorax or definite pneumomediastinum visible on today's exam. 4. Stable bandlike opacity in the lingula, probably atelectasis. Electronically Signed   By: Gaylyn Rong M.D.   On: 05/10/2020 19:19     Tashaya Ancrum T. Alyse Kathan [facility]  If 7PM-7AM, please contact night-coverage [email or web address] 05/11/2020, 2:28 PM

## 2020-05-11 NOTE — Progress Notes (Signed)
ANTICOAGULATION CONSULT NOTE  Pharmacy Consult for Heparin  Indication: pulmonary embolus  Allergies  Allergen Reactions  . Cymbalta [Duloxetine Hcl]     Psychosis "self-harm"   Patient Measurements: Height: 5\' 8"  (172.7 cm) Weight: 113.7 kg (250 lb 10.6 oz) IBW/kg (Calculated) : 63.9  Vital Signs: Temp: 97.6 F (36.4 C) (11/27 0739) Temp Source: Oral (11/27 0739) BP: 109/65 (11/27 0739) Pulse Rate: 96 (11/27 0739)  Assessment: 59 y/o F transfer from [facility]. Pt has PE and had been on Apixaban recently (last dose not clear) and was transferred on heparin.   Pt is s/p chest tube placement and IV heparin was resumed for PE on 11/24.  On 11/26, Hgb dropped from 11 to 7.7. CTA showed new right-sided hemothroax. Per Dr. 12/26, heparin held overnight. Pending VATS evacuation of hematoma later today.  Today's heparin level is subtherapeutic as expected giving holding infusion overnight. Pharmacy will continue to follow for resuming IV heparin.  Goal of Therapy:  Heparin level 0.3-0.7 units/ml Monitor platelets by anticoagulation protocol: Yes   Plan:  IV heparin held due to bleeding per Dr. Vickey Sages Monitor CBC, bleeding  Vickey Sages, PharmD PGY-1 Pharmacy Resident 05/11/2020 9:31 AM Please see AMION for all pharmacy numbers

## 2020-05-12 ENCOUNTER — Inpatient Hospital Stay (HOSPITAL_COMMUNITY): Payer: Medicare Other | Admitting: Certified Registered"

## 2020-05-12 ENCOUNTER — Encounter (HOSPITAL_COMMUNITY): Payer: Self-pay | Admitting: Internal Medicine

## 2020-05-12 ENCOUNTER — Encounter (HOSPITAL_COMMUNITY)
Admission: AD | Disposition: A | Discharge status: Home / Self Care | Payer: Self-pay | Source: Other Acute Inpatient Hospital | Attending: Student

## 2020-05-12 ENCOUNTER — Inpatient Hospital Stay (HOSPITAL_COMMUNITY): Payer: Medicare Other

## 2020-05-12 DIAGNOSIS — U071 COVID-19: Secondary | ICD-10-CM

## 2020-05-12 DIAGNOSIS — I2693 Single subsegmental pulmonary embolism without acute cor pulmonale: Secondary | ICD-10-CM | POA: Diagnosis not present

## 2020-05-12 DIAGNOSIS — J15212 Pneumonia due to Methicillin resistant Staphylococcus aureus: Secondary | ICD-10-CM | POA: Diagnosis not present

## 2020-05-12 DIAGNOSIS — J9311 Primary spontaneous pneumothorax: Secondary | ICD-10-CM | POA: Diagnosis not present

## 2020-05-12 DIAGNOSIS — J9383 Other pneumothorax: Secondary | ICD-10-CM | POA: Diagnosis not present

## 2020-05-12 DIAGNOSIS — J9601 Acute respiratory failure with hypoxia: Secondary | ICD-10-CM | POA: Diagnosis not present

## 2020-05-12 HISTORY — PX: HEMATOMA EVACUATION: SHX5118

## 2020-05-12 HISTORY — PX: VIDEO ASSISTED THORACOSCOPY (VATS)/THOROCOTOMY: SHX6173

## 2020-05-12 LAB — POCT I-STAT 7, (LYTES, BLD GAS, ICA,H+H)
Acid-Base Excess: 10 mmol/L — ABNORMAL HIGH (ref 0.0–2.0)
Acid-Base Excess: 10 mmol/L — ABNORMAL HIGH (ref 0.0–2.0)
Bicarbonate: 35.9 mmol/L — ABNORMAL HIGH (ref 20.0–28.0)
Bicarbonate: 36.1 mmol/L — ABNORMAL HIGH (ref 20.0–28.0)
Calcium, Ion: 1.23 mmol/L (ref 1.15–1.40)
Calcium, Ion: 1.17 mmol/L (ref 1.15–1.40)
HCT: 23 % — ABNORMAL LOW (ref 36.0–46.0)
HCT: 26 % — ABNORMAL LOW (ref 36.0–46.0)
Hemoglobin: 7.8 g/dL — ABNORMAL LOW (ref 12.0–15.0)
Hemoglobin: 8.8 g/dL — ABNORMAL LOW (ref 12.0–15.0)
O2 Saturation: 78 %
O2 Saturation: 99 %
Potassium: 4.6 mmol/L (ref 3.5–5.1)
Potassium: 4.9 mmol/L (ref 3.5–5.1)
Sodium: 140 mmol/L (ref 135–145)
Sodium: 138 mmol/L (ref 135–145)
TCO2: 38 mmol/L — ABNORMAL HIGH (ref 22–32)
TCO2: 38 mmol/L — ABNORMAL HIGH (ref 22–32)
pCO2 arterial: 59.6 mmHg — ABNORMAL HIGH (ref 32.0–48.0)
pCO2 arterial: 58.4 mmHg — ABNORMAL HIGH (ref 32.0–48.0)
pH, Arterial: 7.389 (ref 7.350–7.450)
pH, Arterial: 7.4 (ref 7.350–7.450)
pO2, Arterial: 45 mmHg — ABNORMAL LOW (ref 83.0–108.0)
pO2, Arterial: 166 mmHg — ABNORMAL HIGH (ref 83.0–108.0)

## 2020-05-12 LAB — RENAL FUNCTION PANEL
Albumin: 1.4 g/dL — ABNORMAL LOW (ref 3.5–5.0)
Anion gap: 7 (ref 5–15)
BUN: 21 mg/dL — ABNORMAL HIGH (ref 6–20)
CO2: 33 mmol/L — ABNORMAL HIGH (ref 22–32)
Calcium: 8 mg/dL — ABNORMAL LOW (ref 8.9–10.3)
Chloride: 99 mmol/L (ref 98–111)
Creatinine, Ser: 0.87 mg/dL (ref 0.44–1.00)
GFR, Estimated: >60 mL/min (ref >60)
Glucose, Bld: 169 mg/dL — ABNORMAL HIGH (ref 70–99)
Phosphorus: 3.4 mg/dL (ref 2.5–4.6)
Potassium: 4.8 mmol/L (ref 3.5–5.1)
Sodium: 139 mmol/L (ref 135–145)

## 2020-05-12 LAB — PREPARE RBC (CROSSMATCH)

## 2020-05-12 LAB — GLUCOSE, CAPILLARY
Glucose-Capillary: 108 mg/dL — ABNORMAL HIGH (ref 70–99)
Glucose-Capillary: 112 mg/dL — ABNORMAL HIGH (ref 70–99)
Glucose-Capillary: 132 mg/dL — ABNORMAL HIGH (ref 70–99)
Glucose-Capillary: 144 mg/dL — ABNORMAL HIGH (ref 70–99)
Glucose-Capillary: 153 mg/dL — ABNORMAL HIGH (ref 70–99)
Glucose-Capillary: 168 mg/dL — ABNORMAL HIGH (ref 70–99)

## 2020-05-12 LAB — CBC
HCT: 25 % — ABNORMAL LOW (ref 36.0–46.0)
Hemoglobin: 7.8 g/dL — ABNORMAL LOW (ref 12.0–15.0)
MCH: 28.2 pg (ref 26.0–34.0)
MCHC: 31.2 g/dL (ref 30.0–36.0)
MCV: 90.3 fL (ref 80.0–100.0)
Platelets: 113 10*3/uL — ABNORMAL LOW (ref 150–400)
RBC: 2.77 MIL/uL — ABNORMAL LOW (ref 3.87–5.11)
RDW: 16.9 % — ABNORMAL HIGH (ref 11.5–15.5)
WBC: 7.4 10*3/uL (ref 4.0–10.5)
nRBC: 0 % (ref 0.0–0.2)

## 2020-05-12 LAB — MAGNESIUM: Magnesium: 1.8 mg/dL (ref 1.7–2.4)

## 2020-05-12 SURGERY — VIDEO ASSISTED THORACOSCOPY (VATS)/THOROCOTOMY
Anesthesia: General | Site: Chest | Laterality: Right

## 2020-05-12 MED ORDER — FENTANYL CITRATE (PF) 250 MCG/5ML IJ SOLN
INTRAMUSCULAR | Status: DC | PRN
  Administered 2020-05-12 (×6): 50 ug via INTRAVENOUS
  Administered 2020-05-12: 100 ug via INTRAVENOUS

## 2020-05-12 MED ORDER — CHLORHEXIDINE GLUCONATE 0.12 % MT SOLN: 15.0000 mL | Freq: Once | OROMUCOSAL | Status: AC

## 2020-05-12 MED ORDER — OXYCODONE HCL 5 MG/5ML PO SOLN: 5.0000 mg | Freq: Once | ORAL | Status: DC | PRN

## 2020-05-12 MED ORDER — TRAMADOL HCL 50 MG PO TABS
50.0000 mg | ORAL_TABLET | Freq: Four times a day (QID) | ORAL | Status: DC | PRN
  Administered 2020-05-13 – 2020-06-09 (×12): 100 mg via ORAL
  Filled 2020-05-12 (×12): qty 2

## 2020-05-12 MED ORDER — FENTANYL CITRATE (PF) 100 MCG/2ML IJ SOLN
25.0000 ug | INTRAMUSCULAR | Status: DC | PRN
  Administered 2020-05-12: 25 ug via INTRAVENOUS
  Administered 2020-05-12 – 2020-05-13 (×6): 50 ug via INTRAVENOUS
  Filled 2020-05-12 (×7): qty 2

## 2020-05-12 MED ORDER — LACTATED RINGERS IV SOLN: INTRAVENOUS | Status: DC | PRN

## 2020-05-12 MED ORDER — 0.9 % SODIUM CHLORIDE (POUR BTL) OPTIME
TOPICAL | Status: DC | PRN
  Administered 2020-05-12: 2000 mL

## 2020-05-12 MED ORDER — CHLORHEXIDINE GLUCONATE 0.12 % MT SOLN
OROMUCOSAL | Status: AC
  Administered 2020-05-12: 15 mL
  Filled 2020-05-12: qty 15

## 2020-05-12 MED ORDER — FENTANYL CITRATE (PF) 100 MCG/2ML IJ SOLN
INTRAMUSCULAR | Status: AC
  Filled 2020-05-12: qty 2

## 2020-05-12 MED ORDER — LIDOCAINE 5 % EX PTCH
1.0000 | MEDICATED_PATCH | CUTANEOUS | Status: DC
  Administered 2020-05-13 – 2020-05-24 (×5): 1 via TRANSDERMAL
  Filled 2020-05-12 (×15): qty 1

## 2020-05-12 MED ORDER — PHENYLEPHRINE HCL-NACL 10-0.9 MG/250ML-% IV SOLN
INTRAVENOUS | Status: DC | PRN
  Administered 2020-05-12: 40 ug/min via INTRAVENOUS

## 2020-05-12 MED ORDER — HYDROMORPHONE HCL 1 MG/ML IJ SOLN: 0.2500 mg | INTRAMUSCULAR | Status: DC | PRN

## 2020-05-12 MED ORDER — PROPOFOL 10 MG/ML IV BOLUS
INTRAVENOUS | Status: DC | PRN
  Administered 2020-05-12: 80 mg via INTRAVENOUS

## 2020-05-12 MED ORDER — ACETAMINOPHEN 500 MG PO TABS
1000.0000 mg | ORAL_TABLET | Freq: Four times a day (QID) | ORAL | Status: AC
  Administered 2020-05-13 – 2020-05-16 (×11): 1000 mg via ORAL
  Filled 2020-05-12 (×11): qty 2

## 2020-05-12 MED ORDER — ROCURONIUM BROMIDE 10 MG/ML (PF) SYRINGE
PREFILLED_SYRINGE | INTRAVENOUS | Status: DC | PRN
  Administered 2020-05-12: 60 mg via INTRAVENOUS
  Administered 2020-05-12: 20 mg via INTRAVENOUS

## 2020-05-12 MED ORDER — ACETAMINOPHEN 160 MG/5ML PO SOLN: 1000.0000 mg | Freq: Four times a day (QID) | ORAL | Status: AC

## 2020-05-12 MED ORDER — MIDAZOLAM HCL 2 MG/2ML IJ SOLN
INTRAMUSCULAR | Status: AC
  Filled 2020-05-12: qty 2

## 2020-05-12 MED ORDER — MIDAZOLAM HCL 2 MG/2ML IJ SOLN: 0.5000 mg | Freq: Once | INTRAMUSCULAR | Status: DC | PRN

## 2020-05-12 MED ORDER — SUGAMMADEX SODIUM 200 MG/2ML IV SOLN
INTRAVENOUS | Status: DC | PRN
  Administered 2020-05-12: 230 mg via INTRAVENOUS

## 2020-05-12 MED ORDER — ALBUTEROL SULFATE (2.5 MG/3ML) 0.083% IN NEBU
2.5000 mg | INHALATION_SOLUTION | RESPIRATORY_TRACT | Status: DC
  Administered 2020-05-13: 2.5 mg via RESPIRATORY_TRACT
  Filled 2020-05-12: qty 3

## 2020-05-12 MED ORDER — SODIUM CHLORIDE 0.9 % IV SOLN: 10.0000 mL/h | Freq: Once | INTRAVENOUS | Status: DC

## 2020-05-12 MED ORDER — DEXTROSE 50 % IV SOLN: 0.0000 mL | INTRAVENOUS | Status: DC | PRN

## 2020-05-12 MED ORDER — PROMETHAZINE HCL 25 MG/ML IJ SOLN: 6.2500 mg | INTRAMUSCULAR | Status: DC | PRN

## 2020-05-12 MED ORDER — CHLORHEXIDINE GLUCONATE CLOTH 2 % EX PADS
6.0000 | MEDICATED_PAD | Freq: Every day | CUTANEOUS | Status: DC
  Administered 2020-05-12 – 2020-06-10 (×30): 6 via TOPICAL

## 2020-05-12 MED ORDER — BUPIVACAINE-EPINEPHRINE 0.5% -1:200000 IJ SOLN
INTRAMUSCULAR | Status: AC
  Filled 2020-05-12: qty 1

## 2020-05-12 MED ORDER — ORAL CARE MOUTH RINSE: 15.0000 mL | Freq: Once | OROMUCOSAL | Status: AC

## 2020-05-12 MED ORDER — MIDAZOLAM HCL 5 MG/5ML IJ SOLN
INTRAMUSCULAR | Status: DC | PRN
  Administered 2020-05-12 (×2): 2 mg via INTRAVENOUS

## 2020-05-12 MED ORDER — ALBUTEROL SULFATE (2.5 MG/3ML) 0.083% IN NEBU: 2.5000 mg | INHALATION_SOLUTION | RESPIRATORY_TRACT | Status: DC

## 2020-05-12 MED ORDER — BISACODYL 5 MG PO TBEC
10.0000 mg | DELAYED_RELEASE_TABLET | Freq: Every day | ORAL | Status: DC
  Administered 2020-05-13 – 2020-06-10 (×25): 10 mg via ORAL
  Filled 2020-05-12 (×27): qty 2

## 2020-05-12 MED ORDER — FENTANYL CITRATE (PF) 250 MCG/5ML IJ SOLN
INTRAMUSCULAR | Status: AC
  Filled 2020-05-12: qty 5

## 2020-05-12 MED ORDER — INSULIN ASPART 100 UNIT/ML ~~LOC~~ SOLN
0.0000 [IU] | SUBCUTANEOUS | Status: DC
  Administered 2020-05-12: 4 [IU] via SUBCUTANEOUS
  Administered 2020-05-13: 2 [IU] via SUBCUTANEOUS
  Administered 2020-05-13 (×2): 8 [IU] via SUBCUTANEOUS
  Administered 2020-05-14: 4 [IU] via SUBCUTANEOUS
  Administered 2020-05-14 (×2): 8 [IU] via SUBCUTANEOUS
  Administered 2020-05-14: 2 [IU] via SUBCUTANEOUS

## 2020-05-12 MED ORDER — ONDANSETRON HCL 4 MG/2ML IJ SOLN
INTRAMUSCULAR | Status: DC | PRN
  Administered 2020-05-12: 4 mg via INTRAVENOUS

## 2020-05-12 MED ORDER — INSULIN REGULAR(HUMAN) IN NACL 100-0.9 UT/100ML-% IV SOLN
INTRAVENOUS | Status: DC
  Filled 2020-05-12: qty 100

## 2020-05-12 MED ORDER — MEPERIDINE HCL 25 MG/ML IJ SOLN
INTRAMUSCULAR | Status: AC
  Filled 2020-05-12: qty 1

## 2020-05-12 MED ORDER — MEPERIDINE HCL 25 MG/ML IJ SOLN
6.2500 mg | INTRAMUSCULAR | Status: DC | PRN
  Administered 2020-05-12: 12.5 mg via INTRAVENOUS

## 2020-05-12 MED ORDER — OXYCODONE HCL 5 MG PO TABS
5.0000 mg | ORAL_TABLET | ORAL | Status: DC | PRN
  Administered 2020-05-13 – 2020-06-01 (×29): 10 mg via ORAL
  Administered 2020-06-01: 5 mg via ORAL
  Administered 2020-06-01 – 2020-06-07 (×29): 10 mg via ORAL
  Administered 2020-06-07: 5 mg via ORAL
  Administered 2020-06-07 – 2020-06-08 (×6): 10 mg via ORAL
  Administered 2020-06-08: 5 mg via ORAL
  Administered 2020-06-09 – 2020-06-10 (×7): 10 mg via ORAL
  Filled 2020-05-12 (×26): qty 2
  Filled 2020-05-12: qty 1
  Filled 2020-05-12 (×4): qty 2
  Filled 2020-05-12: qty 1
  Filled 2020-05-12 (×5): qty 2
  Filled 2020-05-12: qty 1
  Filled 2020-05-12 (×5): qty 2
  Filled 2020-05-12 (×3): qty 1
  Filled 2020-05-12 (×30): qty 2

## 2020-05-12 MED ORDER — LACTATED RINGERS IV SOLN: INTRAVENOUS | Status: DC

## 2020-05-12 MED ORDER — SODIUM CHLORIDE 0.9 % IV SOLN: INTRAVENOUS | Status: DC | PRN

## 2020-05-12 MED ORDER — OXYCODONE HCL 5 MG PO TABS: 5.0000 mg | ORAL_TABLET | Freq: Once | ORAL | Status: DC | PRN

## 2020-05-12 MED ORDER — LIDOCAINE 2% (20 MG/ML) 5 ML SYRINGE
INTRAMUSCULAR | Status: DC | PRN
  Administered 2020-05-12: 20 mg via INTRAVENOUS

## 2020-05-12 MED ORDER — ONDANSETRON HCL 4 MG/2ML IJ SOLN: 4.0000 mg | Freq: Four times a day (QID) | INTRAMUSCULAR | Status: DC | PRN

## 2020-05-12 MED ORDER — BUPIVACAINE LIPOSOME 1.3 % IJ SUSP
20.0000 mL | Freq: Once | INTRAMUSCULAR | Status: DC
  Filled 2020-05-12: qty 20

## 2020-05-12 MED ORDER — BUPIVACAINE-EPINEPHRINE (PF) 0.5% -1:200000 IJ SOLN
INTRAMUSCULAR | Status: DC | PRN
  Administered 2020-05-12: 30 mL via PERINEURAL

## 2020-05-12 MED ORDER — SENNOSIDES-DOCUSATE SODIUM 8.6-50 MG PO TABS
1.0000 | ORAL_TABLET | Freq: Every day | ORAL | Status: DC
  Administered 2020-05-12 – 2020-06-09 (×27): 1 via ORAL
  Filled 2020-05-12 (×28): qty 1

## 2020-05-12 MED ORDER — BUPIVACAINE HCL (PF) 0.5 % IJ SOLN
INTRAMUSCULAR | Status: AC
  Filled 2020-05-12: qty 30

## 2020-05-12 MED ORDER — SODIUM CHLORIDE 0.45 % IV SOLN: INTRAVENOUS | Status: DC

## 2020-05-12 MED ORDER — ACETAMINOPHEN 10 MG/ML IV SOLN
1000.0000 mg | Freq: Four times a day (QID) | INTRAVENOUS | Status: DC
  Administered 2020-05-12: 1000 mg via INTRAVENOUS
  Filled 2020-05-12 (×3): qty 100

## 2020-05-12 MED ORDER — TALC (STERITALC) POWDER FOR INTRAPLEURAL USE
INTRAPLEURAL | Status: AC
  Filled 2020-05-12: qty 4

## 2020-05-12 MED ORDER — BUPIVACAINE LIPOSOME 1.3 % IJ SUSP
INTRAMUSCULAR | Status: DC | PRN
  Administered 2020-05-12: 20 mL

## 2020-05-12 MED ORDER — SODIUM CHLORIDE 0.9% IV SOLUTION: Freq: Once | INTRAVENOUS | Status: DC

## 2020-05-12 SURGICAL SUPPLY — 100 items
BLADE CLIPPER SURG (BLADE) IMPLANT
CANISTER SUCT 3000ML PPV (MISCELLANEOUS) ×4 IMPLANT
CANISTER WOUND CARE 500ML ATS (WOUND CARE) ×1 IMPLANT
CATH ROBINSON RED A/P 18FR (CATHETERS) ×1 IMPLANT
CLIP VESOCCLUDE MED 6/CT (CLIP) IMPLANT
CNTNR URN SCR LID CUP LEK RST (MISCELLANEOUS) ×2 IMPLANT
CONN ST 1/4X3/8  BEN (MISCELLANEOUS)
CONN ST 1/4X3/8 BEN (MISCELLANEOUS) IMPLANT
CONN Y 3/8X3/8X3/8  BEN (MISCELLANEOUS) ×1
CONN Y 3/8X3/8X3/8 BEN (MISCELLANEOUS) IMPLANT
CONNECTOR 5 IN 1 STRAIGHT STRL (MISCELLANEOUS) ×2 IMPLANT
CONT SPEC 4OZ STRL OR WHT (MISCELLANEOUS) ×2
COVER SURGICAL LIGHT HANDLE (MISCELLANEOUS) ×2 IMPLANT
DECANTER SPIKE VIAL GLASS SM (MISCELLANEOUS) ×1 IMPLANT
DEFOGGER ANTIFOG KIT (MISCELLANEOUS) ×1 IMPLANT
DEFOGGER SCOPE WARMER CLEARIFY (MISCELLANEOUS) ×1 IMPLANT
DERMABOND ADHESIVE PROPEN (GAUZE/BANDAGES/DRESSINGS) ×1
DERMABOND ADVANCED (GAUZE/BANDAGES/DRESSINGS) ×1
DERMABOND ADVANCED .7 DNX12 (GAUZE/BANDAGES/DRESSINGS) ×1 IMPLANT
DERMABOND ADVANCED .7 DNX6 (GAUZE/BANDAGES/DRESSINGS) IMPLANT
DRAIN CHANNEL 28F RND 3/8 FF (WOUND CARE) ×2 IMPLANT
DRAPE CV SPLIT W-CLR ANES SCRN (DRAPES) ×2 IMPLANT
DRAPE ORTHO SPLIT 77X108 STRL (DRAPES) ×1
DRAPE SURG ORHT 6 SPLT 77X108 (DRAPES) ×1 IMPLANT
DRAPE WARM FLUID 44X44 (DRAPES) IMPLANT
DRSG VAC ATS MED SENSATRAC (GAUZE/BANDAGES/DRESSINGS) ×1 IMPLANT
ELECT BLADE 4.0 EZ CLEAN MEGAD (MISCELLANEOUS) ×4
ELECT BLADE 6.5 EXT (BLADE) ×2 IMPLANT
ELECT REM PT RETURN 9FT ADLT (ELECTROSURGICAL) ×2
ELECTRODE BLDE 4.0 EZ CLN MEGD (MISCELLANEOUS) IMPLANT
ELECTRODE REM PT RTRN 9FT ADLT (ELECTROSURGICAL) ×1 IMPLANT
GAUZE SPONGE 4X4 12PLY STRL (GAUZE/BANDAGES/DRESSINGS) ×1 IMPLANT
GLOVE NEODERM STRL 7.5 LF PF (GLOVE) ×2 IMPLANT
GLOVE SURG NEODERM 7.5  LF PF (GLOVE) ×2
GLOVE SURG SS PI 7.5 STRL IVOR (GLOVE) ×1 IMPLANT
GOWN STRL REUS W/ TWL LRG LVL3 (GOWN DISPOSABLE) ×2 IMPLANT
GOWN STRL REUS W/TWL LRG LVL3 (GOWN DISPOSABLE) ×2
HEMOSTAT SURGICEL 2X14 (HEMOSTASIS) IMPLANT
KIT BASIN OR (CUSTOM PROCEDURE TRAY) ×2 IMPLANT
KIT SUCTION CATH 14FR (SUCTIONS) ×3 IMPLANT
KIT TURNOVER KIT B (KITS) ×2 IMPLANT
NDL HYPO 25GX1X1/2 BEV (NEEDLE) IMPLANT
NDL SPNL 20GX3.5 QUINCKE YW (NEEDLE) IMPLANT
NEEDLE HYPO 25GX1X1/2 BEV (NEEDLE) ×2 IMPLANT
NEEDLE SPNL 20GX3.5 QUINCKE YW (NEEDLE) ×2 IMPLANT
NS IRRIG 1000ML POUR BTL (IV SOLUTION) ×6 IMPLANT
PACK CHEST (CUSTOM PROCEDURE TRAY) ×2 IMPLANT
PAD ARMBOARD 7.5X6 YLW CONV (MISCELLANEOUS) ×4 IMPLANT
POUCH ENDO CATCH II 15MM (MISCELLANEOUS) IMPLANT
POUCH SPECIMEN RETRIEVAL 10MM (ENDOMECHANICALS) IMPLANT
SCISSORS LAP 5X35 DISP (ENDOMECHANICALS) IMPLANT
SEALANT PROGEL (MISCELLANEOUS) IMPLANT
SEALANT SURG COSEAL 4ML (VASCULAR PRODUCTS) IMPLANT
SEALANT SURG COSEAL 8ML (VASCULAR PRODUCTS) IMPLANT
SET IRRIG TUBING LAPAROSCOPIC (IRRIGATION / IRRIGATOR) ×1 IMPLANT
SHEARS HARMONIC HDI 20CM (ELECTROSURGICAL) IMPLANT
SOL ANTI FOG 6CC (MISCELLANEOUS) ×1 IMPLANT
SOLUTION ANTI FOG 6CC (MISCELLANEOUS) ×1
SPONGE INTESTINAL PEANUT (DISPOSABLE) ×2 IMPLANT
SPONGE TONSIL TAPE 1 RFD (DISPOSABLE) IMPLANT
STAPLER VISISTAT 35W (STAPLE) ×1 IMPLANT
SUT MNCRL AB 3-0 PS2 18 (SUTURE) ×2 IMPLANT
SUT MNCRL AB 4-0 PS2 18 (SUTURE) ×3 IMPLANT
SUT PDS AB 1 CTX 36 (SUTURE) ×2 IMPLANT
SUT PROLENE 4 0 RB 1 (SUTURE)
SUT PROLENE 4-0 RB1 .5 CRCL 36 (SUTURE) IMPLANT
SUT SILK  1 MH (SUTURE) ×5
SUT SILK 1 MH (SUTURE) ×2 IMPLANT
SUT SILK 1 TIES 10X30 (SUTURE) IMPLANT
SUT SILK 2 0 SH (SUTURE) IMPLANT
SUT SILK 2 0SH CR/8 30 (SUTURE) ×1 IMPLANT
SUT SILK 3 0 SH 30 (SUTURE) IMPLANT
SUT SILK 3 0SH CR/8 30 (SUTURE) ×2 IMPLANT
SUT VIC AB 0 CT1 27 (SUTURE) ×1
SUT VIC AB 0 CT1 27XBRD ANBCTR (SUTURE) ×1 IMPLANT
SUT VIC AB 0 CTX 27 (SUTURE) IMPLANT
SUT VIC AB 1 CTX 27 (SUTURE) IMPLANT
SUT VIC AB 2-0 CT1 27 (SUTURE) ×1
SUT VIC AB 2-0 CT1 TAPERPNT 27 (SUTURE) IMPLANT
SUT VIC AB 2-0 CTX 36 (SUTURE) ×4 IMPLANT
SUT VIC AB 3-0 MH 27 (SUTURE) IMPLANT
SUT VIC AB 3-0 SH 27 (SUTURE)
SUT VIC AB 3-0 SH 27X BRD (SUTURE) IMPLANT
SUT VIC AB 3-0 X1 27 (SUTURE) IMPLANT
SUT VICRYL 0 UR6 27IN ABS (SUTURE) ×1 IMPLANT
SUT VICRYL 2 TP 1 (SUTURE) IMPLANT
SWABSTK COMLB BENZOIN TINCTURE (MISCELLANEOUS) ×1 IMPLANT
SYR 30ML LL (SYRINGE) ×3 IMPLANT
SYR BULB IRRIG 60ML STRL (SYRINGE) ×2 IMPLANT
SYSTEM SAHARA CHEST DRAIN ATS (WOUND CARE) ×2 IMPLANT
TAPE CLOTH SURG 4X10 WHT LF (GAUZE/BANDAGES/DRESSINGS) ×1 IMPLANT
TIP APPLICATOR SPRAY EXTEND 16 (VASCULAR PRODUCTS) IMPLANT
TOWEL GREEN STERILE (TOWEL DISPOSABLE) ×2 IMPLANT
TOWEL GREEN STERILE FF (TOWEL DISPOSABLE) ×2 IMPLANT
TRAY FOLEY MTR SLVR 14FR STAT (SET/KITS/TRAYS/PACK) ×1 IMPLANT
TRAY FOLEY MTR SLVR 16FR STAT (SET/KITS/TRAYS/PACK) ×1 IMPLANT
TROCAR XCEL BLADELESS 5X75MML (TROCAR) ×2 IMPLANT
TROCAR XCEL NON-BLD 11X100MML (ENDOMECHANICALS) ×1 IMPLANT
TROCAR XCEL NON-BLD 5MMX100MML (ENDOMECHANICALS) IMPLANT
WATER STERILE IRR 1000ML POUR (IV SOLUTION) ×4 IMPLANT

## 2020-05-12 NOTE — Brief Op Note (Signed)
05/07/2020 - 05/12/2020  2:08 PM  PATIENT:  Annette [name]  59 y.o. female  PRE-OPERATIVE DIAGNOSIS:  Pneumothorax  POST-OPERATIVE DIAGNOSIS:  Pneumothorax  PROCEDURE:  Procedure(s):  VIDEO ASSISTED THORACOSCOPY -Drainage of Hemothorax -Mechanical Pleurodesis  EVACUATION OF CHEST WALL HEMATOMA -Placement of wound vac   SURGEON:  Surgeon(s) and Role:    * Atkins, Merri Brunette, MD - Primary  PHYSICIAN ASSISTANT: Lowella Dandy PA-C  ANESTHESIA:   general  EBL:  200 mL   BLOOD ADMINISTERED: 1 unit PRBC  DRAINS: 2 28 straight chest tubes, 1 28 right angle   LOCAL MEDICATIONS USED:  BUPIVICAINE   SPECIMEN:  No Specimen  DISPOSITION OF SPECIMEN:  N/A  COUNTS:  YES  TOURNIQUET:  * No tourniquets in log *  DICTATION: .Dragon Dictation  PLAN OF CARE: Admit to inpatient   PATIENT DISPOSITION:  ICU - intubated and hemodynamically stable.   Delay start of Pharmacological VTE agent (>24hrs) due to surgical blood loss or risk of bleeding: yes

## 2020-05-12 NOTE — Progress Notes (Signed)
PROGRESS NOTE  Annette [name] GQB:[id number] DOB: 22-May-1961   PCP: Patient, No Pcp Per  Patient is from: Home  DOA: 05/07/2020 LOS: 5  Brief Narrative / Interim history: 66 yr F with recent COVID-19 about 2 months ago, recent PE on Eliquis, COPD/Asthma not on oxygen, DM-2, HTN and HLD who was admitted to [facility] on 05/03/2020 where she was admitted for RUL MRSA pneumonia and RLL subsegmental PE (TTE with mod PAH but no right heart strain) treated with vancomycin, then Zyvox,  and Lovenox.  She developed right-sided hydropneumothorax on 11/22 underwent chest tube insertion on 05/06/2020 and transferred to [facility] for CTS evaluation and treatment.   On arrival here, somewhat somnolent but hemodynamically stable.  Saturating in upper 90s on 3 L by .  Bicarb 34.  Glucose 282.  Albumin 1.8.  WBC 12.2 with left shift.  Platelets 129.  Otherwise, CBC and CMP without significant finding.  Pro-Cal and lactic acid negative.  Cultures obtained.  Neurosurgery consulted and recommended CT chest.  CT chest without contrast with moderately large right pneumothorax, subcutaneous emphysema throughout the right chest wall extending into right neck, pneumomediastinum, right upper lobe opacity and emphysema.  IR consulted by CTS and placed additional chest tube to right chest on 05/08/2020.  Pneumothorax nearly resolved but persistent right lung opacity on chest x-ray.  IV Unasyn added on 11/26.  Patient had significant bleeding around the chest tube.  Hemoglobin dropped 3 g.  Heparin held.  CTA chest with new nonocclusive PE in RLL, persistent moderate right-sided pneumothorax with mediastinal shift to the left, new moderate sized right-sided pneumothorax, changed chest tube possibly causing pulmonary laceration and new large right flank hematoma measuring 12 cm without definite evidence of active extravasation, diffuse wall thickening of gallbladder, coarse airspace opacities and pulmonary  fibrosis. Patient received 2 units of blood.  Hgb improved to 8.5>> 7.8  Patient underwent VATS procedure this afternoon (11/28).  Concerned about respiratory status.  CTS sending patient to ICU.  Subjective: Seen and examined earlier this morning before she went down for VATS procedure.  No major events overnight or this morning.  She is very anxious about the procedure.  Continues to endorse significant right-sided pain.   Objective: Vitals:   05/12/20 0850 05/12/20 0900 05/12/20 1000 05/12/20 1100  BP:  103/62 116/71 (!) 110/58  Pulse: 92 90 95 84  Resp: (!) 26 (!) 22 (!) 23 (!) 22  Temp:  98.2 F (36.8 C)    TempSrc:  Oral    SpO2: 98% 100% 97% 100%  Weight:      Height:        Intake/Output Summary (Last 24 hours) at 05/12/2020 1545 Last data filed at 05/12/2020 1514 Gross per 24 hour  Intake 990 ml  Output 1150 ml  Net -160 ml   Filed Weights   05/08/20 0414 05/09/20 0526 05/12/20 0420  Weight: 118.8 kg 113.7 kg 111.8 kg    Examination:  GENERAL: No apparent distress.  Nontoxic. HEENT: MMM.  Vision and hearing grossly intact.  NECK: Supple.  No apparent JVD.  RESP: 100% on 2 L.  No IWOB.  Rhonchi bilaterally.  Chest tube with serosanguineous output. CVS:  RRR. Heart sounds normal.  ABD/GI/GU: BS+. Abd soft, NTND.  MSK/EXT:  Moves extremities. No apparent deformity. No edema.  SKIN: Dressing over chest tube DCI. NEURO: Awake, alert and oriented appropriately.  No apparent focal neuro deficit. PSYCH: Calm.  Anxious about the procedure today.  Procedures:  05/06/2020-right chest tube placement at outside hospital 05/08/2020-second right chest tube placement by IR  05/12/2020-VATS procedure   Microbiology summarized: Blood cultures NGTD. MRSA PCR positive.  Assessment & Plan: Right upper lobe MRSA pneumonia: No fever but leukocytosis.  CXR with near resolution of pneumothorax but extensive RUL consolidation and additional bilateral lung opacities as above.   Procalcitonin 0.18.  MRSA PCR positive. -Vancomycin 11/19-11/21>> linezolid 11/21>> -Added IV Unasyn 11/26>>. -Monitor procalcitonin  Acute RLL subsegmental PE in patient with recent prior PE: TTE at outside hospital with moderate PAH but no right heart strain. -Heparin on hold due to hematoma and bleeding around the chest tube.  Will resume when okay by CTS.  Right-sided  pneumohemothorax with mediastinal shift-significant output from chest tube. -S/p VATS today. -Pain control with IV analgesics -Daily chest x-ray -Bowel regimen  Acute blood loss anemia: Hgb 11>> 7.7>2u> 8.5> 7.9> 7.8.  Likely due to hemothorax and hematoma. New large right flank hematoma: Approximately 12 cm.  No definite active arterial extravasation -S/p VATs procedure today -We will resume heparin when okay from CTS standpoint -Monitor H&H -Transfuse for Hgb less than 7.0  Acute hypoxemic respiratory failure: Might remain intubated postop per CTS. -Being transferred to ICU by CTS.  Chronic COPD: Stable -DuoNebs as needed  Hyperlipidemia -Continue Lipitor and fenofibrate  Uncontrolled noninsulin-dependent type II diabetes with hyperglycemia: A1c 10.7%. Recent Labs  Lab 05/11/20 1120 05/11/20 1718 05/11/20 2118 05/12/20 0750 05/12/20 1151  GLUCAP 188* 161* 203* 144* 112*  -Continue SSI-moderate -Continue NovoLog 8 units AC -Continue Lantus 25 units twice daily -Continue home statin.  GERD -Continue PPI  Anxiety: Stable -Continue home Klonopin  Neuropathy/chronic pain -Continue home Lyrica  History of COVID-19 infection: Treated for this about 2 months ago.  Leukocytosis/bandemia: Likely due to #1.  Resolved. -Continue monitoring  Thrombocytopenia: Platelet 126> 116> 113 -Continue monitoring  Intertrigo/cutaneous candidiasis -Diflucan 150 mg every 72 hours x2 -Topical nystatin powder  Morbid obesity: Body mass index is 37.46 kg/m. Nutrition Problem: Increased nutrient  needs Etiology: wound healing, acute illness (recent COVID PNA) Signs/Symptoms: estimated needs Interventions: MVI, Ensure Enlive (each supplement provides 350kcal and 20 grams of protein)   Stage II pressure skin injury: POA Pressure Injury 05/07/20 Coccyx Medial;Mid Stage 2 -  Partial thickness loss of dermis presenting as a shallow open injury with a red, pink wound bed without slough. (Active)  05/07/20 2100  Location: Coccyx  Location Orientation: Medial;Mid  Staging: Stage 2 -  Partial thickness loss of dermis presenting as a shallow open injury with a red, pink wound bed without slough.  Wound Description (Comments):   Present on Admission: Yes   DVT prophylaxis:  Off IV heparin due to acute blood loss  Code Status: Full code Family Communication: Patient and/or RN. Available if any question.  Status is: Inpatient  Remains inpatient appropriate because:Hemodynamically unstable, Ongoing diagnostic testing needed not appropriate for outpatient work up, Unsafe d/c plan, IV treatments appropriate due to intensity of illness or inability to take PO and Inpatient level of care appropriate due to severity of illness   Dispo: The patient is from: Home              Anticipated d/c is to: To be determined              Anticipated d/c date is: > 3 days              Patient currently is not medically stable to d/c.  Consultants:  Cardiothoracic surgery Interventional radiology   Sch Meds:  Scheduled Meds: . sodium chloride   Intravenous Once  . [MAR Hold] acetaminophen  1,000 mg Oral Q8H  . [MAR Hold] acetaminophen  650 mg Oral Once  . [MAR Hold] atorvastatin  40 mg Oral Daily  . bupivacaine liposome  20 mL Infiltration Once  . [MAR Hold] Chlorhexidine Gluconate Cloth  6 each Topical Q0600  . [MAR Hold] cholecalciferol  1,000 Units Oral Daily  . [MAR Hold] clonazePAM  0.5 mg Oral BID  . [MAR Hold] fenofibrate  160 mg Oral Daily  . [MAR Hold] fluconazole  150 mg Oral  Q72H  . [MAR Hold] insulin aspart  0-15 Units Subcutaneous TID WC  . [MAR Hold] insulin aspart  0-5 Units Subcutaneous QHS  . [MAR Hold] insulin aspart  8 Units Subcutaneous TID WC  . [MAR Hold] insulin glargine  25 Units Subcutaneous QHS  . [MAR Hold] linezolid  600 mg Oral Q12H  . [MAR Hold] multivitamin with minerals  1 tablet Oral Daily  . [MAR Hold] mupirocin ointment  1 application Nasal BID  . [MAR Hold] nystatin   Topical BID  . [MAR Hold] pantoprazole  40 mg Oral BID  . [MAR Hold] pregabalin  150 mg Oral TID  . [MAR Hold] Ensure Max Protein  11 oz Oral BID   Continuous Infusions: . sodium chloride    . [MAR Hold] ampicillin-sulbactam (UNASYN) IV 3 g (05/12/20 0904)  . lactated ringers 10 mL/hr at 05/12/20 1255   PRN Meds:.0.9 % irrigation (POUR BTL), bupivacaine liposome, bupivacaine-epinephrine, influenza vac split quadrivalent PF, [MAR Hold] ipratropium-albuterol, [MAR Hold]  morphine injection, [MAR Hold] oxyCODONE, pneumococcal 23 valent vaccine  Antimicrobials: Anti-infectives (From admission, onward)   Start     Dose/Rate Route Frequency Ordered Stop   05/10/20 1300  [MAR Hold]  fluconazole (DIFLUCAN) tablet 150 mg        (MAR Hold since Sun 05/12/2020 at 1212.Hold Reason: Transfer to a Procedural area.)   150 mg Oral every 72 hours 05/10/20 1151 05/16/20 1259   05/10/20 0900  [MAR Hold]  Ampicillin-Sulbactam (UNASYN) 3 g in sodium chloride 0.9 % 100 mL IVPB        (MAR Hold since Sun 05/12/2020 at 1212.Hold Reason: Transfer to a Procedural area.)   3 g 200 mL/hr over 30 Minutes Intravenous Every 6 hours 05/10/20 0753 05/17/20 0859   05/08/20 0030  [MAR Hold]  linezolid (ZYVOX) tablet 600 mg        (MAR Hold since Sun 05/12/2020 at 1212.Hold Reason: Transfer to a Procedural area.)   600 mg Oral Every 12 hours 05/08/20 0015         I have personally reviewed the following labs and images: CBC: Recent Labs  Lab 05/09/20 0011 05/09/20 0011 05/10/20 0245  05/10/20 1730 05/10/20 2047 05/11/20 0747 05/11/20 1135 05/11/20 2218 05/12/20 0242  WBC 19.8*  --  15.8*  --   --  10.3 9.3  --  7.4  NEUTROABS  --   --  13.8*  --   --  8.4*  --   --   --   HGB 11.2*   < > 11.0*   < > 7.7* 8.5* 7.9* 8.0* 7.8*  HCT 36.0   < > 35.8*   < > 25.4* 27.3* 25.5* 25.3* 25.0*  MCV 91.8  --  93.0  --   --  90.4 88.5  --  90.3  PLT 128*  --  126*  --   --  127* 116*  --  113*   < > = values in this interval not displayed.   BMP &GFR Recent Labs  Lab 05/08/20 0016 05/09/20 0011 05/10/20 0245 05/11/20 1028 05/12/20 0242  NA 135 134* 137 137 139  K 4.6 4.5 4.7 4.3 4.8  CL 96* 96* 100 98 99  CO2 34* 30 33* 31 33*  GLUCOSE 282* 349* 255* 232* 169*  BUN 21* 23* 19 26* 21*  CREATININE 0.70 0.69 0.70 0.76 0.87  CALCIUM 8.3* 7.4* 8.0* 7.7* 8.0*  MG  --  1.7 1.8  --  1.8  PHOS  --  3.3 2.6  --  3.4   Estimated Creatinine Clearance: 90.2 mL/min (by C-G formula based on SCr of 0.87 mg/dL). Liver & Pancreas: Recent Labs  Lab 05/08/20 0016 05/09/20 0011 05/10/20 0245 05/11/20 1028 05/12/20 0242  AST 25  --   --  12*  --   ALT 34  --   --  13  --   ALKPHOS 51  --   --  41  --   BILITOT 0.8  --   --  0.5  --   PROT 4.2*  --   --  3.5*  --   ALBUMIN 1.8* 1.5* 1.4* 1.4* 1.4*   No results for input(s): LIPASE, AMYLASE in the last 168 hours. No results for input(s): AMMONIA in the last 168 hours. Diabetic: No results for input(s): HGBA1C in the last 72 hours. Recent Labs  Lab 05/11/20 1120 05/11/20 1718 05/11/20 2118 05/12/20 0750 05/12/20 1151  GLUCAP 188* 161* 203* 144* 112*   Cardiac Enzymes: No results for input(s): CKTOTAL, CKMB, CKMBINDEX, TROPONINI in the last 168 hours. No results for input(s): PROBNP in the last 8760 hours. Coagulation Profile: Recent Labs  Lab 05/11/20 1135  INR 1.0   Thyroid Function Tests: No results for input(s): TSH, T4TOTAL, FREET4, T3FREE, THYROIDAB in the last 72 hours. Lipid Profile: No results for  input(s): CHOL, HDL, LDLCALC, TRIG, CHOLHDL, LDLDIRECT in the last 72 hours. Anemia Panel: No results for input(s): VITAMINB12, FOLATE, FERRITIN, TIBC, IRON, RETICCTPCT in the last 72 hours. Urine analysis: No results found for: COLORURINE, APPEARANCEUR, LABSPEC, PHURINE, GLUCOSEU, HGBUR, BILIRUBINUR, KETONESUR, PROTEINUR, UROBILINOGEN, NITRITE, LEUKOCYTESUR Sepsis Labs: Invalid input(s): PROCALCITONIN, LACTICIDVEN  Microbiology: Recent Results (from the past 240 hour(s))  Culture, blood (routine x 2)     Status: None (Preliminary result)   Collection Time: 05/08/20 12:16 AM   Specimen: BLOOD  Result Value Ref Range Status   Specimen Description BLOOD LEFT HAND  Final   Special Requests   Final    BOTTLES DRAWN AEROBIC AND ANAEROBIC Blood Culture results may not be optimal due to an excessive volume of blood received in culture bottles   Culture   Final    NO GROWTH 4 DAYS Performed at [facility], [address], [place], [place] [address]    Report Status PENDING  Incomplete  Culture, blood (routine x 2)     Status: None (Preliminary result)   Collection Time: 05/08/20 12:16 AM   Specimen: BLOOD  Result Value Ref Range Status   Specimen Description BLOOD RIGHT ARM  Final   Special Requests   Final    BOTTLES DRAWN AEROBIC AND ANAEROBIC Blood Culture results may not be optimal due to an excessive volume of blood received in culture bottles   Culture   Final    NO GROWTH 4 DAYS Performed at [facility], [address] [place], [place] [address]    Report Status PENDING  Incomplete  Surgical pcr screen     Status: Abnormal   Collection Time: 05/11/20 11:55 AM   Specimen: Nasal Mucosa; Nasal Swab  Result Value Ref Range Status   MRSA, PCR POSITIVE (A) NEGATIVE Final    Comment: RESULT CALLED TO, READ BACK BY AND VERIFIED WITH: RN A TEPE [phone] MLM    Staphylococcus aureus POSITIVE (A) NEGATIVE Final    Comment: (NOTE) The Xpert SA Assay (FDA approved for  NASAL specimens in patients 60 years of age and older), is one component of a comprehensive surveillance program. It is not intended to diagnose infection nor to guide or monitor treatment. Performed at [facility], [address] [place], [place] [address]     Radiology Studies: No results found.   Der Gagliano T. Lukka Black [facility]  If 7PM-7AM, please contact night-coverage [email or web address] 05/12/2020, 3:45 PM

## 2020-05-12 NOTE — Transfer of Care (Signed)
Immediate Anesthesia Transfer of Care Note  Patient: Annette [name]  Procedure(s) Performed: VIDEO ASSISTED THORACOSCOPY (VATS)/THOROCOTOMY MECCHANICAL PLEURO DESIS (Right Chest) EVACUATION HEMATOMA (Right Chest)  Patient Location: PACU  Anesthesia Type:General  Level of Consciousness: awake, alert  and oriented  Airway & Oxygen Therapy: Patient Spontanous Breathing and Patient connected to face mask oxygen  Post-op Assessment: Report given to RN, Post -op Vital signs reviewed and stable and Patient moving all extremities  Post vital signs: Reviewed and stable  Last Vitals:  Vitals Value Taken Time  BP 123/78 05/12/20 1621  Temp 37 C 05/12/20 1605  Pulse 92 05/12/20 1625  Resp 18 05/12/20 1625  SpO2 100 % 05/12/20 1625  Vitals shown include unvalidated device data.  Last Pain:  Vitals:   05/12/20 1243  TempSrc:   PainSc: 6       Patients Stated Pain Goal: 2 (05/12/20 1243)  Complications: No complications documented.

## 2020-05-12 NOTE — Anesthesia Procedure Notes (Signed)
Procedure Name: Intubation Date/Time: 05/12/2020 2:05 PM Performed by: Amadeo Garnet, CRNA Pre-anesthesia Checklist: Patient identified, Emergency Drugs available, Suction available and Patient being monitored Patient Re-evaluated:Patient Re-evaluated prior to induction Oxygen Delivery Method: Circle system utilized Preoxygenation: Pre-oxygenation with 100% oxygen Induction Type: IV induction Ventilation: Mask ventilation without difficulty Laryngoscope Size: Mac and 3 Grade View: Grade I Endobronchial tube: Double lumen EBT, EBT position confirmed by auscultation and EBT position confirmed by fiberoptic bronchoscope and 37 Fr Number of attempts: 1 Airway Equipment and Method: Stylet and Oral airway Placement Confirmation: ETT inserted through vocal cords under direct vision,  positive ETCO2 and breath sounds checked- equal and bilateral Tube secured with: Tape Dental Injury: Teeth and Oropharynx as per pre-operative assessment

## 2020-05-12 NOTE — Progress Notes (Signed)
NAME:  Annette [name], MRN:  [id number], DOB:  [birth year], LOS: 5 ADMISSION DATE:  05/07/2020, CONSULTATION DATE:  11/228/2021 REFERRING MD:  Vickey Sages - TCTS, CHIEF COMPLAINT:  Right chest hematoma.   HPI/course in hospital  59 year old woman who is admitted to the CTICU following VATS evacuation of chest hematoma.   Recent COVID pneumonia.  She was transferred to [facility] on 11/23 from [facility] where she was admitted with a right upper lobe pneumonia due to MRSA. Also concurrently found to have a right lower lobe subsegmental PE. She was started on linezolid and heparin, but developed right hydropneumothorax 2 days later.   11/24 - IR directed chest tube placement with near complete resolution of the pneumothorax.  11/26 - began to bleed around chest tube with 3g/L HB drop. Transfused.  11/27 - stabilized following transfusion. 11/28 - OR for definitive management underwent video-assisted evacuation of hematoma and mechanical pleurodesis under general anesthesia with lung isolation.  Past Medical History   Past Medical History:  Diagnosis Date  . Anxiety   . Depression   . Diabetes mellitus without complication (HCC)    Type II  . Hypertension   . Pneumonia      Past Surgical History:  Procedure Laterality Date  . ABDOMINAL HYSTERECTOMY    . CARPAL TUNNEL RELEASE     Right     Review of Systems:   Review of Systems  Unable to perform ROS: Critical illness    Social History   reports that she has quit smoking. Her smoking use included cigarettes. She has never used smokeless tobacco. She reports previous alcohol use. She reports that she does not use drugs.   Family History   Her family history is not on file.   Allergies Allergies  Allergen Reactions  . Cymbalta [Duloxetine Hcl]     Psychosis "self-harm"     Home Medications  Prior to Admission medications   Medication Sig Start Date End Date Taking? Authorizing Provider  atorvastatin (LIPITOR) 40 MG tablet  Take 40 mg by mouth daily. 03/14/20  Yes [provider]  cholecalciferol (VITAMIN D3) 25 MCG (1000 UNIT) tablet Take 1,000 Units by mouth daily.   Yes [provider]  clonazePAM (KLONOPIN) 0.5 MG tablet Take 0.5 mg by mouth 2 (two) times daily.  04/11/20  Yes [provider]  fenofibrate 160 MG tablet Take 160 mg by mouth daily. 02/15/20  Yes [provider]  glipiZIDE (GLUCOTROL) 10 MG tablet Take 10 mg by mouth 2 (two) times daily. 12/20/19  Yes [provider]  montelukast (SINGULAIR) 10 MG tablet Take 10 mg by mouth daily. 03/14/20  Yes [provider]  omeprazole (PRILOSEC) 40 MG capsule Take 40 mg by mouth 2 (two) times daily. 02/18/20  Yes [provider]  pregabalin (LYRICA) 150 MG capsule Take 150 mg by mouth in the morning, at noon, and at bedtime.  04/15/20  Yes [provider]     Interim history/subjective:  Seen in recovery room.  Complaining of chest pain.  Some relief with 50 of fentanyl.  Objective   Blood pressure (!) 110/58, pulse 84, temperature 98.2 F (36.8 C), temperature source Oral, resp. rate (!) 22, height 5\' 8"  (1.727 m), weight 111.8 kg, SpO2 100 %.        Intake/Output Summary (Last 24 hours) at 05/12/2020 1544 Last data filed at 05/12/2020 1514 Gross per 24 hour  Intake 990 ml  Output 1150 ml  Net -160 ml  Filed Weights   05/08/20 0414 05/09/20 0526 05/12/20 0420  Weight: 118.8 kg 113.7 kg 111.8 kg     Examination: Physical Exam Constitutional:      General: She is in acute distress.     Appearance: She is obese.  HENT:     Head: Normocephalic and atraumatic.     Mouth/Throat:     Mouth: Mucous membranes are dry.  Neck:     Comments: Trachea is midline Cardiovascular:     Rate and Rhythm: Tachycardia present.     Heart sounds: Normal heart sounds.  Pulmonary:     Comments: Rapid shallow breaths.  Diffuse coarse crackles consistent with extensive subcutaneous emphysema  bilaterally. Chest:     Comments: Extensive hematoma right chest.  VAC dressing in place.  3 chest tubes draining red serosanguineous fluid. Abdominal:     Comments: Protuberant abdomen.  Soft.  Genitourinary:    Comments: Foley catheter in place. Musculoskeletal:        General: No swelling or deformity.  Skin:    General: Skin is warm.     Capillary Refill: Capillary refill takes 2 to 3 seconds.  Neurological:     General: No focal deficit present.      Ancillary tests (personally reviewed)  CBC: Recent Labs  Lab 05/09/20 0011 05/09/20 0011 05/10/20 0245 05/10/20 1730 05/10/20 2047 05/11/20 0747 05/11/20 1135 05/11/20 2218 05/12/20 0242  WBC 19.8*  --  15.8*  --   --  10.3 9.3  --  7.4  NEUTROABS  --   --  13.8*  --   --  8.4*  --   --   --   HGB 11.2*   < > 11.0*   < > 7.7* 8.5* 7.9* 8.0* 7.8*  HCT 36.0   < > 35.8*   < > 25.4* 27.3* 25.5* 25.3* 25.0*  MCV 91.8  --  93.0  --   --  90.4 88.5  --  90.3  PLT 128*  --  126*  --   --  127* 116*  --  113*   < > = values in this interval not displayed.    Basic Metabolic Panel: Recent Labs  Lab 05/08/20 0016 05/09/20 0011 05/10/20 0245 05/11/20 1028 05/12/20 0242  NA 135 134* 137 137 139  K 4.6 4.5 4.7 4.3 4.8  CL 96* 96* 100 98 99  CO2 34* 30 33* 31 33*  GLUCOSE 282* 349* 255* 232* 169*  BUN 21* 23* 19 26* 21*  CREATININE 0.70 0.69 0.70 0.76 0.87  CALCIUM 8.3* 7.4* 8.0* 7.7* 8.0*  MG  --  1.7 1.8  --  1.8  PHOS  --  3.3 2.6  --  3.4   GFR: Estimated Creatinine Clearance: 90.2 mL/min (by C-G formula based on SCr of 0.87 mg/dL). Recent Labs  Lab 05/08/20 0016 05/09/20 0011 05/10/20 0245 05/11/20 0747 05/11/20 1135 05/12/20 0242  PROCALCITON <0.10  --  0.18 0.26  --   --   WBC 12.2*   < > 15.8* 10.3 9.3 7.4  LATICACIDVEN 1.6  --   --   --   --   --    < > = values in this interval not displayed.    Liver Function Tests: Recent Labs  Lab 05/08/20 0016 05/09/20 0011 05/10/20 0245 05/11/20 1028  05/12/20 0242  AST 25  --   --  12*  --   ALT 34  --   --  13  --  ALKPHOS 51  --   --  41  --   BILITOT 0.8  --   --  0.5  --   PROT 4.2*  --   --  3.5*  --   ALBUMIN 1.8* 1.5* 1.4* 1.4* 1.4*   No results for input(s): LIPASE, AMYLASE in the last 168 hours. No results for input(s): AMMONIA in the last 168 hours.  ABG No results found for: PHART, PCO2ART, PO2ART, HCO3, TCO2, ACIDBASEDEF, O2SAT   Coagulation Profile: Recent Labs  Lab 05/11/20 1135  INR 1.0    Cardiac Enzymes: No results for input(s): CKTOTAL, CKMB, CKMBINDEX, TROPONINI in the last 168 hours.  HbA1C: Hgb A1c MFr Bld  Date/Time Value Ref Range Status  05/08/2020 12:16 AM 10.7 (H) 4.8 - 5.6 % Final    Comment:    (NOTE) Pre diabetes:          5.7%-6.4%  Diabetes:              >6.4%  Glycemic control for   <7.0% adults with diabetes     CBG: Recent Labs  Lab 05/11/20 1120 05/11/20 1718 05/11/20 2118 05/12/20 0750 05/12/20 1151  GLUCAP 188* 161* 203* 144* 112*   CHEST IMAGING REVIEW:  CT chest shows morbid obesity, extensive right subcutaneous emphysema, large right pneumothorax, right lung partial collapse and intrapleural hematoma.   Assessment & Plan:   Critically ill due to right chest hematoma and hydropneumothorax requiring VATS and pleurodesis.  Morbid obesity. -Optimize pain control with multimodal analgesia -Remains at high risk for reintubation  RLL subsegmental PE  - Resume IV heparin 12h post operatively if chest tube output acceptable. - Bilateral venous Dopplers to assess residual leg clot.  Acute hypoxic respiratory insufficiency -Supplemental oxygen.  Titrate to keep saturation greater than 88% -Optimize pain control.  MRSA pneumonia from outside hospital (basis of this diagnosis unclear) - Continue linezolid and Unasyn -With definitive drainage should be able to establish final duration of therapy.  COPD without exacerbation. - Continue baseline COPD therapy  Type  2 DM with poor control - Continue basal bolus insulin  Morbid obesity -May require BiPAP at nighttime  Chronic pain and anxiety  -Multimodal pain control -Resume home antidepressants.  Daily Goals Checklist  Pain/Anxiety/Delirium protocol (if indicated): IV Tylenol, lidocaine patch, IV Dilaudid, oral oxycodone.  Encourage oral narcotic use. VAP protocol (if indicated): Not intubated Respiratory support goals: Titrate O2 to keep sat greater than 88%, incentive spirometry. Blood pressure target: Keep systolic blood pressure less than 160 DVT prophylaxis: We will resume IV anticoagulation 12-hour stop. Nutritional status and feeding goals: clear liquids overnight.  Progress diet pending on clinical status in the morning GI prophylaxis: Not indicated Fluid status goals: Allow autoregulation Urinary catheter: Assessment of intravascular volume Central lines: Right IJ double-lumen Glucose control: Phase 1 glycemic Mobility/therapy needs: Progressive ambulation Antibiotic de-escalation: Continue linezolid and Unasyn empirically.  establish final duration of antibiotic therapy Home medication reconciliation: Currently on hold postop Daily labs: CBC, BMP Code Status: Full code Family Communication: Updated her aunt 11/28 informed her that surgery was successful but she was being moved to the ICU for monitoring of postoperative bleeding and pain control. Disposition: ICU  Critical care time: 40 min    Lynnell Catalan, MD [facility] ICU Physician [facility] [place] [facility]  Pager: [phone] Or Epic Secure Chat After hours: [phone].  05/12/2020, 3:44 PM

## 2020-05-12 NOTE — H&P (Signed)
History and Physical Interval Note:  05/12/2020 1:48 PM  Annette [name]  has presented today for surgery, with the diagnosis of Pneumothorax.  The various methods of treatment have been discussed with the patient and family. After consideration of risks, benefits and other options for treatment, the patient has consented to  Procedure(s): VIDEO ASSISTED THORACOSCOPY (VATS)/THOROCOTOMY MECCHANICAL TALE PLEURO DESIS (Right) as a surgical intervention.  The patient's history has been reviewed, patient examined, no change in status, stable for surgery.  I have reviewed the patient's chart and labs.  Questions were answered to the patient's satisfaction.     [name]

## 2020-05-12 NOTE — Op Note (Signed)
Procedure(s): VIDEO ASSISTED THORACOSCOPY (VATS)/THOROCOTOMY MECCHANICAL PLEURO DESIS EVACUATION HEMATOMA Procedure Note  Annette [name] female 59 y.o. 05/12/2020  Procedure(s) and Anesthesia Type:    * VIDEO ASSISTED THORACOSCOPY (VATS)/THOROCOTOMY MECCHANICAL PLEURO DESIS - General    * EVACUATION HEMATOMA - General  Surgeon(s) and Role:    * [name], MD - Primary   Indications: The patient was admitted to the hospital with a history of Covid and right pneumothorax.  This was treated with percutaneous chest tube placements but she experienced a bleeding complication 2 nights ago.  She was taken the operating for evacuation of hematoma and thoracoscopic chest exploration     Surgeon: [name]   Assistants: Jaclyn Prime, PA-C  Anesthesia: General endotracheal - Double lumen tube  ASA Class: 4    Procedure Detail  VIDEO ASSISTED THORACOSCOPY (VATS)/THOROCOTOMY MECCHANICAL PLEURO DESIS, EVACUATION HEMATOMA  After informed consent, the patient was taken the operating room on the above listed date.  She is placed in the supine position on the operating table.  Anesthesia was begun in a smooth fashion by the general endotracheal technique.  After confirming adequate anesthesia she was placed in the right side up lateral decubitus position.  Existing chest tubes were removed.  The right chest was cleansed and draped in a sterile field using Betadine solution.  A preop surgical pause was performed.  The chest wall hematoma which had encompassed a prior chest tube insertion site was evacuated.  This was a large defect created by the blood clot.  Next 2 incisions were made in the midclavicular and anterior axillary line respectively.  One was used as a working port and the other was used for camera access.  The right chest was irrigated and mechanical pleurodesis was performed.  A moderate amount of clot and fluid was removed from the gutter of the right lung.  3 chest  tubes were then inserted in the right pleural space.  The wounds were then closed in layers.  The large defect created by the hematoma was treated with a wound VAC placement.  This concluded the procedure all sponge instruments and needle counts were correct.  Estimated Blood Loss:  less than 50 mL         Drains: As above          Blood Given: 250 CC PRBC          Specimens: None         Implants: none        Complications:  * No complications entered in OR log *         Disposition: PACU - hemodynamically stable.         Condition: stable

## 2020-05-12 NOTE — Anesthesia Procedure Notes (Signed)
Central Venous Catheter Insertion Performed by: Annye Asa, MD, anesthesiologist Start/End11/28/2021 1:12 PM, 05/12/2020 1:16 PM Patient location: Pre-op. Preanesthetic checklist: patient identified, IV checked, site marked, risks and benefits discussed, surgical consent, monitors and equipment checked, pre-op evaluation, timeout performed and anesthesia consent Position: supine Lidocaine 1% used for infiltration and patient sedated Hand hygiene performed , maximum sterile barriers used  and Seldinger technique used Catheter size: 8 Fr Central line was placed.Double lumen Procedure performed using ultrasound guided technique. Ultrasound Notes:anatomy identified, needle tip was noted to be adjacent to the nerve/plexus identified, no ultrasound evidence of intravascular and/or intraneural injection and image(s) printed for medical record Attempts: 1 Following insertion, line sutured, dressing applied and Biopatch. Post procedure assessment: blood return through all ports, free fluid flow and no air  Patient tolerated the procedure well with no immediate complications. Additional procedure comments: CVP: Timeout, sterile prep, drape, FBP R neck.  Supine position.  1% lido local, finder and trocar RIJ 1st pass with US guidance.  2 lumen placed over J wire. Biopatch and sterile dressing on.  Patient tolerated well.  VSS.  Jenita Seashore, MD.

## 2020-05-12 NOTE — Brief Op Note (Signed)
05/07/2020 - 05/12/2020  3:29 PM  PATIENT:  Annette [name]  59 y.o. female  PRE-OPERATIVE DIAGNOSIS:  Pneumothorax  POST-OPERATIVE DIAGNOSIS:  Pneumothorax  PROCEDURE:  Procedure(s): VIDEO ASSISTED THORACOSCOPY (VATS)/THOROCOTOMY MECCHANICAL PLEURO DESIS (Right) EVACUATION HEMATOMA (Right)  SURGEON:  Surgeon(s) and Role:    * [name], MD - Primary  PHYSICIAN ASSISTANT: Barrett, E  ASSISTANTS: staff   ANESTHESIA:   Get with lung isolations  EBL:  minimal   BLOOD ADMINISTERED:none  DRAINS: 3 Chest Tube(s) in the right pleural space   LOCAL MEDICATIONS USED:  OTHER exparel  SPECIMEN:  No Specimen  DISPOSITION OF SPECIMEN:  N/A  COUNTS:  YES  TOURNIQUET:  * No tourniquets in log *  DICTATION: .Note written in EPIC  PLAN OF CARE: Admit to inpatient   PATIENT DISPOSITION:  ICU - extubated and stable.   Delay start of Pharmacological VTE agent (>24hrs) due to surgical blood loss or risk of bleeding: yes

## 2020-05-12 NOTE — Plan of Care (Signed)
?  Problem: Coping: ?Goal: Level of anxiety will decrease ?Outcome: Progressing ?  ?Problem: Safety: ?Goal: Ability to remain free from injury will improve ?Outcome: Progressing ?  ?

## 2020-05-12 NOTE — Anesthesia Procedure Notes (Signed)
Arterial Line Insertion Start/End11/28/2021 1:10 PM, 05/12/2020 1:18 PM Performed by: Colon Flattery, CRNA, CRNA  Patient location: Pre-op. Preanesthetic checklist: patient identified, IV checked, site marked, risks and benefits discussed, surgical consent, monitors and equipment checked, pre-op evaluation, timeout performed and anesthesia consent Lidocaine 1% used for infiltration and patient sedated Left, radial was placed Catheter size: 20 G Hand hygiene performed  and maximum sterile barriers used   Attempts: 1 Procedure performed without using ultrasound guided technique. Following insertion, dressing applied and Biopatch. Post procedure assessment: normal  Patient tolerated the procedure well with no immediate complications.

## 2020-05-12 NOTE — Anesthesia Preprocedure Evaluation (Signed)
Anesthesia Evaluation  Patient identified by MRN, date of birth, ID band Patient awake    Reviewed: Allergy & Precautions, NPO status , Patient's Chart, lab work & pertinent test results  History of Anesthesia Complications Negative for: history of anesthetic complications  Airway Mallampati: I  TM Distance: >3 FB Neck ROM: Full    Dental  (+) Edentulous Upper, Missing, Dental Advisory Given, Poor Dentition   Pulmonary COPD,  COPD inhaler, former smoker, PE (New, nonocclusive pulmonary embolism involving the right lower) 05/11/2020 Persistent moderate-sized right-sided pneumothorax despite new chest tube placement. The mediastinum appears shifted to the left, which has worsened since the prior study. This raises suspicion for developing tension physiology. 3. New moderate-sized, right-sided hemothorax. 4. The right-sided surgically placed chest tube is kinked both proximally and distally. Chest tube also possibly courses through the right lower lobe causing a pulmonary laceration.  Covid pneumonia 2 months ago which was complicated by her embolisms.  She was admitted to [facility] for management of a right-sided MRSA pneumonia she subsequently developed a hydropneumothorax and required chest tube insertion   + rhonchi  (-) wheezing      Cardiovascular  Rhythm:Regular Rate:Normal     Neuro/Psych negative neurological ROS     GI/Hepatic GERD  Medicated,  Endo/Other  diabetes (glu 112), Oral Hypoglycemic AgentsMorbid obesity  Renal/GU      Musculoskeletal   Abdominal (+) + obese,   Peds  Hematology  (+) Blood dyscrasia (Hb 7.8), anemia , eliquis   Anesthesia Other Findings   Reproductive/Obstetrics                             Anesthesia Physical Anesthesia Plan  ASA: IV  Anesthesia Plan: General   Post-op Pain Management:    Induction: Intravenous  PONV Risk Score and  Plan: 3 and Ondansetron, Dexamethasone and Scopolamine patch - Pre-op  Airway Management Planned: Oral ETT and Double Lumen EBT  Additional Equipment: Arterial line, CVP and Ultrasound Guidance Line Placement  Intra-op Plan:   Post-operative Plan: Possible Post-op intubation/ventilation  Informed Consent: I have reviewed the patients History and Physical, chart, labs and discussed the procedure including the risks, benefits and alternatives for the proposed anesthesia with the patient or authorized representative who has indicated his/her understanding and acceptance.     Dental advisory given  Plan Discussed with: CRNA and Surgeon  Anesthesia Plan Comments:         Anesthesia Quick Evaluation

## 2020-05-12 NOTE — Anesthesia Postprocedure Evaluation (Signed)
Anesthesia Post Note  Patient: Annette [name]  Procedure(s) Performed: VIDEO ASSISTED THORACOSCOPY (VATS)/THOROCOTOMY MECCHANICAL PLEURO DESIS (Right Chest) EVACUATION HEMATOMA (Right Chest)     Patient location during evaluation: PACU Anesthesia Type: General Level of consciousness: awake and alert, sedated, patient cooperative and oriented Pain management: pain level controlled Vital Signs Assessment: post-procedure vital signs reviewed and stable Respiratory status: spontaneous breathing, nonlabored ventilation, respiratory function stable and patient connected to face mask oxygen Cardiovascular status: blood pressure returned to baseline and stable Postop Assessment: no apparent nausea or vomiting Anesthetic complications: no   No complications documented.  Last Vitals:  Vitals:   05/12/20 1100 05/12/20 1605  BP: (!) 110/58 131/75  Pulse: 84 95  Resp: (!) 22 17  Temp:  37 C  SpO2: 100% 100%    Last Pain:  Vitals:   05/12/20 1243  TempSrc:   PainSc: 6                  Cederic Mozley,E. Ovetta Bazzano

## 2020-05-13 ENCOUNTER — Encounter (HOSPITAL_COMMUNITY): Payer: Self-pay | Admitting: Cardiothoracic Surgery

## 2020-05-13 ENCOUNTER — Inpatient Hospital Stay (HOSPITAL_COMMUNITY): Payer: Medicare Other

## 2020-05-13 DIAGNOSIS — J9601 Acute respiratory failure with hypoxia: Secondary | ICD-10-CM

## 2020-05-13 DIAGNOSIS — J15212 Pneumonia due to Methicillin resistant Staphylococcus aureus: Secondary | ICD-10-CM

## 2020-05-13 DIAGNOSIS — J948 Other specified pleural conditions: Secondary | ICD-10-CM | POA: Diagnosis not present

## 2020-05-13 DIAGNOSIS — I2693 Single subsegmental pulmonary embolism without acute cor pulmonale: Secondary | ICD-10-CM | POA: Diagnosis not present

## 2020-05-13 DIAGNOSIS — J9311 Primary spontaneous pneumothorax: Secondary | ICD-10-CM | POA: Diagnosis not present

## 2020-05-13 LAB — BASIC METABOLIC PANEL
Anion gap: 8 (ref 5–15)
BUN: 13 mg/dL (ref 6–20)
CO2: 31 mmol/L (ref 22–32)
Calcium: 7.9 mg/dL — ABNORMAL LOW (ref 8.9–10.3)
Chloride: 99 mmol/L (ref 98–111)
Creatinine, Ser: 0.57 mg/dL (ref 0.44–1.00)
GFR, Estimated: >60 mL/min (ref >60)
Glucose, Bld: 112 mg/dL — ABNORMAL HIGH (ref 70–99)
Potassium: 4.6 mmol/L (ref 3.5–5.1)
Sodium: 138 mmol/L (ref 135–145)

## 2020-05-13 LAB — CULTURE, BLOOD (ROUTINE X 2)
Culture: NO GROWTH
Culture: NO GROWTH

## 2020-05-13 LAB — GLUCOSE, CAPILLARY
Glucose-Capillary: 114 mg/dL — ABNORMAL HIGH (ref 70–99)
Glucose-Capillary: 125 mg/dL — ABNORMAL HIGH (ref 70–99)
Glucose-Capillary: 213 mg/dL — ABNORMAL HIGH (ref 70–99)
Glucose-Capillary: 213 mg/dL — ABNORMAL HIGH (ref 70–99)
Glucose-Capillary: 227 mg/dL — ABNORMAL HIGH (ref 70–99)
Glucose-Capillary: 97 mg/dL (ref 70–99)

## 2020-05-13 LAB — CBC
HCT: 28.1 % — ABNORMAL LOW (ref 36.0–46.0)
Hemoglobin: 8.6 g/dL — ABNORMAL LOW (ref 12.0–15.0)
MCH: 28.3 pg (ref 26.0–34.0)
MCHC: 30.6 g/dL (ref 30.0–36.0)
MCV: 92.4 fL (ref 80.0–100.0)
Platelets: 114 10*3/uL — ABNORMAL LOW (ref 150–400)
RBC: 3.04 MIL/uL — ABNORMAL LOW (ref 3.87–5.11)
RDW: 16.7 % — ABNORMAL HIGH (ref 11.5–15.5)
WBC: 8.7 10*3/uL (ref 4.0–10.5)
nRBC: 0 % (ref 0.0–0.2)

## 2020-05-13 MED ORDER — DIPHENHYDRAMINE HCL 12.5 MG/5ML PO ELIX
12.5000 mg | ORAL_SOLUTION | Freq: Four times a day (QID) | ORAL | Status: DC | PRN
  Filled 2020-05-13: qty 5

## 2020-05-13 MED ORDER — DIPHENHYDRAMINE HCL 50 MG/ML IJ SOLN: 12.5000 mg | Freq: Four times a day (QID) | INTRAMUSCULAR | Status: DC | PRN

## 2020-05-13 MED ORDER — INSULIN GLARGINE 100 UNIT/ML ~~LOC~~ SOLN
20.0000 [IU] | Freq: Every day | SUBCUTANEOUS | Status: DC
  Administered 2020-05-13 – 2020-05-16 (×4): 20 [IU] via SUBCUTANEOUS
  Filled 2020-05-13 (×6): qty 0.2

## 2020-05-13 MED ORDER — SODIUM CHLORIDE 0.9% FLUSH: 9.0000 mL | INTRAVENOUS | Status: DC | PRN

## 2020-05-13 MED ORDER — INSULIN ASPART 100 UNIT/ML ~~LOC~~ SOLN
5.0000 [IU] | Freq: Three times a day (TID) | SUBCUTANEOUS | Status: DC
  Administered 2020-05-14 – 2020-05-16 (×9): 5 [IU] via SUBCUTANEOUS

## 2020-05-13 MED ORDER — MORPHINE SULFATE (PF) 2 MG/ML IV SOLN
2.0000 mg | INTRAVENOUS | Status: DC | PRN
  Administered 2020-05-13 (×2): 2 mg via INTRAVENOUS
  Filled 2020-05-13 (×2): qty 1

## 2020-05-13 MED ORDER — ONDANSETRON HCL 4 MG/2ML IJ SOLN: 4.0000 mg | Freq: Four times a day (QID) | INTRAMUSCULAR | Status: DC | PRN

## 2020-05-13 MED ORDER — NALOXONE HCL 0.4 MG/ML IJ SOLN: 0.4000 mg | INTRAMUSCULAR | Status: DC | PRN

## 2020-05-13 MED ORDER — MORPHINE SULFATE 2 MG/ML IV SOLN
INTRAVENOUS | Status: DC
  Administered 2020-05-13: 6 mg via INTRAVENOUS
  Administered 2020-05-14: 29.14 mg via INTRAVENOUS
  Administered 2020-05-14: 4.5 mg via INTRAVENOUS
  Administered 2020-05-14: 2 mL via INTRAVENOUS
  Administered 2020-05-14: 7.5 mg via INTRAVENOUS
  Administered 2020-05-15: 12 mg via INTRAVENOUS
  Administered 2020-05-15: 18 mg via INTRAVENOUS
  Administered 2020-05-15 (×2): 12 mg via INTRAVENOUS
  Administered 2020-05-15: 15 mg via INTRAVENOUS
  Administered 2020-05-15 – 2020-05-16 (×2): 13.5 mg via INTRAVENOUS
  Administered 2020-05-16: 18 mg via INTRAVENOUS
  Administered 2020-05-16: 7.5 mg via INTRAVENOUS
  Administered 2020-05-16: 13.5 mg via INTRAVENOUS
  Administered 2020-05-16: 1.5 mg via INTRAVENOUS
  Administered 2020-05-17: 0 mg via INTRAVENOUS
  Administered 2020-05-17: 4.5 mg via INTRAVENOUS
  Administered 2020-05-17 – 2020-05-18 (×2): 18 mg via INTRAVENOUS
  Administered 2020-05-18: 13.5 mg via INTRAVENOUS
  Administered 2020-05-18: 19.5 mg via INTRAVENOUS
  Administered 2020-05-18: 12 mg via INTRAVENOUS
  Administered 2020-05-18: 9 mg via INTRAVENOUS
  Administered 2020-05-18: 0 mg via INTRAVENOUS
  Administered 2020-05-18: 15 mg via INTRAVENOUS
  Filled 2020-05-13: qty 50
  Filled 2020-05-13 (×2): qty 25
  Filled 2020-05-13 (×3): qty 50
  Filled 2020-05-13: qty 25
  Filled 2020-05-13 (×3): qty 50

## 2020-05-13 MED ORDER — ALBUTEROL SULFATE (2.5 MG/3ML) 0.083% IN NEBU
2.5000 mg | INHALATION_SOLUTION | Freq: Three times a day (TID) | RESPIRATORY_TRACT | Status: DC
  Administered 2020-05-13 – 2020-05-14 (×4): 2.5 mg via RESPIRATORY_TRACT
  Filled 2020-05-13 (×4): qty 3

## 2020-05-13 NOTE — Progress Notes (Signed)
NAME:  Annette [name], MRN:  [id number], DOB:  [birth year], LOS: 6 ADMISSION DATE:  05/07/2020, CONSULTATION DATE:  11/228/2021 REFERRING MD:  Vickey Sages - TCTS, CHIEF COMPLAINT:  Right chest hematoma.   HPI/course in hospital  59 year old woman who is admitted to the CTICU following VATS evacuation of chest hematoma.   Recent COVID pneumonia.  She was transferred to [facility] on 11/23 from [facility] where she was admitted with a right upper lobe pneumonia due to MRSA. Also concurrently found to have a right lower lobe subsegmental PE. She was started on linezolid and heparin, but developed right hydropneumothorax 2 days later.   11/24 - IR directed chest tube placement with near complete resolution of the pneumothorax.  11/26 - began to bleed around chest tube with 3g/L HB drop. Transfused.  11/27 - stabilized following transfusion. 11/28 - OR for definitive management underwent video-assisted evacuation of hematoma and mechanical pleurodesis under general anesthesia with lung isolation.  Past Medical History   Past Medical History:  Diagnosis Date  . Anxiety   . Depression   . Diabetes mellitus without complication (HCC)    Type II  . Hypertension   . Pneumonia       Interim history/subjective:  She has chest pain related to her chest tube but denies complaints otherwise.  Objective   Blood pressure 122/70, pulse (!) 103, temperature 98.3 F (36.8 C), temperature source Oral, resp. rate 18, height 5\' 8"  (1.727 m), weight 111.7 kg, SpO2 95 %.        Intake/Output Summary (Last 24 hours) at 05/13/2020 1719 Last data filed at 05/13/2020 1600 Gross per 24 hour  Intake 2588.4 ml  Output 1470 ml  Net 1118.4 ml   Filed Weights   05/09/20 0526 05/12/20 0420 05/13/20 0600  Weight: 113.7 kg 111.8 kg 111.7 kg     General: Elderly woman lying in bed no acute distress HEENT: /AT, eyes anicteric, oral mucosa moist Cardiac: Regular rate and rhythm Respiratory: Mild respiratory  splinting, mild tachypnea, no accessory muscle use.  Clear to auscultation bilaterally.  Chest tubes with thin blood-tinged output. Abdomen: Soft, nontender, nondistended Extremities: no clubbing or cyanosis Derm: Pallor, no rashes  Ancillary tests (personally reviewed)  CBC: Recent Labs  Lab 05/10/20 0245 05/10/20 1730 05/11/20 0747 05/11/20 0747 05/11/20 1135 05/11/20 1135 05/11/20 2218 05/12/20 0242 05/12/20 1336 05/12/20 1524 05/13/20 0434  WBC 15.8*  --  10.3  --  9.3  --   --  7.4  --   --  8.7  NEUTROABS 13.8*  --  8.4*  --   --   --   --   --   --   --   --   HGB 11.0*   < > 8.5*   < > 7.9*   < > 8.0* 7.8* 7.8* 8.8* 8.6*  HCT 35.8*   < > 27.3*   < > 25.5*   < > 25.3* 25.0* 23.0* 26.0* 28.1*  MCV 93.0  --  90.4  --  88.5  --   --  90.3  --   --  92.4  PLT 126*  --  127*  --  116*  --   --  113*  --   --  114*   < > = values in this interval not displayed.    Basic Metabolic Panel: Recent Labs  Lab 05/09/20 0011 05/09/20 0011 05/10/20 0245 05/10/20 0245 05/11/20 1028 05/12/20 0242 05/12/20 1336 05/12/20 1524 05/13/20 0434  NA  134*   < > 137   < > 137 139 140 138 138  K 4.5   < > 4.7   < > 4.3 4.8 4.6 4.9 4.6  CL 96*  --  100  --  98 99  --   --  99  CO2 30  --  33*  --  31 33*  --   --  31  GLUCOSE 349*  --  255*  --  232* 169*  --   --  112*  BUN 23*  --  19  --  26* 21*  --   --  13  CREATININE 0.69  --  0.70  --  0.76 0.87  --   --  0.57  CALCIUM 7.4*  --  8.0*  --  7.7* 8.0*  --   --  7.9*  MG 1.7  --  1.8  --   --  1.8  --   --   --   PHOS 3.3  --  2.6  --   --  3.4  --   --   --    < > = values in this interval not displayed.   GFR: Estimated Creatinine Clearance: 98 mL/min (by C-G formula based on SCr of 0.57 mg/dL). Recent Labs  Lab 05/08/20 0016 05/09/20 0011 05/10/20 0245 05/10/20 0245 05/11/20 0747 05/11/20 1135 05/12/20 0242 05/13/20 0434  PROCALCITON <0.10  --  0.18  --  0.26  --   --   --   WBC 12.2*   < > 15.8*   < > 10.3 9.3 7.4  8.7  LATICACIDVEN 1.6  --   --   --   --   --   --   --    < > = values in this interval not displayed.    Liver Function Tests: Recent Labs  Lab 05/08/20 0016 05/09/20 0011 05/10/20 0245 05/11/20 1028 05/12/20 0242  AST 25  --   --  12*  --   ALT 34  --   --  13  --   ALKPHOS 51  --   --  41  --   BILITOT 0.8  --   --  0.5  --   PROT 4.2*  --   --  3.5*  --   ALBUMIN 1.8* 1.5* 1.4* 1.4* 1.4*   No results for input(s): LIPASE, AMYLASE in the last 168 hours. No results for input(s): AMMONIA in the last 168 hours.  ABG    Component Value Date/Time   PHART 7.400 05/12/2020 1524   PCO2ART 58.4 (H) 05/12/2020 1524   PO2ART 166 (H) 05/12/2020 1524   HCO3 36.1 (H) 05/12/2020 1524   TCO2 38 (H) 05/12/2020 1524   O2SAT 99.0 05/12/2020 1524     Coagulation Profile: Recent Labs  Lab 05/11/20 1135  INR 1.0    Cardiac Enzymes: No results for input(s): CKTOTAL, CKMB, CKMBINDEX, TROPONINI in the last 168 hours.  HbA1C: Hgb A1c MFr Bld  Date/Time Value Ref Range Status  05/08/2020 12:16 AM 10.7 (H) 4.8 - 5.6 % Final    Comment:    (NOTE) Pre diabetes:          5.7%-6.4%  Diabetes:              >6.4%  Glycemic control for   <7.0% adults with diabetes     CBG: Recent Labs  Lab 05/12/20 2339 05/13/20 0401 05/13/20 0820 05/13/20 1117 05/13/20 1528  GLUCAP 108* 97  114* 125* 213*     Assessment & Plan:   Right chest hematoma and hydropneumothorax requiring VATS and mechanical pleurodesis.  Morbid obesity. -Optimize pain control with multimodal analgesia -Remained stable overnight post extubation -Ongoing chest tube management per CTS.  RLL subsegmental PE  - Resume IV heparin 12h post operatively if chest tube output acceptable. Stable to resume this evening with minimal increase in output. - Bilateral venous Dopplers to assess residual leg clot> pending  Acute hypoxic respiratory insufficiency -Supplemental oxygen as required to maintain SPO2 greater than  88%. -Pulmonary hygiene  MRSA pneumonia from outside hospital (basis of this diagnosis unclear) - Continue linezolid and Unasyn  -With definitive drainage should be able to establish final duration of therapy can discontinue linezolid tomorrow.  COPD without exacerbation -Does not appear to be on long-acting inhalers at home.  Continue albuterol 3 times daily.  If her symptoms are uncontrolled, would escalate to DuoNeb every 4 hours.  Patient may benefit from Spiriva at discharge.  Type 2 DM with poor control - Continue basal bolus insulin  Morbid obesity -May require NIPPV at nighttime  Chronic pain and anxiety  -Multimodal pain control -Resume home antidepressants.  Daily Goals Checklist  Pain/Anxiety/Delirium protocol (if indicated): IV Tylenol, lidocaine patch, IV Dilaudid, oral oxycodone.  Encourage oral narcotic use. VAP protocol (if indicated): Not intubated Respiratory support goals: Titrate O2 to keep sat greater than 88%, incentive spirometry. Blood pressure target: Keep systolic blood pressure less than 160 DVT prophylaxis: We will resume IV anticoagulation 12-hour stop. Nutritional status and feeding goals: clear liquids overnight.  Progress diet pending on clinical status in the morning GI prophylaxis: Not indicated Fluid status goals: Allow autoregulation Urinary catheter: Assessment of intravascular volume Central lines: Right IJ double-lumen Glucose control: Phase 1 glycemic Mobility/therapy needs: Progressive ambulation Antibiotic de-escalation: Continue linezolid and Unasyn empirically.  establish final duration of antibiotic therapy Home medication reconciliation: Currently on hold postop Daily labs: CBC, BMP Code Status: Full code Family Communication: Updated patient at bedside Disposition: ICU; likely stable for stepdown tomorrow  Steffanie Dunn, DO 05/13/20 8:28 PM [facility]

## 2020-05-13 NOTE — Progress Notes (Signed)
      [address]       [place] [address]             [phone]      POD # 1 VATS for hemothorax, pleurodesis  No complaints BP 122/70 (BP Location: Right Arm)   Pulse (!) 103   Temp 98.3 F (36.8 C) (Oral)   Resp (!) 21   Ht 5\' 8"  (1.727 m)   Wt 111.7 kg   SpO2 97%   BMI 37.44 kg/m    Stable POD # 1  Dyon Rotert C. , MD [facility] [phone]

## 2020-05-13 NOTE — Progress Notes (Addendum)
PROGRESS NOTE  Annette [name] WNU:[id number] DOB: [birth year]   PCP: Patient, No Pcp Per  Patient is from: Home  DOA: 05/07/2020 LOS: 6  Brief Narrative / Interim history: 59 yr F with recent COVID-19 about 2 months ago, recent PE on Eliquis, COPD/Asthma not on oxygen, DM-2, HTN and HLD who was admitted to [facility] on 05/03/2020 where she was admitted for RUL MRSA pneumonia and RLL subsegmental PE (TTE with mod PAH but no right heart strain) treated with vancomycin, then Zyvox,  and Lovenox.  She developed right-sided hydropneumothorax on 11/22 underwent chest tube insertion on 05/06/2020 and transferred to [facility] for CTS evaluation and treatment.   On arrival here, CT chest without contrast with moderately large right pneumothorax, subcutaneous emphysema throughout the right chest wall extending into right neck, pneumomediastinum, right upper lobe opacity and emphysema.  IR consulted by CTS and placed additional chest tube to right chest on 05/08/2020.    On 11/26, chest x-ray with near resolution of pneumothorax but persistent right lung opacity.  IV Unasyn added. Also had significant bleeding around chest tube.  Hemoglobin dropped 3 g.  Heparin held.  Transfused 2 units and stabilized.  CTA chest with new nonocclusive PE in RLL, persistent moderate right-sided pneumothorax with mediastinal shift to the left, new moderate sized right-sided pneumothorax, changed chest tube possibly causing pulmonary laceration and new large right flank hematoma measuring 12 cm without definite evidence of active extravasation, diffuse wall thickening of gallbladder, coarse airspace opacities and pulmonary fibrosis.   On 11/28, patient underwent VATS/thoracotomy mechanical pleurodesis and wound VAC placement, and sent to ICU out of concern about her respiratory status but remained stable 4 L oxygen by nasal cannula.  On 11/29, transferred out of ICU to progressive unit.  Subjective: Seen  and examined earlier this morning.  No major events overnight or this morning.  Endorses significant pain around her right chest.  She rates her pain 8/10.  Pain improved with IV morphine, not much with IV fentanyl.  Denies shortness of breath, GI or UTI symptoms.  Objective: Vitals:   05/13/20 0600 05/13/20 0700 05/13/20 0736 05/13/20 0819  BP: 128/68     Pulse: 95 92    Resp: (!) 22 (!) 21    Temp:    98 F (36.7 C)  TempSrc:    Oral  SpO2: 96% 96% 100%   Weight: 111.7 kg     Height:        Intake/Output Summary (Last 24 hours) at 05/13/2020 1012 Last data filed at 05/13/2020 0700 Gross per 24 hour  Intake 3533.51 ml  Output 2730 ml  Net 803.51 ml   Filed Weights   05/09/20 0526 05/12/20 0420 05/13/20 0600  Weight: 113.7 kg 111.8 kg 111.7 kg    Examination:  GENERAL: No apparent distress.  Nontoxic. HEENT: MMM.  Vision and hearing grossly intact.  NECK: Supple.  No apparent JVD.  RESP: 97% on 4 L.  No IWOB.  Rhonchi bilaterally.  Chest tube to right chest with serosanguineous output CVS:  RRR. Heart sounds normal.  ABD/GI/GU: BS+. Abd soft, NTND.  MSK/EXT:  Moves extremities. No apparent deformity. No edema.  SKIN: Ecchymosis lateral to her right breast.  Wound VAC in place.  Small serosanguineous fluid in canister. NEURO: Awake, alert and oriented appropriately.  No apparent focal neuro deficit. PSYCH: Calm. Normal affect.  Procedures:  05/06/2020-right chest tube placement at outside hospital 05/08/2020-second right chest tube placement by IR  05/12/2020-VATS/thoracotomy mechanical pleurodesis and  wound VAC placement   Microbiology summarized: Blood cultures NGTD. MRSA PCR positive.  Assessment & Plan: Right-sided pneumothorax with mediastinal shift: s/p VATS/thoracotomy mechanical pleurodesis Large right flank hematoma measuring about 12 cm-s/p evacuation and wound VAC Acute blood loss anemia: Hgb 11>> 7.7>2u> 8.5>7.8>1u> 8.6.  Received 3 units so  far. -Chest x-ray from this morning negative for pneumothorax but persistent bilateral lung opacities -Monitor H&H -Transfuse for Hgb less than 7.0  Right upper lobe MRSA pneumonia: No fever but leukocytosis.  Chest x-ray with persistent bilateral lung opacities.  MRSA PCR positive. -Vancomycin 11/19-11/21>> linezolid 11/21>> -IV Unasyn 11/26>>>  Acute RLL subsegmental PE: TTE at OSH with moderate PAH but no right heart strain. -Resume heparin if okay by CTS.  Acute hypoxemic respiratory failure: Likely due to the above.  Stable on 3 to 4 L by nasal cannula. -Wean oxygen as able -Encourage incentive spirometry -Treat treatable causes as above  Uncontrolled noninsulin-dependent type II diabetes with hyperglycemia: A1c 10.7%. Recent Labs  Lab 05/12/20 1844 05/12/20 1959 05/12/20 2339 05/13/20 0401 05/13/20 0820  GLUCAP 132* 168* 108* 97 114*  -Continue SSI-moderate -Continue NovoLog 8 units AC -Continue Lantus 25 units twice daily -Continue home statin.  Chronic COPD: Stable -DuoNebs as needed  Hyperlipidemia -Continue Lipitor and fenofibrate  GERD -Continue PPI  Anxiety: Stable -Continue home Klonopin  Neuropathy/chronic pain -Continue home Lyrica  History of COVID-19 infection: Treated for this about 2 months ago.  Leukocytosis/bandemia: Likely due to #1.  Resolved. -Continue monitoring  Thrombocytopenia: Platelet 126>> 113> 144 -Continue monitoring  Intertrigo/cutaneous candidiasis -Diflucan 150 mg every 72 hours x2 -Topical nystatin powder  Morbid obesity: Body mass index is 37.44 kg/m. Nutrition Problem: Increased nutrient needs Etiology: wound healing, acute illness (recent COVID PNA) Signs/Symptoms: estimated needs Interventions: MVI, Ensure Enlive (each supplement provides 350kcal and 20 grams of protein)   Stage II pressure skin injury: POA Pressure Injury 05/07/20 Coccyx Medial;Mid Stage 2 -  Partial thickness loss of dermis presenting  as a shallow open injury with a red, pink wound bed without slough. (Active)  05/07/20 2100  Location: Coccyx  Location Orientation: Medial;Mid  Staging: Stage 2 -  Partial thickness loss of dermis presenting as a shallow open injury with a red, pink wound bed without slough.  Wound Description (Comments):   Present on Admission: Yes   DVT prophylaxis:  Off IV heparin due to acute blood loss  Code Status: Full code Family Communication: Patient and/or RN. Available if any question.  Status is: Inpatient.  After discussion with intensivist, Dr. Chestine Spore, patient to remain in ICU to ensure no bleeding after resuming heparin likely on 11/30  Remains inpatient appropriate because:Ongoing active pain requiring inpatient pain management, Unsafe d/c plan, IV treatments appropriate due to intensity of illness or inability to take PO and Inpatient level of care appropriate due to severity of illness   Dispo: The patient is from: Home              Anticipated d/c is to: To be determined after therapy evaluation              Anticipated d/c date is: > 3 days              Patient currently is not medically stable to d/c.       Consultants:  Cardiothoracic surgery Interventional radiology PCCM   Sch Meds:  Scheduled Meds: . acetaminophen  1,000 mg Oral Q6H   Or  . acetaminophen (TYLENOL) oral liquid 160 mg/5  mL  1,000 mg Oral Q6H  . albuterol  2.5 mg Nebulization TID  . atorvastatin  40 mg Oral Daily  . bisacodyl  10 mg Oral Daily  . Chlorhexidine Gluconate Cloth  6 each Topical Daily  . cholecalciferol  1,000 Units Oral Daily  . clonazePAM  0.5 mg Oral BID  . fenofibrate  160 mg Oral Daily  . fluconazole  150 mg Oral Q72H  . insulin aspart  0-24 Units Subcutaneous Q4H  . lidocaine  1 patch Transdermal Q24H  . linezolid  600 mg Oral Q12H  . multivitamin with minerals  1 tablet Oral Daily  . mupirocin ointment  1 application Nasal BID  . nystatin   Topical BID  . pregabalin  150  mg Oral TID  . Ensure Max Protein  11 oz Oral BID  . senna-docusate  1 tablet Oral QHS   Continuous Infusions: . sodium chloride 100 mL/hr at 05/13/20 0700  . sodium chloride    . ampicillin-sulbactam (UNASYN) IV 3 g (05/13/20 0844)   PRN Meds:.Place/Maintain arterial line **AND** sodium chloride, fentaNYL (SUBLIMAZE) injection, influenza vac split quadrivalent PF, ondansetron (ZOFRAN) IV, oxyCODONE, pneumococcal 23 valent vaccine, traMADol  Antimicrobials: Anti-infectives (From admission, onward)   Start     Dose/Rate Route Frequency Ordered Stop   05/10/20 1300  fluconazole (DIFLUCAN) tablet 150 mg        150 mg Oral every 72 hours 05/10/20 1151 05/16/20 1259   05/10/20 0900  Ampicillin-Sulbactam (UNASYN) 3 g in sodium chloride 0.9 % 100 mL IVPB        3 g 200 mL/hr over 30 Minutes Intravenous Every 6 hours 05/10/20 0753 05/17/20 0859   05/08/20 0030  linezolid (ZYVOX) tablet 600 mg        600 mg Oral Every 12 hours 05/08/20 0015         I have personally reviewed the following labs and images: CBC: Recent Labs  Lab 05/10/20 0245 05/10/20 1730 05/11/20 0747 05/11/20 0747 05/11/20 1135 05/11/20 1135 05/11/20 2218 05/12/20 0242 05/12/20 1336 05/12/20 1524 05/13/20 0434  WBC 15.8*  --  10.3  --  9.3  --   --  7.4  --   --  8.7  NEUTROABS 13.8*  --  8.4*  --   --   --   --   --   --   --   --   HGB 11.0*   < > 8.5*   < > 7.9*   < > 8.0* 7.8* 7.8* 8.8* 8.6*  HCT 35.8*   < > 27.3*   < > 25.5*   < > 25.3* 25.0* 23.0* 26.0* 28.1*  MCV 93.0  --  90.4  --  88.5  --   --  90.3  --   --  92.4  PLT 126*  --  127*  --  116*  --   --  113*  --   --  114*   < > = values in this interval not displayed.   BMP &GFR Recent Labs  Lab 05/09/20 0011 05/09/20 0011 05/10/20 0245 05/10/20 0245 05/11/20 1028 05/12/20 0242 05/12/20 1336 05/12/20 1524 05/13/20 0434  NA 134*   < > 137   < > 137 139 140 138 138  K 4.5   < > 4.7   < > 4.3 4.8 4.6 4.9 4.6  CL 96*  --  100  --  98 99   --   --  99  CO2 30  --  33*  --  31 33*  --   --  31  GLUCOSE 349*  --  255*  --  232* 169*  --   --  112*  BUN 23*  --  19  --  26* 21*  --   --  13  CREATININE 0.69  --  0.70  --  0.76 0.87  --   --  0.57  CALCIUM 7.4*  --  8.0*  --  7.7* 8.0*  --   --  7.9*  MG 1.7  --  1.8  --   --  1.8  --   --   --   PHOS 3.3  --  2.6  --   --  3.4  --   --   --    < > = values in this interval not displayed.   Estimated Creatinine Clearance: 98 mL/min (by C-G formula based on SCr of 0.57 mg/dL). Liver & Pancreas: Recent Labs  Lab 05/08/20 0016 05/09/20 0011 05/10/20 0245 05/11/20 1028 05/12/20 0242  AST 25  --   --  12*  --   ALT 34  --   --  13  --   ALKPHOS 51  --   --  41  --   BILITOT 0.8  --   --  0.5  --   PROT 4.2*  --   --  3.5*  --   ALBUMIN 1.8* 1.5* 1.4* 1.4* 1.4*   No results for input(s): LIPASE, AMYLASE in the last 168 hours. No results for input(s): AMMONIA in the last 168 hours. Diabetic: No results for input(s): HGBA1C in the last 72 hours. Recent Labs  Lab 05/12/20 1844 05/12/20 1959 05/12/20 2339 05/13/20 0401 05/13/20 0820  GLUCAP 132* 168* 108* 97 114*   Cardiac Enzymes: No results for input(s): CKTOTAL, CKMB, CKMBINDEX, TROPONINI in the last 168 hours. No results for input(s): PROBNP in the last 8760 hours. Coagulation Profile: Recent Labs  Lab 05/11/20 1135  INR 1.0   Thyroid Function Tests: No results for input(s): TSH, T4TOTAL, FREET4, T3FREE, THYROIDAB in the last 72 hours. Lipid Profile: No results for input(s): CHOL, HDL, LDLCALC, TRIG, CHOLHDL, LDLDIRECT in the last 72 hours. Anemia Panel: No results for input(s): VITAMINB12, FOLATE, FERRITIN, TIBC, IRON, RETICCTPCT in the last 72 hours. Urine analysis: No results found for: COLORURINE, APPEARANCEUR, LABSPEC, PHURINE, GLUCOSEU, HGBUR, BILIRUBINUR, KETONESUR, PROTEINUR, UROBILINOGEN, NITRITE, LEUKOCYTESUR Sepsis Labs: Invalid input(s): PROCALCITONIN, LACTICIDVEN  Microbiology: Recent  Results (from the past 240 hour(s))  Culture, blood (routine x 2)     Status: None (Preliminary result)   Collection Time: 05/08/20 12:16 AM   Specimen: BLOOD  Result Value Ref Range Status   Specimen Description BLOOD LEFT HAND  Final   Special Requests   Final    BOTTLES DRAWN AEROBIC AND ANAEROBIC Blood Culture results may not be optimal due to an excessive volume of blood received in culture bottles   Culture   Final    NO GROWTH 4 DAYS Performed at [facility], [address] [place], [place] [address]    Report Status PENDING  Incomplete  Culture, blood (routine x 2)     Status: None (Preliminary result)   Collection Time: 05/08/20 12:16 AM   Specimen: BLOOD  Result Value Ref Range Status   Specimen Description BLOOD RIGHT ARM  Final   Special Requests   Final    BOTTLES DRAWN AEROBIC AND ANAEROBIC Blood Culture results may not be optimal  due to an excessive volume of blood received in culture bottles   Culture   Final    NO GROWTH 4 DAYS Performed at [facility], [address] [place], [place] [address]    Report Status PENDING  Incomplete  Surgical pcr screen     Status: Abnormal   Collection Time: 05/11/20 11:55 AM   Specimen: Nasal Mucosa; Nasal Swab  Result Value Ref Range Status   MRSA, PCR POSITIVE (A) NEGATIVE Final    Comment: RESULT CALLED TO, READ BACK BY AND VERIFIED WITH: RN A TEPE [phone]    Staphylococcus aureus POSITIVE (A) NEGATIVE Final    Comment: (NOTE) The Xpert SA Assay (FDA approved for NASAL specimens in patients 60 years of age and older), is one component of a comprehensive surveillance program. It is not intended to diagnose infection nor to guide or monitor treatment. Performed at [facility], [address] [place], [place] [address]     Radiology Studies: DG CHEST PORT 1 VIEW  Result Date: 05/13/2020 CLINICAL DATA:  Status post video assisted thoracic surgery. EXAM: PORTABLE CHEST 1 VIEW COMPARISON:   May 12, 2020. FINDINGS: Stable cardiomediastinal silhouette. Stable position of 3 right-sided chest tubes. No pneumothorax is noted. Stable right lung opacity is noted concerning for pneumonia. Stable patchy opacities are noted in the left lung consistent with pneumonia. No pneumothorax is noted. Small right pleural effusion may be present. Bony thorax is unremarkable. IMPRESSION: Stable position of 3 right-sided chest tubes. No pneumothorax is noted. Stable bilateral lung opacities are noted consistent with pneumonia. Electronically Signed   By: Lupita RaiderJames  Green Jr M.D.   On: 05/13/2020 08:21   DG CHEST PORT 1 VIEW  Result Date: 05/12/2020 CLINICAL DATA:  Recent pneumothorax EXAM: PORTABLE CHEST 1 VIEW COMPARISON:  May 11, 2020 FINDINGS: There are 2 chest tubes now directed superiorly on the right as well as a chest tube directed inferomedially on the right. Central catheter tip is at the cavoatrial junction. There is subcutaneous air on the right but no appreciable pneumothorax. There is extensive airspace consolidation throughout much of the right lung, particularly in the right upper lobe. There is no edema or airspace opacity on the left. Interstitial thickening on the left may in part be due to redistribution of blood flow to viable lung segments. Heart is borderline enlarged with pulmonary vascularity normal. No adenopathy. No bone lesions. IMPRESSION: Tube and catheter positions as described without pneumothorax. Subcutaneous air on the right noted. Extensive consolidation throughout much of the right lung consistent with multifocal pneumonia. Interstitial prominence on the left is likely due primarily to redistribution of blood flow to viable lung segments. Heart borderline enlarged. Electronically Signed   By: Bretta BangWilliam  Woodruff III M.D.   On: 05/12/2020 16:46     Shawneen Deetz T. Raffaela Ladley [facility]  If 7PM-7AM, please contact night-coverage [email or web address] 05/13/2020, 10:12 AM

## 2020-05-13 NOTE — Progress Notes (Addendum)
[address]       [place] [address]             [phone]      1 Day Post-Op Procedure(s) (LRB): VIDEO ASSISTED THORACOSCOPY (VATS)/THOROCOTOMY MECCHANICAL PLEURO DESIS (Right) EVACUATION HEMATOMA (Right) Subjective:  Extubated, awake and alert. Says pain control not as good with Fentanyl as it was with morphine.    Objective: Vital signs in last 24 hours: Temp:  [98 F (36.7 C)-99.3 F (37.4 C)] 98.2 F (36.8 C) (11/29 1116) Pulse Rate:  [86-98] 98 (11/29 1000) Cardiac Rhythm: Normal sinus rhythm (11/29 0800) Resp:  [17-30] 18 (11/29 1000) BP: (107-135)/(63-78) 107/72 (11/29 1000) SpO2:  [96 %-100 %] 98 % (11/29 1000) Arterial Line BP: (117-162)/(42-72) 137/53 (11/29 1000) Weight:  [111.7 kg] 111.7 kg (11/29 0600)     Intake/Output from previous day: 11/28 0701 - 11/29 0700 In: 3593.5 [P.O.:60; I.V.:2456.3; Blood:630; IV Piggyback:447.2] Out: 2980 [Urine:1940; Drains:320; Blood:200; Chest Tube:520] Intake/Output this shift: No intake/output data recorded.  General appearance: alert, cooperative and mild distress Neurologic: intact Heart: RRR Lungs: coarse upper airway congestion. Breath sounds are diminished on the right, clear on the left.  Wound: expected bruising right lateral chest. Minimal chest drainage from the wound vac past 12 hours.   Lab Results: Recent Labs    05/12/20 0242 05/12/20 1336 05/12/20 1524 05/13/20 0434  WBC 7.4  --   --  8.7  HGB 7.8*   < > 8.8* 8.6*  HCT 25.0*   < > 26.0* 28.1*  PLT 113*  --   --  114*   < > = values in this interval not displayed.   BMET:  Recent Labs    05/12/20 0242 05/12/20 1336 05/12/20 1524 05/13/20 0434  NA 139   < > 138 138  K 4.8   < > 4.9 4.6  CL 99  --   --  99  CO2 33*  --   --  31  GLUCOSE 169*  --   --  112*  BUN 21*  --   --  13  CREATININE 0.87  --   --  0.57  CALCIUM 8.0*  --   --  7.9*   < > = values in this interval not displayed.    PT/INR:  Recent  Labs    05/11/20 1135  LABPROT 13.1  INR 1.0   ABG    Component Value Date/Time   PHART 7.400 05/12/2020 1524   HCO3 36.1 (H) 05/12/2020 1524   TCO2 38 (H) 05/12/2020 1524   O2SAT 99.0 05/12/2020 1524   CBG (last 3)  Recent Labs    05/13/20 0401 05/13/20 0820 05/13/20 1117  GLUCAP 97 114* 125*   EXAM: PORTABLE CHEST 1 VIEW  COMPARISON:  May 12, 2020.  FINDINGS: Stable cardiomediastinal silhouette. Stable position of 3 right-sided chest tubes. No pneumothorax is noted. Stable right lung opacity is noted concerning for pneumonia. Stable patchy opacities are noted in the left lung consistent with pneumonia. No pneumothorax is noted. Small right pleural effusion may be present. Bony thorax is unremarkable.  IMPRESSION: Stable position of 3 right-sided chest tubes. No pneumothorax is noted. Stable bilateral lung opacities are noted consistent with pneumonia.   Electronically Signed   By: Lupita Raider M.D.   On: 05/13/2020 08:21   Assessment/Plan: S/P Procedure(s) (LRB): VIDEO ASSISTED THORACOSCOPY (VATS)/THOROCOTOMY MECCHANICAL PLEURO DESIS (Right) EVACUATION HEMATOMA (Right)  -POD-1 right VATS for evacuation of hematoma,  mechanical pleurodesis. Stable respiratory status. continue wound vac, ABX per primary team.  Need to mobilize and work on pulmonary hygiene. Plan for Vac change Tuesday or Wednesday.    LOS: 6 days    Leary Roca, [place] [phone] 05/13/2020 Pt seen and examined; agree with notation. Ok for low dose heparin this pm Mayumi Summerson Z. Vickey Sages, MD [phone]

## 2020-05-14 ENCOUNTER — Inpatient Hospital Stay (HOSPITAL_COMMUNITY): Payer: Medicare Other

## 2020-05-14 DIAGNOSIS — J9601 Acute respiratory failure with hypoxia: Secondary | ICD-10-CM | POA: Diagnosis not present

## 2020-05-14 DIAGNOSIS — J15212 Pneumonia due to Methicillin resistant Staphylococcus aureus: Secondary | ICD-10-CM | POA: Diagnosis not present

## 2020-05-14 DIAGNOSIS — J942 Hemothorax: Secondary | ICD-10-CM | POA: Diagnosis not present

## 2020-05-14 DIAGNOSIS — Z86718 Personal history of other venous thrombosis and embolism: Secondary | ICD-10-CM

## 2020-05-14 DIAGNOSIS — I2693 Single subsegmental pulmonary embolism without acute cor pulmonale: Secondary | ICD-10-CM | POA: Diagnosis not present

## 2020-05-14 DIAGNOSIS — J441 Chronic obstructive pulmonary disease with (acute) exacerbation: Secondary | ICD-10-CM

## 2020-05-14 DIAGNOSIS — I2699 Other pulmonary embolism without acute cor pulmonale: Secondary | ICD-10-CM

## 2020-05-14 DIAGNOSIS — J9311 Primary spontaneous pneumothorax: Secondary | ICD-10-CM | POA: Diagnosis not present

## 2020-05-14 LAB — COMPREHENSIVE METABOLIC PANEL
ALT: 14 U/L (ref 0–44)
AST: 11 U/L — ABNORMAL LOW (ref 15–41)
Albumin: 1.3 g/dL — ABNORMAL LOW (ref 3.5–5.0)
Alkaline Phosphatase: 42 U/L (ref 38–126)
Anion gap: 3 — ABNORMAL LOW (ref 5–15)
BUN: 12 mg/dL (ref 6–20)
CO2: 33 mmol/L — ABNORMAL HIGH (ref 22–32)
Calcium: 7.6 mg/dL — ABNORMAL LOW (ref 8.9–10.3)
Chloride: 96 mmol/L — ABNORMAL LOW (ref 98–111)
Creatinine, Ser: 0.58 mg/dL (ref 0.44–1.00)
GFR, Estimated: >60 mL/min (ref >60)
Glucose, Bld: 181 mg/dL — ABNORMAL HIGH (ref 70–99)
Potassium: 4.2 mmol/L (ref 3.5–5.1)
Sodium: 132 mmol/L — ABNORMAL LOW (ref 135–145)
Total Bilirubin: 0.4 mg/dL (ref 0.3–1.2)
Total Protein: 4.1 g/dL — ABNORMAL LOW (ref 6.5–8.1)

## 2020-05-14 LAB — BPAM RBC
Blood Product Expiration Date: 202112102359
Blood Product Expiration Date: 202112262359
Blood Product Expiration Date: 202112282359
Blood Product Expiration Date: 202112282359
Blood Product Expiration Date: 202112282359
Blood Product Expiration Date: 202112282359
ISSUE DATE / TIME: 202111270108
ISSUE DATE / TIME: 202111270343
ISSUE DATE / TIME: 202111281248
ISSUE DATE / TIME: 202111281248
Unit Type and Rh: 6200
Unit Type and Rh: 6200
Unit Type and Rh: 6200
Unit Type and Rh: 6200
Unit Type and Rh: 6200
Unit Type and Rh: 6200

## 2020-05-14 LAB — GLUCOSE, CAPILLARY
Glucose-Capillary: 166 mg/dL — ABNORMAL HIGH (ref 70–99)
Glucose-Capillary: 102 mg/dL — ABNORMAL HIGH (ref 70–99)
Glucose-Capillary: 174 mg/dL — ABNORMAL HIGH (ref 70–99)
Glucose-Capillary: 243 mg/dL — ABNORMAL HIGH (ref 70–99)
Glucose-Capillary: 371 mg/dL — ABNORMAL HIGH (ref 70–99)

## 2020-05-14 LAB — CBC
HCT: 26.5 % — ABNORMAL LOW (ref 36.0–46.0)
HCT: 27.6 % — ABNORMAL LOW (ref 36.0–46.0)
Hemoglobin: 8.1 g/dL — ABNORMAL LOW (ref 12.0–15.0)
Hemoglobin: 8.3 g/dL — ABNORMAL LOW (ref 12.0–15.0)
MCH: 28.6 pg (ref 26.0–34.0)
MCH: 28.2 pg (ref 26.0–34.0)
MCHC: 30.6 g/dL (ref 30.0–36.0)
MCHC: 30.1 g/dL (ref 30.0–36.0)
MCV: 93.6 fL (ref 80.0–100.0)
MCV: 93.9 fL (ref 80.0–100.0)
Platelets: 96 10*3/uL — ABNORMAL LOW (ref 150–400)
Platelets: 92 10*3/uL — ABNORMAL LOW (ref 150–400)
RBC: 2.83 MIL/uL — ABNORMAL LOW (ref 3.87–5.11)
RBC: 2.94 MIL/uL — ABNORMAL LOW (ref 3.87–5.11)
RDW: 16.3 % — ABNORMAL HIGH (ref 11.5–15.5)
RDW: 16.1 % — ABNORMAL HIGH (ref 11.5–15.5)
WBC: 5 10*3/uL (ref 4.0–10.5)
WBC: 6.5 10*3/uL (ref 4.0–10.5)
nRBC: 0 % (ref 0.0–0.2)
nRBC: 0 % (ref 0.0–0.2)

## 2020-05-14 LAB — TYPE AND SCREEN
ABO/RH(D): A POS
Antibody Screen: NEGATIVE
Unit division: 0
Unit division: 0
Unit division: 0
Unit division: 0
Unit division: 0
Unit division: 0

## 2020-05-14 LAB — HEPARIN LEVEL (UNFRACTIONATED): Heparin Unfractionated: 0.13 IU/mL — ABNORMAL LOW (ref 0.30–0.70)

## 2020-05-14 LAB — MAGNESIUM: Magnesium: 1.7 mg/dL (ref 1.7–2.4)

## 2020-05-14 MED ORDER — UMECLIDINIUM BROMIDE 62.5 MCG/INH IN AEPB
1.0000 | INHALATION_SPRAY | Freq: Every day | RESPIRATORY_TRACT | Status: DC
  Administered 2020-05-17 – 2020-06-09 (×20): 1 via RESPIRATORY_TRACT
  Filled 2020-05-14 (×5): qty 7

## 2020-05-14 MED ORDER — GUAIFENESIN ER 600 MG PO TB12
600.0000 mg | ORAL_TABLET | Freq: Two times a day (BID) | ORAL | Status: DC
  Administered 2020-05-14 – 2020-06-10 (×55): 600 mg via ORAL
  Filled 2020-05-14 (×56): qty 1

## 2020-05-14 MED ORDER — POLYETHYLENE GLYCOL 3350 17 G PO PACK
17.0000 g | PACK | Freq: Two times a day (BID) | ORAL | Status: DC | PRN
  Administered 2020-05-28 – 2020-06-08 (×3): 17 g via ORAL
  Filled 2020-05-14 (×4): qty 1

## 2020-05-14 MED ORDER — LEVALBUTEROL HCL 0.63 MG/3ML IN NEBU
0.6300 mg | INHALATION_SOLUTION | Freq: Four times a day (QID) | RESPIRATORY_TRACT | Status: DC
  Administered 2020-05-14 – 2020-05-15 (×4): 0.63 mg via RESPIRATORY_TRACT
  Filled 2020-05-14 (×4): qty 3

## 2020-05-14 MED ORDER — FUROSEMIDE 10 MG/ML IJ SOLN
40.0000 mg | Freq: Two times a day (BID) | INTRAMUSCULAR | Status: DC
  Administered 2020-05-14 – 2020-05-25 (×23): 40 mg via INTRAVENOUS
  Filled 2020-05-14 (×23): qty 4

## 2020-05-14 MED ORDER — HEPARIN (PORCINE) 25000 UT/250ML-% IV SOLN
2550.0000 [IU]/h | INTRAVENOUS | Status: DC
  Administered 2020-05-14: 1600 [IU]/h via INTRAVENOUS
  Administered 2020-05-14: 1800 [IU]/h via INTRAVENOUS
  Administered 2020-05-15: 2400 [IU]/h via INTRAVENOUS
  Administered 2020-05-16 – 2020-05-17 (×3): 2550 [IU]/h via INTRAVENOUS
  Filled 2020-05-14 (×7): qty 250

## 2020-05-14 MED ORDER — MAGNESIUM CITRATE PO SOLN: 1.0000 | Freq: Once | ORAL | Status: DC

## 2020-05-14 MED ORDER — MAGNESIUM CITRATE PO SOLN
1.0000 | Freq: Every day | ORAL | Status: DC | PRN
  Administered 2020-06-06: 1 via ORAL
  Filled 2020-05-14 (×2): qty 296

## 2020-05-14 MED FILL — Sodium Chloride IV Soln 0.9%: INTRAVENOUS | Qty: 25 | Status: AC

## 2020-05-14 MED FILL — Morphine Sulfate IV Soln PF 10 MG/ML: INTRAVENOUS | Qty: 5 | Status: AC

## 2020-05-14 NOTE — Progress Notes (Signed)
PROGRESS NOTE  Annette ModenaKathy [name] ZOX:[id number] DOB: 03-31-1961   PCP: Patient, No Pcp Per  Patient is from: Home  DOA: 05/07/2020 LOS: 7  Brief Narrative / Interim history: [age] yr F with recent COVID-19 about 2 months ago, recent PE on Eliquis, COPD/Asthma not on oxygen, DM-2, HTN and HLD who was admitted to [facility] on 05/03/2020 where she was admitted for RUL MRSA pneumonia and RLL subsegmental PE (TTE with mod PAH but no right heart strain) treated with vancomycin, then Zyvox,  and Lovenox.  She developed right-sided hydropneumothorax on 11/22 underwent chest tube insertion on 05/06/2020 and transferred to [facility] for CTS evaluation and treatment.   On arrival here, CT chest without contrast with moderately large right pneumothorax, subcutaneous emphysema throughout the right chest wall extending into right neck, pneumomediastinum, right upper lobe opacity and emphysema.  IR consulted by CTS and placed additional chest tube to right chest on 05/08/2020.    On 11/26, chest x-ray with near resolution of pneumothorax but persistent right lung opacity.  IV Unasyn added. Also had significant bleeding around chest tube.  Hemoglobin dropped 3 g.  Heparin held.  Transfused 2 units and stabilized.  CTA chest with new nonocclusive PE in RLL, persistent moderate right-sided pneumothorax with mediastinal shift to the left, new moderate sized right-sided pneumothorax, changed chest tube possibly causing pulmonary laceration and new large right flank hematoma measuring 12 cm without definite evidence of active extravasation, diffuse wall thickening of gallbladder, coarse airspace opacities and pulmonary fibrosis.   On 11/28, patient underwent VATS/thoracotomy mechanical pleurodesis and wound VAC placement, and sent to ICU out of concern about her respiratory status but remained stable 4 L oxygen by nasal cannula.  On 11/30, transferred out of ICU to progressive unit.  Heparin resumed.   Also started on IV Lasix by CTS  Subjective: Seen and examined earlier this morning.  Reports having a long night partly due to pain.  She rates her pain 6/10 now.  Coughing intermittently.  Denies shortness of breath.  Denies GI or UTI symptoms.  Objective: Vitals:   05/14/20 0900 05/14/20 1113 05/14/20 1150 05/14/20 1200  BP:      Pulse: (!) 104   (!) 114  Resp: 16  (!) 27 19  Temp:  99 F (37.2 C)    TempSrc:  Oral    SpO2: 94%  90% 91%  Weight:      Height:        Intake/Output Summary (Last 24 hours) at 05/14/2020 1218 Last data filed at 05/14/2020 1200 Gross per 24 hour  Intake 1779.23 ml  Output 2370 ml  Net -590.77 ml   Filed Weights   05/09/20 0526 05/12/20 0420 05/13/20 0600  Weight: 113.7 kg 111.8 kg 111.7 kg    Examination:  GENERAL: No apparent distress.  Sitting on the edge of the bed working with therapy. HEENT: MMM.  Vision and hearing grossly intact.  NECK: Supple.  No apparent JVD.  RESP:  No IWOB.  Diminished aeration bilaterally.  Rhonchi.  Chest tube to right chest with serosanguineous output.  Wound VAC on the right chest laterally. CVS:  RRR. Heart sounds normal.  ABD/GI/GU: BS+. Abd soft, NTND.  MSK/EXT:  Moves extremities. No apparent deformity. No edema.  SKIN: Ecchymosis lateral to the right breast.  Wound VAC in place.  Small serosanguineous fluid in canister. NEURO: Awake, alert and oriented appropriately.  No apparent focal neuro deficit. PSYCH: Calm. Normal affect.  Procedures:  05/06/2020-right chest tube  placement at outside hospital 05/08/2020-second right chest tube placement by IR  05/12/2020-VATS/thoracotomy mechanical pleurodesis and wound VAC placement   Microbiology summarized: Blood cultures NGTD. MRSA PCR positive.  Assessment & Plan: Right-sided pneumothorax with mediastinal shift: s/p VATS/thoracotomy mechanical pleurodesis Large right flank hematoma measuring about 12 cm-s/p evacuation and wound VAC Acute blood  loss anemia: Hgb 11>> 7.7>2u> 8.5>7.8>1u> 8.6> 8.1.  Received 3 units so far. -AM CXR with persistent right-sided volume loss with increasing consolidation.  No PTX. -Monitor H&H and transfuse for Hgb less than 7.0 -Now on IV Lasix 40 mg twice daily per CTS-we will monitor intake and output -Pain control and bowel regimen.  Right upper lobe MRSA pneumonia: No fever but leukocytosis.  CXR as above.  MRSA PCR positive.  Mild intermittent temps but no clear fever.  Leukocytosis resolved. -Vancomycin 11/19-11/21>> linezolid 11/21>> 11/30 -IV Unasyn 11/26>>> -Check respiratory culture  Acute RLL subsegmental PE: TTE at OSH with moderate PAH but no right heart strain. -Resume heparin.  Check CBC in the afternoon -Follow LE venous Doppler.  Acute hypoxemic respiratory failure: Likely due to the above.  Stable on 3 to 4 L by nasal cannula. -Wean oxygen as able.  Minimum oxygen to keep saturation above 88% -Encourage incentive spirometry -Mobilize -Treat treatable causes as above  Uncontrolled noninsulin-dependent type II diabetes with hyperglycemia: A1c 10.7%. Recent Labs  Lab 05/13/20 1938 05/13/20 2332 05/14/20 0349 05/14/20 0649 05/14/20 1057  GLUCAP 227* 213* 166* 102* 174*  -Continue SSI-moderate -Continue NovoLog 8 units AC -Continue Lantus 25 units twice daily -Continue home statin.  Chronic COPD: Stable -On Xopenex and Incruse Ellipta now  Hyperlipidemia -Continue Lipitor and fenofibrate  GERD -Continue PPI  Anxiety: Stable -Continue home Klonopin  Neuropathy/chronic pain -Continue home Lyrica  History of COVID-19 infection: Treated for this about 2 months ago.  Leukocytosis/bandemia: Likely due to #1.  Resolved. -Continue monitoring  Thrombocytopenia: Platelet 126>> 113> 144> 96.  Consumptive from blood clot? -Continue monitoring  Intertrigo/cutaneous candidiasis -Diflucan 150 mg every 72 hours x2 -Topical nystatin powder  Morbid obesity: Body mass  index is 37.44 kg/m. Nutrition Problem: Increased nutrient needs Etiology: wound healing, acute illness (recent COVID PNA) Signs/Symptoms: estimated needs Interventions: MVI, Ensure Enlive (each supplement provides 350kcal and 20 grams of protein)   Stage II pressure skin injury: POA Pressure Injury 05/07/20 Coccyx Medial;Mid Stage 2 -  Partial thickness loss of dermis presenting as a shallow open injury with a red, pink wound bed without slough. (Active)  05/07/20 2100  Location: Coccyx  Location Orientation: Medial;Mid  Staging: Stage 2 -  Partial thickness loss of dermis presenting as a shallow open injury with a red, pink wound bed without slough.  Wound Description (Comments):   Present on Admission: Yes   DVT prophylaxis:  Off IV heparin due to acute blood loss  Code Status: Full code Family Communication: Patient and/or RN. Available if any question.  Status is: Inpatient.   Remains inpatient appropriate because:Ongoing active pain requiring inpatient pain management, Unsafe d/c plan, IV treatments appropriate due to intensity of illness or inability to take PO and Inpatient level of care appropriate due to severity of illness   Dispo: The patient is from: Home              Anticipated d/c is to: Home with home health              Anticipated d/c date is: > 3 days  Patient currently is not medically stable to d/c.       Consultants:  Cardiothoracic surgery Interventional radiology PCCM   Sch Meds:  Scheduled Meds: . acetaminophen  1,000 mg Oral Q6H   Or  . acetaminophen (TYLENOL) oral liquid 160 mg/5 mL  1,000 mg Oral Q6H  . atorvastatin  40 mg Oral Daily  . bisacodyl  10 mg Oral Daily  . Chlorhexidine Gluconate Cloth  6 each Topical Daily  . cholecalciferol  1,000 Units Oral Daily  . clonazePAM  0.5 mg Oral BID  . fenofibrate  160 mg Oral Daily  . furosemide  40 mg Intravenous BID  . guaiFENesin  600 mg Oral BID  . insulin aspart  0-24 Units  Subcutaneous Q4H  . insulin aspart  5 Units Subcutaneous TID WC  . insulin glargine  20 Units Subcutaneous QHS  . levalbuterol  0.63 mg Nebulization Q6H  . lidocaine  1 patch Transdermal Q24H  . morphine   Intravenous Q4H  . multivitamin with minerals  1 tablet Oral Daily  . mupirocin ointment  1 application Nasal BID  . nystatin   Topical BID  . pregabalin  150 mg Oral TID  . Ensure Max Protein  11 oz Oral BID  . senna-docusate  1 tablet Oral QHS   Continuous Infusions: . ampicillin-sulbactam (UNASYN) IV Stopped (05/14/20 0941)  . heparin 1,600 Units/hr (05/14/20 1200)   PRN Meds:.diphenhydrAMINE **OR** diphenhydrAMINE, influenza vac split quadrivalent PF, naloxone **AND** sodium chloride flush, ondansetron (ZOFRAN) IV, oxyCODONE, pneumococcal 23 valent vaccine, traMADol  Antimicrobials: Anti-infectives (From admission, onward)   Start     Dose/Rate Route Frequency Ordered Stop   05/10/20 1300  fluconazole (DIFLUCAN) tablet 150 mg        150 mg Oral every 72 hours 05/10/20 1151 05/13/20 1408   05/10/20 0900  Ampicillin-Sulbactam (UNASYN) 3 g in sodium chloride 0.9 % 100 mL IVPB        3 g 200 mL/hr over 30 Minutes Intravenous Every 6 hours 05/10/20 0753 05/17/20 0859   05/08/20 0030  linezolid (ZYVOX) tablet 600 mg  Status:  Discontinued        600 mg Oral Every 12 hours 05/08/20 0015 05/14/20 1113       I have personally reviewed the following labs and images: CBC: Recent Labs  Lab 05/10/20 0245 05/10/20 1730 05/11/20 0747 05/11/20 0747 05/11/20 1135 05/11/20 2218 05/12/20 0242 05/12/20 1336 05/12/20 1524 05/13/20 0434 05/14/20 0415  WBC 15.8*  --  10.3  --  9.3  --  7.4  --   --  8.7 5.0  NEUTROABS 13.8*  --  8.4*  --   --   --   --   --   --   --   --   HGB 11.0*   < > 8.5*   < > 7.9*   < > 7.8* 7.8* 8.8* 8.6* 8.1*  HCT 35.8*   < > 27.3*   < > 25.5*   < > 25.0* 23.0* 26.0* 28.1* 26.5*  MCV 93.0  --  90.4  --  88.5  --  90.3  --   --  92.4 93.6  PLT 126*  --   127*  --  116*  --  113*  --   --  114* 96*   < > = values in this interval not displayed.   BMP &GFR Recent Labs  Lab 05/09/20 0011 05/09/20 0011 05/10/20 0245 05/10/20 0245 05/11/20 1028 05/11/20 1028 05/12/20 0242  05/12/20 1336 05/12/20 1524 05/13/20 0434 05/14/20 0415  NA 134*   < > 137   < > 137   < > 139 140 138 138 132*  K 4.5   < > 4.7   < > 4.3   < > 4.8 4.6 4.9 4.6 4.2  CL 96*   < > 100  --  98  --  99  --   --  99 96*  CO2 30   < > 33*  --  31  --  33*  --   --  31 33*  GLUCOSE 349*   < > 255*  --  232*  --  169*  --   --  112* 181*  BUN 23*   < > 19  --  26*  --  21*  --   --  13 12  CREATININE 0.69   < > 0.70  --  0.76  --  0.87  --   --  0.57 0.58  CALCIUM 7.4*   < > 8.0*  --  7.7*  --  8.0*  --   --  7.9* 7.6*  MG 1.7  --  1.8  --   --   --  1.8  --   --   --  1.7  PHOS 3.3  --  2.6  --   --   --  3.4  --   --   --   --    < > = values in this interval not displayed.   Estimated Creatinine Clearance: 98 mL/min (by C-G formula based on SCr of 0.58 mg/dL). Liver & Pancreas: Recent Labs  Lab 05/08/20 0016 05/08/20 0016 05/09/20 0011 05/10/20 0245 05/11/20 1028 05/12/20 0242 05/14/20 0415  AST 25  --   --   --  12*  --  11*  ALT 34  --   --   --  13  --  14  ALKPHOS 51  --   --   --  41  --  42  BILITOT 0.8  --   --   --  0.5  --  0.4  PROT 4.2*  --   --   --  3.5*  --  4.1*  ALBUMIN 1.8*   < > 1.5* 1.4* 1.4* 1.4* 1.3*   < > = values in this interval not displayed.   No results for input(s): LIPASE, AMYLASE in the last 168 hours. No results for input(s): AMMONIA in the last 168 hours. Diabetic: No results for input(s): HGBA1C in the last 72 hours. Recent Labs  Lab 05/13/20 1938 05/13/20 2332 05/14/20 0349 05/14/20 0649 05/14/20 1057  GLUCAP 227* 213* 166* 102* 174*   Cardiac Enzymes: No results for input(s): CKTOTAL, CKMB, CKMBINDEX, TROPONINI in the last 168 hours. No results for input(s): PROBNP in the last 8760 hours. Coagulation  Profile: Recent Labs  Lab 05/11/20 1135  INR 1.0   Thyroid Function Tests: No results for input(s): TSH, T4TOTAL, FREET4, T3FREE, THYROIDAB in the last 72 hours. Lipid Profile: No results for input(s): CHOL, HDL, LDLCALC, TRIG, CHOLHDL, LDLDIRECT in the last 72 hours. Anemia Panel: No results for input(s): VITAMINB12, FOLATE, FERRITIN, TIBC, IRON, RETICCTPCT in the last 72 hours. Urine analysis: No results found for: COLORURINE, APPEARANCEUR, LABSPEC, PHURINE, GLUCOSEU, HGBUR, BILIRUBINUR, KETONESUR, PROTEINUR, UROBILINOGEN, NITRITE, LEUKOCYTESUR Sepsis Labs: Invalid input(s): PROCALCITONIN, LACTICIDVEN  Microbiology: Recent Results (from the past 240 hour(s))  Culture, blood (routine x 2)     Status: None  Collection Time: 05/08/20 12:16 AM   Specimen: BLOOD  Result Value Ref Range Status   Specimen Description BLOOD LEFT HAND  Final   Special Requests   Final    BOTTLES DRAWN AEROBIC AND ANAEROBIC Blood Culture results may not be optimal due to an excessive volume of blood received in culture bottles   Culture   Final    NO GROWTH 5 DAYS Performed at [facility], [address], [place], [place] [address]    Report Status 05/13/2020 FINAL  Final  Culture, blood (routine x 2)     Status: None   Collection Time: 05/08/20 12:16 AM   Specimen: BLOOD  Result Value Ref Range Status   Specimen Description BLOOD RIGHT ARM  Final   Special Requests   Final    BOTTLES DRAWN AEROBIC AND ANAEROBIC Blood Culture results may not be optimal due to an excessive volume of blood received in culture bottles   Culture   Final    NO GROWTH 5 DAYS Performed at [facility], [address] [place], [place] [address]    Report Status 05/13/2020 FINAL  Final  Surgical pcr screen     Status: Abnormal   Collection Time: 05/11/20 11:55 AM   Specimen: Nasal Mucosa; Nasal Swab  Result Value Ref Range Status   MRSA, PCR POSITIVE (A) NEGATIVE Final    Comment: RESULT CALLED TO, READ  BACK BY AND VERIFIED WITH: RN A TEPE [id number]    Staphylococcus aureus POSITIVE (A) NEGATIVE Final    Comment: (NOTE) The Xpert SA Assay (FDA approved for NASAL specimens in patients 36 years of age and older), is one component of a comprehensive surveillance program. It is not intended to diagnose infection nor to guide or monitor treatment. Performed at [facility], [address], [place], [place] [address]     Radiology Studies: DG CHEST PORT 1 VIEW  Result Date: 05/14/2020 CLINICAL DATA:  Status post VATS, dyspnea EXAM: PORTABLE CHEST 1 VIEW COMPARISON:  05/13/2020 FINDINGS: Right internal jugular central venous catheter with its tip overlying the superior right atrium and 3 right-sided large bore chest tubes are unchanged in position. There is persistent, stable right-sided volume loss with extensive consolidation throughout the right lung, likely infectious, inflammatory, or postsurgical in nature. Consolidation has progressed slightly in the interval since prior examination. Patchy infiltrate has progressed peripherally within the left lung base. No pneumothorax. Cardiac size within normal limits. IMPRESSION: Persistent right-sided volume loss with increasing consolidation within the right lung, likely infectious or inflammatory in nature. Right chest tubes in place. No pneumothorax or definite pleural effusion identified. Increasing patchy infiltrate within the left lung base, likely infectious or inflammatory. Electronically Signed   By: Helyn Numbers MD   On: 05/14/2020 06:26     Azaliyah Kennard T. Reece Mcbroom [facility]  If 7PM-7AM, please contact night-coverage [email or web address] 05/14/2020, 12:18 PM

## 2020-05-14 NOTE — Progress Notes (Signed)
NAME:  Annette [name], MRN:  [id number], DOB:  09-11-1960, LOS: 7 ADMISSION DATE:  05/07/2020, CONSULTATION DATE:  11/228/2021 REFERRING MD:  Vickey Sages - TCTS, CHIEF COMPLAINT:  Right chest hematoma.   HPI/course in hospital  59 year old woman who is admitted to the CTICU following VATS evacuation of chest hematoma.   Recent COVID pneumonia.  She was transferred to [facility] on 11/23 from [facility] where she was admitted with a right upper lobe pneumonia due to MRSA. Also concurrently found to have a right lower lobe subsegmental PE. She was started on linezolid and heparin, but developed right hydropneumothorax 2 days later.   11/24 - IR directed chest tube placement with near complete resolution of the pneumothorax.  11/26 - began to bleed around chest tube with 3g/L HB drop. Transfused.  11/27 - stabilized following transfusion. 11/28 - OR for definitive management underwent video-assisted evacuation of hematoma and mechanical pleurodesis under general anesthesia with lung isolation.  Past Medical History   Past Medical History:  Diagnosis Date  . Anxiety   . Depression   . Diabetes mellitus without complication (HCC)    Type II  . Hypertension   . Pneumonia       Interim history/subjective:  She is OOB in the chair. Awake and alert She does complain of right CT pain and R CVC pain, No leak per CT T Max is 9.2 HGB 8.1 Serosanguinous drainage per CT  With 25 cc drainage out per flowsheet. Remains on 3 L [place] Desaturated to 78% off oxygen   Net - 3.7  L  CXR 11/30 >> Persistent right-sided volume loss with increasing consolidation within the right lung, likely infectious or inflammatory in nature. Right chest tubes in place. No pneumothorax or definite pleural effusion identified. Increasing patchy infiltrate within the left lung base, likely infectious or inflammatory  Objective   Blood pressure (!) 94/49, pulse (!) 104, temperature 98.2 F (36.8 C), temperature source  Oral, resp. rate 16, height 5\' 8"  (1.727 m), weight 111.7 kg, SpO2 94 %.        Intake/Output Summary (Last 24 hours) at 05/14/2020 0957 Last data filed at 05/14/2020 0600 Gross per 24 hour  Intake 1509.28 ml  Output 1940 ml  Net -430.72 ml   Filed Weights   05/09/20 0526 05/12/20 0420 05/13/20 0600  Weight: 113.7 kg 111.8 kg 111.7 kg     General: Elderly woman OOB in chair,  no acute distress HEENT: [place]/AT, eyes anicteric, oral mucosa moist Cardiac: S1, S2, Regular rate and rhythm, No RMG Respiratory: Mild respiratory splinting, mild tachypnea, no accessory muscle use.  Clear to auscultation bilaterally. Diminished per bases,  Chest tubes with serosanguinous drainage, Abdomen: Soft, nontender, nondistended Extremities: no clubbing or cyanosis Derm: Pallor, no rashes, bruising to right chest around CT  Ancillary tests (personally reviewed)  CBC: Recent Labs  Lab 05/10/20 0245 05/10/20 1730 05/11/20 0747 05/11/20 0747 05/11/20 1135 05/11/20 2218 05/12/20 0242 05/12/20 1336 05/12/20 1524 05/13/20 0434 05/14/20 0415  WBC 15.8*  --  10.3  --  9.3  --  7.4  --   --  8.7 5.0  NEUTROABS 13.8*  --  8.4*  --   --   --   --   --   --   --   --   HGB 11.0*   < > 8.5*   < > 7.9*   < > 7.8* 7.8* 8.8* 8.6* 8.1*  HCT 35.8*   < > 27.3*   < >  25.5*   < > 25.0* 23.0* 26.0* 28.1* 26.5*  MCV 93.0  --  90.4  --  88.5  --  90.3  --   --  92.4 93.6  PLT 126*  --  127*  --  116*  --  113*  --   --  114* 96*   < > = values in this interval not displayed.    Basic Metabolic Panel: Recent Labs  Lab 05/09/20 0011 05/09/20 0011 05/10/20 0245 05/10/20 0245 05/11/20 1028 05/11/20 1028 05/12/20 0242 05/12/20 1336 05/12/20 1524 05/13/20 0434 05/14/20 0415  NA 134*   < > 137   < > 137   < > 139 140 138 138 132*  K 4.5   < > 4.7   < > 4.3   < > 4.8 4.6 4.9 4.6 4.2  CL 96*   < > 100  --  98  --  99  --   --  99 96*  CO2 30   < > 33*  --  31  --  33*  --   --  31 33*  GLUCOSE 349*   < >  255*  --  232*  --  169*  --   --  112* 181*  BUN 23*   < > 19  --  26*  --  21*  --   --  13 12  CREATININE 0.69   < > 0.70  --  0.76  --  0.87  --   --  0.57 0.58  CALCIUM 7.4*   < > 8.0*  --  7.7*  --  8.0*  --   --  7.9* 7.6*  MG 1.7  --  1.8  --   --   --  1.8  --   --   --  1.7  PHOS 3.3  --  2.6  --   --   --  3.4  --   --   --   --    < > = values in this interval not displayed.   GFR: Estimated Creatinine Clearance: 98 mL/min (by C-G formula based on SCr of 0.58 mg/dL). Recent Labs  Lab 05/08/20 0016 05/09/20 0011 05/10/20 0245 05/10/20 0245 05/11/20 0747 05/11/20 0747 05/11/20 1135 05/12/20 0242 05/13/20 0434 05/14/20 0415  PROCALCITON <0.10  --  0.18  --  0.26  --   --   --   --   --   WBC 12.2*   < > 15.8*   < > 10.3   < > 9.3 7.4 8.7 5.0  LATICACIDVEN 1.6  --   --   --   --   --   --   --   --   --    < > = values in this interval not displayed.    Liver Function Tests: Recent Labs  Lab 05/08/20 0016 05/08/20 0016 05/09/20 0011 05/10/20 0245 05/11/20 1028 05/12/20 0242 05/14/20 0415  AST 25  --   --   --  12*  --  11*  ALT 34  --   --   --  13  --  14  ALKPHOS 51  --   --   --  41  --  42  BILITOT 0.8  --   --   --  0.5  --  0.4  PROT 4.2*  --   --   --  3.5*  --  4.1*  ALBUMIN 1.8*   < >  1.5* 1.4* 1.4* 1.4* 1.3*   < > = values in this interval not displayed.   No results for input(s): LIPASE, AMYLASE in the last 168 hours. No results for input(s): AMMONIA in the last 168 hours.  ABG    Component Value Date/Time   PHART 7.400 05/12/2020 1524   PCO2ART 58.4 (H) 05/12/2020 1524   PO2ART 166 (H) 05/12/2020 1524   HCO3 36.1 (H) 05/12/2020 1524   TCO2 38 (H) 05/12/2020 1524   O2SAT 99.0 05/12/2020 1524     Coagulation Profile: Recent Labs  Lab 05/11/20 1135  INR 1.0    Cardiac Enzymes: No results for input(s): CKTOTAL, CKMB, CKMBINDEX, TROPONINI in the last 168 hours.  HbA1C: Hgb A1c MFr Bld  Date/Time Value Ref Range Status  05/08/2020  12:16 AM 10.7 (H) 4.8 - 5.6 % Final    Comment:    (NOTE) Pre diabetes:          5.7%-6.4%  Diabetes:              >6.4%  Glycemic control for   <7.0% adults with diabetes     CBG: Recent Labs  Lab 05/13/20 1528 05/13/20 1938 05/13/20 2332 05/14/20 0349 05/14/20 0649  GLUCAP 213* 227* 213* 166* 102*     Assessment & Plan:   Right chest hematoma and hydropneumothorax requiring VATS and mechanical pleurodesis.  Morbid obesity. -Optimize pain control with multimodal analgesia -Remained stable overnight post extubation -Ongoing chest tube management per CTS.>> No leak 11/30 am  RLL subsegmental PE  - Resume IV heparin 12h post operatively if chest tube output acceptable. Stable to resume this evening with minimal increase in output. - Bilateral venous Dopplers to assess residual leg clot> pending  Acute hypoxic respiratory insufficiency -Supplemental oxygen as required to maintain SPO2 greater than 88%. -Aggressive Pulmonary hygiene - IS Q 1 while awake - Mobilize as much as possible  MRSA pneumonia from outside hospital (basis of this diagnosis unclear) Continue imaging with concern for infiltrates - Continue linezolid and Unasyn  -With definitive drainage should be able to establish final duration of therapy can discontinue linezolid tomorrow. - Aggressive IS and mobilization   COPD without exacerbation -Does not appear to be on long-acting inhalers at home.  Continue albuterol 3 times daily.  If her symptoms are uncontrolled, would escalate to DuoNeb every 4 hours.  Patient may benefit from Spiriva at discharge. Should have pulmonary follow up with PFT's to stage COPD and treatment  Type 2 DM with poor control - Continue basal bolus insulin  Morbid obesity -May require NIPPV at nighttime - Consider Sleep eval per pulmonary at DC ( ? COPD/ OSA overlap syndrome)  Chronic pain and anxiety  -Multimodal pain control -Resume home antidepressants.  Transfer order  written per TCTS for 2C/4E bed  PCCM will sign off today as patient is transferring out of the ICU. Please do not hesitate call if we can be of further assistance, we appreciate the opportunity to participate in Ms. Shehadeh's care.   Daily Goals Checklist  Pain/Anxiety/Delirium protocol (if indicated): IV Tylenol, lidocaine patch, IV Dilaudid, oral oxycodone.  Encourage oral narcotic use. VAP protocol (if indicated): Not intubated Respiratory support goals: Titrate O2 to keep sat greater than 88%, incentive spirometry. Blood pressure target: Keep systolic blood pressure less than 160 DVT prophylaxis: We will resume IV anticoagulation 12-hour stop. Nutritional status and feeding goals: clear liquids overnight.  Progress diet pending on clinical status in the morning GI prophylaxis: Not indicated Fluid  status goals: Allow autoregulation Urinary catheter: Assessment of intravascular volume Central lines: Right IJ double-lumen Glucose control: Phase 1 glycemic Mobility/therapy needs: Progressive ambulation Antibiotic de-escalation: Continue linezolid and Unasyn empirically.  establish final duration of antibiotic therapy Home medication reconciliation: Currently on hold postop Daily labs: CBC, BMP Code Status: Full code Family Communication: Updated patient at bedside, patient is on the phone updating her family Disposition: Stepdown order written 11/30  PCCM will sign off today as patient is transferring out of the ICU. Please do not hesitate call if we can be of further assistance.  Bevelyn Ngo, MSN, AGACNP-BC  Pulmonary/Critical Care Medicine See Amion for personal pager PCCM on call pager [phone] 05/14/2020 9:57 AM

## 2020-05-14 NOTE — Plan of Care (Signed)

## 2020-05-14 NOTE — Progress Notes (Signed)
      [address]       [place] [address]             [phone]      The wound VAC dressing to the open right chest  was changed at the bedside.  The patient utilized her PCA immediately before the procedure.  She remained awake and talking but reasonably comfortable through the procedure.  The wound VAC was turned off.  The draping material was removed then the sponges were soaked with saline before they were gently removed and found to be intact.  The wound was inspected.  It is clean and hemostatic but there was no evidence of granulation yet.  The skin opening measures 4.5 cm in length by 4 cm in width.  The wound depth is 4.5 cm.  There is another 4 to 5 cm of undermining of the wound that extends anteriorly from the skin opening.  The wound was irrigated with sterile normal saline.  A new sponge was fashioned and replaced in the base of the wound.  A second smaller piece in the shape of the skin opening was then placed on top of the first sponge.  The VAC drape was replaced and the suction disc applied.  There is no evidence of air leak after negative pressure therapy was resumed.  Gaynelle Arabian, PA-C [phone]

## 2020-05-14 NOTE — Evaluation (Signed)
Occupational Therapy Evaluation Patient Details Name: Annette [name] MRN: [id number] DOB: 06/02/1961 Today's Date: 05/14/2020    History of Present Illness 59 yo female admitted to [facility] on 05/03/2020 with R upper lobe MRSA pneumonia and R lower lobe subsegmental PE. Developed right-sided hydropneumothorax on 11/22 underwent chest tube insertion and on 05/06/2020 transfered to [facility] for CTS evaluation and treatment. Additional R chest tube placement 11/24.  On 11/28, patient underwent VATS/thoracotomy mechanical pleurodesis and wound VAC placement. PMH including Covid pneumonia approximately 2 months ago, recent PE on Eliquis, COPD, asthma, depression, hypertension, and diabetes.   Clinical Impression   PTA, pt was living with her son and grandson and performed UB ADLs and functional transfers with rollator; family assist with LB ADLs and IADLs. Pt presenting with decreased balance, strength, and activity tolerance. Pt very motivated to participate in therapy and return to PLOF. SpO2 dropping to 77% on RA; placed back on 4L and SpO2 elevating to 90s.  Pt would benefit from further acute OT to facilitate safe dc. Recommend dc to home with HHOT for further OT to optimize safety, independence with ADLs, and return to PLOF.     Follow Up Recommendations  Home health OT;Supervision/Assistance - 24 hour (Pending progress)    Equipment Recommendations  3 in 1 bedside commode    Recommendations for Other Services PT consult     Precautions / Restrictions Precautions Precautions: Fall Precaution Comments: Chest tube, VAC Restrictions Weight Bearing Restrictions: No      Mobility Bed Mobility Overal bed mobility: Needs Assistance Bed Mobility: Supine to Sit     Supine to sit: Min assist     General bed mobility comments: Some assist for LEs and elevation of trunk.      Transfers Overall transfer level: Needs assistance   Transfers: Sit to/from Stand Sit  to Stand: Min assist;Mod assist;+2 safety/equipment;From elevated surface         General transfer comment: Pt able to come to stand with some assist with use of pad for power up.  Moved pt to chair in the [place].     Balance                                           ADL either performed or assessed with clinical judgement   ADL Overall ADL's : Needs assistance/impaired Eating/Feeding: Set up;Sitting   Grooming: Wash/dry face;Min guard;Sitting   Upper Body Bathing: Moderate assistance;Sitting   Lower Body Bathing: Maximal assistance;Sit to/from stand   Upper Body Dressing : Moderate assistance;Sitting   Lower Body Dressing: Maximal assistance;Sit to/from stand Lower Body Dressing Details (indicate cue type and reason): don socks Toilet Transfer: Minimal assistance;+2 for physical assistance;+2 for safety/equipment (sit<>stand with stedy; simulated to recliner) Toilet Transfer Details (indicate cue type and reason): Min-Mod A +2 for power up into standing         Functional mobility during ADLs: Moderate assistance;+2 for physical assistance (sit<>stand with stedy; simulated to recliner) General ADL Comments: Pt presenting with poor balance, strength, and activity tolerance     Vision         Perception     Praxis      Pertinent Vitals/Pain Pain Assessment: Faces Faces Pain Scale: Hurts even more Pain Location: neuropathy, back  Pain Descriptors / Indicators: Aching;Discomfort;Grimacing;Guarding Pain Intervention(s): Monitored during session;Limited activity within patient's tolerance;Repositioned     Hand  Dominance Right   Extremity/Trunk Assessment Upper Extremity Assessment Upper Extremity Assessment: Generalized weakness   Lower Extremity Assessment Lower Extremity Assessment: Defer to PT evaluation   Cervical / Trunk Assessment Cervical / Trunk Assessment: Kyphotic   Communication Communication Communication: No difficulties    Cognition Arousal/Alertness: Awake/alert Behavior During Therapy: WFL for tasks assessed/performed Overall Cognitive Status: Within Functional Limits for tasks assessed                                     General Comments  SpO2 77% on RA; placed back on 3L and pt returned to 90%. VSS overall stable.     Exercises     Shoulder Instructions      Home Living Family/patient expects to be discharged to:: Private residence Living Arrangements: Children Available Help at Discharge: Family;Available 24 hours/day Type of Home: House Home Access: Ramped entrance     Home Layout: One level     Bathroom Shower/Tub: Walk-in shower         Home Equipment: Environmental consultant - 4 wheels;Shower seat - built in;Hand held shower head;Wheelchair - manual   Additional Comments: Needs grab bars and 3N1      Prior Functioning/Environment Level of Independence: Needs assistance  Gait / Transfers Assistance Needed: Pt transferred with rollator to wheelchair with Modif I ADL's / Homemaking Assistance Needed: Pt would get into shower with help and had asssist bathing, assist with dressing lower body.             OT Problem List: Decreased strength;Decreased range of motion;Decreased activity tolerance;Impaired balance (sitting and/or standing);Decreased knowledge of use of DME or AE;Decreased knowledge of precautions;Cardiopulmonary status limiting activity      OT Treatment/Interventions: Self-care/ADL training;Therapeutic exercise;Energy conservation;DME and/or AE instruction;Therapeutic activities;Patient/family education    OT Goals(Current goals can be found in the care plan section) Acute Rehab OT Goals Patient Stated Goal: to go home OT Goal Formulation: With patient Time For Goal Achievement: 05/28/20 Potential to Achieve Goals: Good  OT Frequency: Min 2X/week   Barriers to D/C:            Co-evaluation PT/OT/SLP Co-Evaluation/Treatment: Yes Reason for Co-Treatment:  For patient/therapist safety;To address functional/ADL transfers PT goals addressed during session: Mobility/safety with mobility OT goals addressed during session: ADL's and self-care      AM-PAC OT "6 Clicks" Daily Activity     Outcome Measure Help from another person eating meals?: None Help from another person taking care of personal grooming?: A Little Help from another person toileting, which includes using toliet, bedpan, or urinal?: A Lot Help from another person bathing (including washing, rinsing, drying)?: A Lot Help from another person to put on and taking off regular upper body clothing?: A Little Help from another person to put on and taking off regular lower body clothing?: A Lot 6 Click Score: 16   End of Session Equipment Utilized During Treatment: Oxygen Nurse Communication: Mobility status  Activity Tolerance: Patient tolerated treatment well Patient left: in chair;with call bell/phone within reach;with chair alarm set  OT Visit Diagnosis: Unsteadiness on feet (R26.81);Other abnormalities of gait and mobility (R26.89);Muscle weakness (generalized) (M62.81)                Time: 6389-3734 OT Time Calculation (min): 20 min Charges:  OT General Charges $OT Visit: 1 Visit OT Evaluation $OT Eval Moderate Complexity: 1 Mod  Annette [name] MSOT, OTR/L Acute Rehab Pager: [phone]  Office: [phone]  Theodoro Grist Rudolph Dobler 05/14/2020, 1:23 PM

## 2020-05-14 NOTE — Evaluation (Addendum)
Physical Therapy Evaluation Patient Details Name: Annette [name] MRN: [id number] DOB: 1960/12/07 Today's Date: 05/14/2020   History of Present Illness  59 yo female admitted to [facility] on 05/03/2020 with R upper lobe MRSA pneumonia and R lower lobe subsegmental PE. Developed right-sided hydropneumothorax on 11/22 underwent chest tube insertion and on 05/06/2020 transfered to [facility] for CTS evaluation and treatment. Additional R chest tube placement 11/24.  On 11/28, patient underwent VATS/thoracotomy mechanical pleurodesis and wound VAC placement. PMH including Covid pneumonia approximately 2 months ago, recent PE on Eliquis, COPD, asthma, depression, hypertension, and diabetes.  Clinical Impression  Pt admitted with above diagnosis. Pt was able to move to the recliner with use of Stedy with varying assist min to mod assist with use of pad to help power up.  Pt fatigues quickly and did not do more than transfer PTA. Pt should progress and be able to go home with family assist. Will follow acutely. Pt currently with functional limitations due to the deficits listed below (see PT Problem List). Pt will benefit from skilled PT to increase their independence and safety with mobility to allow discharge to the venue listed below.      Follow Up Recommendations Home health PT;Supervision/Assistance - 24 hour    Equipment Recommendations  3in1 (PT)    Recommendations for Other Services       Precautions / Restrictions Precautions Precautions: Fall Precaution Comments: Chest tube, VAC Restrictions Weight Bearing Restrictions: No      Mobility  Bed Mobility Overal bed mobility: Needs Assistance Bed Mobility: Supine to Sit     Supine to sit: Min assist     General bed mobility comments: Some assist for LEs and elevation of trunk.      Transfers Overall transfer level: Needs assistance   Transfers: Sit to/from Stand Sit to Stand: Min assist;Mod assist;+2  safety/equipment;From elevated surface         General transfer comment: Pt able to come to stand with some assist with use of pad for power up.  Moved pt to chair in the [place].   Ambulation/Gait                Stairs            Wheelchair Mobility    Modified Rankin (Stroke Patients Only)       Balance                                             Pertinent Vitals/Pain Pain Assessment: Faces Faces Pain Scale: Hurts even more Pain Location: neuropathy, back  Pain Descriptors / Indicators: Aching;Discomfort;Grimacing;Guarding Pain Intervention(s): Limited activity within patient's tolerance;Monitored during session;Repositioned    Home Living Family/patient expects to be discharged to:: Private residence Living Arrangements: Children Available Help at Discharge: Family;Available 24 hours/day Type of Home: House Home Access: Ramped entrance     Home Layout: One level Home Equipment: Walker - 4 wheels;Shower seat - built in;Hand held shower head;Wheelchair - manual      Prior Function Level of Independence: Needs assistance   Gait / Transfers Assistance Needed: Pt transferred with rollator to wheelchair with Modif I  ADL's / Homemaking Assistance Needed: Pt would get into shower with help and had asssist bathing, assist with dressing lower body.         Hand Dominance   Dominant Hand: Right  Extremity/Trunk Assessment   Upper Extremity Assessment Upper Extremity Assessment: Defer to OT evaluation    Lower Extremity Assessment Lower Extremity Assessment: Generalized weakness    Cervical / Trunk Assessment Cervical / Trunk Assessment: Kyphotic  Communication   Communication: No difficulties  Cognition Arousal/Alertness: Awake/alert Behavior During Therapy: WFL for tasks assessed/performed Overall Cognitive Status: Within Functional Limits for tasks assessed                                         General Comments General comments (skin integrity, edema, etc.): SpO2 77% on RA; placed back on 3L and pt returned to 90%. VSS overall stable.     Exercises     Assessment/Plan    PT Assessment Patient needs continued PT services  PT Problem List Decreased activity tolerance;Decreased balance;Decreased mobility;Decreased knowledge of use of DME;Decreased safety awareness;Decreased knowledge of precautions;Cardiopulmonary status limiting activity;Obesity       PT Treatment Interventions DME instruction;Gait training;Functional mobility training;Therapeutic activities;Therapeutic exercise;Balance training;Patient/family education    PT Goals (Current goals can be found in the Care Plan section)  Acute Rehab PT Goals Patient Stated Goal: to go home PT Goal Formulation: With patient Time For Goal Achievement: 05/28/20 Potential to Achieve Goals: Good    Frequency Min 3X/week   Barriers to discharge        Co-evaluation PT/OT/SLP Co-Evaluation/Treatment: Yes Reason for Co-Treatment: Complexity of the patient's impairments (multi-system involvement);For patient/therapist safety PT goals addressed during session: Mobility/safety with mobility         AM-PAC PT "6 Clicks" Mobility  Outcome Measure Help needed turning from your back to your side while in a flat bed without using bedrails?: A Little Help needed moving from lying on your back to sitting on the side of a flat bed without using bedrails?: A Lot Help needed moving to and from a bed to a chair (including a wheelchair)?: A Lot Help needed standing up from a chair using your arms (e.g., wheelchair or bedside chair)?: A Lot Help needed to walk in hospital room?: Total Help needed climbing 3-5 steps with a railing? : Total 6 Click Score: 11    End of Session Equipment Utilized During Treatment: Oxygen Activity Tolerance: Patient limited by fatigue Patient left: in chair;with call bell/phone within reach;with chair  alarm set Nurse Communication: Mobility status PT Visit Diagnosis: Muscle weakness (generalized) (M62.81)    Time: 7026-3785 PT Time Calculation (min) (ACUTE ONLY): 27 min   Charges:   PT Evaluation $PT Eval Moderate Complexity: 1 Mod          Karem Tomaso W,PT Acute Rehabilitation Services Pager:  [phone]  Office:  [phone]    Berline Lopes 05/14/2020, 12:16 PM

## 2020-05-14 NOTE — Progress Notes (Signed)
ANTICOAGULATION CONSULT NOTE  Pharmacy Consult for Heparin  Indication: pulmonary embolus  Allergies  Allergen Reactions  . Cymbalta [Duloxetine Hcl]     Psychosis "self-harm"   Patient Measurements: Height: 5\' 8"  (172.7 cm) Weight: 111.7 kg (246 lb 4.1 oz) IBW/kg (Calculated) : 63.9  Vital Signs: Temp: 98.2 F (36.8 C) (11/30 0351) Temp Source: Oral (11/30 0351) BP: 102/66 (11/30 0600) Pulse Rate: 90 (11/30 0700)  Assessment: 59 y/o F transfer from [facility]. Pt has PE and had been on Eliquis recently (last dose not clear) and was transferred on heparin. Pt is s/p chest tube placement and IV heparin was resumed for PE on 11/24. On 11/26, Hgb dropped from 11 to 7.7. CTA showed new right-sided hemothroax. Heparin was subsequently held. Pharmacy has been consulted to restart heparin for PE treatment today. Hgb this morning is 7.8 and Plts are 113. Will not load due to recent hematoma and will initially start at a lower infusion rate than previous. No bleeding noted per nursing today.   Goal of Therapy:  Heparin level 0.3-0.7 units/ml  Goal aPTT 66-102 Monitor platelets by anticoagulation protocol: Yes   Plan:  Restart heparin at 1600 units/hr  Check 8-hour heparin level  Monitor daily HL and CBC  Monitor closely for s/sx of bleed   12/26, PharmD, [facility] Pharmacy Resident [phone] 05/14/2020 8:09 AM

## 2020-05-14 NOTE — Progress Notes (Signed)
2 Days Post-Op Procedure(s) (LRB): VIDEO ASSISTED THORACOSCOPY (VATS)/THOROCOTOMY MECCHANICAL PLEURO DESIS (Right) EVACUATION HEMATOMA (Right) Subjective: Feeling a little better  Objective: Vital signs in last 24 hours: Temp:  [98.2 F (36.8 C)-99.2 F (37.3 C)] 98.2 F (36.8 C) (11/30 0351) Pulse Rate:  [85-105] 100 (11/30 0800) Cardiac Rhythm: Normal sinus rhythm;Sinus tachycardia (11/30 0800) Resp:  [0-22] 22 (11/30 0800) BP: (98-122)/(58-76) 102/66 (11/30 0600) SpO2:  [92 %-98 %] 92 % (11/30 0800) Arterial Line BP: (95-156)/(49-70) 115/49 (11/30 0800)  Hemodynamic parameters for last 24 hours:    Intake/Output from previous day: 11/29 0701 - 11/30 0700 In: 1735.6 [I.V.:1356.8; IV Piggyback:378.8] Out: 2160 [Urine:1975; Drains:25; Chest Tube:160] Intake/Output this shift: No intake/output data recorded.  General appearance: alert and cooperative Neurologic: intact Heart: regular rate and rhythm, S1, S2 normal, no murmur, click, rub or gallop Lungs: diminished breath sounds RML Abdomen: soft, non-tender; bowel sounds normal; no masses,  no organomegaly Extremities: extremities normal, atraumatic, no cyanosis or edema Wound: dressed  Lab Results: Recent Labs    05/13/20 0434 05/14/20 0415  WBC 8.7 5.0  HGB 8.6* 8.1*  HCT 28.1* 26.5*  PLT 114* 96*   BMET:  Recent Labs    05/13/20 0434 05/14/20 0415  NA 138 132*  K 4.6 4.2  CL 99 96*  CO2 31 33*  GLUCOSE 112* 181*  BUN 13 12  CREATININE 0.57 0.58  CALCIUM 7.9* 7.6*    PT/INR:  Recent Labs    05/11/20 1135  LABPROT 13.1  INR 1.0   ABG    Component Value Date/Time   PHART 7.400 05/12/2020 1524   HCO3 36.1 (H) 05/12/2020 1524   TCO2 38 (H) 05/12/2020 1524   O2SAT 99.0 05/12/2020 1524   CBG (last 3)  Recent Labs    05/13/20 2332 05/14/20 0349 05/14/20 0649  GLUCAP 213* 166* 102*    Assessment/Plan: S/P Procedure(s) (LRB): VIDEO ASSISTED THORACOSCOPY (VATS)/THOROCOTOMY MECCHANICAL  PLEURO DESIS (Right) EVACUATION HEMATOMA (Right) Mobilize Diuresis needs wound vac change; could likely be done at the bedside.   Transfer to 2C/4E   LOS: 7 days    [name] 05/14/2020

## 2020-05-14 NOTE — Progress Notes (Signed)
Morphine PCA pump replaced syringe.  Old syringe in pump noted <1 cc.  Wasted in Immunologist.  New syringe inserted/administered.   Annette [name] Annette [name]/Annette [name]

## 2020-05-14 NOTE — Progress Notes (Signed)
Pt arrived from [facility].  A&Ox3.  Vital signs taken.  CCMD notified.   PCA pump noted off with syringe inside.  O2 at 2 L 88%

## 2020-05-14 NOTE — Progress Notes (Signed)
Bilateral lower extremity venous duplex complete.  Please see CV Proc tab for preliminary results. Levin Bacon- RDMS, RVT 1:49 PM  05/14/2020

## 2020-05-14 NOTE — Progress Notes (Signed)
ANTICOAGULATION CONSULT NOTE  Pharmacy Consult for Heparin  Indication: pulmonary embolus  Allergies  Allergen Reactions  . Cymbalta [Duloxetine Hcl]     Psychosis "self-harm"   Patient Measurements: Height: 5\' 8"  (172.7 cm) Weight: 111.7 kg (246 lb 4.1 oz) IBW/kg (Calculated) : 63.9  Vital Signs: Temp: 99 F (37.2 C) (11/30 1113) Temp Source: Oral (11/30 1113) BP: 91/52 (11/30 1415) Pulse Rate: 103 (11/30 1415)  Assessment: 59 y/o F transfer from [facility]. Pt has PE and had been on Eliquis recently (last dose not clear) and was transferred on heparin. Pt is s/p chest tube placement and IV heparin was resumed for PE on 11/24. On 11/26, Hgb dropped from 11 to 7.7. CTA showed new right-sided hemothroax. Heparin was subsequently held. Pharmacy has been consulted to restart heparin for PE treatment today. Hgb this morning is 7.8 and Plts are 113.  Heparin level subtherapeutic at 0.13  Goal of Therapy:  Heparin level 0.3-0.7 units/ml  Goal aPTT 66-102 Monitor platelets by anticoagulation protocol: Yes   Plan:  Increase heparin to 1800 units/hr  Check 8-hour heparin level  Monitor daily HL and CBC  Monitor closely for s/sx of bleed   12/26, PharmD, [facility] Clinical Pharmacist Please see AMION for all Pharmacists' Contact Phone Numbers 05/14/2020, 5:54 PM

## 2020-05-15 ENCOUNTER — Inpatient Hospital Stay (HOSPITAL_COMMUNITY): Payer: Medicare Other

## 2020-05-15 DIAGNOSIS — J9601 Acute respiratory failure with hypoxia: Secondary | ICD-10-CM | POA: Diagnosis not present

## 2020-05-15 DIAGNOSIS — J431 Panlobular emphysema: Secondary | ICD-10-CM

## 2020-05-15 DIAGNOSIS — R918 Other nonspecific abnormal finding of lung field: Secondary | ICD-10-CM

## 2020-05-15 DIAGNOSIS — J15212 Pneumonia due to Methicillin resistant Staphylococcus aureus: Secondary | ICD-10-CM | POA: Diagnosis not present

## 2020-05-15 DIAGNOSIS — J939 Pneumothorax, unspecified: Secondary | ICD-10-CM

## 2020-05-15 DIAGNOSIS — J90 Pleural effusion, not elsewhere classified: Secondary | ICD-10-CM

## 2020-05-15 LAB — RENAL FUNCTION PANEL
Albumin: 1.5 g/dL — ABNORMAL LOW (ref 3.5–5.0)
Anion gap: 9 (ref 5–15)
BUN: 14 mg/dL (ref 6–20)
CO2: 34 mmol/L — ABNORMAL HIGH (ref 22–32)
Calcium: 8.1 mg/dL — ABNORMAL LOW (ref 8.9–10.3)
Chloride: 94 mmol/L — ABNORMAL LOW (ref 98–111)
Creatinine, Ser: 0.62 mg/dL (ref 0.44–1.00)
GFR, Estimated: >60 mL/min (ref >60)
Glucose, Bld: 220 mg/dL — ABNORMAL HIGH (ref 70–99)
Phosphorus: 3.1 mg/dL (ref 2.5–4.6)
Potassium: 4.6 mmol/L (ref 3.5–5.1)
Sodium: 137 mmol/L (ref 135–145)

## 2020-05-15 LAB — HEPARIN LEVEL (UNFRACTIONATED)
Heparin Unfractionated: 0.13 IU/mL — ABNORMAL LOW (ref 0.30–0.70)
Heparin Unfractionated: 0.16 IU/mL — ABNORMAL LOW (ref 0.30–0.70)
Heparin Unfractionated: 0.21 IU/mL — ABNORMAL LOW (ref 0.30–0.70)

## 2020-05-15 LAB — CBC
HCT: 28.9 % — ABNORMAL LOW (ref 36.0–46.0)
Hemoglobin: 8.9 g/dL — ABNORMAL LOW (ref 12.0–15.0)
MCH: 28.8 pg (ref 26.0–34.0)
MCHC: 30.8 g/dL (ref 30.0–36.0)
MCV: 93.5 fL (ref 80.0–100.0)
Platelets: 87 10*3/uL — ABNORMAL LOW (ref 150–400)
RBC: 3.09 MIL/uL — ABNORMAL LOW (ref 3.87–5.11)
RDW: 16.2 % — ABNORMAL HIGH (ref 11.5–15.5)
WBC: 4.5 10*3/uL (ref 4.0–10.5)
nRBC: 0 % (ref 0.0–0.2)

## 2020-05-15 LAB — MAGNESIUM: Magnesium: 1.6 mg/dL — ABNORMAL LOW (ref 1.7–2.4)

## 2020-05-15 LAB — GLUCOSE, CAPILLARY
Glucose-Capillary: 164 mg/dL — ABNORMAL HIGH (ref 70–99)
Glucose-Capillary: 161 mg/dL — ABNORMAL HIGH (ref 70–99)
Glucose-Capillary: 177 mg/dL — ABNORMAL HIGH (ref 70–99)
Glucose-Capillary: 271 mg/dL — ABNORMAL HIGH (ref 70–99)

## 2020-05-15 MED ORDER — LEVALBUTEROL HCL 0.63 MG/3ML IN NEBU: 0.6300 mg | INHALATION_SOLUTION | Freq: Four times a day (QID) | RESPIRATORY_TRACT | Status: DC | PRN

## 2020-05-15 MED ORDER — MAGNESIUM SULFATE 2 GM/50ML IV SOLN
2.0000 g | Freq: Once | INTRAVENOUS | Status: AC
  Administered 2020-05-15: 2 g via INTRAVENOUS
  Filled 2020-05-15: qty 50

## 2020-05-15 MED ORDER — PROSOURCE PLUS PO LIQD
30.0000 mL | Freq: Two times a day (BID) | ORAL | Status: DC
  Administered 2020-05-16 – 2020-05-17 (×2): 30 mL via ORAL
  Filled 2020-05-15 (×5): qty 30

## 2020-05-15 NOTE — Progress Notes (Signed)
PROGRESS NOTE    Annette [name]  LEX:[id number] DOB: 06-29-1960 DOA: 05/07/2020 PCP: Patient, No Pcp Per     Brief Narrative:  59 yr WF with recent COVID-19 about 2 months ago, recent PE on Eliquis, COPD/Asthma not on oxygen, DM-2, HTN and HLD   Admitted to [facility] on 05/03/2020 where she was admitted for RUL MRSA pneumonia and RLL subsegmental PE (TTE with mod PAH but no right heart strain) treated with vancomycin, then Zyvox,  and Lovenox.  She developed right-sidedhydropneumothorax on 11/22 underwent chest tube insertion on 05/06/2020 and transferred to [facility] for CTS evaluation and treatment.   On arrival here, CT chest without contrast with moderately large right pneumothorax, subcutaneous emphysema throughout the right chest wall extending into right neck, pneumomediastinum, right upper lobe opacity and emphysema.  IR consulted by CTS and placed additional chest tube to right chest on 05/08/2020.    On 11/26, chest x-ray with near resolution of pneumothorax but persistent right lung opacity.  IV Unasyn added. Also had significant bleeding around chest tube.  Hemoglobin dropped 3 g.  Heparin held.  Transfused 2 units and stabilized.  CTA chest with new nonocclusive PE in RLL, persistent moderate right-sided pneumothorax with mediastinal shift to the left, new moderate sized right-sided pneumothorax, changed chest tube possibly causing pulmonary laceration and new large right flank hematoma measuring 12 cm without definite evidence of active extravasation, diffuse wall thickening of gallbladder, coarse airspace opacities and pulmonary fibrosis.   On 11/28, patient underwent VATS/thoracotomy mechanical pleurodesis and wound VAC placement, and sent to ICU out of concern about her respiratory status but remained stable 4 L oxygen by nasal cannula.  On 11/30, transferred out of ICU to progressive unit.  Heparin resumed.  Also started on IV Lasix by  CTS   Subjective: Afebrile overnight.  Patient states prior to hospitalization was not on O2 however her PCP was going to evaluate her for O2 requirements.   Assessment & Plan: Covid vaccination; no vaccination (patient had Covid October)   Principal Problem:   MRSA pneumonia (HCC) Active Problems:   Pulmonary embolism (HCC)   Pneumothorax   Acute hypoxemic respiratory failure (HCC)   COPD (chronic obstructive pulmonary disease) (HCC)   Pressure injury of skin   Right-sided pneumothorax with mediastinal shift: -S/p VATS/thoracotomy mechanical pleurodesis -Flutter valve -[name]   Large right flank hematoma measuring about 12 cm- -S/p evacuation and wound VAC\  Acute blood loss anemia:  Lab Results  Component Value Date   HGB 9.1 (L) 05/16/2020   HGB 8.9 (L) 05/15/2020   HGB 8.3 (L) 05/14/2020   HGB 8.1 (L) 05/14/2020   HGB 8.6 (L) 05/13/2020  -11/27 transfuse 2 units PRBC -11/28 transfuse 2 units PRBC   Right upper lobe MRSA pneumonia:  No fever but leukocytosis.  CXR as above.  MRSA PCR positive.  Mild intermittent temps but no clear fever.  Leukocytosis resolved. -Vancomycin 11/19-11/21>> linezolid 11/21>> 11/30 -IV Unasyn 11/26>>> -Check respiratory culture -12/1 PCXR; whole right lung opacified see results below  Acute RLL subsegmental PE:  -TTE at OSH with moderate PAH but no right heart strain. -Resume heparin.  Check CBC in the afternoon -Follow LE venous Doppler.  Acute respiratory failure with hypoxia   likely due to the above.  Stable on 3 to 4 L by nasal cannula. -Wean oxygen as able.  Minimum oxygen to keep saturation above 88% -Encourage incentive spirometry -Mobilize -Treat treatable causes as above  DM type II uncontrolled with hyperglycemia  -  11/24 hemoglobin A1c= 10.7 -Continue SSI-moderate -Continue NovoLog 8 units AC -Continue Lantus 25 units twice daily -Continue home statin.  Chronic COPD:  -Stable -On Xopenex and  Incruse Ellipta now  Hyperlipidemia -Continue Lipitor and fenofibrate  GERD -Continue PPI  Anxiety: Stable -Continue home Klonopin  Neuropathy/chronic pain -Continue home Lyrica  History of COVID-19 infection:  -Treated for this about 2 months ago.  Leukocytosis/bandemia:  -Likely due to #1.  Resolved. -Continue monitoring  Thrombocytopenia:  -Platelet 126>> 113> 144> 96.  Consumptive from blood clot? -Continue monitoring  Intertrigo/cutaneous candidiasis -Diflucan 150 mg every 72 hours x2 -Topical nystatin powder  Morbid obesity: Body mass index is 37.44 kg/m. Nutrition Problem: Increased nutrient needs Etiology: wound healing, acute illness (recent COVID PNA) Signs/Symptoms: estimated needs Interventions: MVI, Ensure Enlive (each supplement provides 350kcal and 20 grams of protein)   Stage II pressure skin injury: POA Pressure Injury 05/07/20 Coccyx Medial;Mid Stage 2 -  Partial thickness loss of dermis presenting as a shallow open injury with a red, pink wound bed without slough. (Active)  05/07/20 2100  Location: Coccyx  Location Orientation: Medial;Mid  Staging: Stage 2 -  Partial thickness loss of dermis presenting as a shallow open injury with a red, pink wound bed without slough.  Wound Description (Comments):   Present on Admission: Yes   Hypomagnesmia -Magnesium goal> 2 -Magnesium IV 2 g  Goals of care -Spoke at length with NCM Kristi patient is a frequent flyer here who is noncompliant refuses SNF.  Will place consult for palliative care   DVT prophylaxis: Heparin subcu Code Status: Full Family Communication:  Status is: Inpatient    Dispo: The patient is from: Home              Anticipated d/c is to:??              Anticipated d/c date is: 12/9              Patient currently unstable      Consultants:  IR Cardiothoracic surgery   Procedures/Significant Events:  05/06/2020-right chest tube placement at outside  hospital 05/08/2020-second right chest tube placement by IR  05/12/2020-VATS/thoracotomy mechanical pleurodesis and wound VAC placement 12/1 PCXR-extensive airspace consolidation throughout much of the right lung, stable. -Left base atelectasis. No new opacity appreciable. Chest tubes remain on the right, unchanged in position, without pneumothorax. Stable cardiac silhouette.  I have personally reviewed and interpreted all radiology studies and my findings are as above.  VENTILATOR SETTINGS: Nasal cannula 12/1 Flow; 4 L/min SPO2 100%   Cultures Blood cultures NGTD. MRSA PCR positive.   Antimicrobials: Anti-infectives (From admission, onward)   Start     Ordered Stop   05/10/20 1300  fluconazole (DIFLUCAN) tablet 150 mg        05/10/20 1151 05/13/20 1408   05/10/20 0900  Ampicillin-Sulbactam (UNASYN) 3 g in sodium chloride 0.9 % 100 mL IVPB        05/10/20 0753 05/17/20 0859   05/08/20 0030  linezolid (ZYVOX) tablet 600 mg  Status:  Discontinued        05/08/20 0015 05/14/20 1113       Devices    LINES / TUBES:      Continuous Infusions:  ampicillin-sulbactam (UNASYN) IV 3 g (05/15/20 0519)   heparin 2,100 Units/hr (05/15/20 0420)     Objective: Vitals:   05/15/20 0248 05/15/20 0427 05/15/20 0446 05/15/20 0647  BP:   106/64   Pulse:   92   Resp:  Temp:   98.4 F (36.9 C)   TempSrc:   Oral   SpO2: 96% 97% 97% 100%  Weight:   112.4 kg   Height:        Intake/Output Summary (Last 24 hours) at 05/15/2020 0805 Last data filed at 05/15/2020 6578 Gross per 24 hour  Intake 950.51 ml  Output 3605 ml  Net -2654.49 ml   Filed Weights   05/12/20 0420 05/13/20 0600 05/15/20 0446  Weight: 111.8 kg 111.7 kg 112.4 kg    Examination:  General: A/O x4, positive acute respiratory distress Eyes: negative scleral hemorrhage, negative anisocoria, negative icterus ENT: Negative Runny nose, negative gingival bleeding, Neck:  Negative scars, masses,  torticollis, lymphadenopathy, JVD Lungs: decreased breath sounds bilateral RIGHT >>> LEFT  without wheezes or crackles Cardiovascular: Regular rate and rhythm without murmur gallop or rub normal S1 and S2.  3 chest tubes present on the RIGHT side draining serosanguineous fluid.  Negative air leak. Abdomen: OBESE, abdominal pain, nondistended, positive soft, bowel sounds, no rebound, no ascites, no appreciable mass Extremities: No significant cyanosis, clubbing, or edema bilateral lower extremities Skin: Negative rashes, lesions, ulcers Psychiatric:  Negative depression, negative anxiety, negative fatigue, negative mania  Central nervous system:  Cranial nerves II through XII intact, tongue/uvula midline, all extremities muscle strength 5/5, sensation intact throughout, negative dysarthria, negative expressive aphasia, negative receptive aphasia.  .     Data Reviewed: Care during the described time interval was provided by me .  I have reviewed this patient's available data, including medical history, events of note, physical examination, and all test results as part of my evaluation.  CBC: Recent Labs  Lab 05/10/20 0245 05/10/20 1730 05/11/20 0747 05/11/20 1135 05/12/20 0242 05/12/20 1336 05/12/20 1524 05/13/20 0434 05/14/20 0415 05/14/20 1320 05/15/20 0202  WBC 15.8*  --  10.3   < > 7.4  --   --  8.7 5.0 6.5 4.5  NEUTROABS 13.8*  --  8.4*  --   --   --   --   --   --   --   --   HGB 11.0*   < > 8.5*   < > 7.8*   < > 8.8* 8.6* 8.1* 8.3* 8.9*  HCT 35.8*   < > 27.3*   < > 25.0*   < > 26.0* 28.1* 26.5* 27.6* 28.9*  MCV 93.0  --  90.4   < > 90.3  --   --  92.4 93.6 93.9 93.5  PLT 126*  --  127*   < > 113*  --   --  114* 96* 92* 87*   < > = values in this interval not displayed.   Basic Metabolic Panel: Recent Labs  Lab 05/09/20 0011 05/09/20 0011 05/10/20 0245 05/10/20 0245 05/11/20 1028 05/11/20 1028 05/12/20 0242 05/12/20 0242 05/12/20 1336 05/12/20 1524 05/13/20 0434  05/14/20 0415 05/15/20 0202  NA 134*   < > 137   < > 137   < > 139   < > 140 138 138 132* 137  K 4.5   < > 4.7   < > 4.3   < > 4.8   < > 4.6 4.9 4.6 4.2 4.6  CL 96*   < > 100   < > 98  --  99  --   --   --  99 96* 94*  CO2 30   < > 33*   < > 31  --  33*  --   --   --  31 33* 34*  GLUCOSE 349*   < > 255*   < > 232*  --  169*  --   --   --  112* 181* 220*  BUN 23*   < > 19   < > 26*  --  21*  --   --   --  13 12 14   CREATININE 0.69   < > 0.70   < > 0.76  --  0.87  --   --   --  0.57 0.58 0.62  CALCIUM 7.4*   < > 8.0*   < > 7.7*  --  8.0*  --   --   --  7.9* 7.6* 8.1*  MG 1.7  --  1.8  --   --   --  1.8  --   --   --   --  1.7 1.6*  PHOS 3.3  --  2.6  --   --   --  3.4  --   --   --   --   --  3.1   < > = values in this interval not displayed.   GFR: Estimated Creatinine Clearance: 98.3 mL/min (by C-G formula based on SCr of 0.62 mg/dL). Liver Function Tests: Recent Labs  Lab 05/10/20 0245 05/11/20 1028 05/12/20 0242 05/14/20 0415 05/15/20 0202  AST  --  12*  --  11*  --   ALT  --  13  --  14  --   ALKPHOS  --  41  --  42  --   BILITOT  --  0.5  --  0.4  --   PROT  --  3.5*  --  4.1*  --   ALBUMIN 1.4* 1.4* 1.4* 1.3* 1.5*   No results for input(s): LIPASE, AMYLASE in the last 168 hours. No results for input(s): AMMONIA in the last 168 hours. Coagulation Profile: Recent Labs  Lab 05/11/20 1135  INR 1.0   Cardiac Enzymes: No results for input(s): CKTOTAL, CKMB, CKMBINDEX, TROPONINI in the last 168 hours. BNP (last 3 results) No results for input(s): PROBNP in the last 8760 hours. HbA1C: No results for input(s): HGBA1C in the last 72 hours. CBG: Recent Labs  Lab 05/14/20 0649 05/14/20 1057 05/14/20 1619 05/14/20 2126 05/15/20 0631  GLUCAP 102* 174* 243* 371* 164*   Lipid Profile: No results for input(s): CHOL, HDL, LDLCALC, TRIG, CHOLHDL, LDLDIRECT in the last 72 hours. Thyroid Function Tests: No results for input(s): TSH, T4TOTAL, FREET4, T3FREE, THYROIDAB in the  last 72 hours. Anemia Panel: No results for input(s): VITAMINB12, FOLATE, FERRITIN, TIBC, IRON, RETICCTPCT in the last 72 hours. Sepsis Labs: Recent Labs  Lab 05/10/20 0245 05/11/20 0747  PROCALCITON 0.18 0.26    Recent Results (from the past 240 hour(s))  Culture, blood (routine x 2)     Status: None   Collection Time: 05/08/20 12:16 AM   Specimen: BLOOD  Result Value Ref Range Status   Specimen Description BLOOD LEFT HAND  Final   Special Requests   Final    BOTTLES DRAWN AEROBIC AND ANAEROBIC Blood Culture results may not be optimal due to an excessive volume of blood received in culture bottles   Culture   Final    NO GROWTH 5 DAYS Performed at [facility], [address] [place], [place] [address]    Report Status 05/13/2020 FINAL  Final  Culture, blood (routine x 2)     Status: None   Collection Time: 05/08/20 12:16  AM   Specimen: BLOOD  Result Value Ref Range Status   Specimen Description BLOOD RIGHT ARM  Final   Special Requests   Final    BOTTLES DRAWN AEROBIC AND ANAEROBIC Blood Culture results may not be optimal due to an excessive volume of blood received in culture bottles   Culture   Final    NO GROWTH 5 DAYS Performed at [facility], [address] [place], [place] [address]    Report Status 05/13/2020 FINAL  Final  Surgical pcr screen     Status: Abnormal   Collection Time: 05/11/20 11:55 AM   Specimen: Nasal Mucosa; Nasal Swab  Result Value Ref Range Status   MRSA, PCR POSITIVE (A) NEGATIVE Final    Comment: RESULT CALLED TO, READ BACK BY AND VERIFIED WITH: RN A TEPE [id number]    Staphylococcus aureus POSITIVE (A) NEGATIVE Final    Comment: (NOTE) The Xpert SA Assay (FDA approved for NASAL specimens in patients 58 years of age and older), is one component of a comprehensive surveillance program. It is not intended to diagnose infection nor to guide or monitor treatment. Performed at [facility], [address]  [place], [place] [address]          Radiology Studies: DG CHEST PORT 1 VIEW  Result Date: 05/14/2020 CLINICAL DATA:  Status post VATS, dyspnea EXAM: PORTABLE CHEST 1 VIEW COMPARISON:  05/13/2020 FINDINGS: Right internal jugular central venous catheter with its tip overlying the superior right atrium and 3 right-sided large bore chest tubes are unchanged in position. There is persistent, stable right-sided volume loss with extensive consolidation throughout the right lung, likely infectious, inflammatory, or postsurgical in nature. Consolidation has progressed slightly in the interval since prior examination. Patchy infiltrate has progressed peripherally within the left lung base. No pneumothorax. Cardiac size within normal limits. IMPRESSION: Persistent right-sided volume loss with increasing consolidation within the right lung, likely infectious or inflammatory in nature. Right chest tubes in place. No pneumothorax or definite pleural effusion identified. Increasing patchy infiltrate within the left lung base, likely infectious or inflammatory. Electronically Signed   By: Helyn Numbers MD   On: 05/14/2020 06:26   VAS [place] LOWER EXTREMITY VENOUS (DVT)  Result Date: 05/14/2020  Lower Venous DVT Study Indications: Assess for residual clot. Other Indications: Per pt, no Hx of DVT- unable to see any prior studies                    suggesting DVT. Risk Factors: Recent?. Performing Technologist: Levada Schilling RDMS, RVT  Examination Guidelines: A complete evaluation includes B-mode imaging, spectral Doppler, color Doppler, and power Doppler as needed of all accessible portions of each vessel. Bilateral testing is considered an integral part of a complete examination. Limited examinations for reoccurring indications may be performed as noted. The reflux portion of the exam is performed with the patient in reverse Trendelenburg.  +---------+---------------+---------+-----------+----------+--------------+  RIGHT      Compressibility Phasicity Spontaneity Properties Thrombus Aging  +---------+---------------+---------+-----------+----------+--------------+  CFV       Full            Yes       Yes                                    +---------+---------------+---------+-----------+----------+--------------+  SFJ       Full                                                             +---------+---------------+---------+-----------+----------+--------------+  FV Prox   Full                                                             +---------+---------------+---------+-----------+----------+--------------+  FV Mid    Full                                                             +---------+---------------+---------+-----------+----------+--------------+  FV Distal Full                                                             +---------+---------------+---------+-----------+----------+--------------+  PFV       Full                                                             +---------+---------------+---------+-----------+----------+--------------+  POP       Full            Yes       Yes                                    +---------+---------------+---------+-----------+----------+--------------+  PTV       Full                                                             +---------+---------------+---------+-----------+----------+--------------+  PERO      Full                                                             +---------+---------------+---------+-----------+----------+--------------+  GSV       Full                                                             +---------+---------------+---------+-----------+----------+--------------+   +---------+---------------+---------+-----------+----------+--------------+  LEFT      Compressibility Phasicity Spontaneity Properties Thrombus Aging  +---------+---------------+---------+-----------+----------+--------------+  CFV       Full            Yes       Yes                                     +---------+---------------+---------+-----------+----------+--------------+  SFJ       Full                                                             +---------+---------------+---------+-----------+----------+--------------+  FV Prox   Full                                                             +---------+---------------+---------+-----------+----------+--------------+  FV Mid    Full                                                             +---------+---------------+---------+-----------+----------+--------------+  FV Distal Full                                                             +---------+---------------+---------+-----------+----------+--------------+  PFV       Full                                                             +---------+---------------+---------+-----------+----------+--------------+  POP       Full            Yes       Yes                                    +---------+---------------+---------+-----------+----------+--------------+  PTV       Full                                                             +---------+---------------+---------+-----------+----------+--------------+  PERO      Full                                                             +---------+---------------+---------+-----------+----------+--------------+  GSV       Full                                                             +---------+---------------+---------+-----------+----------+--------------+  Summary: RIGHT: - There is no evidence of deep vein thrombosis in the lower extremity.  - No cystic structure found in the popliteal fossa.  LEFT: - There is no evidence of deep vein thrombosis in the lower extremity.  - No cystic structure found in the popliteal fossa.  *See table(s) above for measurements and observations. Electronically signed by Waverly Ferrari MD on 05/14/2020 at 2:06:52 PM.    Final         Scheduled Meds:  acetaminophen  1,000 mg Oral Q6H    Or   acetaminophen (TYLENOL) oral liquid 160 mg/5 mL  1,000 mg Oral Q6H   atorvastatin  40 mg Oral Daily   bisacodyl  10 mg Oral Daily   Chlorhexidine Gluconate Cloth  6 each Topical Daily   cholecalciferol  1,000 Units Oral Daily   clonazePAM  0.5 mg Oral BID   fenofibrate  160 mg Oral Daily   furosemide  40 mg Intravenous BID   guaiFENesin  600 mg Oral BID   insulin aspart  5 Units Subcutaneous TID WC   insulin glargine  20 Units Subcutaneous QHS   levalbuterol  0.63 mg Nebulization Q6H   lidocaine  1 patch Transdermal Q24H   morphine   Intravenous Q4H   multivitamin with minerals  1 tablet Oral Daily   mupirocin ointment  1 application Nasal BID   nystatin   Topical BID   pregabalin  150 mg Oral TID   Ensure Max Protein  11 oz Oral BID   senna-docusate  1 tablet Oral QHS   umeclidinium bromide  1 puff Inhalation Daily   Continuous Infusions:  ampicillin-sulbactam (UNASYN) IV 3 g (05/15/20 0519)   heparin 2,100 Units/hr (05/15/20 0420)     LOS: 8 days    Time spent:40 min    Dameion Briles, Roselind Messier, MD [facility] Pager [phone]  If 7PM-7AM, please contact night-coverage [email or web address] Password [email or web address] 05/15/2020, 8:05 AM

## 2020-05-15 NOTE — Progress Notes (Signed)
Wasted 0.5mg  of morphine from PCA syringe.

## 2020-05-15 NOTE — Progress Notes (Signed)
ANTICOAGULATION CONSULT NOTE  Pharmacy Consult for Heparin  Indication: pulmonary embolus  Allergies  Allergen Reactions  . Cymbalta [Duloxetine Hcl]     Psychosis "self-harm"   Patient Measurements: Height: 5\' 8"  (172.7 cm) Weight: 112.4 kg (247 lb 12.8 oz) IBW/kg (Calculated) : 63.9  Heparin dosing wt: 90 kg  Vital Signs: Temp: 98.2 F (36.8 C) (12/01 1536) Temp Source: Oral (12/01 1536) BP: 103/66 (12/01 1536) Pulse Rate: 100 (12/01 1536)  Assessment: 59 y/o F transfer from [facility]. Pt has PE and had been on Eliquis recently (last dose not clear) and was transferred on heparin. Pt is s/p chest tube placement and IV heparin was resumed for PE on 11/24. On 11/26, Hgb dropped from 11 to 7.7. CTA showed new right-sided hemothorax. Heparin was subsequently held. Restarted on 11/30.  Heparin level remains subtherapeutic (0.21) on gtt at 2400 units/hr.  Goal of Therapy:  Heparin level 0.3-0.7 units/ml  Monitor platelets by anticoagulation protocol: Yes   Plan:  Increase heparin to 2550 units/hr - will not give bolus with recent hemothorax. Check 6 hour heparin level  Monitor daily HL, CBC, and for s/sx of bleeding  12/30, PharmD, BCPS, BCCCP Clinical Pharmacist [phone]  Please check AMION for all [facility] Pharmacy numbers  05/15/2020 6:48 PM

## 2020-05-15 NOTE — Progress Notes (Signed)
Nutrition Follow-up  DOCUMENTATION CODES:   Obesity unspecified  INTERVENTION:    Magic cup BID with meals, each supplement provides 290 kcal and 9 grams of protein  30 ml ProSource Plus BID, each supplement provides 100 kcals and 15 grams protein.   MVI with minerals daily  NUTRITION DIAGNOSIS:   Increased nutrient needs related to wound healing, acute illness (recent COVID PNA) as evidenced by estimated needs.  Ongoing  GOAL:   Patient will meet greater than or equal to 90% of their needs   Progressing  MONITOR:   PO intake, Supplement acceptance, Skin  REASON FOR ASSESSMENT:   Malnutrition Screening Tool    ASSESSMENT:   59 yo female admitted from [facility] for treatment of a complicated pneumothorax in the setting of MRSA PNA. PMH includes COVID PNA 2 months PTA, recent PE, COPD, HTN, DM, asthma.   11/28- VATS, thoracotomy R pleurodesis, evacuation hematoma  On 4L HFNC. Appetite improved significantly. Meal completions charted as 100% for her last three meals. Does not like Ensure MAX. Willing to try prosource and magic cups for extra protein.   Admission weight: 117.9 kg  Current weight: 112.4 kg   UOP: 3425 ml x 24 hrs  Chest tubes: 180 ml x 24 hrs   Medications: dulcolax, 40 mg lasix BID, SS novolog, lantus, senokot Labs: Mg 1.6 (L) CBG [id number]  Diet Order:   Diet Order            Diet Carb Modified Fluid consistency: Thin; Room service appropriate? Yes  Diet effective now                 EDUCATION NEEDS:   Not appropriate for education at this time  Skin:  Skin Assessment: Skin Integrity Issues: Skin Integrity Issues:: Stage II Stage II: coccyx  Last BM:  11/30  Height:   Ht Readings from Last 1 Encounters:  05/07/20 5\' 8"  (1.727 m)    Weight:   Wt Readings from Last 1 Encounters:  05/15/20 112.4 kg    Ideal Body Weight:  63.6 kg  BMI:  Body mass index is 37.68 kg/m.  Estimated Nutritional Needs:    Kcal:  2300-2500  Protein:  125-150 gm  Fluid:  >/= 2 L  14/01/21 RD, LDN Clinical Nutrition Pager listed in AMION

## 2020-05-15 NOTE — Progress Notes (Signed)
Inpatient Diabetes Program Recommendations  AACE/ADA: New Consensus Statement on Inpatient Glycemic Control (2015)  Target Ranges:  Prepandial:   less than 140 mg/dL      Peak postprandial:   less than 180 mg/dL (1-2 hours)      Critically ill patients:  140 - 180 mg/dL   Lab Results  Component Value Date   GLUCAP 161 (H) 05/15/2020   HGBA1C 10.7 (H) 05/08/2020    Review of Glycemic Control Results for NILSA, MACHT (MRN [id number]) as of 05/15/2020 11:44  Ref. Range 05/14/2020 10:57 05/14/2020 16:19 05/14/2020 21:26 05/15/2020 06:31 05/15/2020 11:10  Glucose-Capillary Latest Ref Range: 70 - 99 mg/dL 009 (H) 381 (H) 829 (H) 164 (H) 161 (H)   Current orders for Inpatient glycemic control:  Lantus 20 units QHS Novolog 5 units tid  Inpatient Diabetes Program Recommendations:     Noted Novolog 0-24 units Q4H was discontinued.  Please consider Novolog 0-15 units tid with meals.  Will continue to follow while inpatient.  Thank you, Dulce Sellar, RN, BSN Diabetes Coordinator Inpatient Diabetes Program [phone] (team pager from 8a-5p)

## 2020-05-15 NOTE — Progress Notes (Signed)
ANTICOAGULATION CONSULT NOTE  Pharmacy Consult for Heparin  Indication: pulmonary embolus  Allergies  Allergen Reactions  . Cymbalta [Duloxetine Hcl]     Psychosis "self-harm"   Patient Measurements: Height: 5\' 8"  (172.7 cm) Weight: 112.4 kg (247 lb 12.8 oz) IBW/kg (Calculated) : 63.9  Heparin dosing wt: 90 kg  Vital Signs: Temp: 98.3 F (36.8 C) (12/01 0841) Temp Source: Oral (12/01 1111) BP: 103/65 (12/01 1111) Pulse Rate: 96 (12/01 1111)  Assessment: 59 y/o F transfer from [facility]. Pt has PE and had been on Eliquis recently (last dose not clear) and was transferred on heparin. Pt is s/p chest tube placement and IV heparin was resumed for PE on 11/24. On 11/26, Hgb dropped from 11 to 7.7. CTA showed new right-sided hemothorax. Heparin was subsequently held. Restarted on 11/30.  Heparin level remains subtherapeutic (0.16) on gtt at 2100 units/hr. Hgb 8.9, plt 87 (trending down). No s/sx of bleeding or infusion issues.   Goal of Therapy:  Heparin level 0.3-0.7 units/ml  Monitor platelets by anticoagulation protocol: Yes   Plan:  Increase heparin to 2400 units/hr - will not give bolus with recent hemothorax. Check 6 hour heparin level  Monitor daily HL, CBC, and for s/sx of bleeding  12/30, PharmD, BCCCP Clinical Pharmacist  Phone: [phone] 05/15/2020 11:40 AM  Please check AMION for all [facility] Pharmacy phone numbers After 10:00 PM, call Main Pharmacy [phone]

## 2020-05-15 NOTE — Plan of Care (Signed)

## 2020-05-15 NOTE — Progress Notes (Signed)
[address]       [place] [address]             [phone]      3 Days Post-Op Procedure(s) (LRB): VIDEO ASSISTED THORACOSCOPY (VATS)/THOROCOTOMY MECCHANICAL PLEURO DESIS (Right) EVACUATION HEMATOMA (Right) Subjective:  Transferred to 4E yesterday.  Says she feels she is making progress. Working on incentive spirometry every hour and has a flutter valve available as well.  On 4Lnc  With adequate O2 sats.   Objective: Vital signs in last 24 hours: Temp:  [97.8 F (36.6 C)-98.6 F (37 C)] 98.3 F (36.8 C) (12/01 0841) Pulse Rate:  [92-106] 96 (12/01 1111) Cardiac Rhythm: Sinus tachycardia (12/01 0841) Resp:  [16-20] 18 (12/01 1142) BP: (99-129)/(63-74) 103/65 (12/01 1111) SpO2:  [88 %-100 %] 100 % (12/01 1142) FiO2 (%):  [41 %] 41 % (11/30 1743) Weight:  [112.4 kg] 112.4 kg (12/01 0446)    Intake/Output from previous day: 11/30 0701 - 12/01 0700 In: 950.5 [P.O.:480; I.V.:296.4; IV Piggyback:174.1] Out: 3605 [Urine:3425; Chest Tube:180] Intake/Output this shift: Total I/O In: 240 [P.O.:240] Out: 1140 [Urine:1100; Chest Tube:40]  General appearance: alert, cooperative and no distress Neurologic: intact Heart: regular rate and rhythm Lungs: coarse crackles bilat, moving some air on the right but breath sounds remain diminished. CT drainage past 24 hours. Drainage is thin, serosanguinous fluid. No air leak. CXR reviewed, report below. Slight improvement in right side aeration.  Wound: Continues to have extensive bruising to right chest but no extension. The wound vac is in place and functioning appropriately.   Lab Results: Recent Labs    05/14/20 1320 05/15/20 0202  WBC 6.5 4.5  HGB 8.3* 8.9*  HCT 27.6* 28.9*  PLT 92* 87*   BMET:  Recent Labs    05/14/20 0415 05/15/20 0202  NA 132* 137  K 4.2 4.6  CL 96* 94*  CO2 33* 34*  GLUCOSE 181* 220*  BUN 12 14  CREATININE 0.58 0.62  CALCIUM 7.6* 8.1*    PT/INR: No results for  input(s): LABPROT, INR in the last 72 hours. ABG    Component Value Date/Time   PHART 7.400 05/12/2020 1524   HCO3 36.1 (H) 05/12/2020 1524   TCO2 38 (H) 05/12/2020 1524   O2SAT 99.0 05/12/2020 1524   CBG (last 3)  Recent Labs    05/14/20 2126 05/15/20 0631 05/15/20 1110  GLUCAP 371* 164* 161*   EXAM: PORTABLE CHEST 1 VIEW  COMPARISON:  May 14, 2020  FINDINGS: Central catheter has been removed. There are 3 chest tubes on the right, unchanged in position. No appreciable pneumothorax. Extensive airspace consolidation throughout much of the right lung, particularly in the right upper lobe, is stable. There is stable atelectasis in the left base. No new opacity evident. Heart size and pulmonary vascularity are within normal limits. No adenopathy evident. No bone lesions.  IMPRESSION: Extensive airspace consolidation throughout much of the right lung, stable. Left base atelectasis. No new opacity appreciable. Chest tubes remain on the right, unchanged in position, without pneumothorax. Stable cardiac silhouette.   Electronically Signed   By: Bretta Bang III M.D.   On: 05/15/2020 08:21  Assessment/Plan: S/P Procedure(s) (LRB): VIDEO ASSISTED THORACOSCOPY (VATS)/THOROCOTOMY MECCHANICAL PLEURO DESIS (Right) EVACUATION HEMATOMA (Right)  -POD-3 Right VATS for mechanical pleurodesis, evacuation of chest wall hematoma following CT placement at OSH.  Has persistent right-side consolidation. ABX per primary team and needs continued encouragement to work on pulmonary hygiene.  Can probably  start debriding some of the right chest drains. Plan vac change for tomorrow or Friday.    LOS: 8 days    Leary Roca, [place] [phone] 05/15/2020

## 2020-05-15 NOTE — Progress Notes (Addendum)
Physical Therapy Treatment Patient Details Name: Annette [name] MRN: [id number] DOB: 09/04/1960 Today's Date: 05/15/2020    History of Present Illness 59 yo female admitted to [facility] on 05/03/2020 with R upper lobe MRSA pneumonia and R lower lobe subsegmental PE. Developed right-sided hydropneumothorax on 11/22 underwent chest tube insertion and on 05/06/2020 transfered to [facility] for CTS evaluation and treatment. Additional R chest tube placement 11/24.  On 11/28, patient underwent VATS/thoracotomy mechanical pleurodesis and wound VAC placement. PMH including Covid pneumonia approximately 2 months ago, recent PE on Eliquis, COPD, asthma, depression, hypertension, and diabetes.    PT Comments    Pt supine in bed on arrival.  Pt tolerated session well.  Pt limited to [facility] and physical limitations at baseline.  Pt required cues for pursed lip breathing and performed incentive spirometer.  Pt continues to benefit from skilled rehab in the home setting to improve strength and function and to decrease caregiver burden.     Follow Up Recommendations  Home health PT;Supervision/Assistance - 24 hour     Equipment Recommendations  3in1 (PT)    Recommendations for Other Services       Precautions / Restrictions Precautions Precautions: Fall Precaution Comments: Chest tube, VAC Restrictions Weight Bearing Restrictions: No    Mobility  Bed Mobility Overal bed mobility: Needs Assistance Bed Mobility: Supine to Sit;Sit to Supine     Supine to sit: Min assist Sit to supine: Mod assist   General bed mobility comments: Pt required assistance to move LEs to edge of bed and elevate trunk into a seated position.  Pt required mod assistance to move LEs back to bed against gravity  Transfers Overall transfer level: Needs assistance Equipment used: Rolling walker (2 wheeled) Transfers: Sit to/from Stand Sit to Stand: Min assist         General transfer comment:  Crepitus noted in R knee.  Cues for hand placement to push from seated surface.  Pt unable to achieve full standing at baseline so flexed over RW with elbows resting on hand grips. Performed x 5 reps for repetition and strengthening.  Ambulation/Gait Ambulation/Gait assistance:  (Unable)               Stairs             Wheelchair Mobility    Modified Rankin (Stroke Patients Only)       Balance Overall balance assessment: Needs assistance   Sitting balance-Leahy Scale: Fair Sitting balance - Comments: UE support to balance                                    Cognition Arousal/Alertness: Awake/alert Behavior During Therapy: WFL for tasks assessed/performed Overall Cognitive Status: Within Functional Limits for tasks assessed                                        Exercises General Exercises - Lower Extremity Ankle Circles/Pumps: AROM;Both;20 reps;Seated Long Arc Quad: AROM;Both;10 reps;Seated Other Exercises Other Exercises: IS x 10 reps: quality (500-875 ml)    General Comments        Pertinent Vitals/Pain Pain Assessment: Faces Faces Pain Scale: Hurts even more Pain Location: neuropathy, back, and R chest tube site Pain Descriptors / Indicators: Aching;Discomfort;Grimacing;Guarding Pain Intervention(s): Monitored during session;Repositioned    Home Living  Prior Function            PT Goals (current goals can now be found in the care plan section) Acute Rehab PT Goals Patient Stated Goal: to go home Potential to Achieve Goals: Good Progress towards PT goals: Progressing toward goals    Frequency    Min 3X/week      PT Plan Current plan remains appropriate    Co-evaluation              AM-PAC PT "6 Clicks" Mobility   Outcome Measure  Help needed turning from your back to your side while in a flat bed without using bedrails?: A Little Help needed moving from lying on  your back to sitting on the side of a flat bed without using bedrails?: A Little Help needed moving to and from a bed to a chair (including a wheelchair)?: A Little Help needed standing up from a chair using your arms (e.g., wheelchair or bedside chair)?: A Little Help needed to walk in hospital room?: A Little Help needed climbing 3-5 steps with a railing? : A Little 6 Click Score: 18    End of Session Equipment Utilized During Treatment: Oxygen Activity Tolerance: Patient limited by fatigue Patient left: in chair;with call bell/phone within reach;with chair alarm set Nurse Communication: Mobility status PT Visit Diagnosis: Muscle weakness (generalized) (M62.81)     Time: 1937-9024 PT Time Calculation (min) (ACUTE ONLY): 23 min  Charges:  $Therapeutic Exercise: 8-22 mins $Therapeutic Activity: 8-22 mins                     Bonney Leitz , PTA Acute Rehabilitation Services Pager [phone] Office [phone]     [name] Artis Delay 05/15/2020, 10:11 AM

## 2020-05-15 NOTE — Progress Notes (Addendum)
Morphine syringe replaced, 12 mg morphine cleared from pump with old syringe. 1 cc morphine wasted with Darryl RN. Will continue to monitor.

## 2020-05-15 NOTE — Progress Notes (Signed)
ANTICOAGULATION CONSULT NOTE  Pharmacy Consult for Heparin  Indication: pulmonary embolus  Allergies  Allergen Reactions  . Cymbalta [Duloxetine Hcl]     Psychosis "self-harm"   Patient Measurements: Height: 5\' 8"  (172.7 cm) Weight: 111.7 kg (246 lb 4.1 oz) IBW/kg (Calculated) : 63.9  Heparin dosing wt: 90 kg  Vital Signs: Temp: 98.6 F (37 C) (12/01 0025) Temp Source: Oral (12/01 0025) BP: 99/63 (12/01 0025) Pulse Rate: 92 (12/01 0025)  Assessment: 59 y/o F transfer from [facility]. Pt has PE and had been on Eliquis recently (last dose not clear) and was transferred on heparin. Pt is s/p chest tube placement and IV heparin was resumed for PE on 11/24. On 11/26, Hgb dropped from 11 to 7.7. CTA showed new right-sided hemothorax. Heparin was subsequently held. Restarted on 11/30.  Heparin level remains subtherapeutic (0.13) on gtt at 1800 units/hr. No issues with line or bleeding reported per RN. Hgb stable at 8.9, plt down to 87.  Goal of Therapy:  Heparin level 0.3-0.7 units/ml  Monitor platelets by anticoagulation protocol: Yes   Plan:  Increase heparin to 2100 units/hr - will not give bolus with recent hemothorax. Check 6 hour heparin level   12/30, PharmD, BCPS Please see amion for complete clinical pharmacist phone list 05/15/2020, 3:45 AM

## 2020-05-16 ENCOUNTER — Inpatient Hospital Stay (HOSPITAL_COMMUNITY): Payer: Medicare Other

## 2020-05-16 DIAGNOSIS — J15212 Pneumonia due to Methicillin resistant Staphylococcus aureus: Secondary | ICD-10-CM | POA: Diagnosis not present

## 2020-05-16 DIAGNOSIS — J90 Pleural effusion, not elsewhere classified: Secondary | ICD-10-CM | POA: Diagnosis not present

## 2020-05-16 DIAGNOSIS — J431 Panlobular emphysema: Secondary | ICD-10-CM | POA: Diagnosis not present

## 2020-05-16 DIAGNOSIS — J9601 Acute respiratory failure with hypoxia: Secondary | ICD-10-CM | POA: Diagnosis not present

## 2020-05-16 LAB — CBC WITH DIFFERENTIAL/PLATELET
Abs Immature Granulocytes: 0.02 10*3/uL (ref 0.00–0.07)
Basophils Absolute: 0 10*3/uL (ref 0.0–0.1)
Basophils Relative: 0 %
Eosinophils Absolute: 0.1 10*3/uL (ref 0.0–0.5)
Eosinophils Relative: 2 %
HCT: 29.2 % — ABNORMAL LOW (ref 36.0–46.0)
Hemoglobin: 9.1 g/dL — ABNORMAL LOW (ref 12.0–15.0)
Immature Granulocytes: 1 %
Lymphocytes Relative: 23 %
Lymphs Abs: 0.8 10*3/uL (ref 0.7–4.0)
MCH: 29 pg (ref 26.0–34.0)
MCHC: 31.2 g/dL (ref 30.0–36.0)
MCV: 93 fL (ref 80.0–100.0)
Monocytes Absolute: 0.2 10*3/uL (ref 0.1–1.0)
Monocytes Relative: 6 %
Neutro Abs: 2.5 10*3/uL (ref 1.7–7.7)
Neutrophils Relative %: 68 %
Platelets: 79 10*3/uL — ABNORMAL LOW (ref 150–400)
RBC: 3.14 MIL/uL — ABNORMAL LOW (ref 3.87–5.11)
RDW: 16.4 % — ABNORMAL HIGH (ref 11.5–15.5)
WBC: 3.7 10*3/uL — ABNORMAL LOW (ref 4.0–10.5)
nRBC: 0 % (ref 0.0–0.2)

## 2020-05-16 LAB — MAGNESIUM: Magnesium: 1.9 mg/dL (ref 1.7–2.4)

## 2020-05-16 LAB — GLUCOSE, CAPILLARY
Glucose-Capillary: 196 mg/dL — ABNORMAL HIGH (ref 70–99)
Glucose-Capillary: 202 mg/dL — ABNORMAL HIGH (ref 70–99)
Glucose-Capillary: 286 mg/dL — ABNORMAL HIGH (ref 70–99)
Glucose-Capillary: 360 mg/dL — ABNORMAL HIGH (ref 70–99)
Glucose-Capillary: 336 mg/dL — ABNORMAL HIGH (ref 70–99)

## 2020-05-16 LAB — COMPREHENSIVE METABOLIC PANEL
ALT: 14 U/L (ref 0–44)
AST: 13 U/L — ABNORMAL LOW (ref 15–41)
Albumin: 1.6 g/dL — ABNORMAL LOW (ref 3.5–5.0)
Alkaline Phosphatase: 53 U/L (ref 38–126)
Anion gap: 10 (ref 5–15)
BUN: 13 mg/dL (ref 6–20)
CO2: 38 mmol/L — ABNORMAL HIGH (ref 22–32)
Calcium: 8.3 mg/dL — ABNORMAL LOW (ref 8.9–10.3)
Chloride: 93 mmol/L — ABNORMAL LOW (ref 98–111)
Creatinine, Ser: 0.66 mg/dL (ref 0.44–1.00)
GFR, Estimated: >60 mL/min (ref >60)
Glucose, Bld: 254 mg/dL — ABNORMAL HIGH (ref 70–99)
Potassium: 4.1 mmol/L (ref 3.5–5.1)
Sodium: 141 mmol/L (ref 135–145)
Total Bilirubin: 0.5 mg/dL (ref 0.3–1.2)
Total Protein: 4.7 g/dL — ABNORMAL LOW (ref 6.5–8.1)

## 2020-05-16 LAB — HEPARIN LEVEL (UNFRACTIONATED)
Heparin Unfractionated: 0.46 IU/mL (ref 0.30–0.70)
Heparin Unfractionated: 0.41 IU/mL (ref 0.30–0.70)

## 2020-05-16 LAB — PHOSPHORUS: Phosphorus: 2.6 mg/dL (ref 2.5–4.6)

## 2020-05-16 MED ORDER — GERHARDT'S BUTT CREAM
TOPICAL_CREAM | Freq: Two times a day (BID) | CUTANEOUS | Status: DC
  Administered 2020-05-18 – 2020-06-08 (×4): 1 via TOPICAL
  Filled 2020-05-16 (×2): qty 1

## 2020-05-16 MED ORDER — INSULIN ASPART 100 UNIT/ML ~~LOC~~ SOLN
5.0000 [IU] | Freq: Once | SUBCUTANEOUS | Status: AC
  Administered 2020-05-16: 5 [IU] via SUBCUTANEOUS

## 2020-05-16 NOTE — Progress Notes (Addendum)
      [address]       [place]nette [name] [address]             [phone]      4 Days Post-Op Procedure(s) (LRB): VIDEO ASSISTED THORACOSCOPY (VATS)/THOROCOTOMY MECCHANICAL PLEURO DESIS (Right) EVACUATION HEMATOMA (Right) Subjective:  Says she feels she is making progress. On 4Lnc with adequate O2 sats.   Objective: Vital signs in last 24 hours: Temp:  [98.1 F (36.7 C)-98.6 F (37 C)] 98.6 F (37 C) (12/02 0358) Pulse Rate:  [94-100] 97 (12/02 0358) Cardiac Rhythm: Normal sinus rhythm (12/02 0732) Resp:  [16-20] 19 (12/02 0400) BP: (103-115)/(64-73) 115/68 (12/02 0358) SpO2:  [94 %-100 %] 100 % (12/02 0400) Weight:  [112.8 kg] 112.8 kg (12/02 0358)    Intake/Output from previous day: 12/01 0701 - 12/02 0700 In: 1307.8 [P.O.:840; I.V.:267.8; IV Piggyback:200] Out: 4115 [Urine:4000; Drains:25; Chest Tube:90] Intake/Output this shift: No intake/output data recorded.  General appearance: alert, cooperative and no distress Neurologic: intact Heart: regular rate and rhythm Lungs: coarse crackles bilat,  breath sounds remain diminished on the right. CT drainage past 24 hours. Drainage is thin, serosanguinous fluid. No air leak. CXR pending. Wound: Continues to have extensive bruising to right chest but this has not extended.  The wound vac is in place and functioning appropriately.   Lab Results: Recent Labs    05/15/20 0202 05/16/20 0148  WBC 4.5 3.7*  HGB 8.9* 9.1*  HCT 28.9* 29.2*  PLT 87* 79*   BMET:  Recent Labs    05/15/20 0202 05/16/20 0148  NA 137 141  K 4.6 4.1  CL 94* 93*  CO2 34* 38*  GLUCOSE 220* 254*  BUN 14 13  CREATININE 0.62 0.66  CALCIUM 8.1* 8.3*    PT/INR: No results for input(s): LABPROT, INR in the last 72 hours. ABG    Component Value Date/Time   PHART 7.400 05/12/2020 1524   HCO3 36.1 (H) 05/12/2020 1524   TCO2 38 (H) 05/12/2020 1524   O2SAT 99.0 05/12/2020 1524   CBG (last 3)  Recent Labs    05/15/20 1534  05/15/20 2100 05/16/20 0620  GLUCAP 177* 271* 196*    Assessment/Plan: S/P Procedure(s) (LRB): VIDEO ASSISTED THORACOSCOPY (VATS)/THOROCOTOMY MECCHANICAL PLEURO DESIS (Right) EVACUATION HEMATOMA (Right)  -POD-4 Right VATS for mechanical pleurodesis, evacuation of chest wall hematoma following CT placement at OSH.  Has persistent right-side consolidation. ABX per primary team and needs continued encouragement to work on pulmonary hygiene.  Will start debriding some of the right chest drains. Plan vac change for later today or tomorrow.    LOS: 9 days    Annette [name], [place] [phone] 05/16/2020   Addendum: 09:46am Discussed with Dr. Vickey Sages. Considering right chest soft tissue wound closure in the OR tomorrow. Will hold off on vac change today.  Discussed the plan with Annette [name] and she agrees and would like to proceed.   Gaynelle Arabian, PA-C

## 2020-05-16 NOTE — Care Management Important Message (Signed)
Important Message  Patient Details  Name: Annette [name] MRN: [id number] Date of Birth: 09/22/1960   Medicare Important Message Given:  Yes  Pt. on precautions,gave IM letter to nurse.     Sloan Leiter Smith 05/16/2020, 11:21 AM

## 2020-05-16 NOTE — Progress Notes (Addendum)
ANTICOAGULATION CONSULT NOTE  Pharmacy Consult for Heparin  Indication: pulmonary embolus  Allergies  Allergen Reactions  . Cymbalta [Duloxetine Hcl]     Psychosis "self-harm"   Patient Measurements: Height: 5\' 8"  (172.7 cm) Weight: 112.8 kg (248 lb 10.9 oz) IBW/kg (Calculated) : 63.9  Heparin dosing wt: 90 kg  Vital Signs: Temp: 98.6 F (37 C) (12/02 0358) Temp Source: Oral (12/02 0358) BP: 115/68 (12/02 0358) Pulse Rate: 97 (12/02 0358)  Assessment: 59 y/o F transfer from [facility]. Pt has PE and had been on Eliquis recently (last dose not clear) and was transferred on heparin. Pt is s/p chest tube placement and IV heparin was resumed for PE on 11/24. On 11/26, Hgb dropped from 11 to 7.7. CTA showed new right-sided hemothorax. Heparin was subsequently held. Restarted on 11/30.  Heparin level remains therapeutic at 0.46, on 2550 units/hr. Hgb 9.1, plt 79 (trending down). No s/sx of bleeding or infusion issues.   Goal of Therapy:  Heparin level 0.3-0.7 units/ml  Monitor platelets by anticoagulation protocol: Yes   Plan:  Continue heparin infusion at 2550 units/hr Will confirm heparin level 6 hr from last level Monitor daily HL, CBC, and for s/sx of bleeding  12/30, PharmD, BCCCP Clinical Pharmacist  Phone: [phone] 05/16/2020 8:12 AM  Please check AMION for all [facility] Pharmacy phone numbers After 10:00 PM, call Main Pharmacy [phone]  [facility] Plan for R chest soft tissue wound closure tomorrow. Heparin level came back therapeutic at 0.41, on 2550 units/hr. No s/sx of bleeding or infusion issues per nursing. Will continue at same rate and get levels with AM labs.   [facility], PharmD, BCCCP Clinical Pharmacist

## 2020-05-16 NOTE — Progress Notes (Signed)
Educated patient on MRSA and PNA and the reasoning for the isolation. Pt understands and questions were answered.

## 2020-05-16 NOTE — Plan of Care (Signed)
  Problem: Education: Goal: Knowledge of General Education information will improve Description Including pain rating scale, medication(s)/side effects and non-pharmacologic comfort measures Outcome: Progressing   Problem: Clinical Measurements: Goal: Ability to maintain clinical measurements within normal limits will improve Outcome: Progressing Goal: Will remain free from infection Outcome: Progressing Goal: Diagnostic test results will improve Outcome: Progressing Goal: Respiratory complications will improve Outcome: Progressing Goal: Cardiovascular complication will be avoided Outcome: Progressing   Problem: Elimination: Goal: Will not experience complications related to bowel motility Outcome: Progressing Goal: Will not experience complications related to urinary retention Outcome: Progressing   Problem: Pain Managment: Goal: General experience of comfort will improve Outcome: Progressing   Problem: Safety: Goal: Ability to remain free from injury will improve Outcome: Progressing   Problem: Skin Integrity: Goal: Risk for impaired skin integrity will decrease Outcome: Progressing   

## 2020-05-16 NOTE — Progress Notes (Signed)
Inpatient Diabetes Program Recommendations  AACE/ADA: New Consensus Statement on Inpatient Glycemic Control (2015)  Target Ranges:  Prepandial:   less than 140 mg/dL      Peak postprandial:   less than 180 mg/dL (1-2 hours)      Critically ill patients:  140 - 180 mg/dL   Lab Results  Component Value Date   GLUCAP 196 (H) 05/16/2020   HGBA1C 10.7 (H) 05/08/2020    Review of Glycemic Control Results for GARNETT, REKOWSKI (MRN [id number]) as of 05/16/2020 11:25  Ref. Range 05/15/2020 06:31 05/15/2020 11:10 05/15/2020 15:34 05/15/2020 21:00 05/16/2020 06:20  Glucose-Capillary Latest Ref Range: 70 - 99 mg/dL 937 (H) 342 (H) 876 (H) 271 (H) 196 (H)   Current orders for Inpatient glycemic control:  Lantus 20 units QHS Novolog 5 units tid  Inpatient Diabetes Program Recommendations:     Please consider Novolog 0-9 units tid and 0-5 qhs  Will continue to follow while inpatient.  Thank you, Dulce Sellar, RN, BSN Diabetes Coordinator Inpatient Diabetes Program [phone] (team pager from 8a-5p)

## 2020-05-16 NOTE — Progress Notes (Signed)
PROGRESS NOTE    Annette [name]  PJA:[id number] DOB: 18-May-1961 DOA: 05/07/2020 PCP: Patient, No Pcp Per     Brief Narrative:  59 yr WF with recent COVID-19 about 2 months ago, recent PE on Eliquis, COPD/Asthma not on oxygen, DM-2, HTN and HLD   Admitted to [facility] on 05/03/2020 where she was admitted for RUL MRSA pneumonia and RLL subsegmental PE (TTE with mod PAH but no right heart strain) treated with vancomycin, then Zyvox,  and Lovenox.  She developed right-sidedhydropneumothorax on 11/22 underwent chest tube insertion on 05/06/2020 and transferred to [facility] for CTS evaluation and treatment.   On arrival here, CT chest without contrast with moderately large right pneumothorax, subcutaneous emphysema throughout the right chest wall extending into right neck, pneumomediastinum, right upper lobe opacity and emphysema.  IR consulted by CTS and placed additional chest tube to right chest on 05/08/2020.    On 11/26, chest x-ray with near resolution of pneumothorax but persistent right lung opacity.  IV Unasyn added. Also had significant bleeding around chest tube.  Hemoglobin dropped 3 g.  Heparin held.  Transfused 2 units and stabilized.  CTA chest with new nonocclusive PE in RLL, persistent moderate right-sided pneumothorax with mediastinal shift to the left, new moderate sized right-sided pneumothorax, changed chest tube possibly causing pulmonary laceration and new large right flank hematoma measuring 12 cm without definite evidence of active extravasation, diffuse wall thickening of gallbladder, coarse airspace opacities and pulmonary fibrosis.   On 11/28, patient underwent VATS/thoracotomy mechanical pleurodesis and wound VAC placement, and sent to ICU out of concern about her respiratory status but remained stable 4 L oxygen by nasal cannula.  On 11/30, transferred out of ICU to progressive unit.  Heparin resumed.  Also started on IV Lasix by  CTS   Subjective: 12/2 afebrile overnight A/O x4, states has been using flutter valve overnight and it was effective.  Unfortunately fell off the table overnight and broke.   Assessment & Plan: Covid vaccination; no vaccination (patient had Covid October)   Principal Problem:   MRSA pneumonia (HCC) Active Problems:   Pulmonary embolism (HCC)   Pneumothorax   Acute hypoxemic respiratory failure (HCC)   COPD (chronic obstructive pulmonary disease) (HCC)   Pressure injury of skin   Right-sided pneumothorax with mediastinal shift: -S/p VATS/thoracotomy mechanical pleurodesis -Flutter valve -[name]  Large right flank hematoma measuring about 12 cm- -S/p evacuation and wound VAC  Acute blood loss anemia:  Lab Results  Component Value Date   HGB 9.1 (L) 05/16/2020   HGB 8.9 (L) 05/15/2020   HGB 8.3 (L) 05/14/2020   HGB 8.1 (L) 05/14/2020   HGB 8.6 (L) 05/13/2020  -11/27 transfuse 2 units PRBC -11/28 transfuse 2 units PRBC   Right upper lobe MRSA pneumonia/Aspiration pneumonia:  -Patient has completed antibiotics for MRSA pneumonia. -We will complete course of Unasyn on 12/3 for her aspiration pneumonia. -Negative fever, leukocytosis.  -12/1 PCXR; whole right lung opacified see results below -Continue increased respiratory demand.  Acute RLL subsegmental PE:  -TTE at OSH with moderate PAH but no right heart strain. -Resume heparin.  -11/30 LE venous Doppler.  Negative for DVT  Acute respiratory failure with hypoxia  -Likely due to the above.  Stable on 3 to 4 L by nasal cannula. -Wean oxygen as able.  Minimum oxygen to keep saturation above 88% -Encourage incentive spirometry and flutter valve -Mobilize  DM type II uncontrolled with hyperglycemia  -11/24 hemoglobin A1c= 10.7 -Continue SSI-moderate -12/2 increase  NovoLog 8units qac  -12/2 increase Lantus 20 units daily -Continue home statin.  Chronic COPD:  -Stable -On Xopenex and Incruse Ellipta  now  Hyperlipidemia -Continue Lipitor and fenofibrate  GERD -Continue PPI  Anxiety: Stable -Continue home Klonopin  Neuropathy/chronic pain -Continue home Lyrica  History of COVID-19 infection:  -Treated for this about 2 months ago.  Leukocytosis/bandemia:  -Likely due to #1.  Resolved. -Continue monitoring  Thrombocytopenia:  -Platelet 126>> 113> 144> 96.  Consumptive from blood clot? -Continue monitoring  Intertrigo/cutaneous candidiasis -Diflucan 150 mg every 72 hours x2 -Topical nystatin powder  Morbid obesity: Body mass index is 37.44 kg/m. Nutrition Problem: Increased nutrient needs Etiology: wound healing, acute illness (recent COVID PNA) Signs/Symptoms: estimated needs Interventions: MVI, Ensure Enlive (each supplement provides 350kcal and 20 grams of protein)   Stage II pressure skin injury: POA Pressure Injury 05/07/20 Coccyx Medial;Mid Stage 2 -  Partial thickness loss of dermis presenting as a shallow open injury with a red, pink wound bed without slough. (Active)  05/07/20 2100  Location: Coccyx  Location Orientation: Medial;Mid  Staging: Stage 2 -  Partial thickness loss of dermis presenting as a shallow open injury with a red, pink wound bed without slough.  Wound Description (Comments):   Present on Admission: Yes   Hypomagnesmia -Magnesium goal> 2 -Magnesium IV 2 g  Goals of care -Spoke at length with NCM Kristi patient is a frequent flyer here who is noncompliant refuses SNF.  Will place consult for palliative care -12/2 Palliative Care consult; patient who is noncompliant, frequent flyer, multisystem organ failure.  Evaluate DNR, discuss goals of care palliative vs hospice   DVT prophylaxis: Heparin subcu Code Status: Full Family Communication:  Status is: Inpatient    Dispo: The patient is from: Home              Anticipated d/c is to:??              Anticipated d/c date is: 12/9              Patient currently  unstable      Consultants:  IR Cardiothoracic surgery   Procedures/Significant Events:  05/06/2020-right chest tube placement at outside hospital 05/08/2020-second right chest tube placement by IR  05/12/2020-VATS/thoracotomy mechanical pleurodesis and wound VAC placement 11/30 bilateral lower extremity Doppler; negative DVT 12/1 PCXR-extensive airspace consolidation throughout much of the right lung, stable. -Left base atelectasis. No new opacity appreciable. Chest tubes remain on the right, unchanged in position, without pneumothorax. Stable cardiac silhouette.  I have personally reviewed and interpreted all radiology studies and my findings are as above.  VENTILATOR SETTINGS: Nasal cannula 12/2 Flow; 4 L/min SPO2 100%   Cultures Blood cultures NGTD. MRSA PCR positive.   Antimicrobials: Anti-infectives (From admission, onward)   Start     Ordered Stop   05/10/20 1300  fluconazole (DIFLUCAN) tablet 150 mg        05/10/20 1151 05/13/20 1408   05/10/20 0900  Ampicillin-Sulbactam (UNASYN) 3 g in sodium chloride 0.9 % 100 mL IVPB        05/10/20 0753 05/17/20 0859   05/08/20 0030  linezolid (ZYVOX) tablet 600 mg  Status:  Discontinued        05/08/20 0015 05/14/20 1113       Devices    LINES / TUBES:      Continuous Infusions: . ampicillin-sulbactam (UNASYN) IV 3 g (05/16/20 0358)  . heparin 2,550 Units/hr (05/16/20 0040)     Objective: Vitals:  05/16/20 0030 05/16/20 0040 05/16/20 0358 05/16/20 0400  BP:  114/73 115/68   Pulse:  94 97   Resp: Temp:  98.3 F (36.8 C) 98.6 F (37 C)   TempSrc:  Oral Oral   SpO2: 100% 100% 100% 100%  Weight:   112.8 kg   Height:        Intake/Output Summary (Last 24 hours) at 05/16/2020 0859 Last data filed at 05/16/2020 0400 Gross per 24 hour  Intake 1307.75 ml  Output 3915 ml  Net -2607.25 ml   Filed Weights   05/13/20 0600 05/15/20 0446 05/16/20 0358  Weight: 111.7 kg 112.4 kg 112.8 kg     Examination:  General: A/O x4, positive acute respiratory distress Eyes: negative scleral hemorrhage, negative anisocoria, negative icterus ENT: Negative Runny nose, negative gingival bleeding, Neck:  Negative scars, masses, torticollis, lymphadenopathy, JVD Lungs: decreased breath sounds bilateral RIGHT >>> LEFT  without wheezes or crackles Cardiovascular: Regular rate and rhythm without murmur gallop or rub normal S1 and S2.  3 chest tubes present on the RIGHT side draining serosanguineous fluid.  Negative air leak. Abdomen: OBESE, abdominal pain, nondistended, positive soft, bowel sounds, no rebound, no ascites, no appreciable mass Extremities: No significant cyanosis, clubbing, or edema bilateral lower extremities Skin: Negative rashes, lesions, ulcers Psychiatric:  Negative depression, negative anxiety, negative fatigue, negative mania  Central nervous system:  Cranial nerves II through XII intact, tongue/uvula midline, all extremities muscle strength 5/5, sensation intact throughout, negative dysarthria, negative expressive aphasia, negative receptive aphasia.  .     Data Reviewed: Care during the described time interval was provided by me .  I have reviewed this patient's available data, including medical history, events of note, physical examination, and all test results as part of my evaluation.  CBC: Recent Labs  Lab 05/10/20 0245 05/10/20 1730 05/11/20 0747 05/11/20 1135 05/13/20 0434 05/14/20 0415 05/14/20 1320 05/15/20 0202 05/16/20 0148  WBC 15.8*  --  10.3   < > 8.7 5.0 6.5 4.5 3.7*  NEUTROABS 13.8*  --  8.4*  --   --   --   --   --  2.5  HGB 11.0*   < > 8.5*   < > 8.6* 8.1* 8.3* 8.9* 9.1*  HCT 35.8*   < > 27.3*   < > 28.1* 26.5* 27.6* 28.9* 29.2*  MCV 93.0  --  90.4   < > 92.4 93.6 93.9 93.5 93.0  PLT 126*  --  127*   < > 114* 96* 92* 87* 79*   < > = values in this interval not displayed.   Basic Metabolic Panel: Recent Labs  Lab 05/10/20 0245  05/11/20 1028 05/12/20 0242 05/12/20 1336 05/12/20 1524 05/13/20 0434 05/14/20 0415 05/15/20 0202 05/16/20 0148  NA 137   < > 139   < > 138 138 132* 137 141  K 4.7   < > 4.8   < > 4.9 4.6 4.2 4.6 4.1  CL 100   < > 99  --   --  99 96* 94* 93*  CO2 33*   < > 33*  --   --  31 33* 34* 38*  GLUCOSE 255*   < > 169*  --   --  112* 181* 220* 254*  BUN 19   < > 21*  --   --  CREATININE 0.70   < > 0.87  --   --  0.57 0.58 0.62  0.66  CALCIUM 8.0*   < > 8.0*  --   --  7.9* 7.6* 8.1* 8.3*  MG 1.8  --  1.8  --   --   --  1.7 1.6* 1.9  PHOS 2.6  --  3.4  --   --   --   --  3.1 2.6   < > = values in this interval not displayed.   GFR: Estimated Creatinine Clearance: 98.6 mL/min (by C-G formula based on SCr of 0.66 mg/dL). Liver Function Tests: Recent Labs  Lab 05/11/20 1028 05/12/20 0242 05/14/20 0415 05/15/20 0202 05/16/20 0148  AST 12*  --  11*  --  13*  ALT 13  --  14  --  14  ALKPHOS 41  --  42  --  53  BILITOT 0.5  --  0.4  --  0.5  PROT 3.5*  --  4.1*  --  4.7*  ALBUMIN 1.4* 1.4* 1.3* 1.5* 1.6*   No results for input(s): LIPASE, AMYLASE in the last 168 hours. No results for input(s): AMMONIA in the last 168 hours. Coagulation Profile: Recent Labs  Lab 05/11/20 1135  INR 1.0   Cardiac Enzymes: No results for input(s): CKTOTAL, CKMB, CKMBINDEX, TROPONINI in the last 168 hours. BNP (last 3 results) No results for input(s): PROBNP in the last 8760 hours. HbA1C: No results for input(s): HGBA1C in the last 72 hours. CBG: Recent Labs  Lab 05/15/20 0631 05/15/20 1110 05/15/20 1534 05/15/20 2100 05/16/20 0620  GLUCAP 164* 161* 177* 271* 196*   Lipid Profile: No results for input(s): CHOL, HDL, LDLCALC, TRIG, CHOLHDL, LDLDIRECT in the last 72 hours. Thyroid Function Tests: No results for input(s): TSH, T4TOTAL, FREET4, T3FREE, THYROIDAB in the last 72 hours. Anemia Panel: No results for input(s): VITAMINB12, FOLATE, FERRITIN, TIBC, IRON, RETICCTPCT in the  last 72 hours. Sepsis Labs: Recent Labs  Lab 05/10/20 0245 05/11/20 0747  PROCALCITON 0.18 0.26    Recent Results (from the past 240 hour(s))  Culture, blood (routine x 2)     Status: None   Collection Time: 05/08/20 12:16 AM   Specimen: BLOOD  Result Value Ref Range Status   Specimen Description BLOOD LEFT HAND  Final   Special Requests   Final    BOTTLES DRAWN AEROBIC AND ANAEROBIC Blood Culture results may not be optimal due to an excessive volume of blood received in culture bottles   Culture   Final    NO GROWTH 5 DAYS Performed at [facility], [address], [place], [place] [address]    Report Status 05/13/2020 FINAL  Final  Culture, blood (routine x 2)     Status: None   Collection Time: 05/08/20 12:16 AM   Specimen: BLOOD  Result Value Ref Range Status   Specimen Description BLOOD RIGHT ARM  Final   Special Requests   Final    BOTTLES DRAWN AEROBIC AND ANAEROBIC Blood Culture results may not be optimal due to an excessive volume of blood received in culture bottles   Culture   Final    NO GROWTH 5 DAYS Performed at [facility], [address], [place], [place] [address]    Report Status 05/13/2020 FINAL  Final  Surgical pcr screen     Status: Abnormal   Collection Time: 05/11/20 11:55 AM   Specimen: Nasal Mucosa; Nasal Swab  Result Value Ref Range Status   MRSA, PCR POSITIVE (A) NEGATIVE Final    Comment: RESULT CALLED TO, READ  BACK BY AND VERIFIED WITH: RN A TEPE [id number]    Staphylococcus aureus POSITIVE (A) NEGATIVE Final    Comment: (NOTE) The Xpert SA Assay (FDA approved for NASAL specimens in patients 62 years of age and older), is one component of a comprehensive surveillance program. It is not intended to diagnose infection nor to guide or monitor treatment. Performed at [facility], [address] [place], [place] [address]          Radiology Studies: DG Chest 1 View  Result Date: 05/16/2020 CLINICAL DATA:   Shortness of breath EXAM: CHEST  1 VIEW COMPARISON:  May 15, 2020 FINDINGS: Chest tubes on the right are unchanged in position. No pneumothorax. Airspace consolidation throughout much of the right lung, most severe in the right upper lobe region, is stable there is apparent atelectatic change in the left lower lobe region. No new opacity evident. Heart size and pulmonary vascularity are within normal limits and stable. No adenopathy. No bone lesions IMPRESSION: Chest tubes unchanged in position on the right without pneumothorax. Extensive consolidation remains on the right with atelectatic change in the left lower lung region. No new opacity. Stable cardiac silhouette. Electronically Signed   By: Bretta Bang III M.D.   On: 05/16/2020 07:55   DG CHEST PORT 1 VIEW  Result Date: 05/15/2020 CLINICAL DATA:  Cough. Airspace consolidation. Chest tubes in place. EXAM: PORTABLE CHEST 1 VIEW COMPARISON:  May 14, 2020 FINDINGS: Central catheter has been removed. There are 3 chest tubes on the right, unchanged in position. No appreciable pneumothorax. Extensive airspace consolidation throughout much of the right lung, particularly in the right upper lobe, is stable. There is stable atelectasis in the left base. No new opacity evident. Heart size and pulmonary vascularity are within normal limits. No adenopathy evident. No bone lesions. IMPRESSION: Extensive airspace consolidation throughout much of the right lung, stable. Left base atelectasis. No new opacity appreciable. Chest tubes remain on the right, unchanged in position, without pneumothorax. Stable cardiac silhouette. Electronically Signed   By: Bretta Bang III M.D.   On: 05/15/2020 08:21   VAS [place] LOWER EXTREMITY VENOUS (DVT)  Result Date: 05/14/2020  Lower Venous DVT Study Indications: Assess for residual clot. Other Indications: Per pt, no Hx of DVT- unable to see any prior studies                    suggesting DVT. Risk Factors: Recent?.  Performing Technologist: Levada Schilling RDMS, RVT  Examination Guidelines: A complete evaluation includes B-mode imaging, spectral Doppler, color Doppler, and power Doppler as needed of all accessible portions of each vessel. Bilateral testing is considered an integral part of a complete examination. Limited examinations for reoccurring indications may be performed as noted. The reflux portion of the exam is performed with the patient in reverse Trendelenburg.  +---------+---------------+---------+-----------+----------+--------------+ RIGHT    CompressibilityPhasicitySpontaneityPropertiesThrombus Aging +---------+---------------+---------+-----------+----------+--------------+ CFV      Full           Yes      Yes                                 +---------+---------------+---------+-----------+----------+--------------+ SFJ      Full                                                        +---------+---------------+---------+-----------+----------+--------------+  FV Prox  Full                                                        +---------+---------------+---------+-----------+----------+--------------+ FV Mid   Full                                                        +---------+---------------+---------+-----------+----------+--------------+ FV DistalFull                                                        +---------+---------------+---------+-----------+----------+--------------+ PFV      Full                                                        +---------+---------------+---------+-----------+----------+--------------+ POP      Full           Yes      Yes                                 +---------+---------------+---------+-----------+----------+--------------+ PTV      Full                                                        +---------+---------------+---------+-----------+----------+--------------+ PERO     Full                                                         +---------+---------------+---------+-----------+----------+--------------+ GSV      Full                                                        +---------+---------------+---------+-----------+----------+--------------+   +---------+---------------+---------+-----------+----------+--------------+ LEFT     CompressibilityPhasicitySpontaneityPropertiesThrombus Aging +---------+---------------+---------+-----------+----------+--------------+ CFV      Full           Yes      Yes                                 +---------+---------------+---------+-----------+----------+--------------+ SFJ      Full                                                        +---------+---------------+---------+-----------+----------+--------------+  FV Prox  Full                                                        +---------+---------------+---------+-----------+----------+--------------+ FV Mid   Full                                                        +---------+---------------+---------+-----------+----------+--------------+ FV DistalFull                                                        +---------+---------------+---------+-----------+----------+--------------+ PFV      Full                                                        +---------+---------------+---------+-----------+----------+--------------+ POP      Full           Yes      Yes                                 +---------+---------------+---------+-----------+----------+--------------+ PTV      Full                                                        +---------+---------------+---------+-----------+----------+--------------+ PERO     Full                                                        +---------+---------------+---------+-----------+----------+--------------+ GSV      Full                                                         +---------+---------------+---------+-----------+----------+--------------+     Summary: RIGHT: - There is no evidence of deep vein thrombosis in the lower extremity.  - No cystic structure found in the popliteal fossa.  LEFT: - There is no evidence of deep vein thrombosis in the lower extremity.  - No cystic structure found in the popliteal fossa.  *See table(s) above for measurements and observations. Electronically signed by Waverly Ferrari MD on 05/14/2020 at 2:06:52 PM.    Final         Scheduled Meds: . (feeding supplement) PROSource Plus  30 mL Oral BID BM  . acetaminophen  1,000 mg Oral Q6H   Or  . acetaminophen (  TYLENOL) oral liquid 160 mg/5 mL  1,000 mg Oral Q6H  . atorvastatin  40 mg Oral Daily  . bisacodyl  10 mg Oral Daily  . Chlorhexidine Gluconate Cloth  6 each Topical Daily  . cholecalciferol  1,000 Units Oral Daily  . clonazePAM  0.5 mg Oral BID  . fenofibrate  160 mg Oral Daily  . furosemide  40 mg Intravenous BID  . guaiFENesin  600 mg Oral BID  . insulin aspart  5 Units Subcutaneous TID WC  . insulin glargine  20 Units Subcutaneous QHS  . lidocaine  1 patch Transdermal Q24H  . morphine   Intravenous Q4H  . multivitamin with minerals  1 tablet Oral Daily  . mupirocin ointment  1 application Nasal BID  . nystatin   Topical BID  . pregabalin  150 mg Oral TID  . senna-docusate  1 tablet Oral QHS  . umeclidinium bromide  1 puff Inhalation Daily   Continuous Infusions: . ampicillin-sulbactam (UNASYN) IV 3 g (05/16/20 0358)  . heparin 2,550 Units/hr (05/16/20 0040)     LOS: 9 days    Time spent:40 min    Kahliyah Dick, Roselind Messier, MD [facility] Pager [phone]  If 7PM-7AM, please contact night-coverage [email or web address] Password [email or web address] 05/16/2020, 8:59 AM

## 2020-05-16 NOTE — Consult Note (Signed)
WOC Nurse Consult Note: Patient receiving care in [facility] 4E01 Reason for Consult: MASD sores in thighs groin Wound type: MASD/IAD buttocks/thighs/groin  Wound bed: Pink dry Drainage (amount, consistency, odor) None Periwound: Pink Dressing procedure/placement/frequency: Apply Gerhardt's Butt cream to buttocks and groin area each shift or PRN soiling  Monitor the wound area(s) for worsening of condition such as: Signs/symptoms of infection, increase in size, development of or worsening of odor, development of pain, or increased pain at the affected locations.   Notify the medical team if any of these develop.  Thank you for the consult. WOC nurse will not follow at this time.   Please re-consult the WOC team if needed.  Renaldo Reel Katrinka Blazing, MSN, RN, CMSRN, Angus Seller, [facility] Wound Treatment Associate Pager [phone]

## 2020-05-16 NOTE — Progress Notes (Signed)
Occupational Therapy Treatment Patient Details Name: Annette [name] MRN: [id number] DOB: July 27, 1960 Today's Date: 05/16/2020    History of present illness 59 yo female admitted to [facility] on 05/03/2020 with R upper lobe MRSA pneumonia and R lower lobe subsegmental PE. Developed right-sided hydropneumothorax on 11/22 underwent chest tube insertion and on 05/06/2020 transfered to [facility] for CTS evaluation and treatment. Additional R chest tube placement 11/24.  On 11/28, patient underwent VATS/thoracotomy mechanical pleurodesis and wound VAC placement. PMH including Covid pneumonia approximately 2 months ago, recent PE on Eliquis, COPD, asthma, depression, hypertension, and diabetes.   OT comments  Patient found supine in bed, bedding saturated with urine.  Patient performed bed mobility with Mod A, sit to stand with min A times two for up to 20 seconds each.  OT able to change linens and pads while patient stood.  OT cleaned back and bottom of urine, and patient returned to bed to complete peri care.  Nursing notified pure wick not in place and would need to be changed.  Patient continues to present with barriers below, and discharge plan remains appropriate.  OT will continue to follow in the acute setting.    Follow Up Recommendations  Home health OT;Supervision/Assistance - 24 hour    Equipment Recommendations  3 in 1 bedside commode    Recommendations for Other Services      Precautions / Restrictions Precautions Precautions: Fall Precaution Comments: Chest tube, VAC       Mobility Bed Mobility   Bed Mobility: Supine to Sit;Sit to Supine     Supine to sit: Mod assist Sit to supine: Mod assist      Transfers     Transfers: Sit to/from Stand Sit to Stand: Min assist;From elevated surface              Balance   Sitting-balance support: No upper extremity supported Sitting balance-Leahy Scale: Fair     Standing balance support: Bilateral  upper extremity supported Standing balance-Leahy Scale: Poor                             ADL either performed or assessed with clinical judgement   ADL           Upper Body Bathing: Moderate assistance;Sitting       Upper Body Dressing : Moderate assistance;Sitting                                                                                                         General Comments  VSS    Pertinent Vitals/ Pain       Faces Pain Scale: Hurts little more Pain Location: neuropathy, back, and R chest tube site Pain Descriptors / Indicators: Burning  Frequency  Min 2X/week        Progress Toward Goals  OT Goals(current goals can now be found in the care plan section)  Progress towards OT goals: Progressing toward goals  Acute Rehab OT Goals Patient Stated Goal: to go home OT Goal Formulation: With patient Time For Goal Achievement: 05/28/20 Potential to Achieve Goals: Good  Plan Discharge plan remains appropriate    Co-evaluation                 AM-PAC OT "6 Clicks" Daily Activity     Outcome Measure   Help from another person eating meals?: None Help from another person taking care of personal grooming?: A Little Help from another person toileting, which includes using toliet, bedpan, or urinal?: A Lot Help from another person bathing (including washing, rinsing, drying)?: A Lot Help from another person to put on and taking off regular upper body clothing?: A Lot Help from another person to put on and taking off regular lower body clothing?: A Lot 6 Click Score: 15    End of Session Equipment Utilized During Treatment: Oxygen;Rolling walker  OT Visit Diagnosis: Unsteadiness on feet (R26.81);Other abnormalities of gait and mobility (R26.89);Muscle weakness (generalized) (M62.81)   Activity Tolerance Patient  tolerated treatment well   Patient Left in bed;with call bell/phone within reach   Nurse Communication Other (comment) (needs new pure wick)        Time: 8756-4332 OT Time Calculation (min): 38 min  Charges: OT General Charges $OT Visit: 1 Visit OT Treatments $Self Care/Home Management : 38-52 mins  05/16/2020  Rich, OTR/L  Acute Rehabilitation Services  Office:  [phone]    Suzanna Obey 05/16/2020, 8:52 AM

## 2020-05-16 NOTE — Plan of Care (Signed)

## 2020-05-17 ENCOUNTER — Inpatient Hospital Stay (HOSPITAL_COMMUNITY): Payer: Medicare Other | Admitting: Anesthesiology

## 2020-05-17 ENCOUNTER — Encounter (HOSPITAL_COMMUNITY): Payer: Self-pay | Admitting: Internal Medicine

## 2020-05-17 ENCOUNTER — Encounter (HOSPITAL_COMMUNITY)
Admission: AD | Disposition: A | Discharge status: Home / Self Care | Payer: Self-pay | Source: Other Acute Inpatient Hospital | Attending: Student

## 2020-05-17 DIAGNOSIS — J15212 Pneumonia due to Methicillin resistant Staphylococcus aureus: Secondary | ICD-10-CM | POA: Diagnosis not present

## 2020-05-17 DIAGNOSIS — Z7189 Other specified counseling: Secondary | ICD-10-CM

## 2020-05-17 DIAGNOSIS — J90 Pleural effusion, not elsewhere classified: Secondary | ICD-10-CM | POA: Diagnosis not present

## 2020-05-17 DIAGNOSIS — Z66 Do not resuscitate: Secondary | ICD-10-CM | POA: Diagnosis not present

## 2020-05-17 DIAGNOSIS — Z515 Encounter for palliative care: Secondary | ICD-10-CM | POA: Diagnosis not present

## 2020-05-17 DIAGNOSIS — J9601 Acute respiratory failure with hypoxia: Secondary | ICD-10-CM | POA: Diagnosis not present

## 2020-05-17 DIAGNOSIS — I97638 Postprocedural hematoma of a circulatory system organ or structure following other circulatory system procedure: Secondary | ICD-10-CM

## 2020-05-17 DIAGNOSIS — J431 Panlobular emphysema: Secondary | ICD-10-CM | POA: Diagnosis not present

## 2020-05-17 HISTORY — PX: WOUND EXPLORATION: SHX6188

## 2020-05-17 LAB — CBC WITH DIFFERENTIAL/PLATELET
Abs Immature Granulocytes: 0.06 10*3/uL (ref 0.00–0.07)
Basophils Absolute: 0 10*3/uL (ref 0.0–0.1)
Basophils Relative: 1 %
Eosinophils Absolute: 0.1 10*3/uL (ref 0.0–0.5)
Eosinophils Relative: 3 %
HCT: 25.2 % — ABNORMAL LOW (ref 36.0–46.0)
Hemoglobin: 7.6 g/dL — ABNORMAL LOW (ref 12.0–15.0)
Immature Granulocytes: 1 %
Lymphocytes Relative: 24 %
Lymphs Abs: 1.1 10*3/uL (ref 0.7–4.0)
MCH: 28.3 pg (ref 26.0–34.0)
MCHC: 30.2 g/dL (ref 30.0–36.0)
MCV: 93.7 fL (ref 80.0–100.0)
Monocytes Absolute: 0.4 10*3/uL (ref 0.1–1.0)
Monocytes Relative: 9 %
Neutro Abs: 2.9 10*3/uL (ref 1.7–7.7)
Neutrophils Relative %: 62 %
Platelets: 77 10*3/uL — ABNORMAL LOW (ref 150–400)
RBC: 2.69 MIL/uL — ABNORMAL LOW (ref 3.87–5.11)
RDW: 16.2 % — ABNORMAL HIGH (ref 11.5–15.5)
WBC: 4.6 10*3/uL (ref 4.0–10.5)
nRBC: 0 % (ref 0.0–0.2)

## 2020-05-17 LAB — COMPREHENSIVE METABOLIC PANEL
ALT: 12 U/L (ref 0–44)
AST: 11 U/L — ABNORMAL LOW (ref 15–41)
Albumin: 1.5 g/dL — ABNORMAL LOW (ref 3.5–5.0)
Alkaline Phosphatase: 47 U/L (ref 38–126)
Anion gap: 8 (ref 5–15)
BUN: 12 mg/dL (ref 6–20)
CO2: 37 mmol/L — ABNORMAL HIGH (ref 22–32)
Calcium: 7.9 mg/dL — ABNORMAL LOW (ref 8.9–10.3)
Chloride: 93 mmol/L — ABNORMAL LOW (ref 98–111)
Creatinine, Ser: 0.53 mg/dL (ref 0.44–1.00)
GFR, Estimated: >60 mL/min (ref >60)
Glucose, Bld: 207 mg/dL — ABNORMAL HIGH (ref 70–99)
Potassium: 3.8 mmol/L (ref 3.5–5.1)
Sodium: 138 mmol/L (ref 135–145)
Total Bilirubin: 0.5 mg/dL (ref 0.3–1.2)
Total Protein: 4.3 g/dL — ABNORMAL LOW (ref 6.5–8.1)

## 2020-05-17 LAB — GLUCOSE, CAPILLARY
Glucose-Capillary: 124 mg/dL — ABNORMAL HIGH (ref 70–99)
Glucose-Capillary: 165 mg/dL — ABNORMAL HIGH (ref 70–99)
Glucose-Capillary: 188 mg/dL — ABNORMAL HIGH (ref 70–99)
Glucose-Capillary: 208 mg/dL — ABNORMAL HIGH (ref 70–99)
Glucose-Capillary: 290 mg/dL — ABNORMAL HIGH (ref 70–99)
Glucose-Capillary: 326 mg/dL — ABNORMAL HIGH (ref 70–99)

## 2020-05-17 LAB — HEPARIN LEVEL (UNFRACTIONATED): Heparin Unfractionated: 0.37 IU/mL (ref 0.30–0.70)

## 2020-05-17 LAB — HEMOGLOBIN AND HEMATOCRIT, BLOOD
HCT: 26.9 % — ABNORMAL LOW (ref 36.0–46.0)
Hemoglobin: 8.2 g/dL — ABNORMAL LOW (ref 12.0–15.0)

## 2020-05-17 LAB — MAGNESIUM: Magnesium: 1.6 mg/dL — ABNORMAL LOW (ref 1.7–2.4)

## 2020-05-17 LAB — PHOSPHORUS: Phosphorus: 1.9 mg/dL — ABNORMAL LOW (ref 2.5–4.6)

## 2020-05-17 SURGERY — WOUND EXPLORATION
Anesthesia: General | Laterality: Right

## 2020-05-17 MED ORDER — ROCURONIUM BROMIDE 100 MG/10ML IV SOLN
INTRAVENOUS | Status: DC | PRN
  Administered 2020-05-17: 50 mg via INTRAVENOUS

## 2020-05-17 MED ORDER — LACTATED RINGERS IV SOLN: INTRAVENOUS | Status: DC | PRN

## 2020-05-17 MED ORDER — INSULIN ASPART 100 UNIT/ML ~~LOC~~ SOLN
0.0000 [IU] | SUBCUTANEOUS | Status: DC
  Administered 2020-05-17: 11 [IU] via SUBCUTANEOUS
  Administered 2020-05-17: 5 [IU] via SUBCUTANEOUS
  Administered 2020-05-17: 3 [IU] via SUBCUTANEOUS
  Administered 2020-05-18 (×2): 11 [IU] via SUBCUTANEOUS
  Administered 2020-05-18: 15 [IU] via SUBCUTANEOUS

## 2020-05-17 MED ORDER — CHLORHEXIDINE GLUCONATE 0.12 % MT SOLN
OROMUCOSAL | Status: AC
  Administered 2020-05-17: 15 mL via OROMUCOSAL
  Filled 2020-05-17: qty 15

## 2020-05-17 MED ORDER — HEPARIN (PORCINE) 25000 UT/250ML-% IV SOLN
2650.0000 [IU]/h | INTRAVENOUS | Status: DC
  Administered 2020-05-18 (×2): 2550 [IU]/h via INTRAVENOUS
  Administered 2020-05-19: 2000 [IU]/h via INTRAVENOUS
  Administered 2020-05-19 – 2020-05-20 (×2): 2550 [IU]/h via INTRAVENOUS
  Administered 2020-05-20 – 2020-05-21 (×2): 2650 [IU]/h via INTRAVENOUS
  Filled 2020-05-17 (×8): qty 250

## 2020-05-17 MED ORDER — FENTANYL CITRATE (PF) 100 MCG/2ML IJ SOLN
INTRAMUSCULAR | Status: DC | PRN
  Administered 2020-05-17: 100 ug via INTRAVENOUS

## 2020-05-17 MED ORDER — SUGAMMADEX SODIUM 200 MG/2ML IV SOLN
INTRAVENOUS | Status: DC | PRN
  Administered 2020-05-17: 225 mg via INTRAVENOUS

## 2020-05-17 MED ORDER — MIDAZOLAM HCL 5 MG/5ML IJ SOLN
INTRAMUSCULAR | Status: DC | PRN
  Administered 2020-05-17: 1 mg via INTRAVENOUS

## 2020-05-17 MED ORDER — SODIUM CHLORIDE 0.9 % IR SOLN
Status: DC | PRN
  Administered 2020-05-17: 1000 mL

## 2020-05-17 MED ORDER — LACTATED RINGERS IV SOLN: INTRAVENOUS | Status: DC

## 2020-05-17 MED ORDER — CEFAZOLIN (ANCEF) 1 G IV SOLR: 1.0000 g | INTRAVENOUS | Status: DC

## 2020-05-17 MED ORDER — POTASSIUM PHOSPHATES 15 MMOLE/5ML IV SOLN
30.0000 mmol | Freq: Once | INTRAVENOUS | Status: AC
  Administered 2020-05-17: 30 mmol via INTRAVENOUS
  Filled 2020-05-17: qty 10

## 2020-05-17 MED ORDER — MIDAZOLAM HCL 2 MG/2ML IJ SOLN
INTRAMUSCULAR | Status: AC
  Filled 2020-05-17: qty 2

## 2020-05-17 MED ORDER — ONDANSETRON HCL 4 MG/2ML IJ SOLN
INTRAMUSCULAR | Status: DC | PRN
  Administered 2020-05-17: 4 mg via INTRAVENOUS

## 2020-05-17 MED ORDER — MAGNESIUM SULFATE 2 GM/50ML IV SOLN
2.0000 g | Freq: Once | INTRAVENOUS | Status: AC
  Administered 2020-05-17: 2 g via INTRAVENOUS
  Filled 2020-05-17: qty 50

## 2020-05-17 MED ORDER — CEFAZOLIN SODIUM-DEXTROSE 2-3 GM-%(50ML) IV SOLR
INTRAVENOUS | Status: DC | PRN
  Administered 2020-05-17: 2 g via INTRAVENOUS

## 2020-05-17 MED ORDER — INSULIN ASPART 100 UNIT/ML ~~LOC~~ SOLN
8.0000 [IU] | Freq: Three times a day (TID) | SUBCUTANEOUS | Status: DC
  Administered 2020-05-17 – 2020-05-18 (×2): 8 [IU] via SUBCUTANEOUS

## 2020-05-17 MED ORDER — INSULIN GLARGINE 100 UNIT/ML ~~LOC~~ SOLN
22.0000 [IU] | Freq: Every day | SUBCUTANEOUS | Status: DC
  Administered 2020-05-17: 22 [IU] via SUBCUTANEOUS
  Filled 2020-05-17 (×2): qty 0.22

## 2020-05-17 MED ORDER — LIDOCAINE HCL (CARDIAC) PF 100 MG/5ML IV SOSY
PREFILLED_SYRINGE | INTRAVENOUS | Status: DC | PRN
  Administered 2020-05-17: 20 mg via INTRAVENOUS

## 2020-05-17 MED ORDER — ORAL CARE MOUTH RINSE: 15.0000 mL | Freq: Once | OROMUCOSAL | Status: AC

## 2020-05-17 MED ORDER — PROPOFOL 10 MG/ML IV BOLUS
INTRAVENOUS | Status: AC
  Filled 2020-05-17: qty 20

## 2020-05-17 MED ORDER — CEFAZOLIN SODIUM-DEXTROSE 2-4 GM/100ML-% IV SOLN
2.0000 g | INTRAVENOUS | Status: DC
  Filled 2020-05-17: qty 100

## 2020-05-17 MED ORDER — CHLORHEXIDINE GLUCONATE 0.12 % MT SOLN: 15.0000 mL | Freq: Once | OROMUCOSAL | Status: AC

## 2020-05-17 MED ORDER — DEXAMETHASONE SODIUM PHOSPHATE 4 MG/ML IJ SOLN
INTRAMUSCULAR | Status: DC | PRN
  Administered 2020-05-17: 5 mg via INTRAVENOUS

## 2020-05-17 MED ORDER — FENTANYL CITRATE (PF) 250 MCG/5ML IJ SOLN
INTRAMUSCULAR | Status: AC
  Filled 2020-05-17: qty 5

## 2020-05-17 MED ORDER — PROPOFOL 10 MG/ML IV BOLUS
INTRAVENOUS | Status: DC | PRN
  Administered 2020-05-17: 80 mg via INTRAVENOUS

## 2020-05-17 SURGICAL SUPPLY — 112 items
BAG DECANTER FOR FLEXI CONT (MISCELLANEOUS) ×1 IMPLANT
BANDAGE ESMARK 6X9 LF (GAUZE/BANDAGES/DRESSINGS) IMPLANT
BENZOIN TINCTURE PRP APPL 2/3 (GAUZE/BANDAGES/DRESSINGS) IMPLANT
BLADE CLIPPER SURG (BLADE) ×2 IMPLANT
BLADE SURG 10 STRL SS (BLADE) ×1 IMPLANT
BNDG ESMARK 6X9 LF (GAUZE/BANDAGES/DRESSINGS)
BNDG GAUZE ELAST 4 BULKY (GAUZE/BANDAGES/DRESSINGS) IMPLANT
CANISTER SUCT 3000ML PPV (MISCELLANEOUS) ×4 IMPLANT
CANISTER WOUND CARE 500ML ATS (WOUND CARE) ×1 IMPLANT
CATH FOLEY 2WAY SLVR  5CC 16FR (CATHETERS)
CATH FOLEY 2WAY SLVR 5CC 16FR (CATHETERS) IMPLANT
CLIP VESOCCLUDE MED 6/CT (CLIP) ×1 IMPLANT
CLIP VESOCCLUDE SM WIDE 24/CT (CLIP) IMPLANT
CNTNR URN SCR LID CUP LEK RST (MISCELLANEOUS) ×2 IMPLANT
CONN ST 1/4X3/8  BEN (MISCELLANEOUS)
CONN ST 1/4X3/8 BEN (MISCELLANEOUS) IMPLANT
CONN Y 3/8X3/8X3/8  BEN (MISCELLANEOUS)
CONN Y 3/8X3/8X3/8 BEN (MISCELLANEOUS) IMPLANT
CONT SPEC 4OZ STRL OR WHT (MISCELLANEOUS)
COVER SURGICAL LIGHT HANDLE (MISCELLANEOUS) ×3 IMPLANT
DERMABOND ADVANCED (GAUZE/BANDAGES/DRESSINGS)
DERMABOND ADVANCED .7 DNX12 (GAUZE/BANDAGES/DRESSINGS) ×1 IMPLANT
DRAIN CHANNEL 28F RND 3/8 FF (WOUND CARE) ×1 IMPLANT
DRAPE CV SPLIT W-CLR ANES SCRN (DRAPES) ×1 IMPLANT
DRAPE INCISE IOBAN 66X45 STRL (DRAPES) IMPLANT
DRAPE LAPAROSCOPIC ABDOMINAL (DRAPES) ×2 IMPLANT
DRAPE ORTHO SPLIT 77X108 STRL (DRAPES)
DRAPE SURG ORHT 6 SPLT 77X108 (DRAPES) ×1 IMPLANT
DRAPE WARM FLUID 44X44 (DRAPES) ×1 IMPLANT
DRSG AQUACEL AG ADV 3.5X14 (GAUZE/BANDAGES/DRESSINGS) ×1 IMPLANT
DRSG MEPILEX SACRM 8.7X9.8 (GAUZE/BANDAGES/DRESSINGS) ×1 IMPLANT
DRSG VAC ATS LRG SENSATRAC (GAUZE/BANDAGES/DRESSINGS) IMPLANT
DRSG VAC ATS MED SENSATRAC (GAUZE/BANDAGES/DRESSINGS) IMPLANT
DRSG VAC ATS SM SENSATRAC (GAUZE/BANDAGES/DRESSINGS) IMPLANT
ELECT BLADE 6.5 EXT (BLADE) ×2 IMPLANT
ELECT REM PT RETURN 9FT ADLT (ELECTROSURGICAL) ×2
ELECTRODE REM PT RTRN 9FT ADLT (ELECTROSURGICAL) ×2 IMPLANT
GAUZE SPONGE 4X4 12PLY STRL (GAUZE/BANDAGES/DRESSINGS) ×2 IMPLANT
GAUZE SPONGE 4X4 12PLY STRL LF (GAUZE/BANDAGES/DRESSINGS) ×1 IMPLANT
GAUZE XEROFORM 5X9 LF (GAUZE/BANDAGES/DRESSINGS) IMPLANT
GLOVE NEODERM STRL 7.5 LF PF (GLOVE) ×3 IMPLANT
GLOVE SURG NEODERM 7.5  LF PF (GLOVE) ×1
GOWN STRL REUS W/ TWL LRG LVL3 (GOWN DISPOSABLE) ×5 IMPLANT
GOWN STRL REUS W/TWL LRG LVL3 (GOWN DISPOSABLE) ×3
HANDPIECE INTERPULSE COAX TIP (DISPOSABLE)
HEMOSTAT POWDER SURGIFOAM 1G (HEMOSTASIS) IMPLANT
HEMOSTAT SURGICEL 2X14 (HEMOSTASIS) IMPLANT
KIT BASIN OR (CUSTOM PROCEDURE TRAY) ×3 IMPLANT
KIT SUCTION CATH 14FR (SUCTIONS) ×1 IMPLANT
KIT TURNOVER KIT B (KITS) ×3 IMPLANT
NS IRRIG 1000ML POUR BTL (IV SOLUTION) ×5 IMPLANT
PACK CHEST (CUSTOM PROCEDURE TRAY) ×2 IMPLANT
PACK GENERAL/GYN (CUSTOM PROCEDURE TRAY) ×1 IMPLANT
PAD ARMBOARD 7.5X6 YLW CONV (MISCELLANEOUS) ×6 IMPLANT
POUCH ENDO CATCH II 15MM (MISCELLANEOUS) IMPLANT
POUCH SPECIMEN RETRIEVAL 10MM (ENDOMECHANICALS) IMPLANT
SCISSORS LAP 5X35 DISP (ENDOMECHANICALS) IMPLANT
SEALANT PROGEL (MISCELLANEOUS) IMPLANT
SEALANT SURG COSEAL 4ML (VASCULAR PRODUCTS) IMPLANT
SEALANT SURG COSEAL 8ML (VASCULAR PRODUCTS) IMPLANT
SET HNDPC FAN SPRY TIP SCT (DISPOSABLE) IMPLANT
SHEARS HARMONIC HDI 20CM (ELECTROSURGICAL) IMPLANT
SOL ANTI FOG 6CC (MISCELLANEOUS) ×1 IMPLANT
SOL PREP POV-IOD 4OZ 10% (MISCELLANEOUS) IMPLANT
SOLUTION ANTI FOG 6CC (MISCELLANEOUS)
SPECIMEN JAR MEDIUM (MISCELLANEOUS) ×1 IMPLANT
SPONGE INTESTINAL PEANUT (DISPOSABLE) ×1 IMPLANT
SPONGE LAP 18X18 RF (DISPOSABLE) ×1 IMPLANT
SPONGE TONSIL TAPE 1 RFD (DISPOSABLE) ×1 IMPLANT
STAPLER VISISTAT 35W (STAPLE) ×1 IMPLANT
SUT ETHILON 2 LR (SUTURE) ×2 IMPLANT
SUT ETHILON 3 0 FSL (SUTURE) IMPLANT
SUT MNCRL AB 4-0 PS2 18 (SUTURE) ×1 IMPLANT
SUT PDS AB 1 CTX 36 (SUTURE) ×1 IMPLANT
SUT PROLENE 2 0 MH 48 (SUTURE) IMPLANT
SUT PROLENE 4 0 RB 1 (SUTURE)
SUT PROLENE 4-0 RB1 .5 CRCL 36 (SUTURE) IMPLANT
SUT SILK  1 MH (SUTURE)
SUT SILK 1 MH (SUTURE) ×2 IMPLANT
SUT SILK 1 TIES 10X30 (SUTURE) ×2 IMPLANT
SUT SILK 2 0 SH (SUTURE) IMPLANT
SUT SILK 2 0SH CR/8 30 (SUTURE) IMPLANT
SUT SILK 3 0 SH 30 (SUTURE) IMPLANT
SUT SILK 3 0SH CR/8 30 (SUTURE) ×1 IMPLANT
SUT VIC AB 0 CT1 27 (SUTURE)
SUT VIC AB 0 CT1 27XBRD ANBCTR (SUTURE) ×1 IMPLANT
SUT VIC AB 0 CTX 27 (SUTURE) IMPLANT
SUT VIC AB 1 CTX 27 (SUTURE) ×2 IMPLANT
SUT VIC AB 2-0 CT1 27 (SUTURE) ×1
SUT VIC AB 2-0 CT1 TAPERPNT 27 (SUTURE) IMPLANT
SUT VIC AB 2-0 CTX 27 (SUTURE) ×2 IMPLANT
SUT VIC AB 2-0 CTX 36 (SUTURE) ×2 IMPLANT
SUT VIC AB 3-0 MH 27 (SUTURE) IMPLANT
SUT VIC AB 3-0 SH 27 (SUTURE)
SUT VIC AB 3-0 SH 27X BRD (SUTURE) IMPLANT
SUT VIC AB 3-0 X1 27 (SUTURE) ×1 IMPLANT
SUT VICRYL 0 UR6 27IN ABS (SUTURE) IMPLANT
SUT VICRYL 2 TP 1 (SUTURE) IMPLANT
SWAB COLLECTION DEVICE MRSA (MISCELLANEOUS) IMPLANT
SWAB CULTURE ESWAB REG 1ML (MISCELLANEOUS) IMPLANT
SYR 30ML LL (SYRINGE) ×1 IMPLANT
SYR 5ML LL (SYRINGE) IMPLANT
SYR BULB IRRIG 60ML STRL (SYRINGE) ×1 IMPLANT
SYSTEM SAHARA CHEST DRAIN ATS (WOUND CARE) ×1 IMPLANT
TAPE CLOTH SURG 4X10 WHT LF (GAUZE/BANDAGES/DRESSINGS) ×1 IMPLANT
TIP APPLICATOR SPRAY EXTEND 16 (VASCULAR PRODUCTS) IMPLANT
TOWEL GREEN STERILE (TOWEL DISPOSABLE) ×3 IMPLANT
TOWEL GREEN STERILE FF (TOWEL DISPOSABLE) ×3 IMPLANT
TRAY FOLEY MTR SLVR 16FR STAT (SET/KITS/TRAYS/PACK) ×1 IMPLANT
TROCAR XCEL BLADELESS 5X75MML (TROCAR) ×1 IMPLANT
TROCAR XCEL NON-BLD 5MMX100MML (ENDOMECHANICALS) IMPLANT
WATER STERILE IRR 1000ML POUR (IV SOLUTION) ×3 IMPLANT

## 2020-05-17 NOTE — Progress Notes (Signed)
ANTICOAGULATION CONSULT NOTE  Pharmacy Consult for Heparin  Indication: pulmonary embolus  Allergies  Allergen Reactions  . Cymbalta [Duloxetine Hcl]     Psychosis "self-harm"   Patient Measurements: Height: 5\' 8"  (172.7 cm) Weight: 110.5 kg (243 lb 9.7 oz) IBW/kg (Calculated) : 63.9  Heparin dosing wt: 90 kg  Vital Signs: Temp: 97.6 F (36.4 C) (12/03 1710) Temp Source: Oral (12/03 1710) BP: 110/72 (12/03 1710) Pulse Rate: 89 (12/03 1710)  Assessment: 59 y/o F transfer from [facility]. Pt has PE and had been on Eliquis recently (last dose not clear) and was transferred on heparin. Pt is s/p chest tube placement and IV heparin was resumed for PE on 11/24. On 11/26, Hgb dropped from 11 to 7.7. CTA showed new right-sided hemothorax. Heparin was subsequently held. Restarted on 11/30.  Heparin has been therapeutic on 2550 units/hr. Heparin was held for chest wall wound closure. Orders given to continue to hold heparin tonight and resume tomorrow 12/4 at 0800 without a bolus.   Goal of Therapy:  Heparin level 0.3-0.7 units/ml  Monitor platelets by anticoagulation protocol: Yes   Plan:  Resume heparin infusion at 2550 units/hr on 12/4 at 0800.  Heparin level 6 hours after resumed.  Monitor daily HL, CBC, and for s/sx of bleeding  14/4, PharmD, BCPS, BCCCP Clinical Pharmacist Please refer to [facility] for [facility] Pharmacy numbers 05/17/2020 7:00 PM  Please check AMION for all [facility] Pharmacy phone numbers After 10:00 PM, call Main Pharmacy [phone]

## 2020-05-17 NOTE — Transfer of Care (Signed)
Immediate Anesthesia Transfer of Care Note  Patient: Annette [name]  Procedure(s) Performed: RIGHT CHEST WALL WOUND CLOSURE REMOVAL OF WOUND VAC (Right )  Patient Location: PACU  Anesthesia Type:General  Level of Consciousness: awake, alert , oriented and patient cooperative  Airway & Oxygen Therapy: Patient Spontanous Breathing and Patient connected to face mask oxygen  Post-op Assessment: Report given to RN and Post -op Vital signs reviewed and stable  Post vital signs: Reviewed and stable  Last Vitals:  Vitals Value Taken Time  BP 127/67 05/17/20 1539  Temp    Pulse 99 05/17/20 1540  Resp 18 05/17/20 1540  SpO2 97 % 05/17/20 1540  Vitals shown include unvalidated device data.  Last Pain:  Vitals:   05/17/20 1335  TempSrc:   PainSc: 5       Patients Stated Pain Goal: 3 (05/16/20 2000)  Complications: No complications documented.

## 2020-05-17 NOTE — Anesthesia Postprocedure Evaluation (Signed)
Anesthesia Post Note  Patient: Annette [name]  Procedure(s) Performed: RIGHT CHEST WALL WOUND CLOSURE REMOVAL OF WOUND VAC (Right )     Patient location during evaluation: PACU Anesthesia Type: General Level of consciousness: awake and alert, patient cooperative and oriented Pain management: pain level controlled Vital Signs Assessment: post-procedure vital signs reviewed and stable Respiratory status: spontaneous breathing, nonlabored ventilation, respiratory function stable and patient connected to nasal cannula oxygen Cardiovascular status: blood pressure returned to baseline and stable Postop Assessment: no apparent nausea or vomiting Anesthetic complications: no   No complications documented.  Last Vitals:  Vitals:   05/17/20 1700 05/17/20 1710  BP:  110/72  Pulse: 91 89  Resp: 15 16  Temp:  36.4 C  SpO2: 95% 96%    Last Pain:  Vitals:   05/17/20 1710  TempSrc: Oral  PainSc:                  Jujhar Everett,E. Otisha Spickler

## 2020-05-17 NOTE — Anesthesia Preprocedure Evaluation (Addendum)
Anesthesia Evaluation  Patient identified by MRN, date of birth, ID band Patient awake    Reviewed: Allergy & Precautions, NPO status , Patient's Chart, lab work & pertinent test results  History of Anesthesia Complications Negative for: history of anesthetic complications  Airway Mallampati: II  TM Distance: >3 FB Neck ROM: Full    Dental  (+) Edentulous Upper, Poor Dentition, Chipped, Dental Advisory Given   Pulmonary COPD, former smoker,  Covid pneumonia 2 months ago which was complicated by her embolisms.  She was admitted to [facility] for management of a right-sided MRSA pneumonia she subsequently developed a hydropneumothorax and required chest tube insertion, had VATS debridement on Sunday, now markedly better and for chest wall closure     breath sounds clear to auscultation       Cardiovascular hypertension, (-) angina Rhythm:Regular Rate:Normal     Neuro/Psych Anxiety Depression negative neurological ROS     GI/Hepatic Neg liver ROS, GERD  Medicated and Controlled,  Endo/Other  diabetes (glu 124), Oral Hypoglycemic Agents  Renal/GU negative Renal ROS     Musculoskeletal   Abdominal (+) + obese,   Peds  Hematology  (+) Blood dyscrasia (Hb 8.2, plt 77k), anemia ,   Anesthesia Other Findings   Reproductive/Obstetrics                            Anesthesia Physical Anesthesia Plan  ASA: III  Anesthesia Plan: General   Post-op Pain Management:    Induction: Intravenous  PONV Risk Score and Plan: 3 and Ondansetron, Dexamethasone and Treatment may vary due to age or medical condition  Airway Management Planned: Oral ETT  Additional Equipment: None  Intra-op Plan:   Post-operative Plan: Extubation in OR  Informed Consent: I have reviewed the patients History and Physical, chart, labs and discussed the procedure including the risks, benefits and alternatives for  the proposed anesthesia with the patient or authorized representative who has indicated his/her understanding and acceptance.   Patient has DNR.  Discussed DNR with patient and Suspend DNR.   Dental advisory given  Plan Discussed with: CRNA and Surgeon  Anesthesia Plan Comments:        Anesthesia Quick Evaluation

## 2020-05-17 NOTE — Consult Note (Signed)
Palliative Medicine Inpatient Consult Note  Reason for consult:  Goals of Care "patient who is noncompliant, frequent flyer, multisystem organ failure.  Evaluate DNR, discuss goals of care palliative vs hospice"  HPI:  Per intake H&P --> 59 yr WF with recent COVID-19 about 2 months ago, recent PE on Eliquis, COPD/Asthma not on oxygen, DM-2, HTN and HLD. Admitted for RUL MRSA pneumonia and RLL subsegmental PE (TTE with mod PAH but no right heart strain) treated with vancomycin, then Zyvox, and Lovenox. She developed right-sidedhydropneumothorax on 11/22 underwent chest tube insertion on 05/06/2020 and transferred to [facility] for CTS evaluation and treatment.   Palliative care was asked to get involved to further discuss goals of care.  Clinical Assessment/Goals of Care: I have reviewed medical records including EPIC notes, labs and imaging, received report from bedside RN, assessed the patient is lying in bed in no visible distress.    I met with Annette [name] to further discuss diagnosis prognosis, GOC, EOL wishes, disposition and options.   I introduced Palliative Medicine as specialized medical care for people living with serious illness. It focuses on providing relief from the symptoms and stress of a serious illness. The goal is to improve quality of life for both the patient and the family.  Annette [name] shares that she had outpatient palliative care prior to admission.  She does not recall which agency she was working with.  [name]  She is a widow as her husband passed away in 11-16-15.  She has three sons one of whom she lives with.  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  She shares that she gets the greatest joy out of her seven grandchildren four of which are fairly proximal in location to her.  She shares that she has four granddaughters and three grandsons.  Prior to hospital admission Keiera admits that she has not been  as mobile.  She shares that for the last few months after having suffered through COVID-19 she has had greater debility.  She is relied more so on her son for activities of daily living though she does share that she has been able to bathe herself stand with a Rollator and feed herself.  From a generalized goals perspective Annette [name] loves to cook and she is hopeful that someday she can gain enough strength to get throughout her kitchen on a office chair with wheels.  A detailed discussion was had today regarding advanced directives patient is never created these though if she were incapacitated she would rely on her son Annette [name] to make decisions for her.    Concepts specific to code status, artifical feeding and hydration, continued IV antibiotics and rehospitalization was had.  I completed a MOST form today. The patient and family outlined their wishes for the following treatment decisions:  Cardiopulmonary Resuscitation: Do Not Attempt Resuscitation (DNR/No CPR)  Medical Interventions: Limited Additional Interventions: Use medical treatment, IV fluids and cardiac monitoring as indicated, DO NOT USE intubation or mechanical ventilation. May consider use of less invasive airway support such as BiPAP or CPAP. Also provide comfort measures. Transfer to the hospital if indicated. Avoid intensive care.   Antibiotics: Determine use of limitation of antibiotics when infection occurs  IV Fluids: IV fluids for a defined trial period  Feeding Tube: No feeding tube   The difference between a aggressive medical intervention path  and a palliative comfort care path for this patient at this time was had.  At this point in time Annette [name] is hopeful that all of the cardiothoracic interventions will have a meaningful effect. She is to have a video-assisted thoracoscopy today.  She is hopeful that her chest tube will be able to be discontinued sooner than later.  Her goals ultimately are to get back home.  We talked about the  what if's.  She is very well aware that her condition may not improve if it did not she would be open to hospice care.  I described hospice as a service for patients for have a life expectancy of < 6 months. It preserves dignity and quality at the end phases of life. The focus changes from curative to symptom relief.  In the meanwhile Annette [name] is open to having her outpatient palliative providers follow-up with her.  Discussed the importance of continued conversation with family and their  medical providers regarding overall plan of care and treatment options, ensuring decisions are within the context of the patients values and GOCs.  Decision Maker: Patient is able to make decisions for herself though for any reason she were incapacitated she will rely upon her son Annette [name] to make decisions in her honor.  SUMMARY OF RECOMMENDATIONS   DNAR/DNI  MOST Completed, paper copy placed onto the chart electric copy can be found in [facility]  DNR Form Completed, paper copy placed onto the chart electric copy can be found in [facility]  TOC -appreciate resumption of outpatient palliative care support  PT/OT for generalized weakness  Code Status/Advance Care Planning: DNAR/DNI    Palliative Prophylaxis:   Oral care, mobility, pain management  Additional Recommendations (Limitations, Scope, Preferences):  Continue current scope of care    Psycho-social/Spiritual:   Desire for further Chaplaincy support:  Yes  Additional Recommendations:  Education on chronic disease processes   Prognosis:  Guarded in the setting of full comorbid conditions newly identified PE  Discharge Planning:  Unclear at this point  Vitals:   05/17/20 0317 05/17/20 0400  BP:  111/69  Pulse:  (!) 102  Resp: 19   Temp:  98.9 F (37.2 C)  SpO2: 96%     Intake/Output Summary (Last 24 hours) at 05/17/2020 2549 Last data filed at 05/17/2020 0543 Gross per 24 hour  Intake 1610.04 ml  Output 1815 ml  Net -204.96 ml   Last  Weight  Most recent update: 05/17/2020  4:13 AM   Weight  110.5 kg (243 lb 9.7 oz)           Gen:  NAD HEENT: moist mucous membranes CV: Regular rate and rhythm  PULM: 5LPM, [place] (+) Chest tube ABD: soft/nontender  EXT: (+) generalized edema Neuro: Alert and oriented x4  PPS: 40%  This conversation/these recommendations were discussed with patient primary care team, Dr. Sherral Hammers  Time In: 0630 Time Out: 0740 Total Time: 60 Greater than 50%  of this time was spent counseling and coordinating care related to the above assessment and plan.  [facility] Team Team Cell Phone: [phone] Please utilize secure chat with additional questions, if there is no response within 30 minutes please call the above phone number  Palliative Medicine Team providers are available by phone from 7am to 7pm daily and can be reached through the team cell phone.  Should this patient require assistance outside of these hours, please call the patient's attending physician.

## 2020-05-17 NOTE — Progress Notes (Signed)
ANTICOAGULATION CONSULT NOTE  Pharmacy Consult for Heparin  Indication: pulmonary embolus  Allergies  Allergen Reactions  . Cymbalta [Duloxetine Hcl]     Psychosis "self-harm"   Patient Measurements: Height: 5\' 8"  (172.7 cm) Weight: 110.5 kg (243 lb 9.7 oz) IBW/kg (Calculated) : 63.9  Heparin dosing wt: 90 kg  Vital Signs: Temp: 98.9 F (37.2 C) (12/03 0400) Temp Source: Oral (12/03 0400) BP: 111/69 (12/03 0400) Pulse Rate: 102 (12/03 0400)  Assessment: 59 y/o F transfer from [facility]. Pt has PE and had been on Eliquis recently (last dose not clear) and was transferred on heparin. Pt is s/p chest tube placement and IV heparin was resumed for PE on 11/24. On 11/26, Hgb dropped from 11 to 7.7. CTA showed new right-sided hemothorax. Heparin was subsequently held. Restarted on 11/30.  Heparin level remains therapeutic at 0.37, on 2550 units/hr. Hgb trending down today from 9.1 to 7.6, plt 77 (trending down). No s/sx of bleeding or infusion issues.   Goal of Therapy:  Heparin level 0.3-0.7 units/ml  Monitor platelets by anticoagulation protocol: Yes   Plan:  Continue heparin infusion at 2550 units/hr Monitor daily HL, CBC, and for s/sx of bleeding  12/30, PharmD, BCCCP Clinical Pharmacist  Phone: [phone] 05/17/2020 7:48 AM  Please check AMION for all [facility] Pharmacy phone numbers After 10:00 PM, call Main Pharmacy [phone]

## 2020-05-17 NOTE — Anesthesia Procedure Notes (Signed)
Procedure Name: Intubation Date/Time: 05/17/2020 3:04 PM Performed by: Imagene Riches, CRNA Pre-anesthesia Checklist: Patient identified, Emergency Drugs available, Suction available and Patient being monitored Patient Re-evaluated:Patient Re-evaluated prior to induction Oxygen Delivery Method: Circle System Utilized Preoxygenation: Pre-oxygenation with 100% oxygen Induction Type: IV induction Ventilation: Mask ventilation without difficulty Laryngoscope Size: Mac Grade View: Grade I Tube type: Oral Tube size: 7.0 mm Number of attempts: 1 Airway Equipment and Method: Stylet Placement Confirmation: ETT inserted through vocal cords under direct vision,  positive ETCO2 and breath sounds checked- equal and bilateral Secured at: 22 cm Tube secured with: Tape Dental Injury: Teeth and Oropharynx as per pre-operative assessment

## 2020-05-17 NOTE — H&P (Signed)
History and Physical Interval Note:  05/17/2020 1:56 PM  Annette [name]  has presented today for surgery, with the diagnosis of CHEST WALL WOUND.  The various methods of treatment have been discussed with the patient and family. After consideration of risks, benefits and other options for treatment, the patient has consented to  Procedure(s): RIGHT CHEST WALL WOUND CLOSURE (N/A) as a surgical intervention.  The patient's history has been reviewed, patient examined, no change in status, stable for surgery.  I have reviewed the patient's chart and labs.  Questions were answered to the patient's satisfaction.     [name]

## 2020-05-17 NOTE — Progress Notes (Signed)
PT Cancellation Note  Patient Details Name: Annette [name] MRN: [id number] DOB: 1960/10/14   Cancelled Treatment:    Reason Eval/Treat Not Completed: (P) Patient at procedure or test/unavailable (Pt remains off unit will defer PT services at this time.)   Florestine Avers 05/17/2020, 2:32 PM  Bonney Leitz , PTA Acute Rehabilitation Services Pager [phone] Office [phone]

## 2020-05-17 NOTE — Progress Notes (Addendum)
      [address]       [place] [address]             [phone]      5 Days Post-Op Procedure(s) (LRB): VIDEO ASSISTED THORACOSCOPY (VATS)/THOROCOTOMY MECCHANICAL PLEURO DESIS (Right) EVACUATION HEMATOMA (Right) Subjective:  No new concerns this morning.   Objective: Vital signs in last 24 hours: Temp:  [98.3 F (36.8 C)-99.7 F (37.6 C)] 98.9 F (37.2 C) (12/03 0400) Pulse Rate:  [10-113] 102 (12/03 0400) Cardiac Rhythm: Normal sinus rhythm (12/03 0744) Resp:  [16-20] 19 (12/03 0317) BP: (110-127)/(67-77) 111/69 (12/03 0400) SpO2:  [90 %-100 %] 92 % (12/03 0738) Weight:  [110.5 kg] 110.5 kg (12/03 0413)    Intake/Output from previous day: 12/02 0701 - 12/03 0700 In: 1610 [P.O.:840; I.V.:670; IV Piggyback:100] Out: 1815 [Urine:1700; Drains:25; Chest Tube:90] Intake/Output this shift: No intake/output data recorded.  General appearance: alert, cooperative and no distress Neurologic: intact Heart: regular rate and rhythm Lungs: has a wet cough and coarse crackles bilat,  breath sounds remain diminished on the right. CT drainage 56ml past 24 hours. Drainage is thin, serosanguinous fluid. No air leak. CXR 12/2 was stable. Wound: Continues to have extensive bruising to right chest but this has not extended.  The wound vac is in place and functioning appropriately.   Lab Results: Recent Labs    05/16/20 0148 05/17/20 0110  WBC 3.7* 4.6  HGB 9.1* 7.6*  HCT 29.2* 25.2*  PLT 79* 77*   BMET:  Recent Labs    05/16/20 0148 05/17/20 0110  NA 141 138  K 4.1 3.8  CL 93* 93*  CO2 38* 37*  GLUCOSE 254* 207*  BUN 13 12  CREATININE 0.66 0.53  CALCIUM 8.3* 7.9*    PT/INR: No results for input(s): LABPROT, INR in the last 72 hours. ABG    Component Value Date/Time   PHART 7.400 05/12/2020 1524   HCO3 36.1 (H) 05/12/2020 1524   TCO2 38 (H) 05/12/2020 1524   O2SAT 99.0 05/12/2020 1524   CBG (last 3)  Recent Labs    05/16/20 2203  05/17/20 0002 05/17/20 0611  GLUCAP 336* 290* 208*    Assessment/Plan: S/P Procedure(s) (LRB): VIDEO ASSISTED THORACOSCOPY (VATS)/THOROCOTOMY MECCHANICAL PLEURO DESIS (Right) EVACUATION HEMATOMA (Right)  -POD-5 Right VATS for mechanical pleurodesis, evacuation of chest wall hematoma following CT placement at OSH.  Has persistent right-side consolidation. ABX per primary team and needs continued encouragement to work on pulmonary hygiene.  Plan to return to the OR later today for  Removal of the wound vac and delayed primary closure of the right chest wound if appropriate.    Keep NPO    LOS: 10 days    Leary Roca, [place] [phone] 05/17/2020    Agree with documentation and plan. Dayonna Selbe Z. Vickey Sages, MD [phone]

## 2020-05-17 NOTE — Progress Notes (Signed)
PROGRESS NOTE    Annette [name]  ZOX:[id number] DOB: 1960-07-31 DOA: 05/07/2020 PCP: Patient, No Pcp Per     Brief Narrative:  59 yr WF with recent COVID-19 about 2 months ago, recent PE on Eliquis, COPD/Asthma not on oxygen, DM-2, HTN and HLD   Admitted to [facility] on 05/03/2020 where she was admitted for RUL MRSA pneumonia and RLL subsegmental PE (TTE with mod PAH but no right heart strain) treated with vancomycin, then Zyvox,  and Lovenox.  She developed right-sidedhydropneumothorax on 11/22 underwent chest tube insertion on 05/06/2020 and transferred to [facility] for CTS evaluation and treatment.   On arrival here, CT chest without contrast with moderately large right pneumothorax, subcutaneous emphysema throughout the right chest wall extending into right neck, pneumomediastinum, right upper lobe opacity and emphysema.  IR consulted by CTS and placed additional chest tube to right chest on 05/08/2020.    On 11/26, chest x-ray with near resolution of pneumothorax but persistent right lung opacity.  IV Unasyn added. Also had significant bleeding around chest tube.  Hemoglobin dropped 3 g.  Heparin held.  Transfused 2 units and stabilized.  CTA chest with new nonocclusive PE in RLL, persistent moderate right-sided pneumothorax with mediastinal shift to the left, new moderate sized right-sided pneumothorax, changed chest tube possibly causing pulmonary laceration and new large right flank hematoma measuring 12 cm without definite evidence of active extravasation, diffuse wall thickening of gallbladder, coarse airspace opacities and pulmonary fibrosis.   On 11/28, patient underwent VATS/thoracotomy mechanical pleurodesis and wound VAC placement, and sent to ICU out of concern about her respiratory status but remained stable 4 L oxygen by nasal cannula.  On 11/30, transferred out of ICU to progressive unit.  Heparin resumed.  Also started on IV Lasix by  CTS   Subjective: 12/3 afebrile overnight   Assessment & Plan: Covid vaccination; no vaccination (patient had Covid October)   Principal Problem:   MRSA pneumonia (HCC) Active Problems:   Pulmonary embolism (HCC)   Pneumothorax   Acute hypoxemic respiratory failure (HCC)   COPD (chronic obstructive pulmonary disease) (HCC)   Pressure injury of skin   Right-sided pneumothorax with mediastinal shift: -S/p VATS/thoracotomy mechanical pleurodesis -Flutter valve -[name]  Large right flank hematoma measuring about 12 cm- -S/p evacuation and wound VAC  Acute blood loss anemia:  Lab Results  Component Value Date   HGB 7.6 (L) 05/17/2020   HGB 9.1 (L) 05/16/2020   HGB 8.9 (L) 05/15/2020   HGB 8.3 (L) 05/14/2020   HGB 8.1 (L) 05/14/2020  -11/27 transfuse 2 units PRBC -11/28 transfuse 2 units PRBC   Right upper lobe MRSA pneumonia/Aspiration pneumonia:  -Patient has completed antibiotics for MRSA pneumonia. -We will complete course of Unasyn on 12/3 for her aspiration pneumonia. -Negative fever, leukocytosis.  -12/1 PCXR; whole right lung opacified see results below -Continue increased respiratory demand.  Acute RLL subsegmental PE:  -TTE at OSH with moderate PAH but no right heart strain. -Resume heparin.  -11/30 LE venous Doppler.  Negative for DVT  Acute respiratory failure with hypoxia  -Likely due to the above.  Stable on 3 to 4 L by nasal cannula. -Wean oxygen as able.  Minimum oxygen to keep saturation above 88% -Encourage incentive spirometry and flutter valve -Mobilize  DM type II uncontrolled with hyperglycemia  -11/24 hemoglobin A1c= 10.7 -Continue SSI-moderate -12/2 increase NovoLog 8units qac  -12/3 increase Lantus 22 units daily -Continue home statin.  Chronic COPD:  -Stable -On Xopenex and Incruse  Ellipta now  Hyperlipidemia -Continue Lipitor and fenofibrate  GERD -Continue PPI  Anxiety: Stable -Continue home  Klonopin  Neuropathy/chronic pain -Continue home Lyrica  History of COVID-19 infection:  -Treated for this about 2 months ago.  Leukocytosis/bandemia:  -Likely due to #1.  Resolved. -Continue monitoring  Thrombocytopenia:  -Platelet 126>> 113> 144> 96.  Consumptive from blood clot? -Continue monitoring  Intertrigo/cutaneous candidiasis -Diflucan 150 mg every 72 hours x2 -Topical nystatin powder  Morbid obesity: Body mass index is 37.44 kg/m. Nutrition Problem: Increased nutrient needs Etiology: wound healing, acute illness (recent COVID PNA) Signs/Symptoms: estimated needs Interventions: MVI, Ensure Enlive (each supplement provides 350kcal and 20 grams of protein)   Stage II pressure skin injury: POA Pressure Injury 05/07/20 Coccyx Medial;Mid Stage 2 -  Partial thickness loss of dermis presenting as a shallow open injury with a red, pink wound bed without slough. (Active)  05/07/20 2100  Location: Coccyx  Location Orientation: Medial;Mid  Staging: Stage 2 -  Partial thickness loss of dermis presenting as a shallow open injury with a red, pink wound bed without slough.  Wound Description (Comments):   Present on Admission: Yes   Hypomagnesmia -Magnesium goal> 2 -Magnesium IV 2 g  Goals of care -Spoke at length with NCM Kristi patient is a frequent flyer here who is noncompliant refuses SNF.  Will place consult for palliative care -12/2 Palliative Care consult; patient who is noncompliant, frequent flyer, multisystem organ failure.  Evaluate DNR, discuss goals of care palliative vs hospice   DVT prophylaxis: Heparin subcu Code Status: Full Family Communication:  Status is: Inpatient    Dispo: The patient is from: Home              Anticipated d/c is to:??              Anticipated d/c date is: 12/9              Patient currently unstable      Consultants:  IR Cardiothoracic surgery   Procedures/Significant Events:  05/06/2020-right chest tube  placement at outside hospital 05/08/2020-second right chest tube placement by IR  05/12/2020-VATS/thoracotomy mechanical pleurodesis and wound VAC placement 11/30 bilateral lower extremity Doppler; negative DVT 12/1 PCXR-extensive airspace consolidation throughout much of the right lung, stable. -Left base atelectasis. No new opacity appreciable. Chest tubes remain on the right, unchanged in position, without pneumothorax. Stable cardiac silhouette.  I have personally reviewed and interpreted all radiology studies and my findings are as above.  VENTILATOR SETTINGS: Nasal cannula 12/3 Flow; 5 L/min SPO2 92%   Cultures Blood cultures NGTD. MRSA PCR positive.   Antimicrobials: Anti-infectives (From admission, onward)   Start     Ordered Stop   05/10/20 1300  fluconazole (DIFLUCAN) tablet 150 mg        05/10/20 1151 05/13/20 1408   05/10/20 0900  Ampicillin-Sulbactam (UNASYN) 3 g in sodium chloride 0.9 % 100 mL IVPB        05/10/20 0753 05/17/20 0859   05/08/20 0030  linezolid (ZYVOX) tablet 600 mg  Status:  Discontinued        05/08/20 0015 05/14/20 1113       Devices    LINES / TUBES:      Continuous Infusions: . ampicillin-sulbactam (UNASYN) IV 3 g (05/17/20 0538)  . heparin 2,550 Units/hr (05/17/20 0543)     Objective: Vitals:   05/17/20 0317 05/17/20 0400 05/17/20 0413 05/17/20 0738  BP:  111/69    Pulse:  (!) 102  Resp: 19     Temp:  98.9 F (37.2 C)    TempSrc:  Oral    SpO2: 96%   92%  Weight:   110.5 kg   Height:        Intake/Output Summary (Last 24 hours) at 05/17/2020 0801 Last data filed at 05/17/2020 0543 Gross per 24 hour  Intake 1610.04 ml  Output 1815 ml  Net -204.96 ml   Filed Weights   05/15/20 0446 05/16/20 0358 05/17/20 0413  Weight: 112.4 kg 112.8 kg 110.5 kg    Examination:  General: A/O x4, positive acute respiratory distress Eyes: negative scleral hemorrhage, negative anisocoria, negative icterus ENT: Negative Runny  nose, negative gingival bleeding, Neck:  Negative scars, masses, torticollis, lymphadenopathy, JVD Lungs: decreased breath sounds bilateral RIGHT >>> LEFT  without wheezes or crackles Cardiovascular: Regular rate and rhythm without murmur gallop or rub normal S1 and S2.  3 chest tubes present on the RIGHT side draining serosanguineous fluid.  Negative air leak. Abdomen: OBESE, abdominal pain, nondistended, positive soft, bowel sounds, no rebound, no ascites, no appreciable mass Extremities: No significant cyanosis, clubbing, or edema bilateral lower extremities Skin: Negative rashes, lesions, ulcers Psychiatric:  Negative depression, negative anxiety, negative fatigue, negative mania  Central nervous system:  Cranial nerves II through XII intact, tongue/uvula midline, all extremities muscle strength 5/5, sensation intact throughout, negative dysarthria, negative expressive aphasia, negative receptive aphasia.  .     Data Reviewed: Care during the described time interval was provided by me .  I have reviewed this patient's available data, including medical history, events of note, physical examination, and all test results as part of my evaluation.  CBC: Recent Labs  Lab 05/11/20 0747 05/11/20 1135 05/14/20 0415 05/14/20 1320 05/15/20 0202 05/16/20 0148 05/17/20 0110  WBC 10.3   < > 5.0 6.5 4.5 3.7* 4.6  NEUTROABS 8.4*  --   --   --   --  2.5 2.9  HGB 8.5*   < > 8.1* 8.3* 8.9* 9.1* 7.6*  HCT 27.3*   < > 26.5* 27.6* 28.9* 29.2* 25.2*  MCV 90.4   < > 93.6 93.9 93.5 93.0 93.7  PLT 127*   < > 96* 92* 87* 79* 77*   < > = values in this interval not displayed.   Basic Metabolic Panel: Recent Labs  Lab 05/12/20 0242 05/12/20 1336 05/13/20 0434 05/14/20 0415 05/15/20 0202 05/16/20 0148 05/17/20 0110  NA 139   < > 138 132* 137 141 138  K 4.8   < > 4.6 4.2 4.6 4.1 3.8  CL 99  --  99 96* 94* 93* 93*  CO2 33*  --  31 33* 34* 38* 37*  GLUCOSE 169*  --  112* 181* 220* 254* 207*  BUN  21*  --  13 12 14 13 12   CREATININE 0.87  --  0.57 0.58 0.62 0.66 0.53  CALCIUM 8.0*  --  7.9* 7.6* 8.1* 8.3* 7.9*  MG 1.8  --   --  1.7 1.6* 1.9 1.6*  PHOS 3.4  --   --   --  3.1 2.6 1.9*   < > = values in this interval not displayed.   GFR: Estimated Creatinine Clearance: 97.4 mL/min (by C-G formula based on SCr of 0.53 mg/dL). Liver Function Tests: Recent Labs  Lab 05/11/20 1028 05/11/20 1028 05/12/20 0242 05/14/20 0415 05/15/20 0202 05/16/20 0148 05/17/20 0110  AST 12*  --   --  11*  --  13* 11*  ALT 13  --   --  14  --  14 12  ALKPHOS 41  --   --  42  --  53 47  BILITOT 0.5  --   --  0.4  --  0.5 0.5  PROT 3.5*  --   --  4.1*  --  4.7* 4.3*  ALBUMIN 1.4*   < > 1.4* 1.3* 1.5* 1.6* 1.5*   < > = values in this interval not displayed.   No results for input(s): LIPASE, AMYLASE in the last 168 hours. No results for input(s): AMMONIA in the last 168 hours. Coagulation Profile: Recent Labs  Lab 05/11/20 1135  INR 1.0   Cardiac Enzymes: No results for input(s): CKTOTAL, CKMB, CKMBINDEX, TROPONINI in the last 168 hours. BNP (last 3 results) No results for input(s): PROBNP in the last 8760 hours. HbA1C: No results for input(s): HGBA1C in the last 72 hours. CBG: Recent Labs  Lab 05/16/20 1647 05/16/20 2121 05/16/20 2203 05/17/20 0002 05/17/20 0611  GLUCAP 286* 360* 336* 290* 208*   Lipid Profile: No results for input(s): CHOL, HDL, LDLCALC, TRIG, CHOLHDL, LDLDIRECT in the last 72 hours. Thyroid Function Tests: No results for input(s): TSH, T4TOTAL, FREET4, T3FREE, THYROIDAB in the last 72 hours. Anemia Panel: No results for input(s): VITAMINB12, FOLATE, FERRITIN, TIBC, IRON, RETICCTPCT in the last 72 hours. Sepsis Labs: Recent Labs  Lab 05/11/20 0747  PROCALCITON 0.26    Recent Results (from the past 240 hour(s))  Culture, blood (routine x 2)     Status: None   Collection Time: 05/08/20 12:16 AM   Specimen: BLOOD  Result Value Ref Range Status   Specimen  Description BLOOD LEFT HAND  Final   Special Requests   Final    BOTTLES DRAWN AEROBIC AND ANAEROBIC Blood Culture results may not be optimal due to an excessive volume of blood received in culture bottles   Culture   Final    NO GROWTH 5 DAYS Performed at [facility], [address], [place], [place] [address]    Report Status 05/13/2020 FINAL  Final  Culture, blood (routine x 2)     Status: None   Collection Time: 05/08/20 12:16 AM   Specimen: BLOOD  Result Value Ref Range Status   Specimen Description BLOOD RIGHT ARM  Final   Special Requests   Final    BOTTLES DRAWN AEROBIC AND ANAEROBIC Blood Culture results may not be optimal due to an excessive volume of blood received in culture bottles   Culture   Final    NO GROWTH 5 DAYS Performed at [facility], [address], [place], [place] [address]    Report Status 05/13/2020 FINAL  Final  Surgical pcr screen     Status: Abnormal   Collection Time: 05/11/20 11:55 AM   Specimen: Nasal Mucosa; Nasal Swab  Result Value Ref Range Status   MRSA, PCR POSITIVE (A) NEGATIVE Final    Comment: RESULT CALLED TO, READ BACK BY AND VERIFIED WITH: RN A TEPE [id number]    Staphylococcus aureus POSITIVE (A) NEGATIVE Final    Comment: (NOTE) The Xpert SA Assay (FDA approved for NASAL specimens in patients 74 years of age and older), is one component of a comprehensive surveillance program. It is not intended to diagnose infection nor to guide or monitor treatment. Performed at [facility], [address], [place], [place] [address]          Radiology Studies:  DG Chest 1 View  Result Date: 05/16/2020 CLINICAL DATA:  Shortness of breath EXAM: CHEST  1 VIEW COMPARISON:  May 15, 2020 FINDINGS: Chest tubes on the right are unchanged in position. No pneumothorax. Airspace consolidation throughout much of the right lung, most severe in the right upper lobe region, is stable there is apparent atelectatic change in  the left lower lobe region. No new opacity evident. Heart size and pulmonary vascularity are within normal limits and stable. No adenopathy. No bone lesions IMPRESSION: Chest tubes unchanged in position on the right without pneumothorax. Extensive consolidation remains on the right with atelectatic change in the left lower lung region. No new opacity. Stable cardiac silhouette. Electronically Signed   By: Bretta Bang III M.D.   On: 05/16/2020 07:55        Scheduled Meds: . (feeding supplement) PROSource Plus  30 mL Oral BID BM  . acetaminophen  1,000 mg Oral Q6H   Or  . acetaminophen (TYLENOL) oral liquid 160 mg/5 mL  1,000 mg Oral Q6H  . atorvastatin  40 mg Oral Daily  . bisacodyl  10 mg Oral Daily  . Chlorhexidine Gluconate Cloth  6 each Topical Daily  . cholecalciferol  1,000 Units Oral Daily  . clonazePAM  0.5 mg Oral BID  . fenofibrate  160 mg Oral Daily  . furosemide  40 mg Intravenous BID  . Gerhardt's butt cream   Topical BID  . guaiFENesin  600 mg Oral BID  . insulin aspart  5 Units Subcutaneous TID WC  . insulin glargine  20 Units Subcutaneous QHS  . lidocaine  1 patch Transdermal Q24H  . morphine   Intravenous Q4H  . multivitamin with minerals  1 tablet Oral Daily  . nystatin   Topical BID  . pregabalin  150 mg Oral TID  . senna-docusate  1 tablet Oral QHS  . umeclidinium bromide  1 puff Inhalation Daily   Continuous Infusions: . ampicillin-sulbactam (UNASYN) IV 3 g (05/17/20 0538)  . heparin 2,550 Units/hr (05/17/20 0543)     LOS: 10 days    Time spent:40 min    Arneta Mahmood, Roselind Messier, MD [facility] Pager [phone]  If 7PM-7AM, please contact night-coverage [email or web address] Password [email or web address] 05/17/2020, 8:01 AM

## 2020-05-17 NOTE — Progress Notes (Signed)
PT Cancellation Note  Patient Details Name: Annette [name] MRN: [id number] DOB: Jul 04, 1960   Cancelled Treatment:    Reason Eval/Treat Not Completed: (P) Patient at procedure or test/unavailable (Pt off unit for wound vac removal and possible closure of R chest wound if appropriate.  Will f/u per POC.)   Kacia Halley Artis Delay 05/17/2020, 1:04 PM  Bonney Leitz , PTA Acute Rehabilitation Services Pager [phone] Office [phone]

## 2020-05-17 NOTE — Progress Notes (Addendum)
Per Dr. Barry Dienes restart heparin tomorrow (12/4) morning at 0800 with no bolus.

## 2020-05-18 ENCOUNTER — Encounter (HOSPITAL_COMMUNITY): Payer: Self-pay | Admitting: Cardiothoracic Surgery

## 2020-05-18 ENCOUNTER — Inpatient Hospital Stay (HOSPITAL_COMMUNITY): Payer: Medicare Other

## 2020-05-18 DIAGNOSIS — J9601 Acute respiratory failure with hypoxia: Secondary | ICD-10-CM | POA: Diagnosis not present

## 2020-05-18 DIAGNOSIS — Z66 Do not resuscitate: Secondary | ICD-10-CM | POA: Diagnosis not present

## 2020-05-18 DIAGNOSIS — Z7189 Other specified counseling: Secondary | ICD-10-CM | POA: Diagnosis not present

## 2020-05-18 DIAGNOSIS — J90 Pleural effusion, not elsewhere classified: Secondary | ICD-10-CM | POA: Diagnosis not present

## 2020-05-18 DIAGNOSIS — J431 Panlobular emphysema: Secondary | ICD-10-CM | POA: Diagnosis not present

## 2020-05-18 DIAGNOSIS — Z515 Encounter for palliative care: Secondary | ICD-10-CM | POA: Diagnosis not present

## 2020-05-18 DIAGNOSIS — J15212 Pneumonia due to Methicillin resistant Staphylococcus aureus: Secondary | ICD-10-CM | POA: Diagnosis not present

## 2020-05-18 LAB — CBC WITH DIFFERENTIAL/PLATELET
Abs Immature Granulocytes: 0.03 10*3/uL (ref 0.00–0.07)
Basophils Absolute: 0 10*3/uL (ref 0.0–0.1)
Basophils Relative: 0 %
Eosinophils Absolute: 0 10*3/uL (ref 0.0–0.5)
Eosinophils Relative: 0 %
HCT: 27.2 % — ABNORMAL LOW (ref 36.0–46.0)
Hemoglobin: 8.6 g/dL — ABNORMAL LOW (ref 12.0–15.0)
Immature Granulocytes: 1 %
Lymphocytes Relative: 10 %
Lymphs Abs: 0.4 10*3/uL — ABNORMAL LOW (ref 0.7–4.0)
MCH: 29.1 pg (ref 26.0–34.0)
MCHC: 31.6 g/dL (ref 30.0–36.0)
MCV: 91.9 fL (ref 80.0–100.0)
Monocytes Absolute: 0.2 10*3/uL (ref 0.1–1.0)
Monocytes Relative: 6 %
Neutro Abs: 3.1 10*3/uL (ref 1.7–7.7)
Neutrophils Relative %: 83 %
Platelets: 77 10*3/uL — ABNORMAL LOW (ref 150–400)
RBC: 2.96 MIL/uL — ABNORMAL LOW (ref 3.87–5.11)
RDW: 16.5 % — ABNORMAL HIGH (ref 11.5–15.5)
WBC: 3.8 10*3/uL — ABNORMAL LOW (ref 4.0–10.5)
nRBC: 0.8 % — ABNORMAL HIGH (ref 0.0–0.2)

## 2020-05-18 LAB — PHOSPHORUS: Phosphorus: 4.2 mg/dL (ref 2.5–4.6)

## 2020-05-18 LAB — GLUCOSE, CAPILLARY
Glucose-Capillary: 182 mg/dL — ABNORMAL HIGH (ref 70–99)
Glucose-Capillary: 196 mg/dL — ABNORMAL HIGH (ref 70–99)
Glucose-Capillary: 252 mg/dL — ABNORMAL HIGH (ref 70–99)
Glucose-Capillary: 319 mg/dL — ABNORMAL HIGH (ref 70–99)
Glucose-Capillary: 321 mg/dL — ABNORMAL HIGH (ref 70–99)
Glucose-Capillary: 405 mg/dL — ABNORMAL HIGH (ref 70–99)

## 2020-05-18 LAB — COMPREHENSIVE METABOLIC PANEL
ALT: 14 U/L (ref 0–44)
AST: 19 U/L (ref 15–41)
Albumin: 1.7 g/dL — ABNORMAL LOW (ref 3.5–5.0)
Alkaline Phosphatase: 60 U/L (ref 38–126)
Anion gap: 11 (ref 5–15)
BUN: 11 mg/dL (ref 6–20)
CO2: 39 mmol/L — ABNORMAL HIGH (ref 22–32)
Calcium: 8.7 mg/dL — ABNORMAL LOW (ref 8.9–10.3)
Chloride: 88 mmol/L — ABNORMAL LOW (ref 98–111)
Creatinine, Ser: 0.63 mg/dL (ref 0.44–1.00)
GFR, Estimated: >60 mL/min (ref >60)
Glucose, Bld: 380 mg/dL — ABNORMAL HIGH (ref 70–99)
Potassium: 4.7 mmol/L (ref 3.5–5.1)
Sodium: 138 mmol/L (ref 135–145)
Total Bilirubin: 0.6 mg/dL (ref 0.3–1.2)
Total Protein: 5 g/dL — ABNORMAL LOW (ref 6.5–8.1)

## 2020-05-18 LAB — MAGNESIUM: Magnesium: 1.9 mg/dL (ref 1.7–2.4)

## 2020-05-18 LAB — HEPARIN LEVEL (UNFRACTIONATED): Heparin Unfractionated: 0.31 IU/mL (ref 0.30–0.70)

## 2020-05-18 LAB — GLUCOSE, RANDOM: Glucose, Bld: 396 mg/dL — ABNORMAL HIGH (ref 70–99)

## 2020-05-18 MED ORDER — INSULIN ASPART 100 UNIT/ML ~~LOC~~ SOLN
0.0000 [IU] | SUBCUTANEOUS | Status: DC
  Administered 2020-05-18: 3 [IU] via SUBCUTANEOUS
  Administered 2020-05-18: 8 [IU] via SUBCUTANEOUS
  Administered 2020-05-18: 3 [IU] via SUBCUTANEOUS
  Administered 2020-05-19: 5 [IU] via SUBCUTANEOUS
  Administered 2020-05-19: 2 [IU] via SUBCUTANEOUS
  Administered 2020-05-19: 8 [IU] via SUBCUTANEOUS
  Administered 2020-05-19: 5 [IU] via SUBCUTANEOUS
  Administered 2020-05-19: 2 [IU] via SUBCUTANEOUS
  Administered 2020-05-19: 3 [IU] via SUBCUTANEOUS
  Administered 2020-05-20 (×2): 5 [IU] via SUBCUTANEOUS
  Administered 2020-05-20: 11 [IU] via SUBCUTANEOUS
  Administered 2020-05-20 (×2): 8 [IU] via SUBCUTANEOUS
  Administered 2020-05-21: 5 [IU] via SUBCUTANEOUS
  Administered 2020-05-21 (×2): 3 [IU] via SUBCUTANEOUS
  Administered 2020-05-21: 15 [IU] via SUBCUTANEOUS
  Administered 2020-05-21: 3 [IU] via SUBCUTANEOUS
  Administered 2020-05-21: 15 [IU] via SUBCUTANEOUS
  Administered 2020-05-22 (×2): 2 [IU] via SUBCUTANEOUS
  Administered 2020-05-22: 3 [IU] via SUBCUTANEOUS
  Administered 2020-05-22: 2 [IU] via SUBCUTANEOUS
  Administered 2020-05-22 (×2): 3 [IU] via SUBCUTANEOUS
  Administered 2020-05-23: 8 [IU] via SUBCUTANEOUS
  Administered 2020-05-23 (×2): 2 [IU] via SUBCUTANEOUS
  Administered 2020-05-24: 5 [IU] via SUBCUTANEOUS
  Administered 2020-05-24: 15 [IU] via SUBCUTANEOUS
  Administered 2020-05-24 (×3): 8 [IU] via SUBCUTANEOUS
  Administered 2020-05-25: 5 [IU] via SUBCUTANEOUS
  Administered 2020-05-25: 11 [IU] via SUBCUTANEOUS
  Administered 2020-05-25 – 2020-05-26 (×4): 3 [IU] via SUBCUTANEOUS
  Administered 2020-05-26: 8 [IU] via SUBCUTANEOUS
  Administered 2020-05-26 (×2): 5 [IU] via SUBCUTANEOUS
  Administered 2020-05-26: 8 [IU] via SUBCUTANEOUS
  Administered 2020-05-27: 5 [IU] via SUBCUTANEOUS
  Administered 2020-05-27: 2 [IU] via SUBCUTANEOUS
  Administered 2020-05-27: 5 [IU] via SUBCUTANEOUS
  Administered 2020-05-27: 3 [IU] via SUBCUTANEOUS
  Administered 2020-05-27: 8 [IU] via SUBCUTANEOUS
  Administered 2020-05-27: 3 [IU] via SUBCUTANEOUS
  Administered 2020-05-28: 5 [IU] via SUBCUTANEOUS
  Administered 2020-05-28: 2 [IU] via SUBCUTANEOUS
  Administered 2020-05-28 (×2): 8 [IU] via SUBCUTANEOUS
  Administered 2020-05-28 (×2): 3 [IU] via SUBCUTANEOUS
  Administered 2020-05-29 (×3): 5 [IU] via SUBCUTANEOUS
  Administered 2020-05-29: 3 [IU] via SUBCUTANEOUS

## 2020-05-18 MED ORDER — INSULIN GLARGINE 100 UNIT/ML ~~LOC~~ SOLN
25.0000 [IU] | Freq: Every day | SUBCUTANEOUS | Status: DC
  Administered 2020-05-18 – 2020-05-19 (×2): 25 [IU] via SUBCUTANEOUS
  Filled 2020-05-18 (×3): qty 0.25

## 2020-05-18 MED ORDER — INSULIN ASPART 100 UNIT/ML ~~LOC~~ SOLN
12.0000 [IU] | Freq: Three times a day (TID) | SUBCUTANEOUS | Status: DC
  Administered 2020-05-18 – 2020-05-21 (×10): 12 [IU] via SUBCUTANEOUS

## 2020-05-18 MED ORDER — INSULIN ASPART 100 UNIT/ML ~~LOC~~ SOLN
4.0000 [IU] | Freq: Once | SUBCUTANEOUS | Status: AC
  Administered 2020-05-18: 4 [IU] via SUBCUTANEOUS

## 2020-05-18 NOTE — Progress Notes (Signed)
ANTICOAGULATION CONSULT NOTE  Pharmacy Consult for Heparin  Indication: pulmonary embolus  Allergies  Allergen Reactions  . Cymbalta [Duloxetine Hcl]     Psychosis "self-harm"   Patient Measurements: Height: 5\' 8"  (172.7 cm) Weight: 107.3 kg (236 lb 8.9 oz) IBW/kg (Calculated) : 63.9  Heparin dosing wt: 90 kg  Vital Signs: Temp: 98.4 F (36.9 C) (12/04 1550) Temp Source: Oral (12/04 1550) BP: 106/72 (12/04 1550) Pulse Rate: 92 (12/04 1550)  Assessment: 59 y/o F transfer from [facility]. Pt has PE and had been on Eliquis recently (last dose not clear) and was transferred on heparin. Pt is s/p chest tube placement and IV heparin was resumed for PE on 11/24. On 11/26, Hgb dropped from 11 to 7.7. CTA showed new right-sided hemothorax. Heparin was subsequently held. Restarted on 11/30.  Heparin level now therapeutic   Goal of Therapy:  Heparin level 0.3-0.7 units/ml  Monitor platelets by anticoagulation protocol: Yes   Plan:  Continue heparin at 2550 units / hr Next heparin level in AM  Thank you 12/30, PharmD 05/18/2020 6:37 PM  Please check AMION for all [facility] phone numbers After 10:00 PM, call Main Pharmacy [phone]

## 2020-05-18 NOTE — Progress Notes (Signed)
PROGRESS NOTE    Annette ModenaKathy Dieudonne  ZOX:[id number] DOB: 1961-01-21 DOA: 05/07/2020 PCP: Patient, No Pcp Per     Brief Narrative:  59 yr WF with recent COVID-19 about 2 months ago, recent PE on Eliquis, COPD/Asthma not on oxygen, DM-2, HTN and HLD   Admitted to [facility] on 05/03/2020 where she was admitted for RUL MRSA pneumonia and RLL subsegmental PE (TTE with mod PAH but no right heart strain) treated with vancomycin, then Zyvox,  and Lovenox.  She developed right-sidedhydropneumothorax on 11/22 underwent chest tube insertion on 05/06/2020 and transferred to [facility] for CTS evaluation and treatment.   On arrival here, CT chest without contrast with moderately large right pneumothorax, subcutaneous emphysema throughout the right chest wall extending into right neck, pneumomediastinum, right upper lobe opacity and emphysema.  IR consulted by CTS and placed additional chest tube to right chest on 05/08/2020.    On 11/26, chest x-ray with near resolution of pneumothorax but persistent right lung opacity.  IV Unasyn added. Also had significant bleeding around chest tube.  Hemoglobin dropped 3 g.  Heparin held.  Transfused 2 units and stabilized.  CTA chest with new nonocclusive PE in RLL, persistent moderate right-sided pneumothorax with mediastinal shift to the left, new moderate sized right-sided pneumothorax, changed chest tube possibly causing pulmonary laceration and new large right flank hematoma measuring 12 cm without definite evidence of active extravasation, diffuse wall thickening of gallbladder, coarse airspace opacities and pulmonary fibrosis.   On 11/28, patient underwent VATS/thoracotomy mechanical pleurodesis and wound VAC placement, and sent to ICU out of concern about her respiratory status but remained stable 4 L oxygen by nasal cannula.  On 11/30, transferred out of ICU to progressive unit.  Heparin resumed.  Also started on IV Lasix by  CTS   Subjective: 12/4 afebrile overnight A/O x4, states side hurts a little bit where chest tube still are entering her right lateral chest wall.   Assessment & Plan: Covid vaccination; no vaccination (patient had Covid October)   Principal Problem:   MRSA pneumonia (HCC) Active Problems:   Pulmonary embolism (HCC)   Pneumothorax   Acute hypoxemic respiratory failure (HCC)   COPD (chronic obstructive pulmonary disease) (HCC)   Pressure injury of skin   Right-sided pneumothorax with mediastinal shift: -S/p VATS/thoracotomy mechanical pleurodesis -Flutter valve -[name] -12/3 s/p wound closure and wound VAC removal  Large right flank hematoma measuring about 12 cm- -S/p evacuation and wound VAC  Acute blood loss anemia:  Lab Results  Component Value Date   HGB 8.6 (L) 05/18/2020   HGB 8.2 (L) 05/17/2020   HGB 7.6 (L) 05/17/2020   HGB 9.1 (L) 05/16/2020   HGB 8.9 (L) 05/15/2020  -11/27 transfuse 2 units PRBC -11/28 transfuse 2 units PRBC   Right upper lobe MRSA pneumonia/Aspiration pneumonia:  -Patient has completed antibiotics for MRSA pneumonia. -We will complete course of Unasyn on 12/3 for her aspiration pneumonia. -Negative fever, leukocytosis.  -12/1 PCXR; whole right lung opacified see results below -Continue increased respiratory demand.  Acute RLL subsegmental PE:  -TTE at OSH with moderate PAH but no right heart strain. -Resume heparin.  -11/30 LE venous Doppler.  Negative for DVT  Acute respiratory failure with hypoxia  -Likely due to the above.  Stable on 3 to 4 L by nasal cannula. -Wean oxygen as able.  Minimum oxygen to keep saturation above 88% -Encourage incentive spirometry and flutter valve -Mobilize  DM type II uncontrolled with hyperglycemia  -11/24 hemoglobin A1c=  10.7 -Continue SSI-moderate -12/4 increase NovoLog 12 units qac  -12/4 increase Lantus 25 units daily -Moderate SSI -Continue home statin.  Chronic COPD:   -Stable -On Xopenex and Incruse Ellipta now  Hyperlipidemia -Continue Lipitor and fenofibrate  GERD -Continue PPI  Anxiety: Stable -Continue home Klonopin  Neuropathy/chronic pain -Continue home Lyrica  History of COVID-19 infection:  -Treated for this about 2 months ago.  Leukocytosis/bandemia:  -Likely due to #1.  Resolved. -Continue monitoring  Thrombocytopenia:  -Platelet 126>> 113> 144> 96.  Consumptive from blood clot? -Continue monitoring  Intertrigo/cutaneous candidiasis -Diflucan 150 mg every 72 hours x2 -Topical nystatin powder  Morbid obesity: Body mass index is 37.44 kg/m. Nutrition Problem: Increased nutrient needs Etiology: wound healing, acute illness (recent COVID PNA) Signs/Symptoms: estimated needs Interventions: MVI, Ensure Enlive (each supplement provides 350kcal and 20 grams of protein)   Stage II pressure skin injury: POA Pressure Injury 05/07/20 Coccyx Medial;Mid Stage 2 -  Partial thickness loss of dermis presenting as a shallow open injury with a red, pink wound bed without slough. (Active)  05/07/20 2100  Location: Coccyx  Location Orientation: Medial;Mid  Staging: Stage 2 -  Partial thickness loss of dermis presenting as a shallow open injury with a red, pink wound bed without slough.  Wound Description (Comments):   Present on Admission: Yes   Hypomagnesmia -Magnesium goal> 2 -Magnesium IV 2 g  Goals of care -Spoke at length with NCM Kristi patient is a frequent flyer here who is noncompliant refuses SNF.  Will place consult for palliative care -12/2 Palliative Care consult; patient who is noncompliant, frequent flyer, multisystem organ failure.  Evaluate DNR, discuss goals of care palliative vs hospice   DVT prophylaxis: Heparin subcu Code Status: Full Family Communication:  Status is: Inpatient    Dispo: The patient is from: Home              Anticipated d/c is to:??              Anticipated d/c date is: 12/9               Patient currently unstable      Consultants:  IR Cardiothoracic surgery   Procedures/Significant Events:  05/06/2020-right chest tube placement at outside hospital 05/08/2020-second right chest tube placement by IR  05/12/2020-VATS/thoracotomy mechanical pleurodesis and wound VAC placement 11/30 bilateral lower extremity Doppler; negative DVT 12/1 PCXR-extensive airspace consolidation throughout much of the right lung, stable. -Left base atelectasis. No new opacity appreciable. Chest tubes remain on the right, unchanged in position, without pneumothorax. Stable cardiac silhouette. 12/3 RIGHT CHEST WALL WOUND CLOSURE/removal wound VAC by cardiothoracic surgery  I have personally reviewed and interpreted all radiology studies and my findings are as above.  VENTILATOR SETTINGS: Nasal cannula 12/4 Flow; 5 L/min SPO2 99%   Cultures Blood cultures NGTD. MRSA PCR positive.   Antimicrobials: Anti-infectives (From admission, onward)   Start     Ordered Stop   05/10/20 1300  fluconazole (DIFLUCAN) tablet 150 mg        05/10/20 1151 05/13/20 1408   05/10/20 0900  Ampicillin-Sulbactam (UNASYN) 3 g in sodium chloride 0.9 % 100 mL IVPB        05/10/20 0753 05/17/20 0859   05/08/20 0030  linezolid (ZYVOX) tablet 600 mg  Status:  Discontinued        05/08/20 0015 05/14/20 1113       Devices    LINES / TUBES:      Continuous Infusions: .  ceFAZolin (  ANCEF) IV    . heparin 2,550 Units/hr (05/18/20 8366)     Objective: Vitals:   05/18/20 0436 05/18/20 0803 05/18/20 0920 05/18/20 1010  BP:   109/67   Pulse:      Resp: 17 19  18   Temp:      TempSrc:      SpO2: 96% 100%  99%  Weight:      Height:        Intake/Output Summary (Last 24 hours) at 05/18/2020 1039 Last data filed at 05/18/2020 1022 Gross per 24 hour  Intake 1140 ml  Output 5210 ml  Net -4070 ml   Filed Weights   05/16/20 0358 05/17/20 0413 05/18/20 0416  Weight: 112.8 kg 110.5 kg  107.3 kg    Examination:  General: A/O x4, positive acute respiratory distress Eyes: negative scleral hemorrhage, negative anisocoria, negative icterus ENT: Negative Runny nose, negative gingival bleeding, Neck:  Negative scars, masses, torticollis, lymphadenopathy, JVD Lungs: decreased breath sounds bilateral RIGHT >>> LEFT  without wheezes or crackles Cardiovascular: Regular rate and rhythm without murmur gallop or rub normal S1 and S2.  3 chest tubes present on the RIGHT side draining serosanguineous fluid.  Negative air leak. Abdomen: OBESE, abdominal pain, nondistended, positive soft, bowel sounds, no rebound, no ascites, no appreciable mass Extremities: No significant cyanosis, clubbing, or edema bilateral lower extremities Skin: Negative rashes, lesions, ulcers Psychiatric:  Negative depression, negative anxiety, negative fatigue, negative mania  Central nervous system:  Cranial nerves II through XII intact, tongue/uvula midline, all extremities muscle strength 5/5, sensation intact throughout, negative dysarthria, negative expressive aphasia, negative receptive aphasia.  .     Data Reviewed: Care during the described time interval was provided by me .  I have reviewed this patient's available data, including medical history, events of note, physical examination, and all test results as part of my evaluation.  CBC: Recent Labs  Lab 05/14/20 1320 05/14/20 1320 05/15/20 0202 05/16/20 0148 05/17/20 0110 05/17/20 1113 05/18/20 0236  WBC 6.5  --  4.5 3.7* 4.6  --  3.8*  NEUTROABS  --   --   --  2.5 2.9  --  3.1  HGB 8.3*   < > 8.9* 9.1* 7.6* 8.2* 8.6*  HCT 27.6*   < > 28.9* 29.2* 25.2* 26.9* 27.2*  MCV 93.9  --  93.5 93.0 93.7  --  91.9  PLT 92*  --  87* 79* 77*  --  77*   < > = values in this interval not displayed.   Basic Metabolic Panel: Recent Labs  Lab 05/12/20 0242 05/12/20 1336 05/14/20 0415 05/14/20 0415 05/15/20 0202 05/16/20 0148 05/17/20 0110  05/18/20 0236 05/18/20 0455  NA 139   < > 132*  --  137 141 138 138  --   K 4.8   < > 4.2  --  4.6 4.1 3.8 4.7  --   CL 99   < > 96*  --  94* 93* 93* 88*  --   CO2 33*   < > 33*  --  34* 38* 37* 39*  --   GLUCOSE 169*   < > 181*   < > 220* 254* 207* 380* 396*  BUN 21*   < > 12  --  14 13 12 11   --   CREATININE 0.87   < > 0.58  --  0.62 0.66 0.53 0.63  --   CALCIUM 8.0*   < > 7.6*  --  8.1* 8.3* 7.9* 8.7*  --  MG 1.8  --  1.7  --  1.6* 1.9 1.6* 1.9  --   PHOS 3.4  --   --   --  3.1 2.6 1.9* 4.2  --    < > = values in this interval not displayed.   GFR: Estimated Creatinine Clearance: 96 mL/min (by C-G formula based on SCr of 0.63 mg/dL). Liver Function Tests: Recent Labs  Lab 05/14/20 0415 05/15/20 0202 05/16/20 0148 05/17/20 0110 05/18/20 0236  AST 11*  --  13* 11* 19  ALT 14  --  14 12 14   ALKPHOS 42  --  53 47 60  BILITOT 0.4  --  0.5 0.5 0.6  PROT 4.1*  --  4.7* 4.3* 5.0*  ALBUMIN 1.3* 1.5* 1.6* 1.5* 1.7*   No results for input(s): LIPASE, AMYLASE in the last 168 hours. No results for input(s): AMMONIA in the last 168 hours. Coagulation Profile: Recent Labs  Lab 05/11/20 1135  INR 1.0   Cardiac Enzymes: No results for input(s): CKTOTAL, CKMB, CKMBINDEX, TROPONINI in the last 168 hours. BNP (last 3 results) No results for input(s): PROBNP in the last 8760 hours. HbA1C: No results for input(s): HGBA1C in the last 72 hours. CBG: Recent Labs  Lab 05/17/20 1742 05/17/20 2127 05/18/20 0039 05/18/20 0411 05/18/20 0930  GLUCAP 188* 326* 321* 405* 319*   Lipid Profile: No results for input(s): CHOL, HDL, LDLCALC, TRIG, CHOLHDL, LDLDIRECT in the last 72 hours. Thyroid Function Tests: No results for input(s): TSH, T4TOTAL, FREET4, T3FREE, THYROIDAB in the last 72 hours. Anemia Panel: No results for input(s): VITAMINB12, FOLATE, FERRITIN, TIBC, IRON, RETICCTPCT in the last 72 hours. Sepsis Labs: No results for input(s): PROCALCITON, LATICACIDVEN in the last 168  hours.  Recent Results (from the past 240 hour(s))  Surgical pcr screen     Status: Abnormal   Collection Time: 05/11/20 11:55 AM   Specimen: Nasal Mucosa; Nasal Swab  Result Value Ref Range Status   MRSA, PCR POSITIVE (A) NEGATIVE Final    Comment: RESULT CALLED TO, READ BACK BY AND VERIFIED WITH: RN A TEPE 05/13/20 1453 MLM    Staphylococcus aureus POSITIVE (A) NEGATIVE Final    Comment: (NOTE) The Xpert SA Assay (FDA approved for NASAL specimens in patients 72 years of age and older), is one component of a comprehensive surveillance program. It is not intended to diagnose infection nor to guide or monitor treatment. Performed at [facility], [address], [place], [place]          Radiology Studies: No results found.      Scheduled Meds: . (feeding supplement) PROSource Plus  30 mL Oral BID BM  . atorvastatin  40 mg Oral Daily  . bisacodyl  10 mg Oral Daily  . Chlorhexidine Gluconate Cloth  6 each Topical Daily  . cholecalciferol  1,000 Units Oral Daily  . clonazePAM  0.5 mg Oral BID  . fenofibrate  160 mg Oral Daily  . furosemide  40 mg Intravenous BID  . Gerhardt's butt cream   Topical BID  . guaiFENesin  600 mg Oral BID  . insulin aspart  0-15 Units Subcutaneous Q4H  . insulin aspart  8 Units Subcutaneous TID WC  . insulin glargine  22 Units Subcutaneous QHS  . lidocaine  1 patch Transdermal Q24H  . morphine   Intravenous Q4H  . multivitamin with minerals  1 tablet Oral Daily  . nystatin   Topical BID  . pregabalin  150 mg  Oral TID  . senna-docusate  1 tablet Oral QHS  . umeclidinium bromide  1 puff Inhalation Daily   Continuous Infusions: .  ceFAZolin (ANCEF) IV    . heparin 2,550 Units/hr (05/18/20 0939)     LOS: 11 days    Time spent:40 min    Harold Mattes, Roselind Messier, MD [facility] Pager [phone]  If 7PM-7AM, please contact night-coverage [email or web address] Password [email or web address] 05/18/2020, 10:39 AM

## 2020-05-18 NOTE — Progress Notes (Addendum)
      [address]       Annette [name] [address]             [phone]       1 Day Post-Op Procedure(s) (LRB): RIGHT CHEST WALL WOUND CLOSURE REMOVAL OF WOUND VAC (Right)  Subjective: Patient in good spirits this am. She states her breathing is "pretty good".  Objective: Vital signs in last 24 hours: Temp:  [97.6 F (36.4 C)-98.4 F (36.9 C)] 98.3 F (36.8 C) (12/04 0416) Pulse Rate:  [82-99] 89 (12/04 0416) Cardiac Rhythm: Normal sinus rhythm (12/03 1900) Resp:  [14-26] 17 (12/04 0436) BP: (105-127)/(59-80) 107/74 (12/04 0416) SpO2:  [91 %-100 %] 96 % (12/04 0436) Weight:  [107.3 kg] 107.3 kg (12/04 0416)     Intake/Output from previous day: 12/03 0701 - 12/04 0700 In: 900 [I.V.:900] Out: 5580 [Urine:5550; Drains:20; Blood:10]   Physical Exam:  Cardiovascular: RRR Pulmonary:Squeak and rub with chest tubes in place on the right;clear on the left Abdomen: Soft, non tender, bowel sounds present. Wounds: 3 Nylon sutures in place and right chest wound has some sero sanguinous drainage Chest Tubes:to suction, no air leak  Lab Results: OZH:YQMVHQ Labs    05/17/20 0110 05/17/20 0110 05/17/20 1113 05/18/20 0236  WBC 4.6  --   --  3.8*  HGB 7.6*   < > 8.2* 8.6*  HCT 25.2*   < > 26.9* 27.2*  PLT 77*  --   --  77*   < > = values in this interval not displayed.   BMET:  Recent Labs    05/17/20 0110 05/17/20 0110 05/18/20 0236 05/18/20 0455  NA 138  --  138  --   K 3.8  --  4.7  --   CL 93*  --  88*  --   CO2 37*  --  39*  --   GLUCOSE 207*   < > 380* 396*  BUN 12  --  11  --   CREATININE 0.53  --  0.63  --   CALCIUM 7.9*  --  8.7*  --    < > = values in this interval not displayed.    PT/INR: No results for input(s): LABPROT, INR in the last 72 hours. ABG:  INR: Will add last result for INR, ABG once components are confirmed Will add last 4 CBG results once components are confirmed  Assessment/Plan:  1. CV - SR 2.  Pulmonary -  History of COPD. On 4 liters of oxygen via [place]. Wean as able. 3 chest tubes but output not recorded. Marked Pleura VAC this am. Right drain with 20 cc since surgery? Will check CXR as not ordered for this am. 3. PE-on Heparin drip 4. DM-CBGs 326/321/405. On Insulin;per primary 5. Anemia-H and H this am increased to 8.6 and 27.2 6. Thrombocytopenia-platelets this am remain 77,000. Per primary 7. MRSA pneumonia-on Cefazolin  Donielle M ZimmermanPA-C 05/18/2020,7:16 AM [phone]   I have seen and examined the patient and agree with the assessment and plan as outlined.  Back on heparin.  Watch platelet count and watch for bleeding.  Leave all chest tubes in place for now.  Purcell Nails, MD 05/18/2020 11:33 AM

## 2020-05-18 NOTE — Progress Notes (Signed)
   Palliative Medicine Inpatient Follow Up Note   Reason for consult:  Goals of Care "patient who is noncompliant, frequent flyer, multisystem organ failure.  Evaluate DNR, discuss goals of care palliative vs hospice"  HPI:  Per intake H&P --> 59 yr WF withrecent COVID-19about 2 months ago, recent PE on Eliquis, COPD/Asthma not on oxygen, DM-2, HTN and HLD. Admitted for RUL MRSA pneumonia and RLL subsegmental PE(TTE with mod PAH but no right heart strain) treated with vancomycin, then Zyvox, and Lovenox. She developed right-sidedhydropneumothorax on 11/22 underwent chest tube insertion on 05/06/2020 and transferred to [facility] for CTS evaluation and treatment.   Palliative care was asked to get involved to further discuss goals of care.  Today's Discussion (05/18/2020): Chart reviewed.  I met with Annette [name] at bedside this afternoon.  We talked about her present situation and her hope to have some of her chest tubes removed in the near future -presently has 3 chest tubes in place.  Annette [name] shares that she remains hopeful for improvements, though she does realize that these may not happen in the way that she would like for them to.  I again asked Annette [name] which home palliative care service she has as I shared it might be useful for me to reach out to them to get a better impression of how she was doing at home. She again shares with me that she does not remember. I have requested that our MSW team help [place] with this effort.  We talked about her need for continued palliative care and/or hospice when she discharges to home.  Discussed the importance of continued conversation with family and their  medical providers regarding overall plan of care and treatment options, ensuring decisions are within the context of the patients values and GOCs.  Objective Assessment: Vital Signs Vitals:   05/18/20 1155 05/18/20 1250  BP: 109/69   [name]: 71   Resp: 17 16  Temp: 98.3 F (36.8 C)   SpO2: 98% 100%     Intake/Output Summary (Last 24 hours) at 05/18/2020 1455 Last data filed at 05/18/2020 1254 Gross per 24 hour  Intake 1680 ml  Output 5210 ml  Net -3530 ml   Last Weight  Most recent update: 05/18/2020  4:44 AM   Weight  107.3 kg (236 lb 8.9 oz)           SUMMARY OF RECOMMENDATIONS DNAR/DNI  MOST Completed, paper copy placed onto the chart electric copy can be found in [facility]yn[facility]  DNR Form Completed, paper copy placed onto the chart electric copy can be found in [facility]yn[facility]  TOC -appreciate resumption of outpatient palliative care support  PT/OT for generalized weakness  Time Spent: 25 Greater than 50% of the time was spent in counseling and coordination of care ______________________________________________________________________________________ [place] Team Team Cell Phone: [phone] Please utilize secure chat with additional questions, if there is no response within 30 minutes please call the above phone number  Palliative Medicine Team providers are available by phone from 7am to 7pm daily and can be reached through the team cell phone.  Should this patient require assistance outside of these hours, please call the patient's attending physician.

## 2020-05-19 ENCOUNTER — Inpatient Hospital Stay (HOSPITAL_COMMUNITY): Payer: Medicare Other

## 2020-05-19 DIAGNOSIS — Z515 Encounter for palliative care: Secondary | ICD-10-CM | POA: Diagnosis not present

## 2020-05-19 DIAGNOSIS — J431 Panlobular emphysema: Secondary | ICD-10-CM | POA: Diagnosis not present

## 2020-05-19 DIAGNOSIS — J9601 Acute respiratory failure with hypoxia: Secondary | ICD-10-CM | POA: Diagnosis not present

## 2020-05-19 DIAGNOSIS — Z7189 Other specified counseling: Secondary | ICD-10-CM

## 2020-05-19 DIAGNOSIS — Z66 Do not resuscitate: Secondary | ICD-10-CM

## 2020-05-19 DIAGNOSIS — J15212 Pneumonia due to Methicillin resistant Staphylococcus aureus: Secondary | ICD-10-CM | POA: Diagnosis not present

## 2020-05-19 DIAGNOSIS — J90 Pleural effusion, not elsewhere classified: Secondary | ICD-10-CM | POA: Diagnosis not present

## 2020-05-19 LAB — COMPREHENSIVE METABOLIC PANEL
ALT: 15 U/L (ref 0–44)
AST: 19 U/L (ref 15–41)
Albumin: 1.7 g/dL — ABNORMAL LOW (ref 3.5–5.0)
Alkaline Phosphatase: 58 U/L (ref 38–126)
Anion gap: 7 (ref 5–15)
BUN: 15 mg/dL (ref 6–20)
CO2: 42 mmol/L — ABNORMAL HIGH (ref 22–32)
Calcium: 8.3 mg/dL — ABNORMAL LOW (ref 8.9–10.3)
Chloride: 86 mmol/L — ABNORMAL LOW (ref 98–111)
Creatinine, Ser: 0.64 mg/dL (ref 0.44–1.00)
GFR, Estimated: >60 mL/min (ref >60)
Glucose, Bld: 302 mg/dL — ABNORMAL HIGH (ref 70–99)
Potassium: 4.6 mmol/L (ref 3.5–5.1)
Sodium: 135 mmol/L (ref 135–145)
Total Bilirubin: 0.8 mg/dL (ref 0.3–1.2)
Total Protein: 4.7 g/dL — ABNORMAL LOW (ref 6.5–8.1)

## 2020-05-19 LAB — GLUCOSE, CAPILLARY
Glucose-Capillary: 143 mg/dL — ABNORMAL HIGH (ref 70–99)
Glucose-Capillary: 147 mg/dL — ABNORMAL HIGH (ref 70–99)
Glucose-Capillary: 162 mg/dL — ABNORMAL HIGH (ref 70–99)
Glucose-Capillary: 211 mg/dL — ABNORMAL HIGH (ref 70–99)
Glucose-Capillary: 215 mg/dL — ABNORMAL HIGH (ref 70–99)
Glucose-Capillary: 258 mg/dL — ABNORMAL HIGH (ref 70–99)

## 2020-05-19 LAB — CBC WITH DIFFERENTIAL/PLATELET
Abs Immature Granulocytes: 0.05 10*3/uL (ref 0.00–0.07)
Basophils Absolute: 0 10*3/uL (ref 0.0–0.1)
Basophils Relative: 1 %
Eosinophils Absolute: 0.1 10*3/uL (ref 0.0–0.5)
Eosinophils Relative: 2 %
HCT: 26.5 % — ABNORMAL LOW (ref 36.0–46.0)
Hemoglobin: 8.1 g/dL — ABNORMAL LOW (ref 12.0–15.0)
Immature Granulocytes: 1 %
Lymphocytes Relative: 31 %
Lymphs Abs: 1.3 10*3/uL (ref 0.7–4.0)
MCH: 28.9 pg (ref 26.0–34.0)
MCHC: 30.6 g/dL (ref 30.0–36.0)
MCV: 94.6 fL (ref 80.0–100.0)
Monocytes Absolute: 0.4 10*3/uL (ref 0.1–1.0)
Monocytes Relative: 9 %
Neutro Abs: 2.4 10*3/uL (ref 1.7–7.7)
Neutrophils Relative %: 56 %
Platelets: 78 10*3/uL — ABNORMAL LOW (ref 150–400)
RBC: 2.8 MIL/uL — ABNORMAL LOW (ref 3.87–5.11)
RDW: 18 % — ABNORMAL HIGH (ref 11.5–15.5)
WBC: 4.2 10*3/uL (ref 4.0–10.5)
nRBC: 1.2 % — ABNORMAL HIGH (ref 0.0–0.2)

## 2020-05-19 LAB — MAGNESIUM: Magnesium: 1.7 mg/dL (ref 1.7–2.4)

## 2020-05-19 LAB — PHOSPHORUS: Phosphorus: 4.2 mg/dL (ref 2.5–4.6)

## 2020-05-19 LAB — HEPARIN LEVEL (UNFRACTIONATED): Heparin Unfractionated: 0.39 IU/mL (ref 0.30–0.70)

## 2020-05-19 MED ORDER — ONDANSETRON HCL 4 MG/2ML IJ SOLN: 4.0000 mg | Freq: Four times a day (QID) | INTRAMUSCULAR | Status: DC | PRN

## 2020-05-19 MED ORDER — MORPHINE SULFATE 2 MG/ML IV SOLN
INTRAVENOUS | Status: DC
  Administered 2020-05-19: 21 mg via INTRAVENOUS
  Administered 2020-05-19: 22.5 mg via INTRAVENOUS
  Administered 2020-05-19: 37.5 mg via INTRAVENOUS
  Administered 2020-05-19: 0 mg via INTRAVENOUS
  Administered 2020-05-20: 16.5 mg via INTRAVENOUS
  Filled 2020-05-19 (×4): qty 25

## 2020-05-19 MED ORDER — SODIUM CHLORIDE 0.9% FLUSH: 9.0000 mL | INTRAVENOUS | Status: DC | PRN

## 2020-05-19 MED ORDER — MAGNESIUM SULFATE 2 GM/50ML IV SOLN
2.0000 g | Freq: Once | INTRAVENOUS | Status: AC
  Administered 2020-05-19: 2 g via INTRAVENOUS
  Filled 2020-05-19: qty 50

## 2020-05-19 MED ORDER — DIPHENHYDRAMINE HCL 12.5 MG/5ML PO ELIX: 12.5000 mg | ORAL_SOLUTION | Freq: Four times a day (QID) | ORAL | Status: DC | PRN

## 2020-05-19 MED ORDER — DIPHENHYDRAMINE HCL 50 MG/ML IJ SOLN: 12.5000 mg | Freq: Four times a day (QID) | INTRAMUSCULAR | Status: DC | PRN

## 2020-05-19 MED ORDER — NALOXONE HCL 0.4 MG/ML IJ SOLN: 0.4000 mg | INTRAMUSCULAR | Status: DC | PRN

## 2020-05-19 NOTE — Progress Notes (Signed)
RN replaced PCA morphine syringe. 3 mL was remaining in previous syringe.  This was wasted with second RN Nehemiah Settle) and discarded via Psychologist, educational.  [place] 05/19/2020

## 2020-05-19 NOTE — Progress Notes (Addendum)
RN replaced PCA morphine syringe. 1.5 mL was remaining in previous syringe.  This was wasted with second RN Leotis Shames) and discarded via stericycle container.  [place] 05/18/2020

## 2020-05-19 NOTE — Progress Notes (Signed)
PROGRESS NOTE    Annette [name]  YNW:[id number] DOB: [birth year] DOA: 05/07/2020 PCP: Annette [name], No Pcp Per     Brief Narrative:  59 yr WF with recent COVID-19 about 2 months ago, recent PE on Eliquis, COPD/Asthma not on oxygen, DM-2, HTN and HLD   Admitted to [facility] on 05/03/2020 where she was admitted for RUL MRSA pneumonia and RLL subsegmental PE (TTE with mod PAH but no right heart strain) treated with vancomycin, then Zyvox,  and Lovenox.  She developed right-sidedhydropneumothorax on 11/22 underwent chest tube insertion on 05/06/2020 and transferred to [facility] for CTS evaluation and treatment.   On arrival here, CT chest without contrast with moderately large right pneumothorax, subcutaneous emphysema throughout the right chest wall extending into right neck, pneumomediastinum, right upper lobe opacity and emphysema.  IR consulted by CTS and placed additional chest tube to right chest on 05/08/2020.    On 11/26, chest x-ray with near resolution of pneumothorax but persistent right lung opacity.  IV Unasyn added. Also had significant bleeding around chest tube.  Hemoglobin dropped 3 g.  Heparin held.  Transfused 2 units and stabilized.  CTA chest with new nonocclusive PE in RLL, persistent moderate right-sided pneumothorax with mediastinal shift to the left, new moderate sized right-sided pneumothorax, changed chest tube possibly causing pulmonary laceration and new large right flank hematoma measuring 12 cm without definite evidence of active extravasation, diffuse wall thickening of gallbladder, coarse airspace opacities and pulmonary fibrosis.   On 11/28, Annette [name] underwent VATS/thoracotomy mechanical pleurodesis and wound VAC placement, and sent to ICU out of concern about her respiratory status but remained stable 4 L oxygen by nasal cannula.  On 11/30, transferred out of ICU to progressive unit.  Heparin resumed.  Also started on IV Lasix by  CTS   Subjective: 12/5 afebrile overnight A/O x4, in much better spirits today.  States has been using flutter valve and has been able to cough up significant amounts of sputum.     Assessment & Plan: Covid vaccination; no vaccination (Annette [name] had Covid October)   Principal Problem:   MRSA pneumonia (HCC) Active Problems:   Pulmonary embolism (HCC)   Pneumothorax   Acute hypoxemic respiratory failure (HCC)   COPD (chronic obstructive pulmonary disease) (HCC)   Pressure injury of skin   Right-sided pneumothorax with mediastinal shift: -S/p VATS/thoracotomy mechanical pleurodesis -Flutter valve -[name] -12/3 s/p wound closure and wound VAC removal  Large right flank hematoma measuring about 12 cm- -S/p evacuation and wound VAC  Acute blood loss anemia:  Lab Results  Component Value Date   HGB 8.1 (L) 05/19/2020   HGB 8.6 (L) 05/18/2020   HGB 8.2 (L) 05/17/2020   HGB 7.6 (L) 05/17/2020   HGB 9.1 (L) 05/16/2020  -11/27 transfuse 2 units PRBC -11/28 transfuse 2 units PRBC   Right upper lobe MRSA pneumonia/Aspiration pneumonia:  -Annette [name] has completed antibiotics for MRSA pneumonia. -We will complete course of Unasyn on 12/3 for her aspiration pneumonia. -Negative fever, leukocytosis.  -12/1 PCXR; whole right lung opacified see results below -Continue increased respiratory demand.  Acute RLL subsegmental PE:  -TTE at OSH with moderate PAH but no right heart strain. -Resume heparin.  -11/30 LE venous Doppler.  Negative for DVT  Acute respiratory failure with hypoxia  -Likely due to the above.  Stable on 3 to 4 L by nasal cannula. -Wean oxygen as able.  Minimum oxygen to keep saturation above 88% -Encourage incentive spirometry and flutter valve -Mobilize  DM type  II uncontrolled with hyperglycemia  -11/24 hemoglobin A1c= 10.7 -Continue SSI-moderate -12/4 increase NovoLog 12 units qac  -12/4 increase Lantus 25 units daily -Moderate SSI -Continue home  statin.  Chronic COPD:  -Stable -On Xopenex and Incruse Ellipta now  Hyperlipidemia -Continue Lipitor and fenofibrate  GERD -Continue PPI  Anxiety: Stable -Continue home Klonopin  Neuropathy/chronic pain -Continue home Lyrica  History of COVID-19 infection:  -Treated for this about 2 months ago.  Leukocytosis/bandemia:  -Likely due to #1.  Resolved. -Continue monitoring  Thrombocytopenia:  Results for [name], [name] (MRN [id number]) as of 05/19/2020 10:02  Ref. Range 05/15/2020 02:02 05/16/2020 01:48 05/17/2020 01:10 05/18/2020 02:36 05/19/2020 01:59  Platelets Latest Ref Range: 150 - 400 K/uL 87 (L) 79 (L) 77 (L) 77 (L) 78 (L)  -Stable -Currently no signs of overt bleeding will monitor closely. -No signs of DIC, TTP, ITP monitor closely  Intertrigo/cutaneous candidiasis -Diflucan 150 mg every 72 hours x2 -Topical nystatin powder  Morbid obesity: Body mass index is 37.44 kg/m. Nutrition Problem: Increased nutrient needs Etiology: wound healing, acute illness (recent COVID PNA) Signs/Symptoms: estimated needs Interventions: MVI, Ensure Enlive (each supplement provides 350kcal and 20 grams of protein)   Stage II pressure skin injury: POA Pressure Injury 05/07/20 Coccyx Medial;Mid Stage 2 -  Partial thickness loss of dermis presenting as a shallow open injury with a red, pink wound bed without slough. (Active)  05/07/20 2100  Location: Coccyx  Location Orientation: Medial;Mid  Staging: Stage 2 -  Partial thickness loss of dermis presenting as a shallow open injury with a red, pink wound bed without slough.  Wound Description (Comments):   Present on Admission: Yes   Hypomagnesmia -Magnesium goal> 2 -12/5 Magnesium IV 2 g   Goals of care -Spoke at length with NCM Kristi Annette [name] is a frequent flyer here who is noncompliant refuses SNF.  Will place consult for palliative care -12/2 Palliative Care consult; Annette [name] who is noncompliant, frequent flyer,  multisystem organ failure.  Evaluate DNR, discuss goals of care palliative vs hospice   DVT prophylaxis: Heparin subcu Code Status: Full Family Communication:  Status is: Inpatient    Dispo: The Annette [name] is from: Home              Anticipated d/c is to:??              Anticipated d/c date is: 12/9              Annette [name] currently unstable      Consultants:  IR Cardiothoracic surgery   Procedures/Significant Events:  05/06/2020-right chest tube placement at outside hospital 05/08/2020-second right chest tube placement by IR  05/12/2020-VATS/thoracotomy mechanical pleurodesis and wound VAC placement 11/30 bilateral lower extremity Doppler; negative DVT 12/1 PCXR-extensive airspace consolidation throughout much of the right lung, stable. -Left base atelectasis. No new opacity appreciable. Chest tubes remain on the right, unchanged in position, without pneumothorax. Stable cardiac silhouette. 12/3 RIGHT CHEST WALL WOUND CLOSURE/removal wound VAC by cardiothoracic surgery  I have personally reviewed and interpreted all radiology studies and my findings are as above.  VENTILATOR SETTINGS: Nasal cannula 12/5 Flow; 5 L/min SPO2 98%   Cultures Blood cultures NGTD. MRSA PCR positive.   Antimicrobials: Anti-infectives (From admission, onward)   Start     Ordered Stop   05/10/20 1300  fluconazole (DIFLUCAN) tablet 150 mg        05/10/20 1151 05/13/20 1408   05/10/20 0900  Ampicillin-Sulbactam (UNASYN) 3 g in sodium chloride 0.9 % 100 mL IVPB  05/10/20 0753 05/17/20 0859   05/08/20 0030  linezolid (ZYVOX) tablet 600 mg  Status:  Discontinued        05/08/20 0015 05/14/20 1113       Devices    LINES / TUBES:      Continuous Infusions: . heparin 2,550 Units/hr (05/19/20 0509)     Objective: Vitals:   05/19/20 0400 05/19/20 0700 05/19/20 0844 05/19/20 0954  BP: 105/65  (!) 117/50   Pulse: 88  90   Resp: 17  18 14   Temp: 98.5 F (36.9 C)  98.8 F  (37.1 C)   TempSrc: Oral  Oral   SpO2: 100% 96% 92% 98%  Weight:      Height:        Intake/Output Summary (Last 24 hours) at 05/19/2020 1000 Last data filed at 05/19/2020 [id number] Gross per 24 hour  Intake 1946.43 ml  Output 2930 ml  Net -983.57 ml   Filed Weights   05/16/20 0358 05/17/20 0413 05/18/20 0416  Weight: 112.8 kg 110.5 kg 107.3 kg    Examination:  General: A/O x4, positive acute respiratory distress Eyes: negative scleral hemorrhage, negative anisocoria, negative icterus ENT: Negative Runny nose, negative gingival bleeding, Neck:  Negative scars, masses, torticollis, lymphadenopathy, JVD Lungs: decreased breath sounds bilateral RIGHT >>> LEFT  without wheezes or crackles.  But improved from the last 2 days. Cardiovascular: Regular rate and rhythm without murmur gallop or rub normal S1 and S2.  3 chest tubes present on the RIGHT side draining serosanguineous fluid.  Negative air leak. Abdomen: OBESE, abdominal pain, nondistended, positive soft, bowel sounds, no rebound, no ascites, no appreciable mass Extremities: No significant cyanosis, clubbing, or edema bilateral lower extremities Skin: Negative rashes, lesions, ulcers Psychiatric:  Negative depression, negative anxiety, negative fatigue, negative mania  Central nervous system:  Cranial nerves II through XII intact, tongue/uvula midline, all extremities muscle strength 5/5, sensation intact throughout, negative dysarthria, negative expressive aphasia, negative receptive aphasia.  .     Data Reviewed: Care during the described time interval was provided by me .  I have reviewed this Annette [name]'s available data, including medical history, events of note, physical examination, and all test results as part of my evaluation.  CBC: Recent Labs  Lab 05/15/20 0202 05/15/20 0202 05/16/20 0148 05/17/20 0110 05/17/20 1113 05/18/20 0236 05/19/20 0159  WBC 4.5  --  3.7* 4.6  --  3.8* 4.2  NEUTROABS  --   --  2.5 2.9  --   3.1 2.4  HGB 8.9*   < > 9.1* 7.6* 8.2* 8.6* 8.1*  HCT 28.9*   < > 29.2* 25.2* 26.9* 27.2* 26.5*  MCV 93.5  --  93.0 93.7  --  91.9 94.6  PLT 87*  --  79* 77*  --  77* 78*   < > = values in this interval not displayed.   Basic Metabolic Panel: Recent Labs  Lab 05/15/20 0202 05/15/20 0202 05/16/20 0148 05/17/20 0110 05/18/20 0236 05/18/20 0455 05/19/20 0159  NA 137  --  141 138 138  --  135  K 4.6  --  4.1 3.8 4.7  --  4.6  CL 94*  --  93* 93* 88*  --  86*  CO2 34*  --  38* 37* 39*  --  42*  GLUCOSE 220*   < > 254* 207* 380* 396* 302*  BUN 14  --  13 12 11   --  15  CREATININE 0.62  --  0.66 0.53 0.63  --  0.64  CALCIUM 8.1*  --  8.3* 7.9* 8.7*  --  8.3*  MG 1.6*  --  1.9 1.6* 1.9  --  1.7  PHOS 3.1  --  2.6 1.9* 4.2  --  4.2   < > = values in this interval not displayed.   GFR: Estimated Creatinine Clearance: 96 mL/min (by C-G formula based on SCr of 0.64 mg/dL). Liver Function Tests: Recent Labs  Lab 05/14/20 0415 05/14/20 0415 05/15/20 0202 05/16/20 0148 05/17/20 0110 05/18/20 0236 05/19/20 0159  AST 11*  --   --  13* 11* 19 19  ALT 14  --   --  14 12 14 15   ALKPHOS 42  --   --  53 47 60 58  BILITOT 0.4  --   --  0.5 0.5 0.6 0.8  PROT 4.1*  --   --  4.7* 4.3* 5.0* 4.7*  ALBUMIN 1.3*   < > 1.5* 1.6* 1.5* 1.7* 1.7*   < > = values in this interval not displayed.   No results for input(s): LIPASE, AMYLASE in the last 168 hours. No results for input(s): AMMONIA in the last 168 hours. Coagulation Profile: No results for input(s): INR, PROTIME in the last 168 hours. Cardiac Enzymes: No results for input(s): CKTOTAL, CKMB, CKMBINDEX, TROPONINI in the last 168 hours. BNP (last 3 results) No results for input(s): PROBNP in the last 8760 hours. HbA1C: No results for input(s): HGBA1C in the last 72 hours. CBG: Recent Labs  Lab 05/18/20 1547 05/18/20 2014 05/19/20 0030 05/19/20 0357 05/19/20 0809  GLUCAP 196* 252* 258* 215* 147*   Lipid Profile: No results for  input(s): CHOL, HDL, LDLCALC, TRIG, CHOLHDL, LDLDIRECT in the last 72 hours. Thyroid Function Tests: No results for input(s): TSH, T4TOTAL, FREET4, T3FREE, THYROIDAB in the last 72 hours. Anemia Panel: No results for input(s): VITAMINB12, FOLATE, FERRITIN, TIBC, IRON, RETICCTPCT in the last 72 hours. Sepsis Labs: No results for input(s): PROCALCITON, LATICACIDVEN in the last 168 hours.  Recent Results (from the past 240 hour(s))  Surgical pcr screen     Status: Abnormal   Collection Time: 05/11/20 11:55 AM   Specimen: Nasal Mucosa; Nasal Swab  Result Value Ref Range Status   MRSA, PCR POSITIVE (A) NEGATIVE Final    Comment: RESULT CALLED TO, READ BACK BY AND VERIFIED WITH: RN A TEPE 05/13/20 1453 MLM    Staphylococcus aureus POSITIVE (A) NEGATIVE Final    Comment: (NOTE) The Xpert SA Assay (FDA approved for NASAL specimens in patients 8 years of age and older), is one component of a comprehensive surveillance program. It is not intended to diagnose infection nor to guide or monitor treatment. Performed at [facility], [address] [place], [place]          Radiology Studies: DG CHEST PORT 1 VIEW  Result Date: 05/19/2020 CLINICAL DATA:  Follow-up pneumonia EXAM: PORTABLE CHEST 1 VIEW COMPARISON:  05/18/2020 FINDINGS: Cardiac shadow remains enlarged. Persistent airspace opacity is noted in the right lung in the apex and base. Chest tubes are noted on the right without evidence of pneumothorax. Left lung shows some mild atelectatic changes. IMPRESSION: Stable appearance of the chest. Electronically Signed   By: 14/09/2019 M.D.   On: 05/19/2020 08:12   DG CHEST PORT 1 VIEW  Result Date: 05/18/2020 CLINICAL DATA:  Follow-up pneumothorax EXAM: PORTABLE CHEST 1 VIEW COMPARISON:  05/16/2020 FINDINGS: Cardiac shadow is stable. Three chest tubes are noted on the right. No  pneumothorax is noted. Persistent opacification in the right apex is noted. Left lung is well  aerated. No bony abnormality is noted. IMPRESSION: Stable appearance of the chest when compared with the prior exam. Electronically Signed   By: Alcide Clever M.D.   On: 05/18/2020 10:08        Scheduled Meds: . (feeding supplement) PROSource Plus  30 mL Oral BID BM  . atorvastatin  40 mg Oral Daily  . bisacodyl  10 mg Oral Daily  . Chlorhexidine Gluconate Cloth  6 each Topical Daily  . cholecalciferol  1,000 Units Oral Daily  . clonazePAM  0.5 mg Oral BID  . fenofibrate  160 mg Oral Daily  . furosemide  40 mg Intravenous BID  . Gerhardt's butt cream   Topical BID  . guaiFENesin  600 mg Oral BID  . insulin aspart  0-15 Units Subcutaneous Q4H  . insulin aspart  12 Units Subcutaneous TID WC  . insulin glargine  25 Units Subcutaneous QHS  . lidocaine  1 patch Transdermal Q24H  . morphine   Intravenous Q4H  . multivitamin with minerals  1 tablet Oral Daily  . nystatin   Topical BID  . pregabalin  150 mg Oral TID  . senna-docusate  1 tablet Oral QHS  . umeclidinium bromide  1 puff Inhalation Daily   Continuous Infusions: . heparin 2,550 Units/hr (05/19/20 0509)     LOS: 12 days    Time spent:40 min    Ole Lafon, Roselind Messier, MD [facility] Pager [phone]  If 7PM-7AM, please contact night-coverage [email or web address] Password [email or web address] 05/19/2020, 10:00 AM

## 2020-05-19 NOTE — Progress Notes (Addendum)
Palliative Medicine Inpatient Follow Up Note   Reason for consult:  Goals of Care "patient who is noncompliant, frequent flyer, multisystem organ failure.  Evaluate DNR, discuss goals of care palliative vs hospice"  HPI:  Per intake H&P --> 59 yr WF withrecent COVID-19about 2 months ago, recent PE on Eliquis, COPD/Asthma not on oxygen, DM-2, HTN and HLD. Admitted for RUL MRSA pneumonia and RLL subsegmental PE(TTE with mod PAH but no right heart strain) treated with vancomycin, then Zyvox, and Lovenox. She developed right-sidedhydropneumothorax on 11/22 underwent chest tube insertion on 05/06/2020 and transferred to [facility] for CTS evaluation and treatment.   Palliative care was asked to get involved to further discuss goals of care.  Today's Discussion (05/19/2020): Chart reviewed.  I met with Annette [name] at bedside this afternoon.  She was this afternoon able to provide me with a phone number to hospice and palliative care of [facility].  I have placed a call to their on-call physician to get a better impression as to the services she was receiving under palliative care.  Bebe shares with me that the plan from the cardiothoracic's medical team is to ideally start removing her chest tubes as early as tomorrow and observing for her tolerance when they are removed.  Annette [name] expresses to me that she is incredibly weak and is very worried about going to her home setting.  We discussed that she will likely not be safe to go home and would truly benefit from skilled nursing for rehabilitation purposes when she leaves the hospital.  We discussed Annette [name]'s pain regiment which at home consists of only Lyrica though she is getting morphine by PCA and has been on it for a number of days now.  We discussed a plan of as she is titrating off of it starting MS Contin as well as orals for breakthrough pain.  Annette [name] shares that her pain is in her back and throughout her whole body and is fairly consistent.   She endorses that the Lyrica does help though at home she uses Klonopin as an adjunct and perhaps uses more than is prescribed.  We discussed that if her pain is under appropriate control then her anxiety will also be under better control and she can use her Klonopin twice daily as it should be utilized.  Provided ongoing education regarding the use of opioid medications there benefits and their downfalls.  Prima is aware of these.  She shares that her hope was to get her symptoms under good management and she now has fear that after the removal of her chest tube she will be in worse pain than ever.  We will continue to help guide her through this difficult time and manage her pain as she is tapering off of the PCA.  Objective Assessment: Vital Signs Vitals:   05/19/20 1050 05/19/20 1157  BP:  (!) 98/59  [name]:  (!) 102  Resp: 17 20  Temp:  98.7 F (37.1 C)  SpO2: 100% 94%    Intake/Output Summary (Last 24 hours) at 05/19/2020 1524 Last data filed at 05/19/2020 1154 Gross per 24 hour  Intake 1166.43 ml  Output 3430 ml  Net -2263.57 ml   Last Weight  Most recent update: 05/18/2020  4:44 AM   Weight  107.3 kg (236 lb 8.9 oz)           SUMMARY OF RECOMMENDATIONS DNAR/DNI  MOST Completed, paper copy placed onto the chart electric copy can be found in [place]  DNR  Form Completed, paper copy placed onto the chart electric copy can be found in [facility]  TOC - Patient receives outpatient palliative care through "[facility]"  When primary medical team is ready to transition patient off of morphine PCA would recommend accounting for 24-hour needs and placing on extended release morphine with immediate release oral morphine every 3-4 hours for breakthrough pain.  We will help with this transition  Anxiety - continue klonopin at present dosing  PT/OT for generalized weakness  Time Spent: 35 Greater than 50% of the time was spent in counseling and  coordination of care ______________________________________________________________________________________ [place] Team Team Cell Phone: [phone] Please utilize secure chat with additional questions, if there is no response within 30 minutes please call the above phone number  Palliative Medicine Team providers are available by phone from 7am to 7pm daily and can be reached through the team cell phone.  Should this patient require assistance outside of these hours, please call the patient's attending physician.

## 2020-05-19 NOTE — Progress Notes (Signed)
ANTICOAGULATION CONSULT NOTE  Pharmacy Consult for Heparin  Indication: pulmonary embolus  Allergies  Allergen Reactions  . Cymbalta [Duloxetine Hcl]     Psychosis "self-harm"   Patient Measurements: Height: 5\' 8"  (172.7 cm) Weight: 107.3 kg (236 lb 8.9 oz) IBW/kg (Calculated) : 63.9  Heparin dosing wt: 90 kg  Vital Signs: Temp: 98.8 F (37.1 C) (12/05 0844) Temp Source: Oral (12/05 0844) BP: 117/50 (12/05 0844) Pulse Rate: 90 (12/05 0844)  Assessment: 59 y/o F transfer from [facility]. Pt has PE and had been on Eliquis recently (last dose not clear) and was transferred on heparin. Pt is s/p chest tube placement and IV heparin was resumed for PE on 11/24. On 11/26, Hgb dropped from 11 to 7.7. CTA showed new right-sided hemothorax. Heparin was subsequently held. Restarted on 11/30.  Heparin held again 12/3 for chest wall closure.  Restarted 12/4.  Heparin remains therapeutic on 2550 units/hr. Hgb stable 8s post-op and PLTC low but stable in 70s.  Goal of Therapy:  Heparin level 0.3-0.7 units/ml  Monitor platelets by anticoagulation protocol: Yes   Plan:  Continue heparin infusion at 2550 units/hr Monitor daily heparin level, CBC, and for s/sx of bleeding  14/4, Pharm.D., BCPS Clinical Pharmacist  **Pharmacist phone directory can be found on [email or web address] listed under [facility].  05/19/2020 9:25 AM

## 2020-05-19 NOTE — Op Note (Signed)
Procedure(s): RIGHT CHEST WALL WOUND CLOSURE REMOVAL OF WOUND VAC Procedure Note  Annette [name] female 59 y.o. 05/19/2020  Procedure(s) and Anesthesia Type:    * RIGHT CHEST WALL WOUND CLOSURE REMOVAL OF WOUND VAC - General  Surgeon(s) and Role:    * [name], MD - Primary   Indications: The patient is status post right VATS evacuation of right hemothorax with a coincidental removal of a chest tube from the right side which had been placed at an outside hospital.  She had developed a large hematoma around the site.  This is been wound VAC for several days and she now presents for closure     Surgeon: [name]   Assistants: Staff  Anesthesia: General endotracheal anesthesia  ASA Class: 3    Procedure Detail  RIGHT CHEST WALL WOUND CLOSURE REMOVAL OF WOUND VAC After informed consent, the patient was brought to the operating room on the above listed date.  She is placed in the supine position on the operating table.  Anesthesia was confirmed with a smooth fashion by the general endotracheal technique.  Patient was placed in the right side up lateral decubitus position.  The wound VAC dressing was removed.  The right chest was cleansed and draped in a sterile field using Betadine solution.  A preop surgical pause was performed.  The wound was irrigated copiously.  Any devitalized tissue was removed.  Mass approximation of the wound was then performed with #2 nylon sutures placed in a vertical mattress fashion.  The skin was closed with staples.  Sterile dressings were applied.  All sponge instrument needle counts were correct.  Estimated Blood Loss:  Minimal         Drains: None        Blood Given: none          Specimens: None         Implants: none        Complications:  * No complications entered in OR log *         Disposition: ICU - extubated and stable.         Condition: stable

## 2020-05-19 NOTE — Progress Notes (Addendum)
      [address]       [place] [address]             [phone]       2 Days Post-Op Procedure(s) (LRB): RIGHT CHEST WALL WOUND CLOSURE REMOVAL OF WOUND VAC (Right)  Subjective: Patient states her breathing seems good this am. She is glad chest tube output has decreased and hopes they get "removed soon."  Objective: Vital signs in last 24 hours: Temp:  [98.3 F (36.8 C)-98.5 F (36.9 C)] 98.5 F (36.9 C) (12/05 0400) Pulse Rate:  [71-92] 88 (12/05 0400) Cardiac Rhythm: Sinus tachycardia (12/04 1900) Resp:  [15-19] 17 (12/05 0400) BP: (105-109)/(64-72) 105/65 (12/05 0400) SpO2:  [95 %-100 %] 100 % (12/05 0400) FiO2 (%):  [98 %] 98 % (12/04 2218)     Intake/Output from previous day: 12/04 0701 - 12/05 0700 In: 2186.4 [P.O.:2020; I.V.:166.4] Out: 3580 [Urine:3550; Chest Tube:30]   Physical Exam:  Cardiovascular: RRR Pulmonary:Squeak and rub with chest tubes in place on the right;clear on the left Abdomen: Soft, non tender, bowel sounds present. Wounds: Dressings mostly clean and dry Chest Tubes:to suction, no air leak  Lab Results: CBC: Recent Labs    05/18/20 0236 05/19/20 0159  WBC 3.8* 4.2  HGB 8.6* 8.1*  HCT 27.2* 26.5*  PLT 77* 78*   BMET:  Recent Labs    05/18/20 0236 05/18/20 0236 05/18/20 0455 05/19/20 0159  NA 138  --   --  135  K 4.7  --   --  4.6  CL 88*  --   --  86*  CO2 39*  --   --  42*  GLUCOSE 380*   < > 396* 302*  BUN 11  --   --  15  CREATININE 0.63  --   --  0.64  CALCIUM 8.7*  --   --  8.3*   < > = values in this interval not displayed.    PT/INR: No results for input(s): LABPROT, INR in the last 72 hours. ABG:  INR: Will add last result for INR, ABG once components are confirmed Will add last 4 CBG results once components are confirmed  Assessment/Plan:  1. CV - SR 2.  Pulmonary - History of COPD. On 4-5 liters of oxygen via . Wean as able. 3 chest tubes and output recorded as 30 cc for last 24  hours. CXR this am appears stable. Hope to begin removing chest tubes in am. Encourage incentive spirometer 3. PE-on Heparin drip 4. DM-CBGs 252/258/215. On Insulin;per primary 5. Anemia-H and H this am increased to 8.1 and 26.5 6. Thrombocytopenia-platelets this am 78,000. Per primary 7. MRSA pneumonia-on Cefazolin  Donielle M ZimmermanPA-C 05/19/2020,7:21 AM [phone]   I have seen and examined the patient and agree with the assessment and plan as outlined.  Purcell Nails, MD 05/19/2020 2:21 PM

## 2020-05-20 ENCOUNTER — Inpatient Hospital Stay (HOSPITAL_COMMUNITY): Payer: Medicare Other

## 2020-05-20 LAB — COMPREHENSIVE METABOLIC PANEL
ALT: 15 U/L (ref 0–44)
AST: 17 U/L (ref 15–41)
Albumin: 1.8 g/dL — ABNORMAL LOW (ref 3.5–5.0)
Alkaline Phosphatase: 58 U/L (ref 38–126)
Anion gap: 8 (ref 5–15)
BUN: 15 mg/dL (ref 6–20)
CO2: 41 mmol/L — ABNORMAL HIGH (ref 22–32)
Calcium: 8.4 mg/dL — ABNORMAL LOW (ref 8.9–10.3)
Chloride: 89 mmol/L — ABNORMAL LOW (ref 98–111)
Creatinine, Ser: 0.77 mg/dL (ref 0.44–1.00)
GFR, Estimated: >60 mL/min (ref >60)
Glucose, Bld: 185 mg/dL — ABNORMAL HIGH (ref 70–99)
Potassium: 4.7 mmol/L (ref 3.5–5.1)
Sodium: 138 mmol/L (ref 135–145)
Total Bilirubin: 0.8 mg/dL (ref 0.3–1.2)
Total Protein: 4.7 g/dL — ABNORMAL LOW (ref 6.5–8.1)

## 2020-05-20 LAB — CBC WITH DIFFERENTIAL/PLATELET
Abs Immature Granulocytes: 0.08 10*3/uL — ABNORMAL HIGH (ref 0.00–0.07)
Basophils Absolute: 0 10*3/uL (ref 0.0–0.1)
Basophils Relative: 0 %
Eosinophils Absolute: 0.1 10*3/uL (ref 0.0–0.5)
Eosinophils Relative: 3 %
HCT: 26.7 % — ABNORMAL LOW (ref 36.0–46.0)
Hemoglobin: 8 g/dL — ABNORMAL LOW (ref 12.0–15.0)
Immature Granulocytes: 2 %
Lymphocytes Relative: 29 %
Lymphs Abs: 1.1 10*3/uL (ref 0.7–4.0)
MCH: 28.8 pg (ref 26.0–34.0)
MCHC: 30 g/dL (ref 30.0–36.0)
MCV: 96 fL (ref 80.0–100.0)
Monocytes Absolute: 0.4 10*3/uL (ref 0.1–1.0)
Monocytes Relative: 11 %
Neutro Abs: 2.1 10*3/uL (ref 1.7–7.7)
Neutrophils Relative %: 55 %
Platelets: 97 10*3/uL — ABNORMAL LOW (ref 150–400)
RBC: 2.78 MIL/uL — ABNORMAL LOW (ref 3.87–5.11)
RDW: 19.4 % — ABNORMAL HIGH (ref 11.5–15.5)
WBC: 3.8 10*3/uL — ABNORMAL LOW (ref 4.0–10.5)
nRBC: 0.5 % — ABNORMAL HIGH (ref 0.0–0.2)

## 2020-05-20 LAB — MAGNESIUM: Magnesium: 1.9 mg/dL (ref 1.7–2.4)

## 2020-05-20 LAB — GLUCOSE, CAPILLARY
Glucose-Capillary: 201 mg/dL — ABNORMAL HIGH (ref 70–99)
Glucose-Capillary: 203 mg/dL — ABNORMAL HIGH (ref 70–99)
Glucose-Capillary: 224 mg/dL — ABNORMAL HIGH (ref 70–99)
Glucose-Capillary: 225 mg/dL — ABNORMAL HIGH (ref 70–99)
Glucose-Capillary: 252 mg/dL — ABNORMAL HIGH (ref 70–99)
Glucose-Capillary: 253 mg/dL — ABNORMAL HIGH (ref 70–99)
Glucose-Capillary: 281 mg/dL — ABNORMAL HIGH (ref 70–99)
Glucose-Capillary: 316 mg/dL — ABNORMAL HIGH (ref 70–99)

## 2020-05-20 LAB — PHOSPHORUS: Phosphorus: 4.2 mg/dL (ref 2.5–4.6)

## 2020-05-20 LAB — HEPARIN LEVEL (UNFRACTIONATED)
Heparin Unfractionated: 0.1 IU/mL — ABNORMAL LOW (ref 0.30–0.70)
Heparin Unfractionated: 0.39 IU/mL (ref 0.30–0.70)

## 2020-05-20 MED ORDER — MORPHINE SULFATE 1 MG/ML IV SOLN PCA: INTRAVENOUS | Status: DC

## 2020-05-20 MED ORDER — MORPHINE SULFATE (PF) 2 MG/ML IV SOLN
2.0000 mg | Freq: Once | INTRAVENOUS | Status: AC
  Administered 2020-05-20: 2 mg via INTRAVENOUS
  Filled 2020-05-20: qty 1

## 2020-05-20 MED ORDER — INSULIN GLARGINE 100 UNIT/ML ~~LOC~~ SOLN
30.0000 [IU] | Freq: Every day | SUBCUTANEOUS | Status: DC
  Administered 2020-05-20 – 2020-05-21 (×2): 30 [IU] via SUBCUTANEOUS
  Filled 2020-05-20 (×4): qty 0.3

## 2020-05-20 MED ORDER — ONDANSETRON HCL 4 MG/2ML IJ SOLN
4.0000 mg | Freq: Four times a day (QID) | INTRAMUSCULAR | Status: DC | PRN
  Filled 2020-05-20: qty 2

## 2020-05-20 NOTE — Progress Notes (Signed)
PROGRESS NOTE   Annette [name]  VQM:[id number] DOB: 01-19-1961 DOA: 05/07/2020 PCP: Patient, No Pcp Per  Brief Narrative:   59 year old white female coronavirus infection October 2021, recent PE on Eliquis COPD asthma stage I T2 DM TY 2 HTN HLD Admission to [facility] on 05/03/2020 right upper lobe MRSA pneumonia right lower lobe subsegmental PE (no heart strain)-Rx vancomycin-->Zyvox + Lovenox Developed right-sided hydropneumothorax 11/22, right chest tube 11/22 and transferred for CVTS involvement Found to have large right pneumothorax subcutaneous emphysema pneumomediastinum additional chest tube placed 11/24- This was complicated by bleeding around chest tube hemoglobin dropped, patient transfused 2 units CT chest showed new nonocclusive PE RLL right-sided pneumothorax mediastinal shift--large right flank hematoma was found in addition Patient underwent VATS with thoracotomy 11/28 and was transferred out of ICU to progressive unit  She has steadily improved and chest tube was pulle don 12/6  planning for SNF   Assessment & Plan:   Principal Problem:   MRSA pneumonia (HCC) Active Problems:   Pulmonary embolism (HCC)   Pneumothorax   Acute hypoxemic respiratory failure (HCC)   COPD (chronic obstructive pulmonary disease) (HCC)   Pressure injury of skin   Palliative care by specialist   Goals of care, counseling/discussion   DNR (do not resuscitate)   1. Right-sided pneumothorax a. Chest tubes out today per CVTS b. Pain control per CVTS 2. Anemia of acute blood loss secondary to left flank hematoma status post evacuation by CVTS a. Post surgical care and follow-up per CVTS b. Hemoglobin stable 8 range-transfusion threshold 7 c. transfused 2 u 11/27 and 11/28-total 4 d. Monitor trends in am e. Further post-surgical plans per CVTS  f. Check iron studies and supplement 3. Right upper lobe MRSA with aspiration pneumonia a. Abx completed 12/02 after 10 days  total abx b. Monitor trends and follow cxr from 12/7 am 4. Acute RLL subsegmental PE a. continues on IV heparin b. TTE neg for Heart strain c. Transition to DOAC probably post cxr 12/7 5. DM TY 2 A1c 10.7 a. cbg 200-316 b. Increase lantus from 25-30 units and Mod SSI with 12 U mealtime insulin 6. Chronic COPD a. Cont Levalbuterol, incruse mucinex 7. Anxiety and neuropathy a. Cont klonopin 0.5 bid, lyrica 150 tid 8. Recent treatment COVID-19 9. Thrombocytopenia 10. Stage II pressure ulcer from prior to admission 11. BMI 37  DVT prophylaxis: heparin Code Status: full Family Communication: spoke with son Trey Paula [phone] Disposition:  Status is: Inpatient  Remains inpatient appropriate because:Persistent severe electrolyte disturbances and Ongoing diagnostic testing needed not appropriate for outpatient work up   Dispo: The patient is from: Home              Anticipated d/c is to: SNF              Anticipated d/c date is: > 3 days              Patient currently is not medically stable to d/c.       Consultants:   cvts  Procedures:  none  Antimicrobials: n    Subjective: Awake coherent-not OOB much recently No cp no fever no chills no n/v   Objective: Vitals:   05/19/20 1945 05/19/20 2111 05/19/20 2338 05/20/20 0300  BP: 118/63  (!) 100/58 116/69  Pulse: 99  99 100  Resp: 18 19 18 20   Temp: 99 F (37.2 C)  98.4 F (36.9 C) 98.3 F (36.8 C)  TempSrc: Oral  Oral Oral  SpO2:  95% 94% 95% 96%  Weight:    106.2 kg  Height:        Intake/Output Summary (Last 24 hours) at 05/20/2020 0723 Last data filed at 05/20/2020 0241 Gross per 24 hour  Intake 917.61 ml  Output 1900 ml  Net -982.39 ml   Filed Weights   05/17/20 0413 05/18/20 0416 05/20/20 0300  Weight: 110.5 kg 107.3 kg 106.2 kg    Examination:  eomi ncat no ict no pallor cta b no added sound --Hematoma to the R chest posteriorly with dressing on top abd soft nt nd no rebound no guard No le  edema Moving limbs x 4 equally On oxygen   Data Reviewed: I have personally reviewed following labs and imaging studies BUN/creatinine 15/0.77 CO2 41 chloride 89 Albumin 1.8 WBC 3.8 hemoglobin 8.0 platelet 97   Radiology Studies: DG CHEST PORT 1 VIEW  Result Date: 05/19/2020 CLINICAL DATA:  Follow-up pneumonia EXAM: PORTABLE CHEST 1 VIEW COMPARISON:  05/18/2020 FINDINGS: Cardiac shadow remains enlarged. Persistent airspace opacity is noted in the right lung in the apex and base. Chest tubes are noted on the right without evidence of pneumothorax. Left lung shows some mild atelectatic changes. IMPRESSION: Stable appearance of the chest. Electronically Signed   By: Alcide Clever M.D.   On: 05/19/2020 08:12   DG CHEST PORT 1 VIEW  Result Date: 05/18/2020 CLINICAL DATA:  Follow-up pneumothorax EXAM: PORTABLE CHEST 1 VIEW COMPARISON:  05/16/2020 FINDINGS: Cardiac shadow is stable. Three chest tubes are noted on the right. No pneumothorax is noted. Persistent opacification in the right apex is noted. Left lung is well aerated. No bony abnormality is noted. IMPRESSION: Stable appearance of the chest when compared with the prior exam. Electronically Signed   By: Alcide Clever M.D.   On: 05/18/2020 10:08     Scheduled Meds: . (feeding supplement) PROSource Plus  30 mL Oral BID BM  . atorvastatin  40 mg Oral Daily  . bisacodyl  10 mg Oral Daily  . Chlorhexidine Gluconate Cloth  6 each Topical Daily  . cholecalciferol  1,000 Units Oral Daily  . clonazePAM  0.5 mg Oral BID  . fenofibrate  160 mg Oral Daily  . furosemide  40 mg Intravenous BID  . Gerhardt's butt cream   Topical BID  . guaiFENesin  600 mg Oral BID  . insulin aspart  0-15 Units Subcutaneous Q4H  . insulin aspart  12 Units Subcutaneous TID WC  . insulin glargine  30 Units Subcutaneous QHS  . lidocaine  1 patch Transdermal Q24H  . multivitamin with minerals  1 tablet Oral Daily  . nystatin   Topical BID  . pregabalin  150 mg Oral  TID  . senna-docusate  1 tablet Oral QHS  . umeclidinium bromide  1 puff Inhalation Daily   Continuous Infusions: . heparin 2,550 Units/hr (05/20/20 0243)     LOS: 13 days    Time spent: 35  Rhetta Mura, MD [facility] To contact the attending provider between 7A-7P or the covering provider during after hours 7P-7A, please log into the web site [email or web address] and access using universal [place] password for that web site. If you do not have the password, please call the hospital operator.  05/20/2020, 7:23 AM

## 2020-05-20 NOTE — Progress Notes (Signed)
ANTICOAGULATION CONSULT NOTE  Pharmacy Consult for Heparin  Indication: pulmonary embolus  Allergies  Allergen Reactions  . Cymbalta [Duloxetine Hcl]     Psychosis "self-harm"   Patient Measurements: Height: 5\' 8"  (172.7 cm) Weight: 106.2 kg (234 lb 2.1 oz) IBW/kg (Calculated) : 63.9  Heparin dosing wt: 90 kg  Vital Signs: Temp: 98.3 F (36.8 C) (12/06 0300) Temp Source: Oral (12/06 0300) BP: 116/69 (12/06 0300) Pulse Rate: 100 (12/06 0300)  Assessment: 59 y/o F transfer from [facility]. Pt has PE and had been on Eliquis recently (last dose not clear) and was transferred on heparin. Pt is s/p chest tube placement and IV heparin was resumed for PE on 11/24. On 11/26, Hgb dropped from 11 to 7.7. CTA showed new right-sided hemothorax. Heparin was subsequently held. Restarted on 11/30.  Heparin held again 12/3 for chest wall closure.  Restarted 12/4.  Heparin remains therapeutic on 2550 units/hr. Hgb stable 8s post-op and PLTC low but stable in 70s.  12/6 AM update:  Heparin level low  Goal of Therapy:  Heparin level 0.3-0.7 units/ml  Monitor platelets by anticoagulation protocol: Yes   Plan:  Inc heparin infusion to 2650 units/hr Re-check heparin level at 1300 Monitor daily heparin level, CBC, and for s/sx of bleeding  14/6, PharmD, BCPS Clinical Pharmacist Phone: [phone]

## 2020-05-20 NOTE — Progress Notes (Signed)
ANTICOAGULATION CONSULT NOTE  Pharmacy Consult for Heparin  Indication: pulmonary embolus  Allergies  Allergen Reactions  . Cymbalta [Duloxetine Hcl]     Psychosis "self-harm"   Patient Measurements: Height: 5\' 8"  (172.7 cm) Weight: 106.2 kg (234 lb 2.1 oz) IBW/kg (Calculated) : 63.9  Heparin dosing wt: 90 kg  Vital Signs: Temp: 98 F (36.7 C) (12/06 0826) Temp Source: Oral (12/06 0826) BP: 118/68 (12/06 0826) Pulse Rate: 98 (12/06 [birth year]-02-1986)  Assessment: 58 y/o F transfer from [facility]. Pt has PE and had been on Eliquis recently (last dose not clear) and was transferred on heparin. Pt is s/p chest tube placement and IV heparin was resumed for PE on 11/24. On 11/26, Hgb dropped from 11 to 7.7. CTA showed new right-sided hemothorax. Heparin was subsequently held. Restarted on 11/30.  Heparin held again 12/3 for chest wall closure.  Restarted 12/4.  Heparin remains therapeutic on 2650 units/hr. Hgb stable 8s post-op and PLTC improving = 97.   Goal of Therapy:  Heparin level 0.3-0.7 units/ml  Monitor platelets by anticoagulation protocol: Yes   Plan:  Continue IV heparin infusion to 2650 units/hr Monitor daily heparin level, CBC, and for s/sx of bleeding  14/4, Reece Leader, BCCP Clinical Pharmacist  05/20/2020 3:31 PM   [facility] pharmacy phone numbers are listed on [email or web address]

## 2020-05-20 NOTE — Progress Notes (Addendum)
Physical Therapy Treatment Patient Details Name: Annette [name] MRN: [id number] DOB: 05-28-1961 Today's Date: 05/20/2020    History of Present Illness 59 yo female admitted to [facility] on 05/03/2020 with R upper lobe MRSA pneumonia and R lower lobe subsegmental PE. Developed right-sided hydropneumothorax on 11/22 underwent chest tube insertion and on 05/06/2020 transfered to [facility] for CTS evaluation and treatment. Additional R chest tube placement 11/24.  On 11/28, patient underwent VATS/thoracotomy mechanical pleurodesis and wound VAC placement. PMH including Covid pneumonia approximately 2 months ago, recent PE on Eliquis, COPD, asthma, depression, hypertension, and diabetes.    PT Comments    Pt supine in bed on arrival.  The last time she mobilized with PT was Wednesday last week and she is noticeably weaker and required +2 mod assistance this session to come to standing.  Based on functional decline she would benefit from short term rehab in the SNF setting.  Will inform supervising PT of need for change in recommendations at this time.     Follow Up Recommendations  Supervision/Assistance - 24 hour;SNF     Equipment Recommendations  3in1 (PT)    Recommendations for Other Services       Precautions / Restrictions Precautions Precautions: Fall Precaution Comments: Chest tube, VAC Restrictions Weight Bearing Restrictions: No    Mobility  Bed Mobility Overal bed mobility: Needs Assistance Bed Mobility: Supine to Sit;Sit to Supine     Supine to sit: Mod assist;+2 for physical assistance Sit to supine: Mod assist;+2 for physical assistance   General bed mobility comments: Pt required +2 assistance to move to edge of bed this session.  Assistance provided to move B LEs together as she cannot abduct LEs without pain and assistance to elevate trunk into sitting at edge of bed.  Once in sitting use of bed pad to scoot to edge of bed.  Transfers Overall  transfer level: Needs assistance Equipment used: 2 person hand held assist Transfers: Sit to/from Stand Sit to Stand: Mod assist;+2 physical assistance         General transfer comment: Heavy mod +2 to rise into standing from edge of bed x 3 reps.  Pt is significantly weaker than last session.  On third trial performed shuffling steps to [facility].  Ambulation/Gait                 Stairs             Wheelchair Mobility    Modified Rankin (Stroke Patients Only)       Balance Overall balance assessment: Needs assistance Sitting-balance support: No upper extremity supported Sitting balance-Leahy Scale: Fair       Standing balance-Leahy Scale: Poor                              Cognition Arousal/Alertness: Awake/alert Behavior During Therapy: WFL for tasks assessed/performed Overall Cognitive Status: Within Functional Limits for tasks assessed                                        Exercises      General Comments        Pertinent Vitals/Pain Pain Assessment: Faces Faces Pain Scale: Hurts little more Pain Location: neuropathy, back, and R chest tube site Pain Descriptors / Indicators: Burning Pain Intervention(s): Monitored during session;Repositioned    Home Living  Prior Function            PT Goals (current goals can now be found in the care plan section) Acute Rehab PT Goals Patient Stated Goal: to get some rehab and get stronger Potential to Achieve Goals: Fair Progress towards PT goals: Progressing toward goals    Frequency    Min 2X/week      PT Plan Discharge plan needs to be updated    Co-evaluation              AM-PAC PT "6 Clicks" Mobility   Outcome Measure  Help needed turning from your back to your side while in a flat bed without using bedrails?: A Lot Help needed moving from lying on your back to sitting on the side of a flat bed without using bedrails?: A  Lot Help needed moving to and from a bed to a chair (including a wheelchair)?: A Lot Help needed standing up from a chair using your arms (e.g., wheelchair or bedside chair)?: A Lot Help needed to walk in hospital room?: A Lot Help needed climbing 3-5 steps with a railing? : Total 6 Click Score: 11    End of Session Equipment Utilized During Treatment: Oxygen Activity Tolerance: Patient limited by fatigue Patient left: in chair;with call bell/phone within reach;with chair alarm set Nurse Communication: Mobility status PT Visit Diagnosis: Muscle weakness (generalized) (M62.81)     Time: 0932-6712 PT Time Calculation (min) (ACUTE ONLY): 16 min  Charges:  $Therapeutic Activity: 8-22 mins                     Annette [name] , PTA Acute Rehabilitation Services Pager [phone] Office [phone]     Annette [name] 05/20/2020, 3:28 PM

## 2020-05-20 NOTE — Progress Notes (Signed)
      [address]       Annette [name] [address]             [phone]      Discussion about narcotics this morning. PCA pump discontinued. Patient is to receive a dose of IV morphine right before chest tubes are pulled and then only oxycodone thereafter. We discussed that the morphine PCA won't provide any long-term pain relief and it is a quick onset, offset medication. Oxycodone which the patient has not been taking will provide pain relief over a more extended period of time. The patient has agreed to try the Oxycodone and is asking for the 10mg  dose.   Plan: [name] to pull chest tubes later this morning. Will plan to give morphine dose before chest tube pull. Oxycodone thereafter with no more PCA pump.

## 2020-05-20 NOTE — Progress Notes (Signed)
PCA pump discontinued per order.   Morphine 9 cc's wasted in stericycle x 2 nurses.   Amanda/Chastidy Ranker

## 2020-05-20 NOTE — Progress Notes (Signed)
Inpatient Diabetes Program Recommendations  AACE/ADA: New Consensus Statement on Inpatient Glycemic Control   Target Ranges:  Prepandial:   less than 140 mg/dL      Peak postprandial:   less than 180 mg/dL (1-2 hours)      Critically ill patients:  140 - 180 mg/dL   Results for [facility] (MRN [id number]) as of 05/20/2020 10:57  Ref. Range 05/19/2020 08:09 05/19/2020 11:50 05/19/2020 15:44 05/19/2020 20:28 05/20/2020 00:19 05/20/2020 04:17 05/20/2020 08:24  Glucose-Capillary Latest Ref Range: 70 - 99 mg/dL 295 (H) 284 (H) 132 (H) 211 (H) 201 (H) 203 (H) 316 (H)   Review of Glycemic Control  Current orders for Inpatient glycemic control: Lantus 25 units QHS, Novolog 12 units TID with meals, Novolog 0-15 units Q4H  Inpatient Diabetes Program Recommendations:    Insulin: Please consider increasing Lantus to 30 units QHS.  Thanks, Orlando Penner, RN, MSN, CDE Diabetes Coordinator Inpatient Diabetes Program [phone] (Team Pager from 8am to 5pm)

## 2020-05-20 NOTE — Progress Notes (Signed)
      [address]       [place] [address]             [phone]      3 Days Post-Op Procedure(s) (LRB): RIGHT CHEST WALL WOUND CLOSURE REMOVAL OF WOUND VAC (Right) Subjective:  Sitting up eating breakfast. Feels about the same, says she is continuing to have a productive cough.  She is afebrile.   Objective: Vital signs in last 24 hours: Temp:  [98.3 F (36.8 C)-99 F (37.2 C)] 98.3 F (36.8 C) (12/06 0300) Pulse Rate:  [90-102] 100 (12/06 0300) Cardiac Rhythm: Normal sinus rhythm (12/06 0701) Resp:  [14-20] 20 (12/06 0300) BP: (98-118)/(50-69) 116/69 (12/06 0300) SpO2:  [92 %-100 %] 96 % (12/06 0300) FiO2 (%):  [94 %] 94 % (12/05 2111) Weight:  [106.2 kg] 106.2 kg (12/06 0300)    Intake/Output from previous day: 12/05 0701 - 12/06 0700 In: 917.6 [I.V.:841.2; IV Piggyback:76.4] Out: 1900 [Urine:1900] Intake/Output this shift: No intake/output data recorded.  General appearance: alert, cooperative and no distress Neurologic: intact Lungs: Coarse, wet cough. No air leak. Minimal CT drainage for several days.  CXR is stable, sligh improvement in aeration of the right lung since last week.  Wound: The right chest wound closure dressing is dry.   Lab Results: Recent Labs    05/19/20 0159 05/20/20 0246  WBC 4.2 3.8*  HGB 8.1* 8.0*  HCT 26.5* 26.7*  PLT 78* 97*   BMET:  Recent Labs    05/19/20 0159 05/20/20 0246  NA 135 138  K 4.6 4.7  CL 86* 89*  CO2 42* 41*  GLUCOSE 302* 185*  BUN 15 15  CREATININE 0.64 0.77  CALCIUM 8.3* 8.4*    PT/INR: No results for input(s): LABPROT, INR in the last 72 hours. ABG    Component Value Date/Time   PHART 7.400 05/12/2020 1524   HCO3 36.1 (H) 05/12/2020 1524   TCO2 38 (H) 05/12/2020 1524   O2SAT 99.0 05/12/2020 1524   CBG (last 3)  Recent Labs    05/19/20 2028 05/20/20 0019 05/20/20 0417  GLUCAP 211* 201* 203*    Assessment/Plan: S/P Procedure(s) (LRB): RIGHT CHEST WALL WOUND CLOSURE  REMOVAL OF WOUND VAC (Right)  -POD-8 right VATS / mechanical pleurodesis with evacuation of a large chest wall hematoma post chest tube placement at OSH, POD3 closure of right chest wall wound after treatment with a wound vac x 5 days.  Minimal CT drainage over past 3 days and no air leak. Will remove the chest tubes today. CXR in AM.     LOS: 13 days    Leary Roca, PA-C [phone] 05/20/2020

## 2020-05-20 NOTE — Progress Notes (Signed)
Chest tubes x 3 pulled without difficulty.  Patient denies SOB.  States pain in side is better.  Premedicated per request.  O2 saturation 94% via [place]@4L  [place] noted 1390 output at this time.     All sutures removed besides.  Covered with dry dressing.  PA & nurse present.

## 2020-05-21 ENCOUNTER — Inpatient Hospital Stay (HOSPITAL_COMMUNITY): Payer: Medicare Other

## 2020-05-21 DIAGNOSIS — J95811 Postprocedural pneumothorax: Secondary | ICD-10-CM

## 2020-05-21 LAB — COMPREHENSIVE METABOLIC PANEL
ALT: 14 U/L (ref 0–44)
AST: 13 U/L — ABNORMAL LOW (ref 15–41)
Albumin: 1.9 g/dL — ABNORMAL LOW (ref 3.5–5.0)
Alkaline Phosphatase: 60 U/L (ref 38–126)
Anion gap: 10 (ref 5–15)
BUN: 15 mg/dL (ref 6–20)
CO2: 37 mmol/L — ABNORMAL HIGH (ref 22–32)
Calcium: 8.8 mg/dL — ABNORMAL LOW (ref 8.9–10.3)
Chloride: 90 mmol/L — ABNORMAL LOW (ref 98–111)
Creatinine, Ser: 0.67 mg/dL (ref 0.44–1.00)
GFR, Estimated: >60 mL/min (ref >60)
Glucose, Bld: 198 mg/dL — ABNORMAL HIGH (ref 70–99)
Potassium: 3.9 mmol/L (ref 3.5–5.1)
Sodium: 137 mmol/L (ref 135–145)
Total Bilirubin: 0.8 mg/dL (ref 0.3–1.2)
Total Protein: 5.2 g/dL — ABNORMAL LOW (ref 6.5–8.1)

## 2020-05-21 LAB — FOLATE: Folate: 8.8 ng/mL (ref >5.9)

## 2020-05-21 LAB — MAGNESIUM: Magnesium: 1.9 mg/dL (ref 1.7–2.4)

## 2020-05-21 LAB — CBC WITH DIFFERENTIAL/PLATELET
Abs Immature Granulocytes: 0.05 10*3/uL (ref 0.00–0.07)
Basophils Absolute: 0 10*3/uL (ref 0.0–0.1)
Basophils Relative: 0 %
Eosinophils Absolute: 0.1 10*3/uL (ref 0.0–0.5)
Eosinophils Relative: 4 %
HCT: 28 % — ABNORMAL LOW (ref 36.0–46.0)
Hemoglobin: 8.9 g/dL — ABNORMAL LOW (ref 12.0–15.0)
Immature Granulocytes: 1 %
Lymphocytes Relative: 23 %
Lymphs Abs: 0.9 10*3/uL (ref 0.7–4.0)
MCH: 29.5 pg (ref 26.0–34.0)
MCHC: 31.8 g/dL (ref 30.0–36.0)
MCV: 92.7 fL (ref 80.0–100.0)
Monocytes Absolute: 0.4 10*3/uL (ref 0.1–1.0)
Monocytes Relative: 10 %
Neutro Abs: 2.4 10*3/uL (ref 1.7–7.7)
Neutrophils Relative %: 62 %
Platelets: 100 10*3/uL — ABNORMAL LOW (ref 150–400)
RBC: 3.02 MIL/uL — ABNORMAL LOW (ref 3.87–5.11)
RDW: 19.9 % — ABNORMAL HIGH (ref 11.5–15.5)
WBC: 3.9 10*3/uL — ABNORMAL LOW (ref 4.0–10.5)
nRBC: 0 % (ref 0.0–0.2)

## 2020-05-21 LAB — IRON AND TIBC
Iron: 60 ug/dL (ref 28–170)
Saturation Ratios: 19 % (ref 10.4–31.8)
TIBC: 308 ug/dL (ref 250–450)
UIBC: 248 ug/dL

## 2020-05-21 LAB — HEPARIN LEVEL (UNFRACTIONATED): Heparin Unfractionated: 0.45 IU/mL (ref 0.30–0.70)

## 2020-05-21 LAB — SARS CORONAVIRUS 2 BY RT PCR (HOSPITAL ORDER, PERFORMED IN ~~LOC~~ HOSPITAL LAB): SARS Coronavirus 2: NEGATIVE

## 2020-05-21 LAB — VITAMIN B12: Vitamin B-12: 284 pg/mL (ref 180–914)

## 2020-05-21 LAB — PHOSPHORUS: Phosphorus: 3.9 mg/dL (ref 2.5–4.6)

## 2020-05-21 LAB — GLUCOSE, RANDOM: Glucose, Bld: 422 mg/dL — ABNORMAL HIGH (ref 70–99)

## 2020-05-21 LAB — RETICULOCYTES
Immature Retic Fract: 34.2 % — ABNORMAL HIGH (ref 2.3–15.9)
RBC.: 3.32 MIL/uL — ABNORMAL LOW (ref 3.87–5.11)
Retic Count, Absolute: 405 10*3/uL — ABNORMAL HIGH (ref 19.0–186.0)
Retic Ct Pct: 12.2 % — ABNORMAL HIGH (ref 0.4–3.1)

## 2020-05-21 LAB — GLUCOSE, CAPILLARY
Glucose-Capillary: 183 mg/dL — ABNORMAL HIGH (ref 70–99)
Glucose-Capillary: 163 mg/dL — ABNORMAL HIGH (ref 70–99)
Glucose-Capillary: 199 mg/dL — ABNORMAL HIGH (ref 70–99)
Glucose-Capillary: 418 mg/dL — ABNORMAL HIGH (ref 70–99)
Glucose-Capillary: 400 mg/dL — ABNORMAL HIGH (ref 70–99)

## 2020-05-21 LAB — FERRITIN: Ferritin: 589 ng/mL — ABNORMAL HIGH (ref 11–307)

## 2020-05-21 MED ORDER — FUROSEMIDE 40 MG PO TABS: 40.0000 mg | ORAL_TABLET | Freq: Every day | ORAL | 11 refills | Status: DC

## 2020-05-21 MED ORDER — POLYETHYLENE GLYCOL 3350 17 G PO PACK: 17.0000 g | PACK | Freq: Two times a day (BID) | ORAL | 0 refills | Status: AC | PRN

## 2020-05-21 MED ORDER — OXYCODONE HCL 5 MG PO TABS
5.0000 mg | ORAL_TABLET | ORAL | 0 refills | Status: DC | PRN
Start: 2020-05-21 — End: 2020-06-11

## 2020-05-21 MED ORDER — UMECLIDINIUM BROMIDE 62.5 MCG/INH IN AEPB: 1.0000 | INHALATION_SPRAY | Freq: Every day | RESPIRATORY_TRACT | Status: AC

## 2020-05-21 MED ORDER — CLONAZEPAM 0.5 MG PO TABS: 0.5000 mg | ORAL_TABLET | Freq: Two times a day (BID) | ORAL | 0 refills | Status: AC

## 2020-05-21 MED ORDER — NYSTATIN 100000 UNIT/GM EX POWD: Freq: Two times a day (BID) | CUTANEOUS | 0 refills | Status: AC

## 2020-05-21 MED ORDER — MORPHINE SULFATE (PF) 4 MG/ML IV SOLN
INTRAVENOUS | Status: AC
  Administered 2020-05-21: 4 mg
  Filled 2020-05-21: qty 1

## 2020-05-21 MED ORDER — LIDOCAINE HCL (PF) 1 % IJ SOLN
INTRAMUSCULAR | Status: AC
  Administered 2020-05-21: 5 mL
  Filled 2020-05-21: qty 20

## 2020-05-21 MED ORDER — SENNOSIDES-DOCUSATE SODIUM 8.6-50 MG PO TABS: 1.0000 | ORAL_TABLET | Freq: Every day | ORAL | Status: AC

## 2020-05-21 MED ORDER — PREGABALIN 150 MG PO CAPS: 150.0000 mg | ORAL_CAPSULE | Freq: Three times a day (TID) | ORAL | 0 refills | Status: AC

## 2020-05-21 MED ORDER — APIXABAN 5 MG PO TABS: ORAL_TABLET | ORAL | 0 refills | Status: DC

## 2020-05-21 MED ORDER — INSULIN GLARGINE 100 UNIT/ML ~~LOC~~ SOLN
15.0000 [IU] | Freq: Every day | SUBCUTANEOUS | 11 refills | Status: DC
Start: 2020-05-21 — End: 2020-06-10

## 2020-05-21 NOTE — Progress Notes (Addendum)
      [address]       [place] [address]             [phone]      4 Days Post-Op Procedure(s) (LRB): RIGHT CHEST WALL WOUND CLOSURE REMOVAL OF WOUND VAC (Right) Subjective:  C/O some nausea this morning but said she wants to try to eat breakfast. She remains afebrile, O2 sats acceptable on 4Lnc.    Objective: Vital signs in last 24 hours: Temp:  [97.7 F (36.5 C)-98.6 F (37 C)] 97.7 F (36.5 C) (12/07 0745) Pulse Rate:  [85-98] 85 (12/07 0745) Cardiac Rhythm: Normal sinus rhythm (12/07 0746) Resp:  [14-21] 21 (12/07 0745) BP: (101-118)/(65-74) 101/74 (12/07 0745) SpO2:  [95 %-97 %] 95 % (12/07 0745) Weight:  [100.2 kg] 100.2 kg (12/07 0427)    Intake/Output from previous day: 12/06 0701 - 12/07 0700 In: 762.3 [P.O.:100; I.V.:662.3] Out: 3200 [Urine:3200] Intake/Output this shift: No intake/output data recorded.  General appearance: alert, cooperative and mild distress due to nausea Neurologic: intact Lungs: Coarse cough.  CXR is stable, slight improvement in aeration bilaterterally Wound: The right chest wound closure dressing was removed, the closure is well approximated and dry. The chest tube site dressings are dry.   Lab Results: Recent Labs    05/20/20 0246 05/21/20 0045  WBC 3.8* 3.9*  HGB 8.0* 8.9*  HCT 26.7* 28.0*  PLT 97* 100*   BMET:  Recent Labs    05/20/20 0246 05/21/20 0045  NA 138 137  K 4.7 3.9  CL 89* 90*  CO2 41* 37*  GLUCOSE 185* 198*  BUN 15 15  CREATININE 0.77 0.67  CALCIUM 8.4* 8.8*    PT/INR: No results for input(s): LABPROT, INR in the last 72 hours. ABG    Component Value Date/Time   PHART 7.400 05/12/2020 1524   HCO3 36.1 (H) 05/12/2020 1524   TCO2 38 (H) 05/12/2020 1524   O2SAT 99.0 05/12/2020 1524   CBG (last 3)  Recent Labs    05/20/20 2352 05/21/20 0424 05/21/20 0747  GLUCAP 225* 183* 163*    Assessment/Plan: S/P Procedure(s) (LRB): RIGHT CHEST WALL WOUND CLOSURE REMOVAL OF WOUND  VAC (Right)  -POD-9 right VATS / mechanical pleurodesis with evacuation of a large chest wall hematoma post chest tube placement at OSH, POD4 closure of right chest wall wound after treatment with a wound vac x 5 days. Plt count is recovering, WBC normal.  CT's removed yesterday. CXR showing slight improvement. Continue to encourage pulmonary hygiene.     LOS: 14 days    Leary Roca, [place] [phone] 05/21/2020 Pt seen and examined personally. Agree with notation. CXR stable. Wound healing well. She is eager to return to her local area Financial pl[facility]) for rehab. Ok to go from a thoracic surgery perspective. Valen Mascaro Z. Vickey Sages, MD [phone]

## 2020-05-21 NOTE — TOC Progression Note (Signed)
Transition of Care [facility]) - Progression Note    Patient Details  Name: Annette [name] MRN: [id number] Date of Birth: [birth year]  Transition of Care [facility]) CM/SW Contact  Eduard Roux, [place] Phone Number: 05/21/2020, 5:01 PM  Clinical Narrative:     Patient has declined SNF- patient states she has support of her family in the home.  RN & RNCM updated   Antony Blackbird, MSW, LCSWA Clinical Social Worker   Expected Discharge Plan: Skilled Nursing Facility Barriers to Discharge: English as a second language teacher, SNF Pending bed offer  Expected Discharge Plan and Services Expected Discharge Plan: Skilled Nursing Facility In-house Referral: Clinical Social Work       Expected Discharge Date: 05/21/20                                     Social Determinants of Health (SDOH) Interventions    Readmission Risk Interventions No flowsheet data found.

## 2020-05-21 NOTE — Progress Notes (Signed)
Notified by RN last this PM that paitent has swelling and pain to R breast, side which Chest tubes were placed Have asked that CVTS eval to see if this is in keeping with post-procedural expectation or out of ordinary- Also quite hyperglycemic and being confirmed by labs--will hold d/c today and monitor overnight

## 2020-05-21 NOTE — TOC Progression Note (Addendum)
Transition of Care [facility]) - Progression Note    Patient Details  Name: Annette [name] MRN: [id number] Date of Birth: 15-Sep-1960  Transition of Care [facility]) CM/SW Contact  Eduard Roux, [place] Phone Number: 05/21/2020, 4:06 PM  Clinical Narrative:     Patient has only one bed offer- [facility]- CSW contacted [facility] - they will not have a bed available for a few days.  Faxed clinicals to [facility] for them to review   CSW will continue to follow and seek placement.  Antony Blackbird, MSW, LCSWA Clinical Social Worker   Expected Discharge Plan: Skilled Nursing Facility Barriers to Discharge: Insurance Authorization, SNF Pending bed offer  Expected Discharge Plan and Services Expected Discharge Plan: Skilled Nursing Facility In-house Referral: Clinical Social Work       Expected Discharge Date: 05/21/20                                     Social Determinants of Health (SDOH) Interventions    Readmission Risk Interventions No flowsheet data found.

## 2020-05-21 NOTE — TOC Initial Note (Signed)
Transition of Care [facility]) - Initial/Assessment Note    Patient Details  Name: Annette [name] MRN: [id number] Date of Birth: 1961/02/05  Transition of Care [facility]) CM/SW Contact:    Eduard Roux, LCSWA Phone Number: 05/21/2020, 12:18 PM  Clinical Narrative:                  CSW visit with patient at bedside. CSW introduced self and explained role. CSW discussed PT updated recommendation form home to short term rehab at [facility], [place]. Patient recognizes need for rehab before returning home and is agreeable to a SNF placement. Patient reported preference for [facility] area but CSW explain she would be responsible for transportation cost outside of the 50 mile radius. Patient states understanding and was agreeable to SNF search locally. Patient has not received covid vaccine- CSW explained 14 day quarantine requirement at [facility]. Patient states no questions or concerns at this time.    CSW will provide bed offers once available CSW will start insurance authorization today CSW will continue to follow and assist with discharge planning.   Antony Blackbird, MSW, LCSWA Clinical Social Worker   Expected Discharge Plan: Skilled Nursing Facility Barriers to Discharge: Insurance Authorization, SNF Pending bed offer   Patient Goals and CMS Choice        Expected Discharge Plan and Services Expected Discharge Plan: Skilled Nursing Facility In-house Referral: Clinical Social Work       Expected Discharge Date: 05/21/20                                    Prior Living Arrangements/Services   Lives with:: Self Patient language and need for interpreter reviewed:: No        Need for Family Participation in Patient Care: Yes (Comment) Care giver support system in place?: Yes (comment)   Criminal Activity/Legal Involvement Pertinent to Current Situation/Hospitalization: No - Comment as needed  Activities of Daily Living Home Assistive Devices/Equipment: Eyeglasses, Dentures (specify  type) ADL Screening (condition at time of admission) Patient's cognitive ability adequate to safely complete daily activities?: Yes Is the patient deaf or have difficulty hearing?: No Does the patient have difficulty seeing, even when wearing glasses/contacts?: No Does the patient have difficulty concentrating, remembering, or making decisions?: No Patient able to express need for assistance with ADLs?: Yes Does the patient have difficulty dressing or bathing?: Yes Independently performs ADLs?: Yes (appropriate for developmental age) Does the patient have difficulty walking or climbing stairs?: Yes Weakness of Legs: Both (chronic) Weakness of Arms/Hands: None  Permission Sought/Granted Permission sought to share information with : Family Supports Permission granted to share information with : Yes, Verbal Permission Granted  Share Information with NAME: Dustin Folks  Permission granted to share info w AGENCY: SNFs  Permission granted to share info w Relationship: Aunt  Permission granted to share info w Contact Information: [phone]  Emotional Assessment Appearance:: Appears older than stated age Attitude/Demeanor/Rapport: Engaged Affect (typically observed): Accepting, Appropriate Orientation: : Oriented to Self, Oriented to Place, Oriented to  Time, Oriented to Situation Alcohol / Substance Use: Not Applicable Psych Involvement: No (comment)  Admission diagnosis:  MRSA pneumonia (HCC) [V78.588] Patient Active Problem List   Diagnosis Date Noted  . Palliative care by specialist   . Goals of care, counseling/discussion   . DNR (do not resuscitate)   . Pressure injury of skin 05/08/2020  . MRSA pneumonia (HCC) 05/07/2020  . Pulmonary embolism (HCC) 05/07/2020  .  Pneumothorax 05/07/2020  . Acute hypoxemic respiratory failure (HCC) 05/07/2020  . COPD (chronic obstructive pulmonary disease) (HCC) 05/07/2020   PCP:  Patient, No Pcp Per Pharmacy:   [facility]  [phone] - [place], [place] - [address] AT [facility] [address] [place] [address] Phone: [phone] Fax: [phone]     Social Determinants of Health (SDOH) Interventions    Readmission Risk Interventions No flowsheet data found.

## 2020-05-21 NOTE — Progress Notes (Signed)
Occupational Therapy Treatment Patient Details Name: Annette [name] MRN: [id number] DOB: 08-12-1960 Today's Date: 05/21/2020    History of present illness 59 yo female admitted to [facility] on 05/03/2020 with R upper lobe MRSA pneumonia and R lower lobe subsegmental PE. Developed right-sided hydropneumothorax on 11/22 underwent chest tube insertion and on 05/06/2020 transfered to [facility] for CTS evaluation and treatment. Additional R chest tube placement 11/24.  On 11/28, patient underwent VATS/thoracotomy mechanical pleurodesis and wound VAC placement. PMH including Covid pneumonia approximately 2 months ago, recent PE on Eliquis, COPD, asthma, depression, hypertension, and diabetes.   OT comments  Pt refused any OOB activity but agreeable to EOB activity this session. Pt with performed bed mobility multiple reps for placement of bed pan and to change saturated/soiled bed linens. Pt sat EOB for simulated bathing tasks, grooming  and dressing tasks. Pt recognizes need to ST rehab and will d/c to SNF after acute stay. OT will continue to follow acutely to maximize level of function and safety  Follow Up Recommendations  SNF    Equipment Recommendations  3 in 1 bedside commode;Other (comment) (TBD at next venue of care)    Recommendations for Other Services      Precautions / Restrictions Precautions Precautions: Fall Restrictions Weight Bearing Restrictions: No       Mobility Bed Mobility Overal bed mobility: Needs Assistance Bed Mobility: Supine to Sit;Sit to Supine     Supine to sit: Mod assist Sit to supine: Mod assist   General bed mobility comments: mod A to elevate trunk, mod A with LEs back onto bed. Pt able to scoot to [facility] with bed controls tilitng bed back and using LEs to push  Transfers                 General transfer comment: pt refused any standing or OOB activity, agreeable to sitting EOB    Balance Overall balance assessment: Needs  assistance Sitting-balance support: No upper extremity supported Sitting balance-Leahy Scale: Fair Sitting balance - Comments: UE support to balance                                   ADL either performed or assessed with clinical judgement   ADL Overall ADL's : Needs assistance/impaired     Grooming: Wash/dry face;Wash/dry hands;Supervision/safety;Set up;Sitting   Upper Body Bathing: Minimal assistance;Sitting Upper Body Bathing Details (indicate cue type and reason): simulated seated EOB Lower Body Bathing: Moderate assistance Lower Body Bathing Details (indicate cue type and reason): simulated seated EOB Upper Body Dressing : Sitting;Minimal assistance   Lower Body Dressing: Maximal assistance;Sitting/lateral leans     Toilet Transfer Details (indicate cue type and reason): declined Toileting- Clothing Manipulation and Hygiene: Minimal assistance;Sitting/lateral lean Toileting - Clothing Manipulation Details (indicate cue type and reason): seated EOB       General ADL Comments: pt rolled in bed multiple times for use of bed pan, peri hygiene and to change soiled/saturated linend on bed. Pt sat EOB for ADL activity x 12 minutes and and sitting tolerance     Vision Patient Visual Report: No change from baseline     Perception     Praxis      Cognition Arousal/Alertness: Awake/alert Behavior During Therapy: WFL for tasks assessed/performed Overall Cognitive Status: Within Functional Limits for tasks assessed  Exercises     Shoulder Instructions       General Comments      Pertinent Vitals/ Pain       Pain Assessment: 0-10 Pain Score: 5  Pain Location: back, L LE, chest tube site (has been d/c'd) Pain Descriptors / Indicators: Burning;Sore Pain Intervention(s): Limited activity within patient's tolerance;Monitored during session;Repositioned  Home Living                                           Prior Functioning/Environment              Frequency  Min 2X/week        Progress Toward Goals  OT Goals(current goals can now be found in the care plan section)  Progress towards OT goals: Progressing toward goals  Acute Rehab OT Goals Patient Stated Goal: to get some rehab and get stronger  Plan Discharge plan needs to be updated    Co-evaluation                 AM-PAC OT "6 Clicks" Daily Activity     Outcome Measure   Help from another person eating meals?: None Help from another person taking care of personal grooming?: A Little Help from another person toileting, which includes using toliet, bedpan, or urinal?: A Lot Help from another person bathing (including washing, rinsing, drying)?: A Lot Help from another person to put on and taking off regular upper body clothing?: A Little Help from another person to put on and taking off regular lower body clothing?: A Lot 6 Click Score: 16    End of Session    OT Visit Diagnosis: Unsteadiness on feet (R26.81);Other abnormalities of gait and mobility (R26.89);Muscle weakness (generalized) (M62.81)   Activity Tolerance Patient limited by fatigue   Patient Left in bed;with call bell/phone within reach   Nurse Communication          Time: 0258-5277 OT Time Calculation (min): 35 min  Charges: OT General Charges $OT Visit: 1 Visit OT Treatments $Self Care/Home Management : 8-22 mins $Therapeutic Activity: 8-22 mins     Galen Manila 05/21/2020, 4:04 PM

## 2020-05-21 NOTE — Progress Notes (Signed)
ANTICOAGULATION CONSULT NOTE  Pharmacy Consult for Heparin  Indication: pulmonary embolus  Allergies  Allergen Reactions  . Cymbalta [Duloxetine Hcl]     Psychosis "self-harm"   Patient Measurements: Height: 5\' 8"  (172.7 cm) Weight: 100.2 kg (220 lb 14.4 oz) IBW/kg (Calculated) : 63.9  Heparin dosing wt: 90 kg  Vital Signs: Temp: 98.2 F (36.8 C) (12/07 0428) Temp Source: Oral (12/07 0428) BP: 103/73 (12/07 0428) Pulse Rate: 85 (12/07 0428)  Assessment: 59 y/o F transfer from [facility]. Pt has PE and had been on Eliquis recently (last dose not clear) and was transferred on heparin. Pt is s/p chest tube placement and IV heparin was resumed for PE on 11/24. On 11/26, Hgb dropped from 11 to 7.7. CTA showed new right-sided hemothorax. Heparin was subsequently held. Restarted on 11/30.  Heparin held again 12/3 for chest wall closure.  Restarted 12/4.  Heparin remains therapeutic on 2650 units/hr. Hgb stable 8s post-op and PLTC improving = 100.   Goal of Therapy:  Heparin level 0.3-0.7 units/ml  Monitor platelets by anticoagulation protocol: Yes   Plan:  Continue IV heparin infusion to 2650 units/hr Monitor daily heparin level, CBC  14/4, PharmD Clinical Pharmacist **Pharmacist phone directory can now be found on [email or web address] ([email or web address]).  Listed under [facility].

## 2020-05-21 NOTE — Op Note (Addendum)
CARDIOTHORACIC SURGERY OPERATIVE NOTE  Date of Procedure:  05/21/2020  Preoperative Diagnosis: Right Pneumothorax  Postoperative Diagnosis: Same  Procedure:   Right chest tube placement  Surgeon:   Salvatore Decent. Cornelius Moras, MD  Anesthesia: 1% lidocaine local with intravenous sedation    INDICATIONS FOR PROCEDURE  Called to see patient with sudden onset severe subcutaneous emphysema surrounding the right breast extending up to the neck.  Portable chest x-ray revealed findings consistent with small pneumothorax which appeared to be located anterior and associated with massive subcutaneous emphysema.  Circumstances were explained to the patient and the need for chest tube placement discussed.   DETAILS OF THE OPERATIVE PROCEDURE  Following full informed consent the patient was given morphine 4 mg intravenously and continuously monitored for rhythm, BP and oxygen saturation. The anterolateral chest just beneath and lateral to the right breast was prepared and draped in a sterile manner. 1% lidocaine was utilized to anesthetize the skin and subcutaneous tissues. A small incision was made and a 28 French straight chest tube was placed through the incision into the pleural space.  There was an immediate rush of air and a moderate amount of old, dark blood was evacuated.  The tube was secured to the skin and connected to a closed suction collection device. The patient tolerated the procedure well.  A follow-up portable CXR was ordered. There were no complications.    Salvatore Decent. Cornelius Moras, MD 05/21/2020 7:45 PM

## 2020-05-21 NOTE — Care Management Important Message (Signed)
Important Message  Patient Details  Name: Annette [name] MRN: [id number] Date of Birth: 17-Nov-1960   Medicare Important Message Given:  Yes  Pt. On Contact  Precautions gave letter to Nurse.     Sloan Leiter Smith 05/21/2020, 9:53 AM

## 2020-05-21 NOTE — NC FL2 (Signed)
[place] MEDICAID FL2 LEVEL OF CARE SCREENING TOOL     IDENTIFICATION  Patient Name: Annette [name] Birthdate: September 26, 1960 Sex: female Admission Date (Current Location): 05/07/2020  [place] Number:   [facility])   Facility and Address:  [facility] [employer] [facility], [address] [place], [place] [address]      Provider Number: [id number]  Attending Physician Name and Address:  Rhetta Mura, MD  Relative Name and Phone Number:       Current Level of Care: Hospital Recommended Level of Care: Skilled Nursing Facility Prior Approval Number:    Date Approved/Denied:   PASRR Number: [id number]  Discharge Plan: SNF    Current Diagnoses: Patient Active Problem List   Diagnosis Date Noted  . Palliative care by specialist   . Goals of care, counseling/discussion   . DNR (do not resuscitate)   . Pressure injury of skin 05/08/2020  . MRSA pneumonia (HCC) 05/07/2020  . Pulmonary embolism (HCC) 05/07/2020  . Pneumothorax 05/07/2020  . Acute hypoxemic respiratory failure (HCC) 05/07/2020  . COPD (chronic obstructive pulmonary disease) (HCC) 05/07/2020    Orientation RESPIRATION BLADDER Height & Weight     Self, Time, Situation, Place  O2 External catheter, Continent Weight: 220 lb 14.4 oz (100.2 kg) Height:  5\' 8"  (172.7 cm)  BEHAVIORAL SYMPTOMS/MOOD NEUROLOGICAL BOWEL NUTRITION STATUS      Continent Diet (please see discharge summary)  AMBULATORY STATUS COMMUNICATION OF NEEDS Skin   Extensive Assist Verbally Surgical wounds (pressure injury, closed incsion RT chest, closed incision Upper lateral chest, closed incision chest)                       Personal Care Assistance Level of Assistance  Bathing, Feeding, Dressing Bathing Assistance: Limited assistance Feeding assistance: Independent Dressing Assistance: Limited assistance     Functional Limitations Info  Sight, Hearing, Speech Sight Info: Adequate Hearing Info:  Adequate Speech Info: Adequate    SPECIAL CARE FACTORS FREQUENCY  PT (By licensed PT), OT (By licensed OT)     PT Frequency: 5x per week OT Frequency: 5x per week            Contractures Contractures Info: Not present    Additional Factors Info  Code Status, Allergies, Psychotropic, Isolation Precautions, Insulin Sliding Scale Code Status Info: DNR Allergies Info: Cymbalta Psychotropic Info: clonazePAM (KLONOPIN) tablet 0.5 mg Insulin Sliding Scale Info: see discharge summary Isolation Precautions Info: MRSA     Current Medications (05/21/2020):  This is the current hospital active medication list Current Facility-Administered Medications  Medication Dose Route Frequency Provider Last Rate Last Admin  . (feeding supplement) PROSource Plus liquid 30 mL  30 mL Oral BID BM 14/12/2019, MD   30 mL at 05/17/20 0857  . atorvastatin (LIPITOR) tablet 40 mg  40 mg Oral Daily 14/03/21, MD   40 mg at 05/21/20 0815  . bisacodyl (DULCOLAX) EC tablet 10 mg  10 mg Oral Daily 14/07/21, MD   10 mg at 05/21/20 0815  . Chlorhexidine Gluconate Cloth 2 % PADS 6 each  6 each Topical Daily 14/07/21, MD   6 each at 05/21/20 [phone]  . cholecalciferol (VITAMIN D3) tablet 1,000 Units  1,000 Units Oral Daily 1751, MD   1,000 Units at 05/21/20 0815  . clonazePAM (KLONOPIN) tablet 0.5 mg  0.5 mg Oral BID 14/07/21, MD   0.5 mg at 05/21/20 0815  . fenofibrate  tablet 160 mg  160 mg Oral Daily [name], MD   160 mg at 05/21/20 0815  . furosemide (LASIX) injection 40 mg  40 mg Intravenous BID [name], MD   40 mg at 05/21/20 0816  . Gerhardt's butt cream   Topical BID Drema Dallas, MD   Given at 05/21/20 [phone]  . guaiFENesin (MUCINEX) 12 hr tablet 600 mg  600 mg Oral BID [name], MD   600 mg at 05/21/20 0815  . heparin ADULT infusion 100 units/mL (25000 units/273mL sodium chloride 0.45%)  2,650 Units/hr Intravenous Continuous  Stevphen Rochester, RPH 26.5 mL/hr at 05/21/20 1021 2,650 Units/hr at 05/21/20 1021  . influenza vac split quadrivalent PF (FLUARIX) injection 0.5 mL  0.5 mL Intramuscular Prior to discharge Candelaria Stagers T, MD      . insulin aspart (novoLOG) injection 0-15 Units  0-15 Units Subcutaneous Q4H Drema Dallas, MD   3 Units at 05/21/20 [phone]  . insulin aspart (novoLOG) injection 12 Units  12 Units Subcutaneous TID WC Drema Dallas, MD   12 Units at 05/21/20 [phone]  . insulin glargine (LANTUS) injection 30 Units  30 Units Subcutaneous QHS Rhetta Mura, MD   30 Units at 05/20/20 2234  . levalbuterol (XOPENEX) nebulizer solution 0.63 mg  0.63 mg Nebulization Q6H PRN Drema Dallas, MD      . lidocaine (LIDODERM) 5 % 1 patch  1 patch Transdermal Q24H [name], MD   1 patch at 05/19/20 2111  . magnesium citrate solution 1 Bottle  1 Bottle Oral Daily PRN Candelaria Stagers T, MD      . multivitamin with minerals tablet 1 tablet  1 tablet Oral Daily [name], MD   1 tablet at 05/21/20 0815  . nystatin (MYCOSTATIN/NYSTOP) topical powder   Topical BID [name], MD   Given at 05/21/20 [phone]  . ondansetron (ZOFRAN) injection 4 mg  4 mg Intravenous Q6H PRN Shalhoub, Deno Lunger, MD      . oxyCODONE (Oxy IR/ROXICODONE) immediate release tablet 5-10 mg  5-10 mg Oral Q4H PRN [name], MD   10 mg at 05/20/20 1157  . pneumococcal 23 valent vaccine (PNEUMOVAX-23) injection 0.5 mL  0.5 mL Intramuscular Prior to discharge Gonfa, Taye T, MD      . polyethylene glycol (MIRALAX / GLYCOLAX) packet 17 g  17 g Oral BID PRN Candelaria Stagers T, MD      . pregabalin (LYRICA) capsule 150 mg  150 mg Oral TID [name], MD   150 mg at 05/21/20 4765  . senna-docusate (Senokot-S) tablet 1 tablet  1 tablet Oral QHS [name], MD   1 tablet at 05/20/20 2225  . traMADol (ULTRAM) tablet 50-100 mg  50-100 mg Oral Q6H PRN [name], MD   100 mg at 05/13/20 2151  . umeclidinium bromide (INCRUSE  ELLIPTA) 62.5 MCG/INH 1 puff  1 puff Inhalation Daily Almon Hercules, MD   1 puff at 05/21/20 4650     Discharge Medications: Please see discharge summary for a list of discharge medications.  Relevant Imaging Results:  Relevant Lab Results:   Additional Information SSN [id number]     followed by [facility] [place], [place]

## 2020-05-21 NOTE — Progress Notes (Signed)
Unable to find post Chest tube x-ray. Put order in for stat now. Patient left side of chest is getting S.Q. air.It is getting  bigger and hard going up her neck to her face and left upper arm.Right side looks bigger . C.T. to 20 cm wall suction drained 58 ml of blood and clots. Air leak is #1 . Sometimes #3. No resp. Distress noted. Dr. Cornelius Moras was made aware of CXR results and I have spoken with him several times Will transfer patient to 2 [facility]. Delma Post. rapid response aware and now on floor. Patient request that daughter-law be called in the morning. Awaiting return call from [facility] to give report.Dr.Shalhaub made aware and came to see patient. Marland Kitchen

## 2020-05-21 NOTE — Discharge Summary (Signed)
Physician Discharge Summary  Annette ModenaKathy [name] ZOX:[id number] DOB: 06-Jul-1960 DOA: 05/07/2020  PCP: Patient, No Pcp Per  Admit date: 05/07/2020 Discharge date: 05/21/2020  Time spent: 40 minutes  Recommendations for Outpatient Follow-up:  1. Requires chest x-ray two-view in about 1 week still not clearing of infiltrates and fluid in lungs 2. Needs outpatient follow-up with cardiovascular surgery to remove staples and interval follow-up-I have CCed Dr. Charlann Boxerlin to make sure he is aware 3. Starting on Eliquis pulmonary embolism dosing for recent PE-anticipate will need this at least for 3 to 6 months and then can discontinue the same 4. Note dosage change of medications including addition of Lantus this admission which she will need teaching for 5. Recommend Chem-12, CBC 1 week 6. Consider IV iron supplementation in the outpatient setting  Discharge Diagnoses:  Principal Problem:   MRSA pneumonia (HCC) Active Problems:   Pulmonary embolism (HCC)   Pneumothorax   Acute hypoxemic respiratory failure (HCC)   COPD (chronic obstructive pulmonary disease) (HCC)   Pressure injury of skin   Palliative care by specialist   Goals of care, counseling/discussion   DNR (do not resuscitate)   Discharge Condition: Improved but overall frail  Diet recommendation: Heart healthy diabetic  Filed Weights   05/18/20 0416 05/20/20 0300 05/21/20 0427  Weight: 107.3 kg 106.2 kg 100.2 kg    History of present illness:  59 year old white female coronavirus infection October 2021, recent PE on Eliquis COPD asthma stage I T2 DM TY 2 HTN HLD [facility]2020 right upper lobe MRSA pneumonia right lower lobe subsegmental PE (no heart strain)-Rx vancomycin-->Zyvox + Lovenox Developed right-sided hydropneumothorax 11/22, right chest tube 11/22 and transferred for CVTS involvement Found to have large right pneumothorax subcutaneous emphysema pneumomediastinum additional chest tube  placed 11/24- This was complicated by bleeding around chest tube hemoglobin dropped, patient transfused 2 units CT chest showed new nonocclusive PE RLL right-sided pneumothorax mediastinal shift--large right flank hematoma was found in addition Patient underwent VATS with thoracotomy 11/28 and was transferred out of ICU to progressive unit  She has steadily improved and chest tube was pulle don 12/6  [facility]  Hospital Course:  1. Right-sided pneumothorax a. Chest tubes out today per CVTS--- no deleterious effects and chest x-ray shows some resolution of pneumothorax etc. b. Pain control per CVTS continues Tylenol first choice 2. Anemia of acute blood loss secondary to left flank hematoma status post evacuation by CVTS a. Post surgical care and follow-up per CVTS-Dr. Constance Goltzlen has been CCed regarding the same b. Hemoglobin stable 8 range-transfusion threshold 7 c. transfused 2 u 11/27 and 11/28-total 4 d. Monitor trends in am-hemoglobin on discharge was 8.9 e. Check iron studies and supplement 3. Right upper lobe MRSA with aspiration pneumonia a. Abx completed 12/02 after 10 days total abx b. Monitor trends-CXR repeat 12/7 showed mild improvements c. Repeat x-ray in 3 to 4 weeks due to no clearance 4. Acute RLL subsegmental PE a. Initially because of procedures was on IV heparin which was discontinued b. TTE neg for Heart strain c. Transition to Eliquis pulmonary embolism dosing on discharge will need at least anticipated for 3 to 6 months total d. Outpatient follow-up needed 5. DM TY 2 A1c 10.7 a. cbg 200-316 b. Insulin nave this admission-however started on Lantus which she will need teaching for c. Discharging on glipizide 10 twice daily make sure that she takes meals with the same 6. Chronic COPD a. Cont Levalbuterol, incruse mucinex 7. Anxiety  and neuropathy a. Cont klonopin 0.5 bid, lyrica 150 tid-prescription given 8. Recent treatment  COVID-19 9. Thrombocytopenia 10. Stage II pressure ulcer from prior to admission 1. Continue dressings 11. BMI 37   Consultants:  IR Cardiothoracic surgery   Procedures/Significant Events:  05/06/2020-right chest tube placement at outside hospital 05/08/2020-second right chest tube placement by IR  05/12/2020-VATS/thoracotomy mechanical pleurodesis and wound VAC placement 11/30 bilateral lower extremity Doppler; negative DVT 12/1 PCXR-extensive airspace consolidation throughout much of the right lung, stable. -Left base atelectasis. No new opacity appreciable. Chest tubes remain on the right, unchanged in position, without pneumothorax. Stable cardiac silhouette.  12/3 RIGHT CHEST WALL WOUND CLOSURE/removal wound VAC by cardiothoracic surgery (i.e. Studies not automatically included, echos, thoracentesis, etc; not x-rays)  Chest tube removals 12/6   Discharge Exam: Vitals:   05/21/20 0428 05/21/20 0745  BP: 103/73 101/74  Pulse: 85 85  Resp: 20 (!) 21  Temp: 98.2 F (36.8 C) 97.7 F (36.5 C)  SpO2: 96% 95%    General: Slightly sleepy but otherwise looks well no distress states had some pain overnight no fever no chills Eating some Cardiovascular: S1-S2 no murmur no rub no gallop on monitors seems to be sinus rhythm sinus tach Chest is clear Large hematoma to right chest-staples in place Abdomen is soft nontender no rebound no guarding  no focal neurological deficit  Discharge Instructions   Discharge Instructions    Diet - low sodium heart healthy   Complete by: As directed    Discharge wound care:   Complete by: As directed    Ordered    05/16/20 1152   Wound care  Every shift      Comments: Apply Gerhardt's Butt cream to buttocks and groin area each shift or PRN soiling   Increase activity slowly   Complete by: As directed      Allergies as of 05/21/2020      Reactions   Cymbalta [duloxetine Hcl]    Psychosis "self-harm"      Medication List     TAKE these medications   apixaban 5 MG Tabs tablet Commonly known as: Eliquis Take 2 tablets ( ) twice daily for 7 days, then 1 tablet ( ) twice daily   atorvastatin 40 MG tablet Commonly known as: LIPITOR Take 40 mg by mouth daily.   cholecalciferol 25 MCG (1000 UNIT) tablet Commonly known as: VITAMIN D3 Take 1,000 Units by mouth daily.   clonazePAM 0.5 MG tablet Commonly known as: KLONOPIN Take 1 tablet (0.5 mg total) by mouth 2 (two) times daily.   fenofibrate 160 MG tablet Take 160 mg by mouth daily.   furosemide 40 MG tablet Commonly known as: Lasix Take 1 tablet (40 mg total) by mouth daily.   glipiZIDE 10 MG tablet Commonly known as: GLUCOTROL Take 10 mg by mouth 2 (two) times daily.   insulin glargine 100 UNIT/ML injection Commonly known as: LANTUS Inject 0.15 mLs (15 Units total) into the skin at bedtime.   montelukast 10 MG tablet Commonly known as: SINGULAIR Take 10 mg by mouth daily.   nystatin powder Commonly known as: MYCOSTATIN/NYSTOP Apply topically 2 (two) times daily.   omeprazole 40 MG capsule Commonly known as: PRILOSEC Take 40 mg by mouth 2 (two) times daily.   oxyCODONE 5 MG immediate release tablet Commonly known as: Oxy IR/ROXICODONE Take 1-2 tablets (5-10 mg total) by mouth every 4 (four) hours as needed for moderate pain.   polyethylene glycol 17 g packet Commonly known as: MIRALAX /  GLYCOLAX Take 17 g by mouth 2 (two) times daily as needed for mild constipation.   pregabalin 150 MG capsule Commonly known as: LYRICA Take 1 capsule (150 mg total) by mouth in the morning, at noon, and at bedtime.   senna-docusate 8.6-50 MG tablet Commonly known as: Senokot-S Take 1 tablet by mouth at bedtime.   umeclidinium bromide 62.5 MCG/INH Aepb Commonly known as: INCRUSE ELLIPTA Inhale 1 puff into the lungs daily. Start taking on: May 22, 2020            Discharge Care Instructions  (From admission, onward)          Start     Ordered   05/21/20 0000  Discharge wound care:       Comments: Ordered    05/16/20 1152   Wound care  Every shift      Comments: Apply Gerhardt's Butt cream to buttocks and groin area each shift or PRN soiling   05/21/20 0943         Allergies  Allergen Reactions  . Cymbalta [Duloxetine Hcl]     Psychosis "self-harm"      The results of significant diagnostics from this hospitalization (including imaging, microbiology, ancillary and laboratory) are listed below for reference.    Significant Diagnostic Studies: DG Chest 1 View  Result Date: 05/16/2020 CLINICAL DATA:  Shortness of breath EXAM: CHEST  1 VIEW COMPARISON:  May 15, 2020 FINDINGS: Chest tubes on the right are unchanged in position. No pneumothorax. Airspace consolidation throughout much of the right lung, most severe in the right upper lobe region, is stable there is apparent atelectatic change in the left lower lobe region. No new opacity evident. Heart size and pulmonary vascularity are within normal limits and stable. No adenopathy. No bone lesions IMPRESSION: Chest tubes unchanged in position on the right without pneumothorax. Extensive consolidation remains on the right with atelectatic change in the left lower lung region. No new opacity. Stable cardiac silhouette. Electronically Signed   By: Bretta Bang III M.D.   On: 05/16/2020 07:55   DG Chest 2 View  Result Date: 05/21/2020 CLINICAL DATA:  Chest pain, shortness of breath. EXAM: CHEST - 2 VIEW COMPARISON:  May 20, 2020. FINDINGS: The heart size and mediastinal contours are within normal limits. No pneumothorax is noted. Stable right lung opacity is noted, most prominently the right upper lobe, most consistent with pneumonia. Small right pleural effusion may be present. Stable mild left basilar atelectasis or infiltrate is noted. The visualized skeletal structures are unremarkable. IMPRESSION: Stable right upper lobe pneumonia. Small right  pleural effusion may be present. Stable mild left basilar atelectasis or infiltrate is noted. Electronically Signed   By: Lupita Raider M.D.   On: 05/21/2020 08:02   CT CHEST WO CONTRAST  Result Date: 05/08/2020 CLINICAL DATA:  Shortness of breath and cough. EXAM: CT CHEST WITHOUT CONTRAST TECHNIQUE: Multidetector CT imaging of the chest was performed following the standard protocol without IV contrast. COMPARISON:  Chest radiograph 05/08/2020 FINDINGS: Cardiovascular: Normal heart size. No pericardial effusions. Normal caliber thoracic aorta with scattered calcification. Coronary artery calcification. Mediastinum/Nodes: Pneumomediastinum is present. Subcutaneous emphysema throughout the right chest wall and extending to the right side of the neck. No significant lymphadenopathy. Visualized lymph nodes are not pathologically enlarged. Esophagus is mostly decompressed. Lungs/Pleura: Moderately large right pneumothorax. A right chest tube is in place. Atelectasis and consolidation in the right lung most prominent in the right upper lung. Air bronchograms and emphysematous changes are  present. Small bilateral pleural effusions. Emphysematous changes and scattered fibrosis in the left lung. Upper Abdomen: Cyst in the liver. No acute process demonstrated in the upper abdomen. Musculoskeletal: Degenerative changes in the spine. No destructive bone lesions. IMPRESSION: 1. Moderately large right pneumothorax with right chest tube in place. Subcutaneous emphysema throughout the right chest wall and extending to the right side of the neck. Pneumomediastinum. 2. Atelectasis and consolidation in the right lung most prominent in the right upper lung. 3. Small bilateral pleural effusions. 4. Emphysematous changes and fibrosis in the left lung. 5. Emphysema and aortic atherosclerosis. Aortic Atherosclerosis (ICD10-I70.0) and Emphysema (ICD10-J43.9). Electronically Signed   By: Burman Nieves M.D.   On: 05/08/2020 02:15    CT ANGIO CHEST PE W OR WO CONTRAST  Result Date: 05/11/2020 CLINICAL DATA:  Hemothorax.  Bleeding from chest tube. EXAM: CT ANGIOGRAPHY CHEST WITH CONTRAST TECHNIQUE: Multidetector CT imaging of the chest was performed using the standard protocol during bolus administration of intravenous contrast. Multiplanar CT image reconstructions and MIPs were obtained to evaluate the vascular anatomy. CONTRAST:  OMNIPAQUE IOHEXOL 350 MG/ML SOLN COMPARISON:  CT chest dated May 08, 2020 FINDINGS: Cardiovascular: Contrast injection is sufficient to demonstrate satisfactory opacification of the pulmonary arteries to the segmental level. There is an acute, nonocclusive pulmonary embolism involving the right lower lobe pulmonary artery. No other significant pulmonary emboli are identified on this study. The size of the main pulmonary artery is enlarged, measuring 3.2 cm. Heart size is normal, with no pericardial effusion. The course and caliber of the aorta are normal. There is no atherosclerotic calcification. Opacification decreased due to pulmonary arterial phase contrast bolus timing. Mediastinum/Nodes: --mild mediastinal adenopathy is noted. --mild hilar adenopathy is noted. -- No axillary lymphadenopathy. -- No supraclavicular lymphadenopathy. -- Normal thyroid gland where visualized. -  Unremarkable esophagus. Lungs/Pleura: 2 right-sided chest tubes are in place. There is a new anterior approach percutaneous chest tube in place. There is a persistent moderate-sized right-sided pneumothorax which has minimally decreased in size from prior study. There is persistent pneumomediastinum. The mediastinum is shifted to the left. The right upper lobe is nearly entirely replaced by consolidation. There is a new loculated right-sided hemothorax. The right-sided surgically placed chest tube is again noted. The chest tube is kinked both proximally and distally. Chest tube also possibly courses through the right lower  lobe causing a pulmonary laceration. There is a new right flank large hematoma measuring approximately 12 cm. Pockets of subcutaneous gas are again visualized along the patient's right flank. There is a trace left-sided pleural effusion. Debris is noted within the trachea and right lower lobe bronchus. Coarse airspace opacities and pulmonary fibrosis are again noted. Upper Abdomen: Contrast bolus timing is not optimized for evaluation of the abdominal organs. There is diffuse wall thickening of the gallbladder there is only partially visualized. A simple appearing cyst is noted in the liver. Bilateral adrenal nodules are again noted. Musculoskeletal: No chest wall abnormality. No bony spinal canal stenosis. Review of the MIP images confirms the above findings. IMPRESSION: 1. New, nonocclusive pulmonary embolism involving the right lower lobe pulmonary artery. No evidence for right-sided heart strain. 2. Persistent moderate-sized right-sided pneumothorax despite new chest tube placement. The mediastinum appears shifted to the left which has worsened since the prior study. This raises suspicion for developing tension physiology. 3. New moderate-sized, right-sided hemothorax. 4. The right-sided surgically placed chest tube is kinked both proximally and distally. Chest tube also possibly courses through the right lower lobe  causing a pulmonary laceration. 5. New large right flank hematoma measuring approximately 12 cm. No definite evidence for active arterial extravasation. 6. Diffuse wall thickening of the gallbladder which is only partially visualized. If there is clinical concern for acute cholecystitis, follow-up with ultrasound is recommended. 7. Coarse airspace opacities and pulmonary fibrosis are again noted. 8. Trace left-sided pleural effusion. These results were called by telephone at the time of interpretation on 05/11/2020 at 12:10 am to provider Charge Nurse Osvaldo Human , who verbally acknowledged  these results. Electronically Signed   By: Katherine Mantle M.D.   On: 05/11/2020 00:10   DG Chest Port 1 View  Result Date: 05/20/2020 CLINICAL DATA:  Status post chest tube removal. EXAM: PORTABLE CHEST 1 VIEW COMPARISON:  05/20/2020 at 0552 hours FINDINGS: The 3 right-sided chest tubes on the prior study have been removed. No pneumothorax is identified. Right lung airspace opacities including dense consolidation in the apex are unchanged. Mild opacities in the left mid and lower lung are also unchanged. There is likely a small right pleural effusion. IMPRESSION: 1. Interval removal of right chest tubes. No pneumothorax. 2. Unchanged bilateral airspace opacities including dense right apical consolidation. Electronically Signed   By: Sebastian Ache M.D.   On: 05/20/2020 16:22   DG CHEST PORT 1 VIEW  Result Date: 05/20/2020 CLINICAL DATA:  Pleural effusion, status post right thoracotomy EXAM: PORTABLE CHEST 1 VIEW COMPARISON:  05/19/2020 FINDINGS: No significant interval change in AP portable chest radiograph with 3 right-sided chest tubes and dense consolidation of the right lung, primarily at the apex. Probable small layering right pleural effusion, not significantly changed. Unchanged subtle heterogeneous airspace opacity of the left midlung and lung base. No new airspace opacity. The heart and mediastinum are unremarkable. IMPRESSION: 1. No significant interval change in chest radiographs with 3 right-sided chest tubes and dense consolidation of the right lung, primarily at the apex. Probable small layering right pleural effusion, not significantly changed. 2. Unchanged subtle heterogeneous airspace opacity of the left midlung and lung base. No new airspace opacity. Electronically Signed   By: Lauralyn Primes M.D.   On: 05/20/2020 07:42   DG CHEST PORT 1 VIEW  Result Date: 05/19/2020 CLINICAL DATA:  Follow-up pneumonia EXAM: PORTABLE CHEST 1 VIEW COMPARISON:  05/18/2020 FINDINGS: Cardiac shadow remains  enlarged. Persistent airspace opacity is noted in the right lung in the apex and base. Chest tubes are noted on the right without evidence of pneumothorax. Left lung shows some mild atelectatic changes. IMPRESSION: Stable appearance of the chest. Electronically Signed   By: Alcide Clever M.D.   On: 05/19/2020 08:12   DG CHEST PORT 1 VIEW  Result Date: 05/18/2020 CLINICAL DATA:  Follow-up pneumothorax EXAM: PORTABLE CHEST 1 VIEW COMPARISON:  05/16/2020 FINDINGS: Cardiac shadow is stable. Three chest tubes are noted on the right. No pneumothorax is noted. Persistent opacification in the right apex is noted. Left lung is well aerated. No bony abnormality is noted. IMPRESSION: Stable appearance of the chest when compared with the prior exam. Electronically Signed   By: Alcide Clever M.D.   On: 05/18/2020 10:08   DG CHEST PORT 1 VIEW  Result Date: 05/15/2020 CLINICAL DATA:  Cough. Airspace consolidation. Chest tubes in place. EXAM: PORTABLE CHEST 1 VIEW COMPARISON:  May 14, 2020 FINDINGS: Central catheter has been removed. There are 3 chest tubes on the right, unchanged in position. No appreciable pneumothorax. Extensive airspace consolidation throughout much of the right lung, particularly in the right upper lobe,  is stable. There is stable atelectasis in the left base. No new opacity evident. Heart size and pulmonary vascularity are within normal limits. No adenopathy evident. No bone lesions. IMPRESSION: Extensive airspace consolidation throughout much of the right lung, stable. Left base atelectasis. No new opacity appreciable. Chest tubes remain on the right, unchanged in position, without pneumothorax. Stable cardiac silhouette. Electronically Signed   By: Bretta Bang III M.D.   On: 05/15/2020 08:21   DG CHEST PORT 1 VIEW  Result Date: 05/14/2020 CLINICAL DATA:  Status post VATS, dyspnea EXAM: PORTABLE CHEST 1 VIEW COMPARISON:  05/13/2020 FINDINGS: Right internal jugular central venous  catheter with its tip overlying the superior right atrium and 3 right-sided large bore chest tubes are unchanged in position. There is persistent, stable right-sided volume loss with extensive consolidation throughout the right lung, likely infectious, inflammatory, or postsurgical in nature. Consolidation has progressed slightly in the interval since prior examination. Patchy infiltrate has progressed peripherally within the left lung base. No pneumothorax. Cardiac size within normal limits. IMPRESSION: Persistent right-sided volume loss with increasing consolidation within the right lung, likely infectious or inflammatory in nature. Right chest tubes in place. No pneumothorax or definite pleural effusion identified. Increasing patchy infiltrate within the left lung base, likely infectious or inflammatory. Electronically Signed   By: Helyn Numbers MD   On: 05/14/2020 06:26   DG CHEST PORT 1 VIEW  Result Date: 05/13/2020 CLINICAL DATA:  Status post video assisted thoracic surgery. EXAM: PORTABLE CHEST 1 VIEW COMPARISON:  May 12, 2020. FINDINGS: Stable cardiomediastinal silhouette. Stable position of 3 right-sided chest tubes. No pneumothorax is noted. Stable right lung opacity is noted concerning for pneumonia. Stable patchy opacities are noted in the left lung consistent with pneumonia. No pneumothorax is noted. Small right pleural effusion may be present. Bony thorax is unremarkable. IMPRESSION: Stable position of 3 right-sided chest tubes. No pneumothorax is noted. Stable bilateral lung opacities are noted consistent with pneumonia. Electronically Signed   By: Lupita Raider M.D.   On: 05/13/2020 08:21   DG CHEST PORT 1 VIEW  Result Date: 05/12/2020 CLINICAL DATA:  Recent pneumothorax EXAM: PORTABLE CHEST 1 VIEW COMPARISON:  May 11, 2020 FINDINGS: There are 2 chest tubes now directed superiorly on the right as well as a chest tube directed inferomedially on the right. Central catheter tip is  at the cavoatrial junction. There is subcutaneous air on the right but no appreciable pneumothorax. There is extensive airspace consolidation throughout much of the right lung, particularly in the right upper lobe. There is no edema or airspace opacity on the left. Interstitial thickening on the left may in part be due to redistribution of blood flow to viable lung segments. Heart is borderline enlarged with pulmonary vascularity normal. No adenopathy. No bone lesions. IMPRESSION: Tube and catheter positions as described without pneumothorax. Subcutaneous air on the right noted. Extensive consolidation throughout much of the right lung consistent with multifocal pneumonia. Interstitial prominence on the left is likely due primarily to redistribution of blood flow to viable lung segments. Heart borderline enlarged. Electronically Signed   By: Bretta Bang III M.D.   On: 05/12/2020 16:46   DG CHEST PORT 1 VIEW  Result Date: 05/11/2020 CLINICAL DATA:  Shortness of breath.  Follow-up hydropneumothorax EXAM: PORTABLE CHEST 1 VIEW COMPARISON:  05/10/2020 FINDINGS: Two right chest tubes remain in place without change in position. Persistent volume loss in the right lung with right lung of opacities corresponding to known hydropneumothorax and consolidations. Subcutaneous  emphysema in the right chest wall and right neck. Focal infiltration or atelectasis in the left mid lung. Normal heart size. No significant change since previous study. IMPRESSION: Right hydropneumothorax with right lung consolidations and subcutaneous emphysema. No change since prior study. Electronically Signed   By: Burman Nieves M.D.   On: 05/11/2020 05:41   DG Chest Port 1 View  Result Date: 05/10/2020 CLINICAL DATA:  Pneumohemothorax EXAM: PORTABLE CHEST 1 VIEW COMPARISON:  05/10/2020 at 7:10 a.m. FINDINGS: Right chest tubes remain in place. Similar degree of subcutaneous emphysema tracking along the right chest and neck. Continued  consolidation in the upper half of the right lung with indistinct airspace opacity in the lower half of the right lung. There is some bandlike density in the lingula which is unchanged. There is little if any residual pneumomediastinum. If there is any residual pneumothorax on the right, it is poorly apparent on today's semi erect portable chest radiograph. IMPRESSION: 1. No appreciable change in the airspace opacity in the right lung, especially favoring the upper half of the right lung. 2. Stable subcutaneous emphysema along the right chest and neck. 3. No pneumothorax or definite pneumomediastinum visible on today's exam. 4. Stable bandlike opacity in the lingula, probably atelectasis. Electronically Signed   By: Gaylyn Rong M.D.   On: 05/10/2020 19:19   DG CHEST PORT 1 VIEW  Result Date: 05/10/2020 CLINICAL DATA:  Shortness of breath. History of pneumothorax. Right chest tubes. EXAM: PORTABLE CHEST 1 VIEW COMPARISON:  05/09/2020. FINDINGS: Two right chest tubes in stable position. Interim near complete resolution of right pneumothorax with possible very tiny residual right apical pneumothorax. Dense right upper lung consolidation again noted without interim change. Low lung volumes. Borderline cardiomegaly. Stable right chest wall subcutaneous emphysema. IMPRESSION: 1. Two right chest tubes in stable position. Interim near complete resolution of right pneumothorax with possible very tiny residual right apical pneumothorax. Stable right chest wall subcutaneous emphysema. 2.  Persistent dense right upper lobe consolidation. 3.  Borderline cardiomegaly. Electronically Signed   By: Maisie Fus  Register   On: 05/10/2020 07:22   DG Chest Port 1 View  Result Date: 05/09/2020 CLINICAL DATA:  Shortness of breath, MRSA pneumonia, RIGHT-side chest tube for small RIGHT pneumothorax EXAM: PORTABLE CHEST 1 VIEW COMPARISON:  Portable exam 0827 hours compared to 05/08/2020 FINDINGS: RIGHT thoracostomy tubes  unchanged. Normal heart size, mediastinal contours, and pulmonary vascularity. Persistent consolidation RIGHT upper lobe with minimal atelectasis versus infiltrate at lung bases bilaterally greater on RIGHT. Persistent small loculated lateral RIGHT mid chest pneumothorax. Extensive RIGHT chest wall emphysema extending into RIGHT cervical region. No pleural effusion. IMPRESSION: RIGHT thoracostomy tubes with minimal residual RIGHT lateral mid chest pneumothorax. Extensive RIGHT upper lobe consolidation with additional atelectasis versus infiltrate at lung bases RIGHT greater than LEFT. Electronically Signed   By: Ulyses Southward M.D.   On: 05/09/2020 10:19   DG CHEST PORT 1 VIEW  Result Date: 05/08/2020 CLINICAL DATA:  Pneumothorax EXAM: PORTABLE CHEST 1 VIEW COMPARISON:  November 28, 2012 FINDINGS: There is a right-sided chest tube in place. The chest tube may be kinked. There is extensive subcutaneous gas along the patient's right chest wall and right neck. There is diffuse opacification of the right upper lung zone as well as the right lower lung zone. There is a probable small right-sided pneumothorax. No evidence for left-sided pneumothorax. The heart size is unremarkable. IMPRESSION: 1. Right-sided chest tube in place.  The chest tube may be kinked. 2. Probable small right-sided pneumothorax.  3. Diffuse opacification of the right upper lung zone and portions of the right lower lobe which may be postsurgical or posttraumatic in etiology. Alternatively, this may represent multifocal pneumonia. 4. Subcutaneous gas is noted along the patient's right chest wall and neck. Electronically Signed   By: Katherine Mantle M.D.   On: 05/08/2020 00:33   CT IMAGE GUIDED DRAINAGE BY PERCUTANEOUS CATHETER  Result Date: 05/08/2020 CLINICAL DATA:  History of COVID pneumonia 2 months ago, right upper lobe MRSA pneumonia, right lower lobe pulmonary emboli, complex right hydropneumothorax, incompletely controlled with surgical  chest tube. Subcutaneous emphysema and pneumomediastinum. Additional chest drain requested. EXAM: CT GUIDED CHEST DRAIN PLACEMENT ANESTHESIA/SEDATION: Intravenous Fentanyl and Versed 1.5mg  were administered as conscious sedation during continuous monitoring of the patient's level of consciousness and physiological / cardiorespiratory status by the radiology RN, with a total moderate sedation time of 18 minutes. PROCEDURE: The procedure, risks, benefits, and alternatives were explained to the patient. Questions regarding the procedure were encouraged and answered. The patient understands and consents to the procedure. Select axial scans through the chest were obtained. The anterior pneumothorax was localized and an appropriate skin entry site was determined and marked. The operative field was prepped with chlorhexidinein a sterile fashion, and a sterile drape was applied covering the operative field. A sterile gown and sterile gloves were used for the procedure. Local anesthesia was provided with 1% Lidocaine. Under CT fluoroscopic guidance, a percutaneous entry needle advanced into the anterior component of the right pneumothorax. Guidewire was advanced apically. Tract dilated to facilitate placement of a 14 French pigtail catheter, directed towards the apex. Catheter was connected to Pleur-evac suction -20 cm H2O. Follow-up scan shows good position of chest tube, partial evacuation of loculated anterior component, no immediate complication. Catheter secured externally with 0 Prolene suture and StatLock and covered with Vaseline gauze and sterile gauze dressing. The patient tolerated the procedure well. COMPLICATIONS: None immediate FINDINGS: Anterior right pneumothorax was localized. 14 pigtail drain catheter placed, directed apically. IMPRESSION: Technically successful CT-guided right chest tube placement. Electronically Signed   By: Corlis Leak M.D.   On: 05/08/2020 17:01   VAS [place] LOWER EXTREMITY VENOUS  (DVT)  Result Date: 05/14/2020  Lower Venous DVT Study Indications: Assess for residual clot. Other Indications: Per pt, no Hx of DVT- unable to see any prior studies                    suggesting DVT. Risk Factors: Recent?. Performing Technologist: Levada Schilling RDMS, RVT  Examination Guidelines: A complete evaluation includes B-mode imaging, spectral Doppler, color Doppler, and power Doppler as needed of all accessible portions of each vessel. Bilateral testing is considered an integral part of a complete examination. Limited examinations for reoccurring indications may be performed as noted. The reflux portion of the exam is performed with the patient in reverse Trendelenburg.  +---------+---------------+---------+-----------+----------+--------------+ RIGHT    CompressibilityPhasicitySpontaneityPropertiesThrombus Aging +---------+---------------+---------+-----------+----------+--------------+ CFV      Full           Yes      Yes                                 +---------+---------------+---------+-----------+----------+--------------+ SFJ      Full                                                        +---------+---------------+---------+-----------+----------+--------------+  FV Prox  Full                                                        +---------+---------------+---------+-----------+----------+--------------+ FV Mid   Full                                                        +---------+---------------+---------+-----------+----------+--------------+ FV DistalFull                                                        +---------+---------------+---------+-----------+----------+--------------+ PFV      Full                                                        +---------+---------------+---------+-----------+----------+--------------+ POP      Full           Yes      Yes                                  +---------+---------------+---------+-----------+----------+--------------+ PTV      Full                                                        +---------+---------------+---------+-----------+----------+--------------+ PERO     Full                                                        +---------+---------------+---------+-----------+----------+--------------+ GSV      Full                                                        +---------+---------------+---------+-----------+----------+--------------+   +---------+---------------+---------+-----------+----------+--------------+ LEFT     CompressibilityPhasicitySpontaneityPropertiesThrombus Aging +---------+---------------+---------+-----------+----------+--------------+ CFV      Full           Yes      Yes                                 +---------+---------------+---------+-----------+----------+--------------+ SFJ      Full                                                        +---------+---------------+---------+-----------+----------+--------------+  FV Prox  Full                                                        +---------+---------------+---------+-----------+----------+--------------+ FV Mid   Full                                                        +---------+---------------+---------+-----------+----------+--------------+ FV DistalFull                                                        +---------+---------------+---------+-----------+----------+--------------+ PFV      Full                                                        +---------+---------------+---------+-----------+----------+--------------+ POP      Full           Yes      Yes                                 +---------+---------------+---------+-----------+----------+--------------+ PTV      Full                                                         +---------+---------------+---------+-----------+----------+--------------+ PERO     Full                                                        +---------+---------------+---------+-----------+----------+--------------+ GSV      Full                                                        +---------+---------------+---------+-----------+----------+--------------+     Summary: RIGHT: - There is no evidence of deep vein thrombosis in the lower extremity.  - No cystic structure found in the popliteal fossa.  LEFT: - There is no evidence of deep vein thrombosis in the lower extremity.  - No cystic structure found in the popliteal fossa.  *See table(s) above for measurements and observations. Electronically signed by Waverly Ferrari MD on 05/14/2020 at 2:06:52 PM.    Final     Microbiology: Recent Results (from the past 240 hour(s))  Surgical pcr screen     Status: Abnormal   Collection Time: 05/11/20 11:55 AM   Specimen: Nasal Mucosa; Nasal  Swab  Result Value Ref Range Status   MRSA, PCR POSITIVE (A) NEGATIVE Final    Comment: RESULT CALLED TO, READ BACK BY AND VERIFIED WITH: RN A TEPE [id number] MLM    Staphylococcus aureus POSITIVE (A) NEGATIVE Final    Comment: (NOTE) The Xpert SA Assay (FDA approved for NASAL specimens in patients 80 years of age and older), is one component of a comprehensive surveillance program. It is not intended to diagnose infection nor to guide or monitor treatment. Performed at [facility], [address] [place], [place] [address]      Labs: Basic Metabolic Panel: Recent Labs  Lab 05/17/20 0110 05/17/20 0110 05/18/20 0236 05/18/20 0455 05/19/20 0159 05/20/20 0246 05/21/20 0045  NA 138  --  138  --  135 138 137  K 3.8  --  4.7  --  4.6 4.7 3.9  CL 93*  --  88*  --  86* 89* 90*  CO2 37*  --  39*  --  42* 41* 37*  GLUCOSE 207*   < > 380* 396* 302* 185* 198*  BUN 12  --  11  --  15 15 15   CREATININE 0.53  --  0.63  --  0.64  0.77 0.67  CALCIUM 7.9*  --  8.7*  --  8.3* 8.4* 8.8*  MG 1.6*  --  1.9  --  1.7 1.9 1.9  PHOS 1.9*  --  4.2  --  4.2 4.2 3.9   < > = values in this interval not displayed.   Liver Function Tests: Recent Labs  Lab 05/17/20 0110 05/18/20 0236 05/19/20 0159 05/20/20 0246 05/21/20 0045  AST 11* 19 19 17  13*  ALT 12 14 15 15 14   ALKPHOS 47 60 58 58 60  BILITOT 0.5 0.6 0.8 0.8 0.8  PROT 4.3* 5.0* 4.7* 4.7* 5.2*  ALBUMIN 1.5* 1.7* 1.7* 1.8* 1.9*   No results for input(s): LIPASE, AMYLASE in the last 168 hours. No results for input(s): AMMONIA in the last 168 hours. CBC: Recent Labs  Lab 05/17/20 0110 05/17/20 0110 05/17/20 1113 05/18/20 0236 05/19/20 0159 05/20/20 0246 05/21/20 0045  WBC 4.6  --   --  3.8* 4.2 3.8* 3.9*  NEUTROABS 2.9  --   --  3.1 2.4 2.1 2.4  HGB 7.6*   < > 8.2* 8.6* 8.1* 8.0* 8.9*  HCT 25.2*   < > 26.9* 27.2* 26.5* 26.7* 28.0*  MCV 93.7  --   --  91.9 94.6 96.0 92.7  PLT 77*  --   --  77* 78* 97* 100*   < > = values in this interval not displayed.   Cardiac Enzymes: No results for input(s): CKTOTAL, CKMB, CKMBINDEX, TROPONINI in the last 168 hours. BNP: BNP (last 3 results) No results for input(s): BNP in the last 8760 hours.  ProBNP (last 3 results) No results for input(s): PROBNP in the last 8760 hours.  CBG: Recent Labs  Lab 05/20/20 2027 05/20/20 2232 05/20/20 2352 05/21/20 0424 05/21/20 0747  GLUCAP 252* 224* 225* 183* 163*       Signed:  Rhetta Mura MD   [facility] 05/21/2020, 9:44 AM

## 2020-05-22 ENCOUNTER — Inpatient Hospital Stay (HOSPITAL_COMMUNITY): Payer: Medicare Other

## 2020-05-22 DIAGNOSIS — J15212 Pneumonia due to Methicillin resistant Staphylococcus aureus: Secondary | ICD-10-CM | POA: Diagnosis not present

## 2020-05-22 DIAGNOSIS — J95811 Postprocedural pneumothorax: Secondary | ICD-10-CM

## 2020-05-22 LAB — MAGNESIUM: Magnesium: 1.8 mg/dL (ref 1.7–2.4)

## 2020-05-22 LAB — CBC WITH DIFFERENTIAL/PLATELET
Abs Immature Granulocytes: 0.04 10*3/uL (ref 0.00–0.07)
Basophils Absolute: 0 10*3/uL (ref 0.0–0.1)
Basophils Relative: 0 %
Eosinophils Absolute: 0.2 10*3/uL (ref 0.0–0.5)
Eosinophils Relative: 4 %
HCT: 29.9 % — ABNORMAL LOW (ref 36.0–46.0)
Hemoglobin: 9.3 g/dL — ABNORMAL LOW (ref 12.0–15.0)
Immature Granulocytes: 1 %
Lymphocytes Relative: 26 %
Lymphs Abs: 1.3 10*3/uL (ref 0.7–4.0)
MCH: 29.1 pg (ref 26.0–34.0)
MCHC: 31.1 g/dL (ref 30.0–36.0)
MCV: 93.4 fL (ref 80.0–100.0)
Monocytes Absolute: 0.4 10*3/uL (ref 0.1–1.0)
Monocytes Relative: 8 %
Neutro Abs: 3.2 10*3/uL (ref 1.7–7.7)
Neutrophils Relative %: 61 %
Platelets: 144 10*3/uL — ABNORMAL LOW (ref 150–400)
RBC: 3.2 MIL/uL — ABNORMAL LOW (ref 3.87–5.11)
RDW: 20.1 % — ABNORMAL HIGH (ref 11.5–15.5)
WBC: 5.2 10*3/uL (ref 4.0–10.5)
nRBC: 0 % (ref 0.0–0.2)

## 2020-05-22 LAB — GLUCOSE, CAPILLARY
Glucose-Capillary: 218 mg/dL — ABNORMAL HIGH (ref 70–99)
Glucose-Capillary: 157 mg/dL — ABNORMAL HIGH (ref 70–99)
Glucose-Capillary: 156 mg/dL — ABNORMAL HIGH (ref 70–99)
Glucose-Capillary: 144 mg/dL — ABNORMAL HIGH (ref 70–99)
Glucose-Capillary: 128 mg/dL — ABNORMAL HIGH (ref 70–99)
Glucose-Capillary: 143 mg/dL — ABNORMAL HIGH (ref 70–99)
Glucose-Capillary: 114 mg/dL — ABNORMAL HIGH (ref 70–99)
Glucose-Capillary: 151 mg/dL — ABNORMAL HIGH (ref 70–99)

## 2020-05-22 LAB — HEPARIN LEVEL (UNFRACTIONATED): Heparin Unfractionated: <0.1 IU/mL — ABNORMAL LOW (ref 0.30–0.70)

## 2020-05-22 LAB — PHOSPHORUS: Phosphorus: 4.5 mg/dL (ref 2.5–4.6)

## 2020-05-22 MED ORDER — MIDAZOLAM HCL 2 MG/2ML IJ SOLN
INTRAMUSCULAR | Status: AC
  Administered 2020-05-22: 1 mg via INTRAVENOUS
  Filled 2020-05-22: qty 2

## 2020-05-22 MED ORDER — LIDOCAINE HCL (PF) 2 % IJ SOLN: 20.0000 mL | Freq: Once | INTRAMUSCULAR | Status: DC

## 2020-05-22 MED ORDER — INSULIN GLARGINE 100 UNIT/ML ~~LOC~~ SOLN
15.0000 [IU] | Freq: Every day | SUBCUTANEOUS | Status: DC
  Administered 2020-05-22 – 2020-05-23 (×2): 15 [IU] via SUBCUTANEOUS
  Filled 2020-05-22 (×4): qty 0.15

## 2020-05-22 MED ORDER — LIDOCAINE HCL (PF) 1 % IJ SOLN
INTRAMUSCULAR | Status: AC
  Filled 2020-05-22: qty 5

## 2020-05-22 MED ORDER — MORPHINE SULFATE (PF) 2 MG/ML IV SOLN
2.0000 mg | Freq: Once | INTRAVENOUS | Status: AC
  Administered 2020-05-22: 2 mg via INTRAVENOUS

## 2020-05-22 MED ORDER — HYDROMORPHONE HCL 1 MG/ML IJ SOLN
INTRAMUSCULAR | Status: AC
  Administered 2020-05-22: 1 mg via INTRAVENOUS
  Filled 2020-05-22: qty 1

## 2020-05-22 MED ORDER — LIDOCAINE HCL 2 % IJ SOLN
20.0000 mL | INTRAMUSCULAR | Status: AC
  Administered 2020-05-22: 400 mg via INTRADERMAL
  Filled 2020-05-22: qty 20

## 2020-05-22 MED ORDER — LIDOCAINE HCL (PF) 1 % IJ SOLN: 30.0000 mL | Freq: Once | INTRAMUSCULAR | Status: AC

## 2020-05-22 MED ORDER — HYDROMORPHONE HCL 1 MG/ML IJ SOLN
0.5000 mg | Freq: Once | INTRAMUSCULAR | Status: AC
  Administered 2020-05-22: 0.5 mg via INTRAVENOUS
  Filled 2020-05-22: qty 0.5

## 2020-05-22 MED ORDER — MIDAZOLAM HCL 2 MG/2ML IJ SOLN: 1.0000 mg | Freq: Once | INTRAMUSCULAR | Status: AC

## 2020-05-22 MED ORDER — MORPHINE SULFATE (PF) 2 MG/ML IV SOLN
2.0000 mg | INTRAVENOUS | Status: DC | PRN
  Administered 2020-05-22 – 2020-05-23 (×4): 2 mg via INTRAVENOUS
  Filled 2020-05-22 (×5): qty 1

## 2020-05-22 MED ORDER — LIDOCAINE HCL (PF) 1 % IJ SOLN
INTRAMUSCULAR | Status: AC
  Administered 2020-05-22: 30 mL
  Filled 2020-05-22: qty 30

## 2020-05-22 MED ORDER — MIDAZOLAM HCL 2 MG/2ML IJ SOLN
1.0000 mg | Freq: Once | INTRAMUSCULAR | Status: AC
  Administered 2020-05-22: 1 mg via INTRAVENOUS

## 2020-05-22 MED ORDER — HYDROMORPHONE HCL 1 MG/ML PO LIQD: 1.0000 mg | Freq: Once | ORAL | Status: DC

## 2020-05-22 NOTE — Progress Notes (Addendum)
05/22/2020   I have seen and evaluated the patient for pneumothorax.  S:  Patient currently getting second chest tube placed by Dr. Vickey Sages for pneumothorax and extensive subcutaneous emphysema.  O: Blood pressure 112/82, pulse 95, temperature 98.4 F (36.9 C), temperature source Oral, resp. rate 18, height 5\' 8"  (1.727 m), weight 100.1 kg, SpO2 96 %.  Mentating Extensive subcutaneous air Heart sounds distant/tachycardic  A:  Bilateral pneumothorax with extensive subcutaneous air Recent right VATS/pleurodesis with evacuation of chest wall hematoma from chest tube placed at OSH PE MRSA pneumonia s/p abx S/p COVID COPD DM2  P:  - Hold AC for time being as tube insertions were pretty traumatic - Agree with chest tubes to suction, CT chest pending, may need OR again - IS, flutter - Cut down lantus - Will take over while patient in 2h  05/22/2020 14/01/2020 MD     NAME:  Kynadi Dragos, MRN:  [name], DOB:  06-30-1960, LOS: 15 ADMISSION DATE:  05/07/2020, CONSULTATION DATE: 05/21/2020 REFERRING MD:  Dr 14/12/2019, CHIEF COMPLAINT: Subcuticular air  Brief History   59 year old female with a history of Covid pneumonia since resolved that is still dealing with the complications.  She has had recurrent pneumothorax.  She developed a pneumothorax on the right side 12/7 and required chest tube placement. ->moved to ICU as had sig [place] air and was concern for airway protection  Past Medical History  COVID-19 pneumonia MRSA pneumonia COPD Hypertension Hyperlipidemia Subsegmental pulmonary embolism on DOAC Right pneumohemothorax requiring VATS w/ pleurodesis. Complicated by right chest wall hematoma   Significant Hospital Events   Development of right pneumothorax on 12/7, seen by thoracic surgery chest tube placed.  Moved to the intensive care given concern about possible respiratory failure 12/8: Chest x-ray fairly stable but still has massive subcutaneous air, the right chest tube is  malpositioned with side-port of the chest wall thoracic surgery planning on replacing chest tube   Procedures:  VATS procedure 05/06/2020 at outside facility 05/20/2020 chest tube removed 05/21/2020 right-sided chest tube placed (CVTS)   Micro Data:  COVID-19 negative currently  Antimicrobials:  None  Subjective Reports very anxious about pending procedure Objective   Blood pressure 116/70, pulse 86, temperature 98.8 F (37.1 C), temperature source Oral, resp. rate 13, height 5\' 8"  (1.727 m), weight 100.1 kg, SpO2 97 %.        Intake/Output Summary (Last 24 hours) at 05/22/2020 0936 Last data filed at 05/22/2020 0700 Gross per 24 hour  Intake 480 ml  Output 968 ml  Net -488 ml   Filed Weights   05/21/20 0427 05/22/20 0042 05/22/20 0431  Weight: 100.2 kg 100.1 kg 100.1 kg    Examination: General 59 year old white female very anxious currently but not in acute distress HEENT Marked subcutaneous air extending into the neck and face also over upper chest shoulders and thorax mucous membranes moist Pulmonary: Basilar rales, chest tube without air leak, approximately 90 cc of bloody output on the right side, marked subcutaneous air as mentioned above Cardiac regular rate and rhythm Abdomen soft not tender Extremities are warm and dry Neuro awake oriented but anxious GU clear yellow  Resolved Hospital Problem list     Assessment & Plan:  Recurrent Bilateral pneumothorax w/ massive [place] air. 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 There continues to be massive [place] air. There is a small left PTX. There is sig on-going right sided pleural disease which represents a  mix of hemothorax and probable fibrothorax post pleurodesis  Plan Cont chest tube to sxn Will defer to to thoracic surgery but might benefit from replacement of CT w/ side port in chest wall. (they are planning to replace it) No role for left chest tube currently  Can move out if ICU once chest tube completed. 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 at this moment but as her condition stabilizes would resume p.o. intake. Pain/Anxiety/Delirium protocol (if indicated): Tramadol and morphine VAP protocol (if indicated): N/A DVT prophylaxis: SCDs only GI prophylaxis: Protonix Glucose control: Insulin sliding scale Mobility: Bedrest Code Status: Partial DNR Goals of care discussion due 12/15 Disposition: sdu when ok w. Thoracic surg  Critical care time:NA    We will sign off  Simonne Martinet ACNP-BC [facility] Pulmonary/Critical Care Pager # [phone] OR # [phone] if no answer

## 2020-05-22 NOTE — TOC Progression Note (Signed)
Transition of Care [facility]) - Progression Note    Patient Details  Name: Annette [name] MRN: [id number] Date of Birth: [birth year]  Transition of Care [facility]) CM/SW Contact  Eduard Roux, [place] Phone Number: 05/22/2020, 12:14 PM  Clinical Narrative:     Janyce Llanos, canceled insurance authorization request for SNF.  Antony Blackbird, MSW, LCSWA Clinical Social Worker   Expected Discharge Plan: Skilled Nursing Facility Barriers to Discharge: Insurance Authorization, SNF Pending bed offer  Expected Discharge Plan and Services Expected Discharge Plan: Skilled Nursing Facility In-house Referral: Clinical Social Work       Expected Discharge Date: 05/21/20                                     Social Determinants of Health (SDOH) Interventions    Readmission Risk Interventions No flowsheet data found.

## 2020-05-22 NOTE — Progress Notes (Signed)
Patient transfer via bed with O2 and monitor . Annette [name]. help with transfer. Patient transfer to 2 H 14 with no complaints. Patients belongings sent with patient.Annette [name] R.N. is aware.

## 2020-05-22 NOTE — Procedures (Signed)
Insertion of Chest Tube Procedure Note  Bernece Gall  [id number]  [birth year]  Date:05/22/20  Time:11:32 AM    Provider Performing: [name]   Procedure: Chest Tube Insertion (32551)  Indication(s) Pneumothorax  Consent Risks of the procedure as well as the alternatives and risks of each were explained to the patient and/or caregiver.  Consent for the procedure was obtained and is signed in the bedside chart  Anesthesia 2% lidocaine   Time Out Verified patient identification, verified procedure, site/side was marked, verified correct patient position, special equipment/implants available, medications/allergies/relevant history reviewed, required imaging and test results available.   Sterile Technique Maximal sterile technique including full sterile barrier drape, hand hygiene, sterile gown, sterile gloves, mask, hair covering, sterile ultrasound probe cover (if used).   Procedure Description Ultrasound not used to identify appropriate pleural anatomy for placement and overlying skin marked. Area of placement cleaned and draped in sterile fashion.  A 32 French chest tube was placed into the right pleural space using Kelly dissection. Appropriate return of fluid was obtained.  The tube was connected to atrium and placed on -20 cm H2O wall suction.   Complications/Tolerance None; patient tolerated the procedure well. Chest X-ray is ordered to verify placement.   EBL Minimal  Specimen(s) none

## 2020-05-22 NOTE — Progress Notes (Signed)
Daily Progress Note   Patient Name: Annette [name]       Date: 05/22/2020 DOB: 26-Jul-1960  Age: 59 y.o. MRN#: [id number] Attending Physician: Lorin Glass, MD Primary Care Physician: Patient, No Pcp Per Admit Date: 05/07/2020  Reason for Consultation/Follow-up: Establishing goals of care, Non pain symptom management and Pain control  Subjective: Sleeping - does not waken to light touch or voice  Length of Stay: 15  Current Medications: Scheduled Meds:  . atorvastatin  40 mg Oral Daily  . bisacodyl  10 mg Oral Daily  . Chlorhexidine Gluconate Cloth  6 each Topical Daily  . cholecalciferol  1,000 Units Oral Daily  . clonazePAM  0.5 mg Oral BID  . fenofibrate  160 mg Oral Daily  . furosemide  40 mg Intravenous BID  . Gerhardt's butt cream   Topical BID  . guaiFENesin  600 mg Oral BID  . insulin aspart  0-15 Units Subcutaneous Q4H  . insulin glargine  15 Units Subcutaneous QHS  . lidocaine  1 patch Transdermal Q24H  . multivitamin with minerals  1 tablet Oral Daily  . nystatin   Topical BID  . pregabalin  150 mg Oral TID  . senna-docusate  1 tablet Oral QHS  . umeclidinium bromide  1 puff Inhalation Daily    Continuous Infusions:   PRN Meds: influenza vac split quadrivalent PF, levalbuterol, magnesium citrate, morphine injection, ondansetron (ZOFRAN) IV, oxyCODONE, pneumococcal 23 valent vaccine, polyethylene glycol, traMADol  Physical Exam Constitutional:      Comments: Sleeping does not arouse to voice or light touch - per RN A&O when awake  Pulmonary:     Effort: Pulmonary effort is normal.  Skin:    General: Skin is warm and dry.             Vital Signs: BP 108/73   Pulse 94   Temp 98.4 F (36.9 C) (Oral)   Resp 15   Ht 5\' 8"  (1.727 m)   Wt 100.1 kg   SpO2 100%   BMI  33.55 kg/m  SpO2: SpO2: 100 % O2 Device: O2 Device: High Flow Nasal Cannula O2 Flow Rate: O2 Flow Rate (L/min): 8 L/min  Intake/output summary:   Intake/Output Summary (Last 24 hours) at 05/22/2020 1507 Last data filed at 05/22/2020 1200 Gross per 24 hour  Intake 480 ml  Output 1008 ml  Net -528 ml   LBM: Last BM Date: 05/21/20 Baseline Weight: Weight: 117.9 kg Most recent weight: Weight: 100.1 kg       Palliative Assessment/Data: PPS 50%      Patient Active Problem List   Diagnosis Date Noted  . Palliative care by specialist   . Goals of care, counseling/discussion   . DNR (do not resuscitate)   . Pressure injury of skin 05/08/2020  . MRSA pneumonia (HCC) 05/07/2020  . Pulmonary embolism (HCC) 05/07/2020  . Pneumothorax 05/07/2020  . Acute hypoxemic respiratory failure (HCC) 05/07/2020  . COPD (chronic obstructive pulmonary disease) (HCC) 05/07/2020    Palliative Care Assessment & Plan   HPI: Per intake H&P -->59 yr WF withrecent COVID-19about 2 months ago, recent PE on Eliquis, COPD/Asthma not on oxygen, DM-2, HTN and HLD.Admitted for RUL MRSA  pneumonia and RLL subsegmental PE(TTE with mod PAH but no right heart strain) treated with vancomycin, then Zyvox, and Lovenox. She developed right-sidedhydropneumothorax on 11/22 underwent chest tube insertion on 05/06/2020 and transferred to [facility] for CTS evaluation and treatment.  Palliative care was asked to get involved to further discuss goals of care.  Assessment: Asked to f/u for symptom management. PCA has been d/c'd. Now receiving oxy IR PO PRN with IV morphine for breakthrough. Patient sleeping soundly. Has only utilized morphine once this AM. Per RN, pain controlled with medications.  Had second chest tube placed this AM. Plan for chest CT later today.   Recommendations/Plan:  Intubation only, NO CPR  Completed MOST on chart  Patient receives outpatient palliative care through [facility]"  At this point, pain seems to be adequately controlled  Will shadow chart - please call PMT at [phone] with additional concerns  Care plan was discussed with RN  Thank you for allowing the Palliative Medicine Team to assist in the care of this patient.   Total Time 15 minutes Prolonged Time Billed  no       Greater than 50%  of this time was spent counseling and coordinating care related to the above assessment and plan.  Gerlean Ren, DNP, [facility] Palliative Medicine Team Team Phone # [phone]  Pager [phone]

## 2020-05-22 NOTE — Progress Notes (Signed)
PROGRESS NOTE    Annette [name]  HGD:[id number] DOB: 1961-06-09 DOA: 05/07/2020 PCP: Patient, No Pcp Per     Brief Narrative:  59 yr WF with recent COVID-19 about 2 months ago, recent PE on Eliquis, COPD/Asthma not on oxygen, DM-2, HTN and HLD. Admitted to [facility] on 05/03/2020 where she was admitted for RUL MRSA pneumonia and RLL subsegmental PE (TTE with mod PAH but no right heart strain) treated with vancomycin, then Zyvox, and Lovenox.  She developed right-sidedhydropneumothorax on 11/22 underwent chest tube insertion on 05/06/2020 and transferred to [facility] for CTS evaluation and treatment. On arrival here, CT chest without contrast with moderately large right pneumothorax, subcutaneous emphysema throughout the right chest wall extending into right neck, pneumomediastinum, right upper lobe opacity and emphysema. IR consulted by CTS and placed additional chest tube to right chest on 05/08/2020.  On 11/28, patient underwent VATS/thoracotomy mechanical pleurodesis and wound VAC placement, and sent to ICU out of concern about her respiratory status but remained stable 4 L oxygen by nasal cannula. On 11/30, transferred out of ICU to progressive unit.  Heparin resumed.  Also started on IV Lasix by CTS. On 12/7, pt was subsequently transferred to ICU due to sudden onset severe subcutaneous emphysema with chest x-ray showing recurrent pneumothorax and concern for airway protection.  CTS placed chest tube on 05/21/2020.   Subjective: Patient denies any worsening shortness of breath, still with significant subcutaneous air.  Reports pain around the chest tube insertion site, denies any left-sided chest pain, abdominal pain, nausea/vomiting, fever/chills.     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 S/p VATS/thoracotomy mechanical pleurodesis CTS on board, placed right-sided chest tube on 12/7, replaced on 12/8 due to persistent extensive subcutaneous air Continue Lasix twice daily Continue bronchodilators, oxygen supplementation IS/flutter valve PCCM on board, appreciate recs  Acute hypoxic respiratory failure Likely 2/2 above Continue supplemental O2  Large right flank hematoma measuring about 12 cm- S/p evacuation and wound VAC  Acute blood loss anemia 11/27 transfuse 2 units PRBC 11/28 transfuse 2 units PRBC  Right upper lobe MRSA pneumonia/Aspiration pneumonia Patient has completed antibiotics for MRSA/aspiration pneumonia.  Acute RLL subsegmental PE:  TTE at OSH with moderate PAH but no right heart strain. 11/30 LE venous Doppler.  Negative for DVT Hold ACE due to recurrent tube insertions, chest wall hematoma  DM type II uncontrolled with hyperglycemia, neuropathy 11/24 hemoglobin A1c= 10.7 Continue SSI, Lantus, NovoLog Continue Lyrica  Chronic COPD  Continue bronchodilators  Hyperlipidemia Continue Lipitor and fenofibrate  GERD Continue PPI  Anxiety Continue home Klonopin  History of COVID-19 infection Treated for this about 2 months ago  Thrombocytopenia:  Ongoing Currently no signs of overt bleeding Daily CBC  Intertrigo/cutaneous candidiasis S/p Diflucan 150 mg every 72 hours x2 Continue topical nystatin powder  Obesity Lifestyle modification advised  Stage II pressure skin injury: POA Pressure Injury 05/07/20 Coccyx Medial;Mid Stage 2 -  Partial thickness loss of dermis presenting as a shallow open injury with a red, pink wound bed without slough. (Active)  05/07/20 2100  Location: Coccyx  Location Orientation: Medial;Mid  Staging: Stage 2 -  Partial thickness loss of dermis presenting as a shallow open injury with a red, pink wound bed without slough.  Wound Description (Comments):   Present on Admission: Yes   Goals of care 12/2 Palliative Care consulted Partial code   DVT prophylaxis: Held anticoagulation for now Code Status: Partial code Family Communication: None at bedside Status is: Inpatient    Dispo: The patient is from: Home              Anticipated d/c is to:??              Anticipated d/c date is: > 3 days              Patient currently unstable      Consultants:  IR Cardiothoracic surgery PCCM   Procedures/Significant Events:  05/06/2020-right chest tube placement at outside hospital 05/08/2020-second right chest tube placement by IR  05/12/2020-VATS/thoracotomy mechanical pleurodesis and wound VAC placement 11/30 bilateral lower extremity Doppler; negative DVT 12/1 PCXR-extensive airspace consolidation throughout much of the right lung, stable. -Left base atelectasis. No new opacity appreciable. Chest tubes remain on the right, unchanged in position, without pneumothorax. Stable cardiac silhouette. 12/3 RIGHT CHEST WALL WOUND CLOSURE/removal wound VAC by cardiothoracic surgery   Cultures Blood cultures NGTD. MRSA PCR positive.   Antimicrobials: Anti-infectives (From admission, onward)   Start     Ordered Stop   05/10/20 1300  fluconazole (DIFLUCAN) tablet 150 mg        05/10/20 1151 05/13/20 1408   05/10/20 0900  Ampicillin-Sulbactam (UNASYN) 3 g in sodium chloride 0.9 % 100 mL IVPB        05/10/20 0753 05/17/20 0859   05/08/20 0030  linezolid (ZYVOX) tablet 600 mg  Status:  Discontinued        05/08/20 0015 05/14/20 1113        Objective: Vitals:   05/22/20 1115 05/22/20 1130 05/22/20 1230 05/22/20 1245  BP: 113/80 108/72 112/82 114/74  Pulse: 89 88 95 93  Resp: (!) 26 18 18 17    Temp:  98.4 F (36.9 C)    TempSrc:  Oral    SpO2: (!) 88% 91% 96% 98%  Weight:      Height:        Intake/Output Summary (Last 24 hours) at 05/22/2020 1309 Last data filed at 05/22/2020 0700 Gross per 24 hour  Intake 480 ml  Output 968 ml  Net -488 ml   Filed Weights   05/21/20 0427 05/22/20 0042 05/22/20 0431  Weight: 100.2 kg 100.1 kg 100.1 kg    Examination:  General: NAD, noted extensive subcutaneous air extending into the neck, upper chest  Cardiovascular: S1, S2 present, distant  Respiratory:  Diminished breath sounds  Abdomen: Soft, nontender, nondistended, bowel sounds present  Musculoskeletal: No bilateral pedal edema noted  Skin: Normal  Psychiatry: Normal mood   CBC: Recent Labs  Lab 05/18/20 0236 05/19/20 0159 05/20/20 0246 05/21/20 0045 05/22/20 0721  WBC 3.8* 4.2 3.8* 3.9* 5.2  NEUTROABS 3.1 2.4 2.1 2.4 3.2  HGB 8.6* 8.1* 8.0* 8.9* 9.3*  HCT 27.2* 26.5* 26.7* 28.0* 29.9*  MCV 91.9 94.6 96.0 92.7 93.4  PLT 77* 78* 97* 100* 144*   Basic Metabolic Panel: Recent Labs  Lab 05/17/20 0110 05/17/20 0110 05/18/20 0236 05/18/20 0236 05/18/20 0455 05/19/20 0159 05/20/20 0246 05/21/20 0045 05/21/20 1858 05/22/20 0721  NA 138  --  138  --   --  135 138 137  --   --   K 3.8  --  4.7  --   --  4.6 4.7 3.9  --   --   CL 93*  --  88*  --   --  86* 89* 90*  --   --   CO2 37*  --  39*  --   --  42* 41* 37*  --   --   GLUCOSE 207*   < > 380*   < > 396* 302* 185* 198* 422*  --   BUN 12  --  11  --   --  15 15 15   --   --   CREATININE 0.53  --  0.63  --   --  0.64 0.77 0.67  --   --   CALCIUM 7.9*  --  8.7*  --   --  8.3* 8.4* 8.8*  --   --   MG 1.6*   < > 1.9  --   --  1.7 1.9 1.9  --  1.8  PHOS 1.9*   < > 4.2  --   --  4.2 4.2 3.9  --  4.5   < > = values in this interval not displayed.   GFR: Estimated Creatinine Clearance: 92.6 mL/min (by C-G formula based on SCr of 0.67 mg/dL). Liver Function Tests: Recent Labs  Lab 05/17/20 0110  05/18/20 0236 05/19/20 0159 05/20/20 0246 05/21/20 0045  AST 11* 19 19 17  13*  ALT 12 14 15 15 14   ALKPHOS 47 60 58 58 60  BILITOT 0.5 0.6 0.8 0.8 0.8  PROT 4.3* 5.0* 4.7* 4.7* 5.2*  ALBUMIN 1.5* 1.7* 1.7* 1.8* 1.9*   No results for input(s): LIPASE, AMYLASE in the last 168 hours. No results for input(s): AMMONIA in the last 168 hours. Coagulation Profile: No results for input(s): INR, PROTIME in the last 168 hours. Cardiac Enzymes: No results for input(s): CKTOTAL, CKMB, CKMBINDEX, TROPONINI in the last 168 hours. BNP (last 3 results) No results for input(s): PROBNP in the last 8760 hours. HbA1C: No results for input(s): HGBA1C in the last 72 hours. CBG: Recent Labs  Lab 05/21/20 1950 05/21/20 2303 05/22/20 0127 05/22/20 0338 05/22/20 0654  GLUCAP 400* 218* 157* 156* 144*   Lipid Profile: No results for input(s): CHOL, HDL, LDLCALC, TRIG, CHOLHDL, LDLDIRECT in the last 72 hours. Thyroid Function Tests: No results for input(s): TSH, T4TOTAL, FREET4, T3FREE, THYROIDAB in the last 72 hours. Anemia Panel: Recent Labs    05/21/20 0826  VITAMINB12 284  FOLATE 8.8  FERRITIN 589*  TIBC 308  IRON 60  RETICCTPCT 12.2*   Sepsis Labs: No results for input(s): PROCALCITON, LATICACIDVEN in the last 168 hours.  Recent Results (from the past 240 hour(s))  SARS Coronavirus 2 by RT PCR (hospital order, performed in [facility] hospital lab) Nasopharyngeal Nasopharyngeal Swab     Status: None   Collection Time: 05/20/20  3:51 PM   Specimen: Nasopharyngeal Swab  Result Value Ref Range Status   SARS Coronavirus 2 NEGATIVE NEGATIVE Final    Comment: (NOTE) SARS-CoV-2 target nucleic acids are NOT DETECTED.  The SARS-CoV-2 RNA is generally detectable in upper and lower respiratory specimens during the acute phase of infection. The lowest concentration of SARS-CoV-2 viral copies this assay can detect is 250 copies / mL. A negative result does not preclude SARS-CoV-2  infection and should not be used as the sole basis for treatment or other patient management decisions.  A  negative result may occur with improper specimen collection / handling, submission of specimen other than nasopharyngeal swab, presence of viral mutation(s) within the areas targeted by this assay, and inadequate number of viral copies (<250 copies / mL). A negative result must be combined with clinical observations, patient history, and epidemiological information.  Fact Sheet for Patients:   BoilerBrush.com.cy  Fact Sheet for Healthcare Providers: https://pope.com/  This test is not yet approved or  cleared by the [place] FDA and has been authorized for detection and/or diagnosis of SARS-CoV-2 by FDA under an Emergency Use Authorization (EUA).  This EUA will remain in effect (meaning this test can be used) for the duration of the COVID-19 declaration under Section 564(b)(1) of the Act, 21 U.S.C. section 360bbb-3(b)(1), unless the authorization is terminated or revoked sooner.  Performed at [facility], [address], [place], [place] [address]          Radiology Studies: DG Chest 1 View  Result Date: 05/22/2020 CLINICAL DATA:  Pneumothorax on right. Additional history provided: VATS/thoracotomy 05/12/2020. EXAM: CHEST  1 VIEW COMPARISON:  Chest radiograph performed earlier today at 5:19 a.m. FINDINGS: Apparent slight interval retraction of the more superiorly located right-sided chest tube with the side port just external to the chest wall. Interval placement of an additional chest tube projecting in the region of the right lung base. Interval progression of opacity at the right lung base which may reflect atelectasis. Redemonstrated extensive airspace disease throughout the remainder of the right lung. No appreciable right pneumothorax. Unchanged small left pneumothorax. Heart size within normal limits.  Redemonstrated extensive subcutaneous emphysema. Impression 1 below will be called to the ordering clinician or representative by the Radiologist Assistant, and communication documented in the PACS or [employer]. IMPRESSION: Slight interval retraction of the more superiorly located right chest tube, now with the side port positioned just external to the chest wall. Interval placement of a second chest tube projecting in the region of the right lung base. No appreciable right pneumothorax Interval progression of opacity at the right lung base, which may reflect atelectasis. Redemonstrated extensive airspace disease throughout the remainder of the right lung. Unchanged small left pneumothorax. Redemonstrated extensive subcutaneous emphysema. Electronically Signed   By: Jackey Loge DO   On: 05/22/2020 12:21   DG Chest 2 View  Result Date: 05/21/2020 CLINICAL DATA:  Chest pain, shortness of breath. EXAM: CHEST - 2 VIEW COMPARISON:  May 20, 2020. FINDINGS: The heart size and mediastinal contours are within normal limits. No pneumothorax is noted. Stable right lung opacity is noted, most prominently the right upper lobe, most consistent with pneumonia. Small right pleural effusion may be present. Stable mild left basilar atelectasis or infiltrate is noted. The visualized skeletal structures are unremarkable. IMPRESSION: Stable right upper lobe pneumonia. Small right pleural effusion may be present. Stable mild left basilar atelectasis or infiltrate is noted. Electronically Signed   By: Lupita Raider M.D.   On: 05/21/2020 08:02   DG Chest Port 1 View  Result Date: 05/22/2020 CLINICAL DATA:  Chest tube, status post pneumothorax. Shortness of breath. EXAM: PORTABLE CHEST 1 VIEW COMPARISON:  05/21/2020 FINDINGS: Right chest tube remains in stable position with the side port near the chest wall. Extensive subcutaneous emphysema again noted. Small left pneumothorax is stable. No visible right pneumothorax.  Extensive airspace disease throughout the right lung, unchanged. Heart is normal size. IMPRESSION: Stable extensive subcutaneous emphysema with the right chest tube remaining in stable position. The side  port remains at/near the chest wall. Small left pneumothorax, stable. Stable right lung airspace disease. Electronically Signed   By: KeviCharlett NoseM.D.   On: 05/22/2020 07:39   DG CHEST PORT 1 VIEW  Result Date: 05/21/2020 CLINICAL DATA:  Shortness of breath EXAM: PORTABLE CHEST 1 VIEW COMPARISON:  X-ray earlier in the same day FINDINGS: Extensive subcutaneous edema is again noted. This is substantially worsened since the prior study. Diffuse right mid lung field airspace consolidation is noted. The right-sided chest tube is poorly position with the side hole straddling the right chest wall. There is a small to moderate-sized left-sided pneumothorax. The heart size is stable. Aortic calcifications are noted. IMPRESSION: 1. Significant interval worsening of subcutaneous emphysema. 2. Suboptimal right-sided chest tube positioning with the main side hole likely straddling the chest wall. 3. Small to moderate-sized left-sided pneumothorax, new from prior study. A right-sided pneumothorax cannot be excluded secondary to extensive subcutaneous emphysema. 4. Persistent diffuse airspace opacities throughout the right lung field, relatively similar to prior study. These results will be called to the ordering clinician or representative by the Radiologist Assistant, and communication documented in the PACS or [employer]. Electronically Signed   By: Katherine Mantle M.D.   On: 05/21/2020 22:47   DG Chest Port 1 View  Result Date: 05/21/2020 CLINICAL DATA:  Sudden onset right chest pain with swelling and short of breath EXAM: PORTABLE CHEST 1 VIEW COMPARISON:  05/21/2020, 05/20/2020, 05/19/2020 FINDINGS: Interval development of large amount of subcutaneous emphysema within the right chest wall and neck. Possible  trace right apical pneumothorax though difficult to appreciate given overlying subcutaneous emphysema. Probable small amount of pneumomediastinum. Dense right apical consolidation as before. Small right-sided pleural effusion. Increasing airspace opacities at the right greater than left lung base. Stable cardiomediastinal silhouette. IMPRESSION: 1. Interval development of large amount of subcutaneous emphysema within the right chest wall and neck. 2. Possible trace right apical pneumothorax though difficult to appreciate given overlying subcutaneous emphysema. Probable small amount of pneumomediastinum. 3. Persistent dense right apical lung consolidation with increasing airspace disease at the right greater than left lung bases. Probable small right effusion. Electronically Signed   By: Jasmine Pang M.D.   On: 05/21/2020 19:28   DG Chest Port 1 View  Result Date: 05/20/2020 CLINICAL DATA:  Status post chest tube removal. EXAM: PORTABLE CHEST 1 VIEW COMPARISON:  05/20/2020 at 0552 hours FINDINGS: The 3 right-sided chest tubes on the prior study have been removed. No pneumothorax is identified. Right lung airspace opacities including dense consolidation in the apex are unchanged. Mild opacities in the left mid and lower lung are also unchanged. There is likely a small right pleural effusion. IMPRESSION: 1. Interval removal of right chest tubes. No pneumothorax. 2. Unchanged bilateral airspace opacities including dense right apical consolidation. Electronically Signed   By: Sebastian Ache M.D.   On: 05/20/2020 16:22        Scheduled Meds: . (feeding supplement) PROSource Plus  30 mL Oral BID BM  . atorvastatin  40 mg Oral Daily  . bisacodyl  10 mg Oral Daily  . Chlorhexidine Gluconate Cloth  6 each Topical Daily  . cholecalciferol  1,000 Units Oral Daily  . clonazePAM  0.5 mg Oral BID  . fenofibrate  160 mg Oral Daily  . furosemide  40 mg Intravenous BID  . Gerhardt's butt cream   Topical BID  .  guaiFENesin  600 mg Oral BID  . insulin aspart  0-15 Units Subcutaneous Q4H  .  insulin aspart  12 Units Subcutaneous TID WC  . insulin glargine  30 Units Subcutaneous QHS  . lidocaine  1 patch Transdermal Q24H  . multivitamin with minerals  1 tablet Oral Daily  . nystatin   Topical BID  . pregabalin  150 mg Oral TID  . senna-docusate  1 tablet Oral QHS  . umeclidinium bromide  1 puff Inhalation Daily   Continuous Infusions:    LOS: 15 days     Briant Cedar, MD [facility]  If 7PM-7AM, please contact night-coverage 05/22/2020, 1:09 PM

## 2020-05-22 NOTE — Consult Note (Signed)
NAME:  Annette [name], MRN:  [id number], DOB:  31-Jul-1960, LOS: 15 ADMISSION DATE:  05/07/2020, CONSULTATION DATE: 05/21/2020 REFERRING MD:  Dr Cornelius Moras, CHIEF COMPLAINT: Subcuticular air  Brief History   59 year old female with a history of Covid pneumonia since resolved that is still dealing with the complications.  She has had recurrent pneumothorax.  She developed a pneumothorax on the right side today and required chest tube placement.  History of present illness   59 y/o female that [facility] from an outside facility.  She was originally diagnosed with COVID-19 approximately 2 months ago.  This was complicated by a pulmonary embolism which she had been on Eliquis initially and then on heparin infusion.  She also developed a right-sided pneumothorax and on 05/06/2020 had a chest tube placed.  She is currently postoperative day 10 from right-sided VATS procedure with mechanical pleurodesis.  At the time of the VATS she had a large right chest wall hematoma.  Chest tube was removed on 05/20/2020.  On the evening of 12 12/2019 patient developed a right-sided pneumothorax with a large amount of subcutaneous air.  Air track from the base of the right breast to the neck.  Dr. Noel Gerold from cardiothoracic surgery placed a right-sided chest tube.  Patient transferred to the intensive care unit to monitor patient's airway secondary to large amount of subcutaneous air.  Currently she denies any shortness of breath chest pain or chest pressure other than the discomfort at her surgical site.   Patient was originally seen and treatment was initiated at 2345 hrs. on 05/21/2020.  Documentation delayed due to other patient care. Past Medical History  COVID-19 pneumonia MRSA pneumonia COPD Hypertension Hyperlipidemia Subsegmental pulmonary embolism Right pneumohemothorax  Significant Hospital Events   Development of right pneumothorax   Procedures:  VATS procedure 05/06/2020 at outside facility 05/20/2020  chest tube removed 05/21/2020 right-sided chest tube placed   Micro Data:  COVID-19 negative currently  Antimicrobials:  None   Objective   Blood pressure 112/62, pulse 92, temperature 97.7 F (36.5 C), temperature source Oral, resp. rate 18, height 5\' 8"  (1.727 m), weight 100.1 kg, SpO2 96 %.        Intake/Output Summary (Last 24 hours) at 05/22/2020 0127 Last data filed at 05/22/2020 0028 Gross per 24 hour  Intake 586.01 ml  Output 1758 ml  Net -1171.99 ml   Filed Weights   05/20/20 0300 05/21/20 0427 05/22/20 0042  Weight: 106.2 kg 100.2 kg 100.1 kg    Examination: General: No acute distress HENT: Atraumatic/normocephalic mucous membranes are moist there is mild amount of subcuticular air tracking from the chest up to the right greater than left neck.  No difficulty swallowing.  Subcu air does not appear to be impeding function of breathing. Lungs: Mild rhonchi at the bases bilaterally.  No wheezing or rales Chest: Extensive subcuticular air over the anterior and lateral aspects of the chest bilaterally. Cardiovascular: Regular rate no appreciable murmur rub or gallop. Abdomen: Soft, nontender, nondistended, no rebound/rigidity/guarding.  No significant subcuticular air felt. Extremities: Distal pulse intact x4.  No significant edema.  No cyanosis. Neuro: Awake alert and appropriate.  Motor is grossly intact x4 extremities. GU: Pure wick in place  Resolved Hospital Problem list     Assessment & Plan:  Bilateral pneumothorax with right chest tube placement. Pulmonary embolism Status post COVID-19 pneumonia MRSA pneumonia COPD Diabetes mellitus type 2   Plan: Discussed the case at length with Dr. 14/08/21 from cardiothoracic surgery.  He placed a  right-sided chest tube.  We both agree that there is no immediate indication for a left-sided chest tube due to its small size.  We will continue to closely monitor.  Repeat chest x-ray early this a.m. Patient continues to  have significant subcuticular air tracking across both breasts and up into the neck.  Currently she denies any shortness of breath and therefore we will continue monitoring clinically.  Does not appear to require gills procedure at this time. The patient would want to be intubated if it was related to a potentially recoverable event.  She stated that she wanted to be a DNR at the time of discharge from the hospital. Currently off antibiotics Monitor I's/O's. Patient was on heparin infusion but this is being discontinued secondary to the hemothorax it was seen at the time of chest tube placement by cardiothoracic team.  Best practice (evaluated daily)   Diet: N.p.o. at this moment but as her condition stabilizes would resume p.o. intake. Pain/Anxiety/Delirium protocol (if indicated): Tramadol and morphine VAP protocol (if indicated): N/A DVT prophylaxis: SCDs only GI prophylaxis: Protonix Glucose control: Insulin sliding scale Mobility: Bedrest  Code Status: Partial DNR Disposition: Transferred to the intensive care unit for further work-up.  Labs   CBC: Recent Labs  Lab 05/17/20 0110 05/17/20 0110 05/17/20 1113 05/18/20 0236 05/19/20 0159 05/20/20 0246 05/21/20 0045  WBC 4.6  --   --  3.8* 4.2 3.8* 3.9*  NEUTROABS 2.9  --   --  3.1 2.4 2.1 2.4  HGB 7.6*   < > 8.2* 8.6* 8.1* 8.0* 8.9*  HCT 25.2*   < > 26.9* 27.2* 26.5* 26.7* 28.0*  MCV 93.7  --   --  91.9 94.6 96.0 92.7  PLT 77*  --   --  77* 78* 97* 100*   < > = values in this interval not displayed.    Basic Metabolic Panel: Recent Labs  Lab 05/17/20 0110 05/17/20 0110 05/18/20 0236 05/18/20 0236 05/18/20 0455 05/19/20 0159 05/20/20 0246 05/21/20 0045 05/21/20 1858  NA 138  --  138  --   --  135 138 137  --   K 3.8  --  4.7  --   --  4.6 4.7 3.9  --   CL 93*  --  88*  --   --  86* 89* 90*  --   CO2 37*  --  39*  --   --  42* 41* 37*  --   GLUCOSE 207*   < > 380*   < > 396* 302* 185* 198* 422*  BUN 12  --  11   --   --  15 15 15   --   CREATININE 0.53  --  0.63  --   --  0.64 0.77 0.67  --   CALCIUM 7.9*  --  8.7*  --   --  8.3* 8.4* 8.8*  --   MG 1.6*  --  1.9  --   --  1.7 1.9 1.9  --   PHOS 1.9*  --  4.2  --   --  4.2 4.2 3.9  --    < > = values in this interval not displayed.   GFR: Estimated Creatinine Clearance: 92.6 mL/min (by C-G formula based on SCr of 0.67 mg/dL). Recent Labs  Lab 05/18/20 0236 05/19/20 0159 05/20/20 0246 05/21/20 0045  WBC 3.8* 4.2 3.8* 3.9*    Liver Function Tests: Recent Labs  Lab 05/17/20 0110 05/18/20 0236 05/19/20 0159 05/20/20 0246 05/21/20  0045  AST 11* 19 19 17  13*  ALT 12 14 15 15 14   ALKPHOS 47 60 58 58 60  BILITOT 0.5 0.6 0.8 0.8 0.8  PROT 4.3* 5.0* 4.7* 4.7* 5.2*  ALBUMIN 1.5* 1.7* 1.7* 1.8* 1.9*   No results for input(s): LIPASE, AMYLASE in the last 168 hours. No results for input(s): AMMONIA in the last 168 hours.  ABG    Component Value Date/Time   PHART 7.400 05/12/2020 1524   PCO2ART 58.4 (H) 05/12/2020 1524   PO2ART 166 (H) 05/12/2020 1524   HCO3 36.1 (H) 05/12/2020 1524   TCO2 38 (H) 05/12/2020 1524   O2SAT 99.0 05/12/2020 1524     Coagulation Profile: No results for input(s): INR, PROTIME in the last 168 hours.  Cardiac Enzymes: No results for input(s): CKTOTAL, CKMB, CKMBINDEX, TROPONINI in the last 168 hours.  HbA1C: Hgb A1c MFr Bld  Date/Time Value Ref Range Status  05/08/2020 12:16 AM 10.7 (H) 4.8 - 5.6 % Final    Comment:    (NOTE) Pre diabetes:          5.7%-6.4%  Diabetes:              >6.4%  Glycemic control for   <7.0% adults with diabetes     CBG: Recent Labs  Lab 05/21/20 0424 05/21/20 0747 05/21/20 1237 05/21/20 1815 05/21/20 1950  GLUCAP 183* 163* 199* 418* 400*    Review of Systems:   10 point review of systems was completed.  All were negative with the following exceptions:  Bilateral breast tenderness secondary to subcuticular air Chest enlargement secondary to subcuticular  air Tenderness around chest tube insertion site. Mild change in voice secondary to subcuticular air No shortness of breath or dyspnea on exertion while in bed.  Past Medical History  She,  has a past medical history of Anxiety, Depression, Diabetes mellitus without complication (HCC), Hypertension, and Pneumonia.   Surgical History    Past Surgical History:  Procedure Laterality Date  . ABDOMINAL HYSTERECTOMY    . CARPAL TUNNEL RELEASE     Right  . HEMATOMA EVACUATION Right 05/12/2020   Procedure: EVACUATION HEMATOMA;  Surgeon: 14/07/21, MD;  Location: [facility] OR;  Service: Thoracic;  Laterality: Right;  05/14/2020 VIDEO ASSISTED THORACOSCOPY (VATS)/THOROCOTOMY Right 05/12/2020   Procedure: VIDEO ASSISTED THORACOSCOPY (VATS)/THOROCOTOMY MECCHANICAL PLEURO DESIS;  Surgeon: Marland Kitchen, MD;  Location: [facility] OR;  Service: Thoracic;  Laterality: Right;  . WOUND EXPLORATION Right 05/17/2020   Procedure: RIGHT CHEST WALL WOUND CLOSURE REMOVAL OF WOUND VAC;  Surgeon: [name], MD;  Location: [facility] OR;  Service: Thoracic;  Laterality: Right;     Social History   reports that she has quit smoking. Her smoking use included cigarettes. She has never used smokeless tobacco. She reports previous alcohol use. She reports that she does not use drugs.   Family History   Her family history is not on file.   Allergies Allergies  Allergen Reactions  . Cymbalta [Duloxetine Hcl]     Psychosis "self-harm"     Home Medications  Prior to Admission medications   Medication Sig Start Date End Date Taking? Authorizing Provider  atorvastatin (LIPITOR) 40 MG tablet Take 40 mg by mouth daily. 03/14/20  Yes [provider]  cholecalciferol (VITAMIN D3) 25 MCG (1000 UNIT) tablet Take 1,000 Units by mouth daily.   Yes [provider]  fenofibrate 160 MG tablet Take 160 mg by mouth daily. 02/15/20  Yes [provider]  glipiZIDE (GLUCOTROL) 10 MG tablet Take 10 mg by mouth 2 (two)  times daily. 12/20/19  Yes [provider]  montelukast (SINGULAIR) 10 MG tablet Take 10 mg by mouth daily. 03/14/20  Yes [provider]  omeprazole (PRILOSEC) 40 MG capsule Take 40 mg by mouth 2 (two) times daily. 02/18/20  Yes [provider]  apixaban (ELIQUIS) 5 MG TABS tablet Take 2 tablets (10mg ) twice daily for 7 days, then 1 tablet (5mg ) twice daily 05/21/20   , MD  clonazePAM (KLONOPIN) 0.5 MG tablet Take 1 tablet (0.5 mg total) by mouth 2 (two) times daily. 05/21/20   Rhetta Mura, MD  furosemide (LASIX) 40 MG tablet Take 1 tablet (40 mg total) by mouth daily. 05/21/20 05/21/21  14/7/21, MD  insulin glargine (LANTUS) 100 UNIT/ML injection Inject 0.15 mLs (15 Units total) into the skin at bedtime. 05/21/20   Rhetta Mura, MD  nystatin (MYCOSTATIN/NYSTOP) powder Apply topically 2 (two) times daily. 05/21/20   Rhetta Mura, MD  oxyCODONE (OXY IR/ROXICODONE) 5 MG immediate release tablet Take 1-2 tablets (5-10 mg total) by mouth every 4 (four) hours as needed for moderate pain. 05/21/20   Rhetta Mura, MD  polyethylene glycol (MIRALAX / GLYCOLAX) 17 g packet Take 17 g by mouth 2 (two) times daily as needed for mild constipation. 05/21/20   Rhetta Mura, MD  pregabalin (LYRICA) 150 MG capsule Take 1 capsule (150 mg total) by mouth in the morning, at noon, and at bedtime. 05/21/20   Rhetta Mura, MD  senna-docusate (SENOKOT-S) 8.6-50 MG tablet Take 1 tablet by mouth at bedtime. 05/21/20   09-21-1978, MD  umeclidinium bromide (INCRUSE ELLIPTA) 62.5 MCG/INH AEPB Inhale 1 puff into the lungs daily. 05/22/20   Rhetta Mura, MD     Critical care time: 14/8/21

## 2020-05-22 NOTE — Progress Notes (Signed)
Patient ID: Annette [name], female   DOB: 02-28-1961, 59 y.o.   MRN: [id number] EVENING ROUNDS NOTE :     [address]       [place] [id number]             [phone]                 5 Days Post-Op Procedure(s) (LRB): RIGHT CHEST WALL WOUND CLOSURE REMOVAL OF WOUND VAC (Right)  Total Length of Stay:  LOS: 15 days  BP 121/78   Pulse 93   Temp 99.2 F (37.3 C) (Oral)   Resp 17   Ht 5\' 8"  (1.727 m)   Wt 100.1 kg   SpO2 100%   BMI 33.55 kg/m   .Intake/Output      12/07 0701 - 12/08 0700 12/08 0701 - 12/09 0700   P.O. 480 190   I.V. (mL/kg)     Total Intake(mL/kg) 480 (4.8) 190 (1.9)   Urine (mL/kg/hr) 900 (0.4)    Stool     Chest Tube 68 40   Total Output 968 40   Net -488 +150        Urine Occurrence 1 x         Lab Results  Component Value Date   WBC 5.2 05/22/2020   HGB 9.3 (L) 05/22/2020   HCT 29.9 (L) 05/22/2020   PLT 144 (L) 05/22/2020   GLUCOSE 422 (H) 05/21/2020   ALT 14 05/21/2020   AST 13 (L) 05/21/2020   NA 137 05/21/2020   K 3.9 05/21/2020   CL 90 (L) 05/21/2020   CREATININE 0.67 05/21/2020   BUN 15 05/21/2020   CO2 37 (H) 05/21/2020   INR 1.0 05/11/2020   HGBA1C 10.7 (H) 05/08/2020   Going for chest ct     05/10/2020 MD  Beeper [phone] Office [phone] 05/22/2020 5:38 PM

## 2020-05-22 NOTE — Progress Notes (Signed)
Nutrition Follow-up  DOCUMENTATION CODES:   Obesity unspecified  INTERVENTION:    Snacks BID  Double protein with meals   MVI with minerals daily  NUTRITION DIAGNOSIS:   Increased nutrient needs related to wound healing, acute illness (recent COVID PNA) as evidenced by estimated needs.  Ongoing  GOAL:   Patient will meet greater than or equal to 90% of their needs   Progressing  MONITOR:   PO intake, Supplement acceptance, Skin  REASON FOR ASSESSMENT:   Malnutrition Screening Tool    ASSESSMENT:   59 yo female admitted from [facility] for treatment of a complicated pneumothorax in the setting of MRSA PNA. PMH includes COVID PNA 2 months PTA, recent PE, COPD, HTN, DM, asthma.   11/28- VATS, thoracotomy R pleurodesis, evacuation hematoma 12/08- new R PTX, chest tube placed   Developed R apical pneumothorax with large amount of subcutaneous air from base of right breast to neck. Chest tube placed.   Remains NPO until CT resulted. Prior to transfer to ICU pt was consuming 100% of each meals. Does not like Magic Cup or any other milky supplement. Does not like [employer]. Willing to try snacks once diet is advanced.   Admission weight: 117.9 kg  Current weight: 100.1 kg   I/O: 900 ml x 24 hrs  Chest tube (R): 68 ml since placement  Medications: dulcolax, 40 mg lasix BID, SS novolog, lantus, MVI with minerals, senokot Labs: CBG [phone]  Diet Order:   Diet Order            Diet NPO time specified  Diet effective now           Diet - low sodium heart healthy                 EDUCATION NEEDS:   Not appropriate for education at this time  Skin:  Skin Assessment: Skin Integrity Issues: Skin Integrity Issues:: Stage II, Incisions Stage II: coccyx Incisions: R chest x3  Last BM:  12/7  Height:   Ht Readings from Last 1 Encounters:  05/17/20 5\' 8"  (1.727 m)    Weight:   Wt Readings from Last 1 Encounters:  05/22/20 100.1 kg     Ideal Body Weight:  63.6 kg  BMI:  Body mass index is 33.55 kg/m.  Estimated Nutritional Needs:   Kcal:  2300-2500  Protein:  125-150 gm  Fluid:  >/= 2 L  14/08/21 RD, LDN Clinical Nutrition Pager listed in [facility]

## 2020-05-22 NOTE — Progress Notes (Signed)
eLink Physician-Brief Progress Note Patient Name: Annette [name] DOB: Apr 17, 1961 MRN: [id number]   Date of Service  05/22/2020  HPI/Events of Note  Patient is having pain at her chest tube site, she has received Oxycodone and Ultram orally but needs a small iv does of pain medicine until the oral medications fully kick in.  eICU Interventions  Dilaudid 0.5 mg iv x 1 ordered.        Thomasene Lot Sahmir Weatherbee 05/22/2020, 3:12 AM

## 2020-05-23 ENCOUNTER — Inpatient Hospital Stay (HOSPITAL_COMMUNITY): Payer: Medicare Other

## 2020-05-23 ENCOUNTER — Encounter (HOSPITAL_COMMUNITY): Payer: Self-pay | Admitting: Internal Medicine

## 2020-05-23 LAB — BASIC METABOLIC PANEL
Anion gap: 13 (ref 5–15)
BUN: 26 mg/dL — ABNORMAL HIGH (ref 6–20)
CO2: 35 mmol/L — ABNORMAL HIGH (ref 22–32)
Calcium: 8.9 mg/dL (ref 8.9–10.3)
Chloride: 88 mmol/L — ABNORMAL LOW (ref 98–111)
Creatinine, Ser: 0.89 mg/dL (ref 0.44–1.00)
GFR, Estimated: >60 mL/min (ref >60)
Glucose, Bld: 120 mg/dL — ABNORMAL HIGH (ref 70–99)
Potassium: 4.3 mmol/L (ref 3.5–5.1)
Sodium: 136 mmol/L (ref 135–145)

## 2020-05-23 LAB — CBC WITH DIFFERENTIAL/PLATELET
Abs Immature Granulocytes: 0.03 10*3/uL (ref 0.00–0.07)
Basophils Absolute: 0 10*3/uL (ref 0.0–0.1)
Basophils Relative: 1 %
Eosinophils Absolute: 0.3 10*3/uL (ref 0.0–0.5)
Eosinophils Relative: 5 %
HCT: 31.2 % — ABNORMAL LOW (ref 36.0–46.0)
Hemoglobin: 9.7 g/dL — ABNORMAL LOW (ref 12.0–15.0)
Immature Granulocytes: 1 %
Lymphocytes Relative: 27 %
Lymphs Abs: 1.4 10*3/uL (ref 0.7–4.0)
MCH: 29 pg (ref 26.0–34.0)
MCHC: 31.1 g/dL (ref 30.0–36.0)
MCV: 93.4 fL (ref 80.0–100.0)
Monocytes Absolute: 0.5 10*3/uL (ref 0.1–1.0)
Monocytes Relative: 10 %
Neutro Abs: 2.9 10*3/uL (ref 1.7–7.7)
Neutrophils Relative %: 56 %
Platelets: 158 10*3/uL (ref 150–400)
RBC: 3.34 MIL/uL — ABNORMAL LOW (ref 3.87–5.11)
RDW: 20.2 % — ABNORMAL HIGH (ref 11.5–15.5)
WBC: 5.1 10*3/uL (ref 4.0–10.5)
nRBC: 0 % (ref 0.0–0.2)

## 2020-05-23 LAB — MAGNESIUM: Magnesium: 1.9 mg/dL (ref 1.7–2.4)

## 2020-05-23 LAB — GLUCOSE, CAPILLARY
Glucose-Capillary: 107 mg/dL — ABNORMAL HIGH (ref 70–99)
Glucose-Capillary: 125 mg/dL — ABNORMAL HIGH (ref 70–99)
Glucose-Capillary: 124 mg/dL — ABNORMAL HIGH (ref 70–99)
Glucose-Capillary: 105 mg/dL — ABNORMAL HIGH (ref 70–99)
Glucose-Capillary: 263 mg/dL — ABNORMAL HIGH (ref 70–99)

## 2020-05-23 LAB — PHOSPHORUS: Phosphorus: 5.6 mg/dL — ABNORMAL HIGH (ref 2.5–4.6)

## 2020-05-23 MED ORDER — MIDAZOLAM HCL 2 MG/2ML IJ SOLN
INTRAMUSCULAR | Status: AC
  Filled 2020-05-23: qty 4

## 2020-05-23 MED ORDER — FENTANYL CITRATE (PF) 100 MCG/2ML IJ SOLN
INTRAMUSCULAR | Status: AC
  Filled 2020-05-23: qty 4

## 2020-05-23 MED ORDER — FENTANYL CITRATE (PF) 100 MCG/2ML IJ SOLN
INTRAMUSCULAR | Status: AC | PRN
Start: 2020-05-23 — End: 2020-05-23
  Administered 2020-05-23 (×2): 25 ug via INTRAVENOUS

## 2020-05-23 MED ORDER — HYDROMORPHONE HCL 1 MG/ML IJ SOLN
0.5000 mg | INTRAMUSCULAR | Status: DC | PRN
  Administered 2020-05-23 – 2020-05-24 (×3): 0.5 mg via INTRAVENOUS
  Filled 2020-05-23: qty 0.5
  Filled 2020-05-23: qty 1
  Filled 2020-05-23: qty 0.5
  Filled 2020-05-23 (×2): qty 1

## 2020-05-23 MED ORDER — MIDAZOLAM HCL 2 MG/2ML IJ SOLN
INTRAMUSCULAR | Status: AC | PRN
  Administered 2020-05-23: 0.5 mg via INTRAVENOUS

## 2020-05-23 MED ORDER — SODIUM CHLORIDE 0.9% FLUSH: 10.0000 mL | INTRAVENOUS | Status: DC | PRN

## 2020-05-23 MED ORDER — LIDOCAINE HCL 1 % IJ SOLN
INTRAMUSCULAR | Status: AC
  Filled 2020-05-23: qty 20

## 2020-05-23 MED ORDER — SODIUM CHLORIDE 0.9% FLUSH
10.0000 mL | Freq: Two times a day (BID) | INTRAVENOUS | Status: DC
  Administered 2020-05-23 (×2): 10 mL
  Administered 2020-05-24: 20 mL
  Administered 2020-05-24 – 2020-05-29 (×10): 10 mL
  Administered 2020-05-29: 20 mL
  Administered 2020-05-30 – 2020-06-10 (×22): 10 mL

## 2020-05-23 NOTE — Progress Notes (Signed)
Attempted to turn pt at the beginning of the shift.  She adamantly refused.  Pt was educated on the need for turns, especially with her sacral wound.  She maintained her refusal stating "it won't kill me".  The importance of bed mobility was reinforced.  She finally agreed to allow for the bed mobility, but still refused manual turns.

## 2020-05-23 NOTE — Consult Note (Signed)
Chief Complaint: Patient was seen in consultation today for subcutaneous emphysema  Referring Physician(s): Dr. Myrla Halsted  Supervising Physician: Malachy Moan  Patient Status: [facility] - In-pt  History of Present Illness: Annette [name] is a 59 y.o. female with past medical history of COVID-19 complicated by MRSA pneumonia, pulmonary embolus, and hemopneumothorax.  She presented to [facility] as a transfer after chest tube placement for ongoing management of her critical cardiopulmonary issues. She is familiar to IR service from chest tube placement 11/24, however ultimately required VATS/mechanical pleurodesis with evacuation of a large chest wall hematoma due to hemorrhage associated with her original large bore tube.  She was recovering well with minimal chest tube output and no air leak for several days with plans for possible discharge 12/7. Her chest tubes were removed in anticipation of this on 12/6, however she developed extensive subcutaneous air to the chest, neck, and face.  2 large bore chest tubes have been replaced. CT Chest performed this AM showed ongoing right anterior pneumothorax not adequately drained.  IR consulted for replacement of chest tube.   Case reviewed and approved by Dr. Archer Asa.  Patient assessed at bedside this AM.  She is resting comfortably, but is resistant to any type of movement or turning.  Recounts a painful replacement of chest tubes yesterday and is tearing in thinking of having another chest tube placed today.   She has been NPO today.  Agreeable to proceed with sedation.     Past Medical History:  Diagnosis Date  . Anxiety   . Depression   . Diabetes mellitus without complication (HCC)    Type II  . Hypertension   . Pneumonia     Past Surgical History:  Procedure Laterality Date  . ABDOMINAL HYSTERECTOMY    . CARPAL TUNNEL RELEASE     Right  . HEMATOMA EVACUATION Right 05/12/2020   Procedure: EVACUATION HEMATOMA;  Surgeon: [name], MD;  Location: [facility] OR;  Service: Thoracic;  Laterality: Right;  Marland Kitchen VIDEO ASSISTED THORACOSCOPY (VATS)/THOROCOTOMY Right 05/12/2020   Procedure: VIDEO ASSISTED THORACOSCOPY (VATS)/THOROCOTOMY MECCHANICAL PLEURO DESIS;  Surgeon: [name], MD;  Location: [facility] OR;  Service: Thoracic;  Laterality: Right;  . WOUND EXPLORATION Right 05/17/2020   Procedure: RIGHT CHEST WALL WOUND CLOSURE REMOVAL OF WOUND VAC;  Surgeon: [name], MD;  Location: [facility] OR;  Service: Thoracic;  Laterality: Right;    Allergies: Cymbalta [duloxetine hcl]  Medications: Prior to Admission medications   Medication Sig Start Date End Date Taking? Authorizing Provider  atorvastatin (LIPITOR) 40 MG tablet Take 40 mg by mouth daily. 03/14/20  Yes [provider]  cholecalciferol (VITAMIN D3) 25 MCG (1000 UNIT) tablet Take 1,000 Units by mouth daily.   Yes [provider]  fenofibrate 160 MG tablet Take 160 mg by mouth daily. 02/15/20  Yes [provider]  glipiZIDE (GLUCOTROL) 10 MG tablet Take 10 mg by mouth 2 (two) times daily. 12/20/19  Yes [provider]  montelukast (SINGULAIR) 10 MG tablet Take 10 mg by mouth daily. 03/14/20  Yes [provider]  omeprazole (PRILOSEC) 40 MG capsule Take 40 mg by mouth 2 (two) times daily. 02/18/20  Yes [provider]  apixaban (ELIQUIS) 5 MG TABS tablet Take 2 tablets (10mg ) twice daily for 7 days, then 1 tablet (5mg ) twice daily 05/21/20   , MD  clonazePAM (KLONOPIN) 0.5 MG tablet Take 1 tablet (0.5 mg total) by mouth 2 (two) times daily. 05/21/20   Samtani,  Jai-Gurmukh, MD  furosemide (LASIX) 40 MG tablet Take 1 tablet (40 mg total) by mouth daily. 05/21/20 05/21/21  Rhetta MuraSamtani, Jai-Gurmukh, MD  insulin glargine (LANTUS) 100 UNIT/ML injection Inject 0.15 mLs (15 Units total) into the skin at bedtime. 05/21/20   Rhetta MuraSamtani, Jai-Gurmukh, MD  nystatin (MYCOSTATIN/NYSTOP) powder Apply topically 2 (two) times daily.  05/21/20   Rhetta MuraSamtani, Jai-Gurmukh, MD  oxyCODONE (OXY IR/ROXICODONE) 5 MG immediate release tablet Take 1-2 tablets (5-10 mg total) by mouth every 4 (four) hours as needed for moderate pain. 05/21/20   Rhetta MuraSamtani, Jai-Gurmukh, MD  polyethylene glycol (MIRALAX / GLYCOLAX) 17 g packet Take 17 g by mouth 2 (two) times daily as needed for mild constipation. 05/21/20   Rhetta MuraSamtani, Jai-Gurmukh, MD  pregabalin (LYRICA) 150 MG capsule Take 1 capsule (150 mg total) by mouth in the morning, at noon, and at bedtime. 05/21/20   Rhetta MuraSamtani, Jai-Gurmukh, MD  senna-docusate (SENOKOT-S) 8.6-50 MG tablet Take 1 tablet by mouth at bedtime. 05/21/20   Rhetta MuraSamtani, Jai-Gurmukh, MD  umeclidinium bromide (INCRUSE ELLIPTA) 62.5 MCG/INH AEPB Inhale 1 puff into the lungs daily. 05/22/20   Rhetta MuraSamtani, Jai-Gurmukh, MD     History reviewed. No pertinent family history.  Social History   Socioeconomic History  . Marital status: Single    Spouse name: Not on file  . Number of children: Not on file  . Years of education: Not on file  . Highest education level: Not on file  Occupational History  . Not on file  Tobacco Use  . Smoking status: Former Smoker    Types: Cigarettes  . Smokeless tobacco: Never Used  Vaping Use  . Vaping Use: Never used  Substance and Sexual Activity  . Alcohol use: Not Currently  . Drug use: Never  . Sexual activity: Not on file  Other Topics Concern  . Not on file  Social History Narrative  . Not on file   Social Determinants of Health   Financial Resource Strain: Not on file  Food Insecurity: Not on file  Transportation Needs: Not on file  Physical Activity: Not on file  Stress: Not on file  Social Connections: Not on file     Review of Systems: A 12 point ROS discussed and pertinent positives are indicated in the HPI above.  All other systems are negative.  Review of Systems  Constitutional: Negative for fatigue and fever.  Respiratory: Positive for cough and shortness of breath.    Cardiovascular: Negative for chest pain.  Gastrointestinal: Negative for abdominal pain, diarrhea, nausea and vomiting.  Genitourinary: Negative for dysuria.  Musculoskeletal: Negative for back pain.  Psychiatric/Behavioral: Negative for behavioral problems and confusion.    Vital Signs: BP 102/65   Pulse 93   Temp 98.7 F (37.1 C)   Resp 18   Ht 5\' 8"  (1.727 m)   Wt 218 lb 7.6 oz (99.1 kg)   SpO2 96%   BMI 33.22 kg/m   Physical Exam Vitals and nursing note reviewed.  Constitutional:      General: She is not in acute distress.    Appearance: Normal appearance. She is ill-appearing.     Comments: Generalized swelling/puffing due to significant subcutaneous emphysema to chest, neck, and face. Right eye swollen shut  Cardiovascular:     Rate and Rhythm: Normal rate and regular rhythm.  Pulmonary:     Effort: Pulmonary effort is normal.     Comments: Faint breath sounds due to severe subcutaneous air 2 large bore chest tubes in place with minimal output  and no air leak.  Abdominal:     General: Abdomen is flat.     Palpations: Abdomen is soft.  Skin:    General: Skin is warm and dry.  Neurological:     General: No focal deficit present.     Mental Status: She is alert and oriented to person, place, and time. Mental status is at baseline.  Psychiatric:        Mood and Affect: Mood normal.        Behavior: Behavior normal.        Thought Content: Thought content normal.        Judgment: Judgment normal.      MD Evaluation Airway: WNL Heart: WNL Abdomen: WNL Chest/ Lungs: WNL ASA  Classification: 3 Mallampati/Airway Score: Two   Imaging: DG Chest 1 View  Result Date: 05/22/2020 CLINICAL DATA:  Pneumothorax on right. Additional history provided: VATS/thoracotomy 05/12/2020. EXAM: CHEST  1 VIEW COMPARISON:  Chest radiograph performed earlier today at 5:19 a.m. FINDINGS: Apparent slight interval retraction of the more superiorly located right-sided chest tube with  the side port just external to the chest wall. Interval placement of an additional chest tube projecting in the region of the right lung base. Interval progression of opacity at the right lung base which may reflect atelectasis. Redemonstrated extensive airspace disease throughout the remainder of the right lung. No appreciable right pneumothorax. Unchanged small left pneumothorax. Heart size within normal limits. Redemonstrated extensive subcutaneous emphysema. Impression 1 below will be called to the ordering clinician or representative by the Radiologist Assistant, and communication documented in the PACS or [employer]. IMPRESSION: Slight interval retraction of the more superiorly located right chest tube, now with the side port positioned just external to the chest wall. Interval placement of a second chest tube projecting in the region of the right lung base. No appreciable right pneumothorax Interval progression of opacity at the right lung base, which may reflect atelectasis. Redemonstrated extensive airspace disease throughout the remainder of the right lung. Unchanged small left pneumothorax. Redemonstrated extensive subcutaneous emphysema. Electronically Signed   By: Jackey Loge DO   On: 05/22/2020 12:21   DG Chest 1 View  Result Date: 05/16/2020 CLINICAL DATA:  Shortness of breath EXAM: CHEST  1 VIEW COMPARISON:  May 15, 2020 FINDINGS: Chest tubes on the right are unchanged in position. No pneumothorax. Airspace consolidation throughout much of the right lung, most severe in the right upper lobe region, is stable there is apparent atelectatic change in the left lower lobe region. No new opacity evident. Heart size and pulmonary vascularity are within normal limits and stable. No adenopathy. No bone lesions IMPRESSION: Chest tubes unchanged in position on the right without pneumothorax. Extensive consolidation remains on the right with atelectatic change in the left lower lung region. No new  opacity. Stable cardiac silhouette. Electronically Signed   By: Bretta Bang III M.D.   On: 05/16/2020 07:55   DG Chest 2 View  Result Date: 05/21/2020 CLINICAL DATA:  Chest pain, shortness of breath. EXAM: CHEST - 2 VIEW COMPARISON:  May 20, 2020. FINDINGS: The heart size and mediastinal contours are within normal limits. No pneumothorax is noted. Stable right lung opacity is noted, most prominently the right upper lobe, most consistent with pneumonia. Small right pleural effusion may be present. Stable mild left basilar atelectasis or infiltrate is noted. The visualized skeletal structures are unremarkable. IMPRESSION: Stable right upper lobe pneumonia. Small right pleural effusion may be present. Stable mild left basilar  atelectasis or infiltrate is noted. Electronically Signed   By: Lupita Raider M.D.   On: 05/21/2020 08:02   CT CHEST WO CONTRAST  Result Date: 05/22/2020 CLINICAL DATA:  Pneumothorax status post fat EXAM: CT CHEST WITHOUT CONTRAST TECHNIQUE: Multidetector CT imaging of the chest was performed following the standard protocol without IV contrast. COMPARISON:  CT dated May 10, 2020 FINDINGS: Cardiovascular: The heart size is stable. Minimal aortic calcifications are noted. There is no significant pericardial effusion. Mediastinum/Nodes: -- No mediastinal lymphadenopathy. -- No hilar lymphadenopathy. -- No axillary lymphadenopathy. -- No supraclavicular lymphadenopathy. -- Normal thyroid gland where visualized. -  Unremarkable esophagus. Lungs/Pleura: There is a moderate-sized right-sided hydropneumothorax. The air component has decreased from the prior study. There is a small left-sided pneumothorax. Extensive pneumomediastinum is noted. There is extensive consolidation involving the right upper lobe. There is moderate consolidation involving the right middle lobe. There is complete obstruction of the right lower lobe bronchus with extensive consolidation involving the right  lower lobe. There is a small complex right-sided pleural effusion containing blood products. The superior most chest tube is barely within the thoracic cavity. The side hole is within the subcutaneous soft tissues of the right flank. Surrounding the superior right test tube is a right-sided hematoma which has decreased in size from the prior study. There is a new inferiorly oriented right-sided chest tube that is well positioned within the thoracic cavity. Extensive subcutaneous gas is noted. This has substantially worsened since the prior CT in November. There is a new defect in the chest wall between the fourth and fifth ribs measuring approximately 4.3 cm (axial series 3, image 77). There is a collection of air extending into the right breast tissue arising from this defect. Upper Abdomen: No acute abnormality is noted in the upper abdomen. Again noted is a simple appearing hepatic cyst. Musculoskeletal: No chest wall abnormality. No bony spinal canal stenosis. IMPRESSION: 1. As described on previous chest x-rays, the superior most right-sided chest tube is suboptimally positioned. The tube is barely within the thoracic cavity and the side hole is within the soft tissues of the right flank. This may be contributing to the patient's extensive subcutaneous emphysema. 2. New large defect in the thoracic cavity between the fourth and fifth ribs anterolaterally on the right. This too is likely contributing to the patient's extensive subcutaneous emphysema. 3. Moderate right-sided pneumothorax, improved from prior study. New small left-sided pneumothorax. Extensive pneumomediastinum. 4. Small right-sided hemothorax. 5. New well-positioned right-sided chest tube terminating in the lower right thoracic cavity. 6. There is extensive consolidation throughout the right lung with complete opacification of the right lower lobe bronchus. Coarse bilateral airspace opacities are noted. Aortic Atherosclerosis (ICD10-I70.0). These  results will be called to the ordering clinician or representative by the Radiologist Assistant, and communication documented in the PACS or [employer]. Electronically Signed   By: Katherine Mantle M.D.   On: 05/22/2020 19:18   CT CHEST WO CONTRAST  Result Date: 05/08/2020 CLINICAL DATA:  Shortness of breath and cough. EXAM: CT CHEST WITHOUT CONTRAST TECHNIQUE: Multidetector CT imaging of the chest was performed following the standard protocol without IV contrast. COMPARISON:  Chest radiograph 05/08/2020 FINDINGS: Cardiovascular: Normal heart size. No pericardial effusions. Normal caliber thoracic aorta with scattered calcification. Coronary artery calcification. Mediastinum/Nodes: Pneumomediastinum is present. Subcutaneous emphysema throughout the right chest wall and extending to the right side of the neck. No significant lymphadenopathy. Visualized lymph nodes are not pathologically enlarged. Esophagus is mostly decompressed. Lungs/Pleura: Moderately  large right pneumothorax. A right chest tube is in place. Atelectasis and consolidation in the right lung most prominent in the right upper lung. Air bronchograms and emphysematous changes are present. Small bilateral pleural effusions. Emphysematous changes and scattered fibrosis in the left lung. Upper Abdomen: Cyst in the liver. No acute process demonstrated in the upper abdomen. Musculoskeletal: Degenerative changes in the spine. No destructive bone lesions. IMPRESSION: 1. Moderately large right pneumothorax with right chest tube in place. Subcutaneous emphysema throughout the right chest wall and extending to the right side of the neck. Pneumomediastinum. 2. Atelectasis and consolidation in the right lung most prominent in the right upper lung. 3. Small bilateral pleural effusions. 4. Emphysematous changes and fibrosis in the left lung. 5. Emphysema and aortic atherosclerosis. Aortic Atherosclerosis (ICD10-I70.0) and Emphysema (ICD10-J43.9).  Electronically Signed   By: Burman Nieves M.D.   On: 05/08/2020 02:15   CT ANGIO CHEST PE W OR WO CONTRAST  Result Date: 05/11/2020 CLINICAL DATA:  Hemothorax.  Bleeding from chest tube. EXAM: CT ANGIOGRAPHY CHEST WITH CONTRAST TECHNIQUE: Multidetector CT imaging of the chest was performed using the standard protocol during bolus administration of intravenous contrast. Multiplanar CT image reconstructions and MIPs were obtained to evaluate the vascular anatomy. CONTRAST:  OMNIPAQUE IOHEXOL 350 MG/ML SOLN COMPARISON:  CT chest dated May 08, 2020 FINDINGS: Cardiovascular: Contrast injection is sufficient to demonstrate satisfactory opacification of the pulmonary arteries to the segmental level. There is an acute, nonocclusive pulmonary embolism involving the right lower lobe pulmonary artery. No other significant pulmonary emboli are identified on this study. The size of the main pulmonary artery is enlarged, measuring 3.2 cm. Heart size is normal, with no pericardial effusion. The course and caliber of the aorta are normal. There is no atherosclerotic calcification. Opacification decreased due to pulmonary arterial phase contrast bolus timing. Mediastinum/Nodes: --mild mediastinal adenopathy is noted. --mild hilar adenopathy is noted. -- No axillary lymphadenopathy. -- No supraclavicular lymphadenopathy. -- Normal thyroid gland where visualized. -  Unremarkable esophagus. Lungs/Pleura: 2 right-sided chest tubes are in place. There is a new anterior approach percutaneous chest tube in place. There is a persistent moderate-sized right-sided pneumothorax which has minimally decreased in size from prior study. There is persistent pneumomediastinum. The mediastinum is shifted to the left. The right upper lobe is nearly entirely replaced by consolidation. There is a new loculated right-sided hemothorax. The right-sided surgically placed chest tube is again noted. The chest tube is kinked both proximally  and distally. Chest tube also possibly courses through the right lower lobe causing a pulmonary laceration. There is a new right flank large hematoma measuring approximately 12 cm. Pockets of subcutaneous gas are again visualized along the patient's right flank. There is a trace left-sided pleural effusion. Debris is noted within the trachea and right lower lobe bronchus. Coarse airspace opacities and pulmonary fibrosis are again noted. Upper Abdomen: Contrast bolus timing is not optimized for evaluation of the abdominal organs. There is diffuse wall thickening of the gallbladder there is only partially visualized. A simple appearing cyst is noted in the liver. Bilateral adrenal nodules are again noted. Musculoskeletal: No chest wall abnormality. No bony spinal canal stenosis. Review of the MIP images confirms the above findings. IMPRESSION: 1. New, nonocclusive pulmonary embolism involving the right lower lobe pulmonary artery. No evidence for right-sided heart strain. 2. Persistent moderate-sized right-sided pneumothorax despite new chest tube placement. The mediastinum appears shifted to the left which has worsened since the prior study. This raises suspicion for developing  tension physiology. 3. New moderate-sized, right-sided hemothorax. 4. The right-sided surgically placed chest tube is kinked both proximally and distally. Chest tube also possibly courses through the right lower lobe causing a pulmonary laceration. 5. New large right flank hematoma measuring approximately 12 cm. No definite evidence for active arterial extravasation. 6. Diffuse wall thickening of the gallbladder which is only partially visualized. If there is clinical concern for acute cholecystitis, follow-up with ultrasound is recommended. 7. Coarse airspace opacities and pulmonary fibrosis are again noted. 8. Trace left-sided pleural effusion. These results were called by telephone at the time of interpretation on 05/11/2020 at 12:10 am to  provider Charge Nurse Osvaldo Human , who verbally acknowledged these results. Electronically Signed   By: Katherine Mantle M.D.   On: 05/11/2020 00:10   DG CHEST PORT 1 VIEW  Result Date: 05/23/2020 CLINICAL DATA:  Chest tube.  Shortness of breath. EXAM: PORTABLE CHEST 1 VIEW COMPARISON:  CT 05/22/2020.  Chest x-ray 05/22/2020. FINDINGS: Superior right chest tube side hole again noted in the soft tissues of the right chest wall in unchanged position. Inferior right chest tube in stable position. Persistent right upper lung consolidation. Persistent bilateral atelectatic changes and interstitial changes. Heart size stable. Extensive diffuse bilateral neck and chest wall subcutaneous emphysema again noted without interim change. Reference is made to prior CT report. Known bilateral pneumothoraces poorly visualized. IMPRESSION: 1. Superior right chest tube side hole again noted in the soft tissues of the right chest wall in unchanged position. Inferior right chest tube in stable position. Known bilateral pneumothoraces poorly visualized. Extensive diffuse bilateral neck and chest wall subcutaneous emphysema again noted without interim change. Reference is made to chest CT report of 05/22/2020. 2. Persistent right upper lung consolidation. Persistent bilateral atelectatic changes and interstitial changes. Electronically Signed   By: Maisie Fus  Register   On: 05/23/2020 07:19   DG Chest Port 1 View  Result Date: 05/22/2020 CLINICAL DATA:  Chest tube, status post pneumothorax. Shortness of breath. EXAM: PORTABLE CHEST 1 VIEW COMPARISON:  05/21/2020 FINDINGS: Right chest tube remains in stable position with the side port near the chest wall. Extensive subcutaneous emphysema again noted. Small left pneumothorax is stable. No visible right pneumothorax. Extensive airspace disease throughout the right lung, unchanged. Heart is normal size. IMPRESSION: Stable extensive subcutaneous emphysema with the right chest  tube remaining in stable position. The side port remains at/near the chest wall. Small left pneumothorax, stable. Stable right lung airspace disease. Electronically Signed   By: Charlett Nose M.D.   On: 05/22/2020 07:39   DG CHEST PORT 1 VIEW  Result Date: 05/21/2020 CLINICAL DATA:  Shortness of breath EXAM: PORTABLE CHEST 1 VIEW COMPARISON:  X-ray earlier in the same day FINDINGS: Extensive subcutaneous edema is again noted. This is substantially worsened since the prior study. Diffuse right mid lung field airspace consolidation is noted. The right-sided chest tube is poorly position with the side hole straddling the right chest wall. There is a small to moderate-sized left-sided pneumothorax. The heart size is stable. Aortic calcifications are noted. IMPRESSION: 1. Significant interval worsening of subcutaneous emphysema. 2. Suboptimal right-sided chest tube positioning with the main side hole likely straddling the chest wall. 3. Small to moderate-sized left-sided pneumothorax, new from prior study. A right-sided pneumothorax cannot be excluded secondary to extensive subcutaneous emphysema. 4. Persistent diffuse airspace opacities throughout the right lung field, relatively similar to prior study. These results will be called to the ordering clinician or representative by the Radiologist Assistant, and  communication documented in the PACS or [employer]. Electronically Signed   By: Katherine Mantle M.D.   On: 05/21/2020 22:47   DG Chest Port 1 View  Result Date: 05/21/2020 CLINICAL DATA:  Sudden onset right chest pain with swelling and short of breath EXAM: PORTABLE CHEST 1 VIEW COMPARISON:  05/21/2020, 05/20/2020, 05/19/2020 FINDINGS: Interval development of large amount of subcutaneous emphysema within the right chest wall and neck. Possible trace right apical pneumothorax though difficult to appreciate given overlying subcutaneous emphysema. Probable small amount of pneumomediastinum. Dense right  apical consolidation as before. Small right-sided pleural effusion. Increasing airspace opacities at the right greater than left lung base. Stable cardiomediastinal silhouette. IMPRESSION: 1. Interval development of large amount of subcutaneous emphysema within the right chest wall and neck. 2. Possible trace right apical pneumothorax though difficult to appreciate given overlying subcutaneous emphysema. Probable small amount of pneumomediastinum. 3. Persistent dense right apical lung consolidation with increasing airspace disease at the right greater than left lung bases. Probable small right effusion. Electronically Signed   By: Jasmine Pang M.D.   On: 05/21/2020 19:28   DG Chest Port 1 View  Result Date: 05/20/2020 CLINICAL DATA:  Status post chest tube removal. EXAM: PORTABLE CHEST 1 VIEW COMPARISON:  05/20/2020 at 0552 hours FINDINGS: The 3 right-sided chest tubes on the prior study have been removed. No pneumothorax is identified. Right lung airspace opacities including dense consolidation in the apex are unchanged. Mild opacities in the left mid and lower lung are also unchanged. There is likely a small right pleural effusion. IMPRESSION: 1. Interval removal of right chest tubes. No pneumothorax. 2. Unchanged bilateral airspace opacities including dense right apical consolidation. Electronically Signed   By: Sebastian Ache M.D.   On: 05/20/2020 16:22   DG CHEST PORT 1 VIEW  Result Date: 05/20/2020 CLINICAL DATA:  Pleural effusion, status post right thoracotomy EXAM: PORTABLE CHEST 1 VIEW COMPARISON:  05/19/2020 FINDINGS: No significant interval change in AP portable chest radiograph with 3 right-sided chest tubes and dense consolidation of the right lung, primarily at the apex. Probable small layering right pleural effusion, not significantly changed. Unchanged subtle heterogeneous airspace opacity of the left midlung and lung base. No new airspace opacity. The heart and mediastinum are unremarkable.  IMPRESSION: 1. No significant interval change in chest radiographs with 3 right-sided chest tubes and dense consolidation of the right lung, primarily at the apex. Probable small layering right pleural effusion, not significantly changed. 2. Unchanged subtle heterogeneous airspace opacity of the left midlung and lung base. No new airspace opacity. Electronically Signed   By: Lauralyn Primes M.D.   On: 05/20/2020 07:42   DG CHEST PORT 1 VIEW  Result Date: 05/19/2020 CLINICAL DATA:  Follow-up pneumonia EXAM: PORTABLE CHEST 1 VIEW COMPARISON:  05/18/2020 FINDINGS: Cardiac shadow remains enlarged. Persistent airspace opacity is noted in the right lung in the apex and base. Chest tubes are noted on the right without evidence of pneumothorax. Left lung shows some mild atelectatic changes. IMPRESSION: Stable appearance of the chest. Electronically Signed   By: Alcide Clever M.D.   On: 05/19/2020 08:12   DG CHEST PORT 1 VIEW  Result Date: 05/18/2020 CLINICAL DATA:  Follow-up pneumothorax EXAM: PORTABLE CHEST 1 VIEW COMPARISON:  05/16/2020 FINDINGS: Cardiac shadow is stable. Three chest tubes are noted on the right. No pneumothorax is noted. Persistent opacification in the right apex is noted. Left lung is well aerated. No bony abnormality is noted. IMPRESSION: Stable appearance of the chest when  compared with the prior exam. Electronically Signed   By: Alcide Clever M.D.   On: 05/18/2020 10:08   DG CHEST PORT 1 VIEW  Result Date: 05/15/2020 CLINICAL DATA:  Cough. Airspace consolidation. Chest tubes in place. EXAM: PORTABLE CHEST 1 VIEW COMPARISON:  May 14, 2020 FINDINGS: Central catheter has been removed. There are 3 chest tubes on the right, unchanged in position. No appreciable pneumothorax. Extensive airspace consolidation throughout much of the right lung, particularly in the right upper lobe, is stable. There is stable atelectasis in the left base. No new opacity evident. Heart size and pulmonary  vascularity are within normal limits. No adenopathy evident. No bone lesions. IMPRESSION: Extensive airspace consolidation throughout much of the right lung, stable. Left base atelectasis. No new opacity appreciable. Chest tubes remain on the right, unchanged in position, without pneumothorax. Stable cardiac silhouette. Electronically Signed   By: Bretta Bang III M.D.   On: 05/15/2020 08:21   DG CHEST PORT 1 VIEW  Result Date: 05/14/2020 CLINICAL DATA:  Status post VATS, dyspnea EXAM: PORTABLE CHEST 1 VIEW COMPARISON:  05/13/2020 FINDINGS: Right internal jugular central venous catheter with its tip overlying the superior right atrium and 3 right-sided large bore chest tubes are unchanged in position. There is persistent, stable right-sided volume loss with extensive consolidation throughout the right lung, likely infectious, inflammatory, or postsurgical in nature. Consolidation has progressed slightly in the interval since prior examination. Patchy infiltrate has progressed peripherally within the left lung base. No pneumothorax. Cardiac size within normal limits. IMPRESSION: Persistent right-sided volume loss with increasing consolidation within the right lung, likely infectious or inflammatory in nature. Right chest tubes in place. No pneumothorax or definite pleural effusion identified. Increasing patchy infiltrate within the left lung base, likely infectious or inflammatory. Electronically Signed   By: Helyn Numbers MD   On: 05/14/2020 06:26   DG CHEST PORT 1 VIEW  Result Date: 05/13/2020 CLINICAL DATA:  Status post video assisted thoracic surgery. EXAM: PORTABLE CHEST 1 VIEW COMPARISON:  May 12, 2020. FINDINGS: Stable cardiomediastinal silhouette. Stable position of 3 right-sided chest tubes. No pneumothorax is noted. Stable right lung opacity is noted concerning for pneumonia. Stable patchy opacities are noted in the left lung consistent with pneumonia. No pneumothorax is noted. Small  right pleural effusion may be present. Bony thorax is unremarkable. IMPRESSION: Stable position of 3 right-sided chest tubes. No pneumothorax is noted. Stable bilateral lung opacities are noted consistent with pneumonia. Electronically Signed   By: Lupita Raider M.D.   On: 05/13/2020 08:21   DG CHEST PORT 1 VIEW  Result Date: 05/12/2020 CLINICAL DATA:  Recent pneumothorax EXAM: PORTABLE CHEST 1 VIEW COMPARISON:  May 11, 2020 FINDINGS: There are 2 chest tubes now directed superiorly on the right as well as a chest tube directed inferomedially on the right. Central catheter tip is at the cavoatrial junction. There is subcutaneous air on the right but no appreciable pneumothorax. There is extensive airspace consolidation throughout much of the right lung, particularly in the right upper lobe. There is no edema or airspace opacity on the left. Interstitial thickening on the left may in part be due to redistribution of blood flow to viable lung segments. Heart is borderline enlarged with pulmonary vascularity normal. No adenopathy. No bone lesions. IMPRESSION: Tube and catheter positions as described without pneumothorax. Subcutaneous air on the right noted. Extensive consolidation throughout much of the right lung consistent with multifocal pneumonia. Interstitial prominence on the left is likely due primarily to  redistribution of blood flow to viable lung segments. Heart borderline enlarged. Electronically Signed   By: Bretta Bang III M.D.   On: 05/12/2020 16:46   DG CHEST PORT 1 VIEW  Result Date: 05/11/2020 CLINICAL DATA:  Shortness of breath.  Follow-up hydropneumothorax EXAM: PORTABLE CHEST 1 VIEW COMPARISON:  05/10/2020 FINDINGS: Two right chest tubes remain in place without change in position. Persistent volume loss in the right lung with right lung of opacities corresponding to known hydropneumothorax and consolidations. Subcutaneous emphysema in the right chest wall and right neck. Focal  infiltration or atelectasis in the left mid lung. Normal heart size. No significant change since previous study. IMPRESSION: Right hydropneumothorax with right lung consolidations and subcutaneous emphysema. No change since prior study. Electronically Signed   By: Burman Nieves M.D.   On: 05/11/2020 05:41   DG Chest Port 1 View  Result Date: 05/10/2020 CLINICAL DATA:  Pneumohemothorax EXAM: PORTABLE CHEST 1 VIEW COMPARISON:  05/10/2020 at 7:10 a.m. FINDINGS: Right chest tubes remain in place. Similar degree of subcutaneous emphysema tracking along the right chest and neck. Continued consolidation in the upper half of the right lung with indistinct airspace opacity in the lower half of the right lung. There is some bandlike density in the lingula which is unchanged. There is little if any residual pneumomediastinum. If there is any residual pneumothorax on the right, it is poorly apparent on today's semi erect portable chest radiograph. IMPRESSION: 1. No appreciable change in the airspace opacity in the right lung, especially favoring the upper half of the right lung. 2. Stable subcutaneous emphysema along the right chest and neck. 3. No pneumothorax or definite pneumomediastinum visible on today's exam. 4. Stable bandlike opacity in the lingula, probably atelectasis. Electronically Signed   By: Gaylyn Rong M.D.   On: 05/10/2020 19:19   DG CHEST PORT 1 VIEW  Result Date: 05/10/2020 CLINICAL DATA:  Shortness of breath. History of pneumothorax. Right chest tubes. EXAM: PORTABLE CHEST 1 VIEW COMPARISON:  05/09/2020. FINDINGS: Two right chest tubes in stable position. Interim near complete resolution of right pneumothorax with possible very tiny residual right apical pneumothorax. Dense right upper lung consolidation again noted without interim change. Low lung volumes. Borderline cardiomegaly. Stable right chest wall subcutaneous emphysema. IMPRESSION: 1. Two right chest tubes in stable position.  Interim near complete resolution of right pneumothorax with possible very tiny residual right apical pneumothorax. Stable right chest wall subcutaneous emphysema. 2.  Persistent dense right upper lobe consolidation. 3.  Borderline cardiomegaly. Electronically Signed   By: Maisie Fus  Register   On: 05/10/2020 07:22   DG Chest Port 1 View  Result Date: 05/09/2020 CLINICAL DATA:  Shortness of breath, MRSA pneumonia, RIGHT-side chest tube for small RIGHT pneumothorax EXAM: PORTABLE CHEST 1 VIEW COMPARISON:  Portable exam 0827 hours compared to 05/08/2020 FINDINGS: RIGHT thoracostomy tubes unchanged. Normal heart size, mediastinal contours, and pulmonary vascularity. Persistent consolidation RIGHT upper lobe with minimal atelectasis versus infiltrate at lung bases bilaterally greater on RIGHT. Persistent small loculated lateral RIGHT mid chest pneumothorax. Extensive RIGHT chest wall emphysema extending into RIGHT cervical region. No pleural effusion. IMPRESSION: RIGHT thoracostomy tubes with minimal residual RIGHT lateral mid chest pneumothorax. Extensive RIGHT upper lobe consolidation with additional atelectasis versus infiltrate at lung bases RIGHT greater than LEFT. Electronically Signed   By: Ulyses Southward M.D.   On: 05/09/2020 10:19   DG CHEST PORT 1 VIEW  Result Date: 05/08/2020 CLINICAL DATA:  Pneumothorax EXAM: PORTABLE CHEST 1 VIEW COMPARISON:  November 28, 2012 FINDINGS: There is a right-sided chest tube in place. The chest tube may be kinked. There is extensive subcutaneous gas along the patient's right chest wall and right neck. There is diffuse opacification of the right upper lung zone as well as the right lower lung zone. There is a probable small right-sided pneumothorax. No evidence for left-sided pneumothorax. The heart size is unremarkable. IMPRESSION: 1. Right-sided chest tube in place.  The chest tube may be kinked. 2. Probable small right-sided pneumothorax. 3. Diffuse opacification of the right  upper lung zone and portions of the right lower lobe which may be postsurgical or posttraumatic in etiology. Alternatively, this may represent multifocal pneumonia. 4. Subcutaneous gas is noted along the patient's right chest wall and neck. Electronically Signed   By: Katherine Mantle M.D.   On: 05/08/2020 00:33   CT IMAGE GUIDED DRAINAGE BY PERCUTANEOUS CATHETER  Result Date: 05/08/2020 CLINICAL DATA:  History of COVID pneumonia 2 months ago, right upper lobe MRSA pneumonia, right lower lobe pulmonary emboli, complex right hydropneumothorax, incompletely controlled with surgical chest tube. Subcutaneous emphysema and pneumomediastinum. Additional chest drain requested. EXAM: CT GUIDED CHEST DRAIN PLACEMENT ANESTHESIA/SEDATION: Intravenous Fentanyl and Versed 1.5mg  were administered as conscious sedation during continuous monitoring of the patient's level of consciousness and physiological / cardiorespiratory status by the radiology RN, with a total moderate sedation time of 18 minutes. PROCEDURE: The procedure, risks, benefits, and alternatives were explained to the patient. Questions regarding the procedure were encouraged and answered. The patient understands and consents to the procedure. Select axial scans through the chest were obtained. The anterior pneumothorax was localized and an appropriate skin entry site was determined and marked. The operative field was prepped with chlorhexidinein a sterile fashion, and a sterile drape was applied covering the operative field. A sterile gown and sterile gloves were used for the procedure. Local anesthesia was provided with 1% Lidocaine. Under CT fluoroscopic guidance, a percutaneous entry needle advanced into the anterior component of the right pneumothorax. Guidewire was advanced apically. Tract dilated to facilitate placement of a 14 French pigtail catheter, directed towards the apex. Catheter was connected to Pleur-evac suction -20 cm H2O. Follow-up  scan shows good position of chest tube, partial evacuation of loculated anterior component, no immediate complication. Catheter secured externally with 0 Prolene suture and StatLock and covered with Vaseline gauze and sterile gauze dressing. The patient tolerated the procedure well. COMPLICATIONS: None immediate FINDINGS: Anterior right pneumothorax was localized. 14 pigtail drain catheter placed, directed apically. IMPRESSION: Technically successful CT-guided right chest tube placement. Electronically Signed   By: Corlis Leak M.D.   On: 05/08/2020 17:01   VAS [place] LOWER EXTREMITY VENOUS (DVT)  Result Date: 05/14/2020  Lower Venous DVT Study Indications: Assess for residual clot. Other Indications: Per pt, no Hx of DVT- unable to see any prior studies                    suggesting DVT. Risk Factors: Recent?. Performing Technologist: Levada Schilling RDMS, RVT  Examination Guidelines: A complete evaluation includes B-mode imaging, spectral Doppler, color Doppler, and power Doppler as needed of all accessible portions of each vessel. Bilateral testing is considered an integral part of a complete examination. Limited examinations for reoccurring indications may be performed as noted. The reflux portion of the exam is performed with the patient in reverse Trendelenburg.  +---------+---------------+---------+-----------+----------+--------------+ RIGHT    CompressibilityPhasicitySpontaneityPropertiesThrombus Aging +---------+---------------+---------+-----------+----------+--------------+ CFV      Full  Yes      Yes                                 +---------+---------------+---------+-----------+----------+--------------+ SFJ      Full                                                        +---------+---------------+---------+-----------+----------+--------------+ FV Prox  Full                                                         +---------+---------------+---------+-----------+----------+--------------+ FV Mid   Full                                                        +---------+---------------+---------+-----------+----------+--------------+ FV DistalFull                                                        +---------+---------------+---------+-----------+----------+--------------+ PFV      Full                                                        +---------+---------------+---------+-----------+----------+--------------+ POP      Full           Yes      Yes                                 +---------+---------------+---------+-----------+----------+--------------+ PTV      Full                                                        +---------+---------------+---------+-----------+----------+--------------+ PERO     Full                                                        +---------+---------------+---------+-----------+----------+--------------+ GSV      Full                                                        +---------+---------------+---------+-----------+----------+--------------+   +---------+---------------+---------+-----------+----------+--------------+ LEFT     CompressibilityPhasicitySpontaneityPropertiesThrombus  Aging +---------+---------------+---------+-----------+----------+--------------+ CFV      Full           Yes      Yes                                 +---------+---------------+---------+-----------+----------+--------------+ SFJ      Full                                                        +---------+---------------+---------+-----------+----------+--------------+ FV Prox  Full                                                        +---------+---------------+---------+-----------+----------+--------------+ FV Mid   Full                                                         +---------+---------------+---------+-----------+----------+--------------+ FV DistalFull                                                        +---------+---------------+---------+-----------+----------+--------------+ PFV      Full                                                        +---------+---------------+---------+-----------+----------+--------------+ POP      Full           Yes      Yes                                 +---------+---------------+---------+-----------+----------+--------------+ PTV      Full                                                        +---------+---------------+---------+-----------+----------+--------------+ PERO     Full                                                        +---------+---------------+---------+-----------+----------+--------------+ GSV      Full                                                        +---------+---------------+---------+-----------+----------+--------------+  Summary: RIGHT: - There is no evidence of deep vein thrombosis in the lower extremity.  - No cystic structure found in the popliteal fossa.  LEFT: - There is no evidence of deep vein thrombosis in the lower extremity.  - No cystic structure found in the popliteal fossa.  *See table(s) above for measurements and observations. Electronically signed by Waverly Ferrari MD on 05/14/2020 at 2:06:52 PM.    Final     Labs:  CBC: Recent Labs    05/20/20 0246 05/21/20 0045 05/22/20 0721 05/23/20 0310  WBC 3.8* 3.9* 5.2 5.1  HGB 8.0* 8.9* 9.3* 9.7*  HCT 26.7* 28.0* 29.9* 31.2*  PLT 97* 100* 144* 158    COAGS: Recent Labs    05/11/20 1135  INR 1.0  APTT 33    BMP: Recent Labs    05/18/20 0236 05/18/20 0455 05/19/20 0159 05/20/20 0246 05/21/20 0045 05/21/20 1858  NA 138  --  135 138 137  --   K 4.7  --  4.6 4.7 3.9  --   CL 88*  --  86* 89* 90*  --   CO2 39*  --  42* 41* 37*  --   GLUCOSE 380*   < > 302* 185* 198*  422*  BUN 11  --  --   CALCIUM 8.7*  --  8.3* 8.4* 8.8*  --   CREATININE 0.63  --  0.64 0.77 0.67  --   GFRNONAA >60  --  >60 >60 >60  --    < > = values in this interval not displayed.    LIVER FUNCTION TESTS: Recent Labs    05/18/20 0236 05/19/20 0159 05/20/20 0246 05/21/20 0045  BILITOT 0.6 0.8 0.8 0.8  AST 13*  ALT ALKPHOS 60 58 58 60  PROT 5.0* 4.7* 4.7* 5.2*  ALBUMIN 1.7* 1.7* 1.8* 1.9*    TUMOR MARKERS: No results for input(s): AFPTM, CEA, CA199, CHROMGRNA in the last 8760 hours.  Assessment and Plan: Subcutaneous emphysema Patient POD 11 VATS/pleurodesis, evacuation of chest wall hematoma. Initially progressing well, nearing discharge, however now with recurrence of hemopneumothorax after chest tube removal 12/6.  Despite replacement of 2 large bore chest tubes, she continues with anterior pneumothorax which is not resolved.  IR consulted for replacement of chest tube.  Patient assessed at bedside. She is agreeable to proceed.  She has been NPO.  Case reviewed by Dr. Archer Asa.  Plan to proceed in CT today.   Risks and benefits discussed with the patient including bleeding, infection, damage to adjacent structures, and sepsis.  All of the patient's questions were answered, patient is agreeable to proceed. Consent signed and in chart.  Thank you for this interesting consult.  I greatly enjoyed meeting Annette [name] and look forward to participating in their care.  A copy of this report was sent to the requesting provider on this date.  Electronically Signed: Hoyt Koch, PA 05/23/2020, 12:47 PM   I spent a total of 40 Minutes    in face to face in clinical consultation, greater than 50% of which was counseling/coordinating care for pneumothorax.

## 2020-05-23 NOTE — Progress Notes (Signed)
Physical Therapy Treatment Patient Details Name: Annette [name] MRN: [id number] DOB: 1960/07/17 Today's Date: 05/23/2020    History of Present Illness 59 yo female admitted to [facility] on 05/03/2020 with R upper lobe MRSA pneumonia and R lower lobe subsegmental PE. Developed right-sided hydropneumothorax on 11/22 underwent chest tube insertion and on 05/06/2020 transfered to [facility] for CTS evaluation and treatment. Additional R chest tube placement 11/24.  On 11/28, patient underwent VATS/thoracotomy mechanical pleurodesis and wound VAC placement. 3rd chest tube to be placed 12/9.  PMH including Covid pneumonia approximately 2 months ago, recent PE on Eliquis, COPD, asthma, depression, hypertension, and diabetes.    PT Comments    Pt admitted with above diagnosis. Pt was able to do bed level exercises and some attempts at unsupported sitting in bed in chair position. Pt was attempting to work with PT but is limited by pain and weakness. Will continue to progress pt as able.  Pt currently with functional limitations due to the deficits listed below (see PT Problem List). Pt will benefit from skilled PT to increase their independence and safety with mobility to allow discharge to the venue listed below.    Follow Up Recommendations  Supervision/Assistance - 24 hour;SNF     Equipment Recommendations  3in1 (PT)    Recommendations for Other Services       Precautions / Restrictions Precautions Precautions: Fall Precaution Comments: 3 Chest tubes Restrictions Weight Bearing Restrictions: No    Mobility  Bed Mobility               General bed mobility comments: Pt bed progressively inclined to full chair position but did not move it all the way to floor. Pt performed UE and LE exercises as well as ADLS with OT in chair position. Also attempted to have pt reach foward with bed rails with pt not able to lean forward pulling on bed raails but was able to lean forward  pulling on PT and OTs hands to lean forward and get back off bed.  LEft pt in 50 degree chair position with feet reclined as she did not like the feeling of them hanging.  Transfers                 General transfer comment: Unable to transfer today  Ambulation/Gait Ambulation/Gait assistance:  (Unable)               Stairs             Wheelchair Mobility    Modified Rankin (Stroke Patients Only)       Balance Overall balance assessment: Needs assistance Sitting-balance support: Bilateral upper extremity supported;Feet unsupported Sitting balance-Leahy Scale: Zero Sitting balance - Comments: Needs assist and UE support to lean forward in bed without back support                                    Cognition Arousal/Alertness: Awake/alert Behavior During Therapy: WFL for tasks assessed/performed Overall Cognitive Status: Within Functional Limits for tasks assessed                                        Exercises General Exercises - Lower Extremity Ankle Circles/Pumps: AROM;Both;20 reps;Seated Quad Sets: AROM;Both;10 reps;Supine Long Arc Quad: AROM;Both;10 reps;Seated Heel Slides:  (Pt refuses as her hips hurt)  General Comments General comments (skin integrity, edema, etc.): Pt was on 8LO2 with VSS for activity today.      Pertinent Vitals/Pain Pain Assessment: Faces Faces Pain Scale: Hurts even more Pain Location: back, L LE, chest tube sites Pain Descriptors / Indicators: Burning;Sore Pain Intervention(s): Limited activity within patient's tolerance;Monitored during session;Repositioned    Home Living                      Prior Function            PT Goals (current goals can now be found in the care plan section) Acute Rehab PT Goals Patient Stated Goal: to get some rehab and get stronger Progress towards PT goals: Progressing toward goals    Frequency    Min 2X/week      PT Plan Current  plan remains appropriate    Co-evaluation PT/OT/SLP Co-Evaluation/Treatment: Yes Reason for Co-Treatment: For patient/therapist safety;Complexity of the patient's impairments (multi-system involvement) PT goals addressed during session: Mobility/safety with mobility;Strengthening/ROM        AM-PAC PT "6 Clicks" Mobility   Outcome Measure  Help needed turning from your back to your side while in a flat bed without using bedrails?: A Lot Help needed moving from lying on your back to sitting on the side of a flat bed without using bedrails?: A Lot Help needed moving to and from a bed to a chair (including a wheelchair)?: A Lot Help needed standing up from a chair using your arms (e.g., wheelchair or bedside chair)?: Total Help needed to walk in hospital room?: Total Help needed climbing 3-5 steps with a railing? : Total 6 Click Score: 9    End of Session Equipment Utilized During Treatment: Oxygen;Gait belt Activity Tolerance: Patient limited by fatigue Patient left: with call bell/phone within reach;in bed;with SCD's reapplied Nurse Communication: Mobility status PT Visit Diagnosis: Muscle weakness (generalized) (M62.81)     Time: 1131-1150 PT Time Calculation (min) (ACUTE ONLY): 19 min  Charges:  $Therapeutic Activity: 8-22 mins                     Ramel Tobon W,PT Acute Rehabilitation Services Pager:  [phone]  Office:  [phone]     Berline Lopes 05/23/2020, 1:12 PM

## 2020-05-23 NOTE — Progress Notes (Signed)
Occupational Therapy Treatment Patient Details Name: Annette [name] MRN: [id number] DOB: 1960/09/02 Today's Date: 05/23/2020    History of present illness 59 yo female admitted to [facility] on 05/03/2020 with R upper lobe MRSA pneumonia and R lower lobe subsegmental PE. Developed right-sided hydropneumothorax on 11/22 underwent chest tube insertion and on 05/06/2020 transfered to [facility] for CTS evaluation and treatment. Additional R chest tube placement 11/24.  On 11/28, patient underwent VATS/thoracotomy mechanical pleurodesis and wound VAC placement. 3rd chest tube to be placed 12/9.  PMH including Covid pneumonia approximately 2 months ago, recent PE on Eliquis, COPD, asthma, depression, hypertension, and diabetes.   OT comments  Patient with difficult hospital stay.  Recently transferred to ICU and 3 chest tubes placed.  Limited session this date.  Deficits listed below.  She also has limited vision because of severely swollen eyes.  Her functional mobility and ADL status has declined from the original eval.  OT to continue to follow in the acute setting, SNF has been recommended post acute, goals for acute may need to be adjusted.     Follow Up Recommendations  SNF    Equipment Recommendations  3 in 1 bedside commode;Other (comment)    Recommendations for Other Services      Precautions / Restrictions Precautions Precautions: Fall Precaution Comments: 3 Chest tubes Restrictions Weight Bearing Restrictions: No       Mobility Bed Mobility               General bed mobility comments: Pt bed progressively inclined to full chair position but did not move it all the way to floor. Pt performed UE and LE exercises as well as ADLS with OT in chair position. Also attempted to have pt reach foward with bed rails with pt not able to lean forward pulling on bed raails but was able to lean forward pulling on PT and OTs hands to lean forward and get back off bed.  LEft  pt in 50 degree chair position with feet reclined as she did not like the feeling of them hanging.  Transfers                 General transfer comment: Unable to transfer today    Balance Overall balance assessment: Needs assistance Sitting-balance support: Bilateral upper extremity supported;Feet unsupported Sitting balance-Leahy Scale: Zero Sitting balance - Comments: Needs assist and UE support to lean forward in bed without back support                                   ADL either performed or assessed with clinical judgement   ADL       Grooming: Wash/dry face;Wash/dry hands;Supervision/safety;Set up;Sitting Grooming Details (indicate cue type and reason): bed in sit position         Upper Body Dressing : Moderate assistance;Sitting Upper Body Dressing Details (indicate cue type and reason): bed in sit position Lower Body Dressing: Maximal assistance;Bed level                       Vision   Additional Comments: swelling to both eyes - R is swollen shut, L is barely open.              Cognition Arousal/Alertness: Awake/alert Behavior During Therapy: WFL for tasks assessed/performed Overall Cognitive Status: Within Functional Limits for tasks assessed  Exercises Fist pumps, elbow flex/ext, chest press.     Shoulder Instructions       General Comments Pt was on 8LO2 with VSS for activity today.    Pertinent Vitals/ Pain       Pain Assessment: Faces Faces Pain Scale: Hurts even more Pain Location: back, L LE, chest tube sites Pain Descriptors / Indicators: Burning Pain Intervention(s): Monitored during session                                                          Frequency  Min 2X/week        Progress Toward Goals  OT Goals(current goals can now be found in the care plan section)  Progress towards OT goals: Not progressing toward  goals - comment (patient transitioned to cardiac ICU with placement of 3 chest tube.)  Acute Rehab OT Goals Patient Stated Goal: stop hurting so I can move better OT Goal Formulation: With patient Time For Goal Achievement: 05/28/20 Potential to Achieve Goals: Fair  Plan Discharge plan remains appropriate    Co-evaluation      Reason for Co-Treatment: For patient/therapist safety;Complexity of the patient's impairments (multi-system involvement) PT goals addressed during session: Mobility/safety with mobility;Strengthening/ROM        AM-PAC OT "6 Clicks" Daily Activity     Outcome Measure   Help from another person eating meals?: A Little Help from another person taking care of personal grooming?: A Little Help from another person toileting, which includes using toliet, bedpan, or urinal?: A Lot Help from another person bathing (including washing, rinsing, drying)?: A Lot Help from another person to put on and taking off regular upper body clothing?: A Lot Help from another person to put on and taking off regular lower body clothing?: A Lot 6 Click Score: 14    End of Session Equipment Utilized During Treatment: Oxygen  OT Visit Diagnosis: Unsteadiness on feet (R26.81);Other abnormalities of gait and mobility (R26.89);Muscle weakness (generalized) (M62.81)   Activity Tolerance Patient limited by pain   Patient Left in bed;with call bell/phone within reach   Nurse Communication          Time: 1141-1150 OT Time Calculation (min): 9 min  Charges: OT General Charges $OT Visit: 1 Visit OT Treatments $Self Care/Home Management : 8-22 mins  05/23/2020  Rich, OTR/L  Acute Rehabilitation Services  Office:  [phone]    Suzanna Obey 05/23/2020, 1:28 PM

## 2020-05-23 NOTE — Procedures (Signed)
Interventional Radiology Procedure Note  Procedure: Placement of a 48F pigtail chest tube into residual anterior PTX.   Complications: None  Estimated Blood Loss: None  Recommendations: - Chest tube to LWS via Pleur-Evac   Signed,  Sterling Big, MD

## 2020-05-23 NOTE — Progress Notes (Addendum)
05/23/2020   I have seen and evaluated the patient for pneumothorax.  S:  No events, having lots of pain at chest tube site.  O: Blood pressure (!) 136/106, pulse 95, temperature 97.7 F (36.5 C), temperature source Axillary, resp. rate 20, height 5\' 8"  (1.727 m), weight 99.1 kg, SpO2 95 %.  Ill appearing woman lying in bed Extensive bilateral subcutaneous air tracking up to face Chest tubes in place without clear air leak  A:  Bilateral pneumothorax with extensive subcutaneous air- partial evacuation with existing tubes, sizable anterior pocket remains, TCTS would like IR to drain this. Recent right VATS/pleurodesis with evacuation of chest wall hematoma from chest tube placed at OSH PE MRSA pneumonia s/p abx S/p COVID COPD DM2  P:  - Hold AC until after new pigtail - Agree with chest tubes to suction - IS, flutter - Continue current lantus/novolog - Stable for transfer to stepdown since not going to OR, appreciate [facility] taking back over care  05/23/2020 14/02/2020 MD     NAME:  Fergie Sherbert, MRN:  [name], DOB:  09-15-1960, LOS: 16 ADMISSION DATE:  05/07/2020, CONSULTATION DATE: 05/21/2020 REFERRING MD:  Dr 14/12/2019, CHIEF COMPLAINT: Subcuticular air  Brief History   59 year old female with a history of Covid pneumonia since resolved that is still dealing with the complications.  She has had recurrent pneumothorax.  She developed a pneumothorax on the right side 12/7 and required chest tube placement. ->moved to ICU as had sig [place] air and was concern for airway protection  Past Medical History  COVID-19 pneumonia MRSA pneumonia COPD Hypertension Hyperlipidemia Subsegmental pulmonary embolism on DOAC Right pneumohemothorax requiring VATS w/ pleurodesis. Complicated by right chest wall hematoma   Significant Hospital Events   Development of right pneumothorax on 12/7, seen by thoracic surgery chest tube placed.  Moved to the intensive care given concern about possible  respiratory failure 12/8: Chest x-ray fairly stable but still has massive subcutaneous air, the right chest tube is malpositioned with side-port of the chest wall thoracic surgery planning on replacing chest tube   Procedures:  VATS procedure 05/06/2020 at outside facility 05/20/2020 chest tube removed 05/21/2020 right-sided chest tube placed (CVTS) 05/23/2020 interventional radiology plus anterior chest tube right  Micro Data:  COVID-19 negative currently  Antimicrobials:  None  Subjective Complains of pain Objective   Blood pressure (!) 136/106, pulse 95, temperature 97.7 F (36.5 C), temperature source Axillary, resp. rate 20, height 5\' 8"  (1.727 m), weight 99.1 kg, SpO2 95 %.        Intake/Output Summary (Last 24 hours) at 05/23/2020 0928 Last data filed at 05/23/2020 0800 Gross per 24 hour  Intake 220 ml  Output 1160 ml  Net -940 ml   Filed Weights   05/22/20 0042 05/22/20 0431 05/23/20 0326  Weight: 100.1 kg 100.1 kg 99.1 kg    Examination: General: Obese female who is currently sleeping but arousable HEENT: MM pink/moist no JVD Neuro: Intact CV: Sounds are distant PULM: Breath sounds chest tube with air leak right chest unremarkable Massive subcu air GI: soft, bsx4 active  Extremities: warm/dry,  edema  Skin: no rashes or lesions   Resolved Hospital Problem list     Assessment & Plan:  Recurrent Bilateral pneumothorax w/ massive [place] air. 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  Right fibrothorax s/p recent complicated right pleural space from hemothorax +/- infection  (completed abx)  s/p VATS and right pleurodesis for spont hemothorax with right chest tube already in place for ptx ->now s/p right  chest tube 12/7   Plan IR to place anterior chest tube right S1 chest to bedside port and chest wall Radiographic evaluation Leave in ICU for 24 hours    Pulmonary embolism (non-occlusive RLL dx'd 11/26)  Plan Holding anticoagulation Getting a new chest tube in  interventional radiology 05/23/2020 Consideration to anticoagulation can be reevaluated in future   Chronic respiratory failure 2/2 prob post-covid pulm fibrosis c/b underlying COPD and recent MRSA PNA  Plan O2 as needed  Anemia & thrombocytopenia  Recent Labs    05/22/20 0721 05/23/20 0310  HGB 9.3* 9.7*    Plan Transfuse per protocol  DM type II  CBG (last 3)  Recent Labs    05/22/20 2327 05/23/20 0335 05/23/20 0746  GLUCAP 151* 107* 125*    Plan Sliding-scale insulin protocol  Stage II pressure ulcer Plan Continue wound care  Chronic anxiety  Plan Continue angiolytics   Best practice (evaluated daily)   Diet: N.p.o. at this moment but as her condition stabilizes would resume p.o. intake. Pain/Anxiety/Delirium protocol (if indicated): Tramadol and morphine VAP protocol (if indicated): N/A DVT prophylaxis: SCDs only GI prophylaxis: Protonix Glucose control: Insulin sliding scale Mobility: Bedrest Code Status: Partial DNR Goals of care discussion due 12/15 Disposition: icu Critical care time:NA   Brett Canales Minor ACNP Acute Care Nurse Practitioner [facility] Pulmonary/Critical Care Please consult Amion 05/23/2020, 9:28 AM

## 2020-05-23 NOTE — Progress Notes (Signed)
T[facility] DAILY ICU PROGRESS NOTE                   [address]            [place] [address]          [phone]   6 Days Post-Op Procedure(s) (LRB): RIGHT CHEST WALL WOUND CLOSURE REMOVAL OF WOUND VAC (Right)  Total Length of Stay:  LOS: 16 days   Subjective: Resting in bed, alert and oriented.  Now on 8LncO2.  Objective: Vital signs in last 24 hours: Temp:  [97.7 F (36.5 C)-99.6 F (37.6 C)] 97.7 F (36.5 C) (12/09 0705) Pulse Rate:  [85-99] 98 (12/09 0705) Cardiac Rhythm: Normal sinus rhythm (12/09 0400) Resp:  [14-26] 23 (12/09 0705) BP: (102-132)/(62-82) 132/81 (12/09 0705) SpO2:  [88 %-100 %] 100 % (12/09 0705) Weight:  [99.1 kg] 99.1 kg (12/09 0326)  Filed Weights   05/22/20 0042 05/22/20 0431 05/23/20 0326  Weight: 100.1 kg 100.1 kg 99.1 kg    Weight change: -1 kg   Hemodynamic parameters for last 24 hours:    Intake/Output from previous day: 12/08 0701 - 12/09 0700 In: 290 [P.O.:290] Out: 1010 [Urine:950; Chest Tube:60]  Intake/Output this shift: No intake/output data recorded.  Current Meds: Scheduled Meds: . atorvastatin  40 mg Oral Daily  . bisacodyl  10 mg Oral Daily  . Chlorhexidine Gluconate Cloth  6 each Topical Daily  . cholecalciferol  1,000 Units Oral Daily  . clonazePAM  0.5 mg Oral BID  . fenofibrate  160 mg Oral Daily  . furosemide  40 mg Intravenous BID  . Gerhardt's butt cream   Topical BID  . guaiFENesin  600 mg Oral BID  . insulin aspart  0-15 Units Subcutaneous Q4H  . insulin glargine  15 Units Subcutaneous QHS  . lidocaine  1 patch Transdermal Q24H  . multivitamin with minerals  1 tablet Oral Daily  . nystatin   Topical BID  . pregabalin  150 mg Oral TID  . senna-docusate  1 tablet Oral QHS  . sodium chloride flush  10-40 mL Intracatheter Q12H  . umeclidinium bromide  1 puff Inhalation Daily   Continuous Infusions: PRN Meds:.influenza vac split quadrivalent PF, levalbuterol, magnesium citrate, morphine  injection, ondansetron (ZOFRAN) IV, oxyCODONE, pneumococcal 23 valent vaccine, polyethylene glycol, sodium chloride flush, traMADol  General appearance: alert, cooperative and moderate distress. The subQ air has extended into her face and right eyelid.  Neurologic: intact Heart: regular rate and rhythm Lungs: Extensive subQ air over the chest bilaterally. O2 sat 96% on 8LncO2. Respirations are not labored. Minimal chest tube drainage, no air leak at either PleurEvac. Abdomen: soft,NT.  Wound: the right chest wound is intact and dry.   Lab Results: CBC: Recent Labs    05/22/20 0721 05/23/20 0310  WBC 5.2 5.1  HGB 9.3* 9.7*  HCT 29.9* 31.2*  PLT 144* 158   BMET:  Recent Labs    05/21/20 0045 05/21/20 1858  NA 137  --   K 3.9  --   CL 90*  --   CO2 37*  --   GLUCOSE 198* 422*  BUN 15  --   CREATININE 0.67  --   CALCIUM 8.8*  --     CMET: Lab Results  Component Value Date   WBC 5.1 05/23/2020   HGB 9.7 (L) 05/23/2020   HCT 31.2 (L) 05/23/2020   PLT 158 05/23/2020   GLUCOSE 422 (H) 05/21/2020   ALT  14 05/21/2020   AST 13 (L) 05/21/2020   NA 137 05/21/2020   K 3.9 05/21/2020   CL 90 (L) 05/21/2020   CREATININE 0.67 05/21/2020   BUN 15 05/21/2020   CO2 37 (H) 05/21/2020   INR 1.0 05/11/2020   HGBA1C 10.7 (H) 05/08/2020      PT/INR: No results for input(s): LABPROT, INR in the last 72 hours. Radiology: DG Chest 1 View  Result Date: 05/22/2020 CLINICAL DATA:  Pneumothorax on right. Additional history provided: VATS/thoracotomy 05/12/2020. EXAM: CHEST  1 VIEW COMPARISON:  Chest radiograph performed earlier today at 5:19 a.m. FINDINGS: Apparent slight interval retraction of the more superiorly located right-sided chest tube with the side port just external to the chest wall. Interval placement of an additional chest tube projecting in the region of the right lung base. Interval progression of opacity at the right lung base which may reflect atelectasis. Redemonstrated  extensive airspace disease throughout the remainder of the right lung. No appreciable right pneumothorax. Unchanged small left pneumothorax. Heart size within normal limits. Redemonstrated extensive subcutaneous emphysema. Impression 1 below will be called to the ordering clinician or representative by the Radiologist Assistant, and communication documented in the PACS or [employer]. IMPRESSION: Slight interval retraction of the more superiorly located right chest tube, now with the side port positioned just external to the chest wall. Interval placement of a second chest tube projecting in the region of the right lung base. No appreciable right pneumothorax Interval progression of opacity at the right lung base, which may reflect atelectasis. Redemonstrated extensive airspace disease throughout the remainder of the right lung. Unchanged small left pneumothorax. Redemonstrated extensive subcutaneous emphysema. Electronically Signed   By: Jackey Loge DO   On: 05/22/2020 12:21   CT CHEST WO CONTRAST  Result Date: 05/22/2020 CLINICAL DATA:  Pneumothorax status post fat EXAM: CT CHEST WITHOUT CONTRAST TECHNIQUE: Multidetector CT imaging of the chest was performed following the standard protocol without IV contrast. COMPARISON:  CT dated May 10, 2020 FINDINGS: Cardiovascular: The heart size is stable. Minimal aortic calcifications are noted. There is no significant pericardial effusion. Mediastinum/Nodes: -- No mediastinal lymphadenopathy. -- No hilar lymphadenopathy. -- No axillary lymphadenopathy. -- No supraclavicular lymphadenopathy. -- Normal thyroid gland where visualized. -  Unremarkable esophagus. Lungs/Pleura: There is a moderate-sized right-sided hydropneumothorax. The air component has decreased from the prior study. There is a small left-sided pneumothorax. Extensive pneumomediastinum is noted. There is extensive consolidation involving the right upper lobe. There is moderate consolidation  involving the right middle lobe. There is complete obstruction of the right lower lobe bronchus with extensive consolidation involving the right lower lobe. There is a small complex right-sided pleural effusion containing blood products. The superior most chest tube is barely within the thoracic cavity. The side hole is within the subcutaneous soft tissues of the right flank. Surrounding the superior right test tube is a right-sided hematoma which has decreased in size from the prior study. There is a new inferiorly oriented right-sided chest tube that is well positioned within the thoracic cavity. Extensive subcutaneous gas is noted. This has substantially worsened since the prior CT in November. There is a new defect in the chest wall between the fourth and fifth ribs measuring approximately 4.3 cm (axial series 3, image 77). There is a collection of air extending into the right breast tissue arising from this defect. Upper Abdomen: No acute abnormality is noted in the upper abdomen. Again noted is a simple appearing hepatic cyst. Musculoskeletal: No chest  wall abnormality. No bony spinal canal stenosis. IMPRESSION: 1. As described on previous chest x-rays, the superior most right-sided chest tube is suboptimally positioned. The tube is barely within the thoracic cavity and the side hole is within the soft tissues of the right flank. This may be contributing to the patient's extensive subcutaneous emphysema. 2. New large defect in the thoracic cavity between the fourth and fifth ribs anterolaterally on the right. This too is likely contributing to the patient's extensive subcutaneous emphysema. 3. Moderate right-sided pneumothorax, improved from prior study. New small left-sided pneumothorax. Extensive pneumomediastinum. 4. Small right-sided hemothorax. 5. New well-positioned right-sided chest tube terminating in the lower right thoracic cavity. 6. There is extensive consolidation throughout the right lung with  complete opacification of the right lower lobe bronchus. Coarse bilateral airspace opacities are noted. Aortic Atherosclerosis (ICD10-I70.0). These results will be called to the ordering clinician or representative by the Radiologist Assistant, and communication documented in the PACS or [employer]. Electronically Signed   By: Katherine Mantle M.D.   On: 05/22/2020 19:18   DG CHEST PORT 1 VIEW  Result Date: 05/23/2020 CLINICAL DATA:  Chest tube.  Shortness of breath. EXAM: PORTABLE CHEST 1 VIEW COMPARISON:  CT 05/22/2020.  Chest x-ray 05/22/2020. FINDINGS: Superior right chest tube side hole again noted in the soft tissues of the right chest wall in unchanged position. Inferior right chest tube in stable position. Persistent right upper lung consolidation. Persistent bilateral atelectatic changes and interstitial changes. Heart size stable. Extensive diffuse bilateral neck and chest wall subcutaneous emphysema again noted without interim change. Reference is made to prior CT report. Known bilateral pneumothoraces poorly visualized. IMPRESSION: 1. Superior right chest tube side hole again noted in the soft tissues of the right chest wall in unchanged position. Inferior right chest tube in stable position. Known bilateral pneumothoraces poorly visualized. Extensive diffuse bilateral neck and chest wall subcutaneous emphysema again noted without interim change. Reference is made to chest CT report of 05/22/2020. 2. Persistent right upper lung consolidation. Persistent bilateral atelectatic changes and interstitial changes. Electronically Signed   By: Maisie Fus  Register   On: 05/23/2020 07:19     Assessment/Plan: S/P Procedure(s) (LRB): RIGHT CHEST WALL WOUND CLOSURE REMOVAL OF WOUND VAC (Right)  -POD11 right VATS / mechanical pleurodesis with evacuation of a large chest wall hematoma post chest tube placement at OSH, POD6 closure of right chest wall wound after treatment with a wound vac x 5 days.  Chest tubes removed 12/6 after several days of minimal drainage and no air leak. She developed extensive subQ air to the chest and neck about 24 hours later and had a chest tube placed at the bedside. CT position was sub-optimal and there was no significant  improvement in the subQ air. A second chest tube was placed at the bedside on 12/8 but despite this, the subQ air has progressed into her face and right eyelid.  Chest CT reviewed, results above. There is a moderate right anterior pneumothorax that is not being adequately drained by the two existing pleural tubes.  IR consult requested for CT-guided chest tube placement today. Leary Roca, PA-C [phone] 05/23/2020 8:43 AM

## 2020-05-24 ENCOUNTER — Inpatient Hospital Stay (HOSPITAL_COMMUNITY): Payer: Medicare Other

## 2020-05-24 ENCOUNTER — Other Ambulatory Visit: Payer: Self-pay | Admitting: Cardiothoracic Surgery

## 2020-05-24 DIAGNOSIS — J939 Pneumothorax, unspecified: Secondary | ICD-10-CM

## 2020-05-24 DIAGNOSIS — J9381 Chronic pneumothorax: Secondary | ICD-10-CM

## 2020-05-24 LAB — CBC WITH DIFFERENTIAL/PLATELET
Abs Immature Granulocytes: 0.02 10*3/uL (ref 0.00–0.07)
Basophils Absolute: 0 10*3/uL (ref 0.0–0.1)
Basophils Relative: 0 %
Eosinophils Absolute: 0.2 10*3/uL (ref 0.0–0.5)
Eosinophils Relative: 3 %
HCT: 29.6 % — ABNORMAL LOW (ref 36.0–46.0)
Hemoglobin: 9 g/dL — ABNORMAL LOW (ref 12.0–15.0)
Immature Granulocytes: 0 %
Lymphocytes Relative: 18 %
Lymphs Abs: 1.2 10*3/uL (ref 0.7–4.0)
MCH: 28.8 pg (ref 26.0–34.0)
MCHC: 30.4 g/dL (ref 30.0–36.0)
MCV: 94.9 fL (ref 80.0–100.0)
Monocytes Absolute: 0.6 10*3/uL (ref 0.1–1.0)
Monocytes Relative: 9 %
Neutro Abs: 4.6 10*3/uL (ref 1.7–7.7)
Neutrophils Relative %: 70 %
Platelets: 206 10*3/uL (ref 150–400)
RBC: 3.12 MIL/uL — ABNORMAL LOW (ref 3.87–5.11)
RDW: 19.9 % — ABNORMAL HIGH (ref 11.5–15.5)
WBC: 6.7 10*3/uL (ref 4.0–10.5)
nRBC: 0 % (ref 0.0–0.2)

## 2020-05-24 LAB — GLUCOSE, CAPILLARY
Glucose-Capillary: 228 mg/dL — ABNORMAL HIGH (ref 70–99)
Glucose-Capillary: 269 mg/dL — ABNORMAL HIGH (ref 70–99)
Glucose-Capillary: 274 mg/dL — ABNORMAL HIGH (ref 70–99)
Glucose-Capillary: 290 mg/dL — ABNORMAL HIGH (ref 70–99)
Glucose-Capillary: 292 mg/dL — ABNORMAL HIGH (ref 70–99)
Glucose-Capillary: 469 mg/dL — ABNORMAL HIGH (ref 70–99)

## 2020-05-24 LAB — BASIC METABOLIC PANEL
Anion gap: 11 (ref 5–15)
BUN: 31 mg/dL — ABNORMAL HIGH (ref 6–20)
CO2: 33 mmol/L — ABNORMAL HIGH (ref 22–32)
Calcium: 8.6 mg/dL — ABNORMAL LOW (ref 8.9–10.3)
Chloride: 90 mmol/L — ABNORMAL LOW (ref 98–111)
Creatinine, Ser: 0.86 mg/dL (ref 0.44–1.00)
GFR, Estimated: >60 mL/min (ref >60)
Glucose, Bld: 170 mg/dL — ABNORMAL HIGH (ref 70–99)
Potassium: 3.9 mmol/L (ref 3.5–5.1)
Sodium: 134 mmol/L — ABNORMAL LOW (ref 135–145)

## 2020-05-24 LAB — PHOSPHORUS: Phosphorus: 3.8 mg/dL (ref 2.5–4.6)

## 2020-05-24 LAB — MAGNESIUM: Magnesium: 2 mg/dL (ref 1.7–2.4)

## 2020-05-24 MED ORDER — HYDROMORPHONE HCL 1 MG/ML IJ SOLN
1.0000 mg | INTRAMUSCULAR | Status: DC | PRN
  Administered 2020-05-24 – 2020-05-25 (×6): 1 mg via INTRAVENOUS
  Filled 2020-05-24 (×5): qty 1

## 2020-05-24 MED ORDER — ACETAMINOPHEN 325 MG PO TABS
650.0000 mg | ORAL_TABLET | Freq: Four times a day (QID) | ORAL | Status: DC | PRN
  Administered 2020-05-24 – 2020-05-25 (×2): 650 mg via ORAL
  Filled 2020-05-24 (×2): qty 2

## 2020-05-24 MED ORDER — INSULIN GLARGINE 100 UNIT/ML ~~LOC~~ SOLN
20.0000 [IU] | Freq: Every day | SUBCUTANEOUS | Status: DC
  Administered 2020-05-24: 20 [IU] via SUBCUTANEOUS
  Filled 2020-05-24 (×2): qty 0.2

## 2020-05-24 MED ORDER — HYDROMORPHONE HCL 1 MG/ML IJ SOLN
INTRAMUSCULAR | Status: AC
  Filled 2020-05-24: qty 1

## 2020-05-24 NOTE — Progress Notes (Signed)
T[facility] DAILY ICU PROGRESS NOTE                   [address]            [place] [address]          [phone]   7 Days Post-Op Procedure(s) (LRB): RIGHT CHEST WALL WOUND CLOSURE REMOVAL OF WOUND VAC (Right)  Total Length of Stay:  LOS: 17 days   Subjective:  Transferred from ICU to 2W yesterday.  Resting in bed, alert and oriented.  Very tearful and anxious about having a chest tube removed this morning.   Now on 6LncO2.  Objective: Vital signs in last 24 hours: Temp:  [97.8 F (36.6 C)-98.7 F (37.1 C)] 98 F (36.7 C) (12/10 0000) Pulse Rate:  [90-107] 92 (12/10 0400) Cardiac Rhythm: Normal sinus rhythm;Bundle branch block (12/10 0728) Resp:  [14-24] 17 (12/10 0400) BP: (100-129)/(60-86) 113/62 (12/10 0804) SpO2:  [92 %-100 %] 92 % (12/10 0400) Weight:  [100.2 kg] 100.2 kg (12/10 0430)  Filed Weights   05/22/20 0431 05/23/20 0326 05/24/20 0430  Weight: 100.1 kg 99.1 kg 100.2 kg    Weight change: 1.1 kg      Intake/Output from previous day: 12/09 0701 - 12/10 0700 In: 120 [P.O.:120] Out: 1592 [Urine:1550; Chest Tube:42]  Intake/Output this shift: No intake/output data recorded.  Current Meds: Scheduled Meds: . atorvastatin  40 mg Oral Daily  . bisacodyl  10 mg Oral Daily  . Chlorhexidine Gluconate Cloth  6 each Topical Daily  . cholecalciferol  1,000 Units Oral Daily  . clonazePAM  0.5 mg Oral BID  . fenofibrate  160 mg Oral Daily  . furosemide  40 mg Intravenous BID  . Gerhardt's butt cream   Topical BID  . guaiFENesin  600 mg Oral BID  . HYDROmorphone      . insulin aspart  0-15 Units Subcutaneous Q4H  . insulin glargine  15 Units Subcutaneous QHS  . lidocaine  1 patch Transdermal Q24H  . multivitamin with minerals  1 tablet Oral Daily  . nystatin   Topical BID  . pregabalin  150 mg Oral TID  . senna-docusate  1 tablet Oral QHS  . sodium chloride flush  10-40 mL Intracatheter Q12H  . umeclidinium bromide  1 puff Inhalation  Daily   Continuous Infusions: PRN Meds:.HYDROmorphone (DILAUDID) injection, influenza vac split quadrivalent PF, levalbuterol, magnesium citrate, ondansetron (ZOFRAN) IV, oxyCODONE, pneumococcal 23 valent vaccine, polyethylene glycol, sodium chloride flush, traMADol  General appearance: alert, cooperative and moderate distress. The subQ air is about the same but now involves both eyelids.  Lungs: Extensive subQ air over the chest bilaterally extending into the neck and face. O2 sat 90% on 6LncO2. Respirations are not labored. Continues to have a coarse, wet productive cough. A new 59fr pigtail catheter exits the right chest.  Minimal chest tube drainage, no air leak at either PleurEvac. Abdomen: soft,NT.  Wound: the right chest wound is intact and dry.   Lab Results: CBC: Recent Labs    05/23/20 0310 05/24/20 0500  WBC 5.1 6.7  HGB 9.7* 9.0*  HCT 31.2* 29.6*  PLT 158 206   BMET:  Recent Labs    05/23/20 1259 05/24/20 0500  NA 136 134*  K 4.3 3.9  CL 88* 90*  CO2 35* 33*  GLUCOSE 120* 170*  BUN 26* 31*  CREATININE 0.89 0.86  CALCIUM 8.9 8.6*    CMET: Lab Results  Component Value Date  WBC 6.7 05/24/2020   HGB 9.0 (L) 05/24/2020   HCT 29.6 (L) 05/24/2020   PLT 206 05/24/2020   GLUCOSE 170 (H) 05/24/2020   ALT 14 05/21/2020   AST 13 (L) 05/21/2020   NA 134 (L) 05/24/2020   K 3.9 05/24/2020   CL 90 (L) 05/24/2020   CREATININE 0.86 05/24/2020   BUN 31 (H) 05/24/2020   CO2 33 (H) 05/24/2020   INR 1.0 05/11/2020   HGBA1C 10.7 (H) 05/08/2020      PT/INR: No results for input(s): LABPROT, INR in the last 72 hours. Radiology: [facility] Chest Port 1 View  Result Date: 05/24/2020 CLINICAL DATA:  Angina for abnormal respirations. EXAM: PORTABLE CHEST 1 VIEW COMPARISON:  Chest x-ray 05/23/2020.  CT 05/23/2020, 05/22/2020. FINDINGS: Interim placement of pigtail chest tube, its tip is over the right mid chest. Superior most standard chest tube side hole is again noted in the  chest wall soft tissues in unchanged position. Inferior-most standard chest tube in stable position over the right lower chest. Known pneumothoraces are difficult to visualized. Reference is made to chest CT report of 05/23/2020 and 05/22/2020. Extensive bilateral neck and chest wall subcutaneous emphysema is again noted. Persistent right upper lung consolidation and bilateral interstitial prominence again noted. Heart size stable. IMPRESSION: 1. Interim placement of pigtail chest tube, its tip is over the right mid chest. 2. Superior most standard right chest tube side hole again noted in the chest wall soft tissues. Inferior-most standard right chest tube in stable position. Known pneumothoraces poorly visualized by chest x-ray, reference is made to prior CT reports of 05/23/2020 and 05/22/2020. Extensive bilateral neck and chest wall subcutaneous emphysema again noted. 3. Persistent right upper lung consolidation and bilateral interstitial changes again noted. Electronically Signed   By: Maisie Fus  Register   On: 05/24/2020 06:52   CT IMAGE GUIDED DRAINAGE BY PERCUTANEOUS CATHETER  Result Date: 05/23/2020 INDICATION: 59 year old female with massive subcutaneous emphysema and complicated right pneumothorax. Two chest tubes were placed surgically yesterday. Patient has a persistent anterior pneumothorax component and presents for placement of an image guided thoracostomy tube into this location. EXAM: Chest tube placement with CT guidance MEDICATIONS: The patient is currently admitted to the hospital and receiving intravenous antibiotics. The antibiotics were administered within an appropriate time frame prior to the initiation of the procedure. ANESTHESIA/SEDATION: Fentanyl 50 mcg IV; Versed 1 mg IV Moderate Sedation Time:  23 minutes The patient was continuously monitored during the procedure by the interventional radiology nurse under my direct supervision. COMPLICATIONS: None immediate. PROCEDURE: Informed  written consent was obtained from the patient after a thorough discussion of the procedural risks, benefits and alternatives. All questions were addressed. Maximal Sterile Barrier Technique was utilized including caps, mask, sterile gowns, sterile gloves, sterile drape, hand hygiene and skin antiseptic. A timeout was performed prior to the initiation of the procedure. A planning axial CT scan was performed. The anterior pneumothorax was successfully identified. A suitable skin entry site was selected and marked. The skin was sterilely prepped and draped in standard fashion using chlorhexidine skin prep. Local anesthesia was attained by infiltration with 1% lidocaine. A small dermatotomy was made. Under intermittent CT guidance, an 18 gauge trocar needle was advanced over a rib and into the pleural space in the region of the pneumothorax. A 0.035 wire was then coiled in the pleural space. The subcutaneous tract was dilated to 10 [place]. A Cook 10 [place] all-purpose drainage catheter was advanced over the wire and formed. The catheter  was connected to low wall suction via a pleur-evac. The catheter was then secured to the skin with 0 Prolene suture and bandages were applied. Post placement CT imaging demonstrates a well-positioned chest tube with decreased anterior pneumothorax. A small amount of pneumothorax persists. IMPRESSION: Successful placement of 10 French pigtail thoracostomy tube into the loculated anterior pneumothorax. Electronically Signed   By: Malachy Moan M.D.   On: 05/23/2020 16:27     Assessment/Plan: S/P Procedure(s) (LRB): RIGHT CHEST WALL WOUND CLOSURE REMOVAL OF WOUND VAC (Right)  -POD12 right VATS / mechanical pleurodesis with evacuation of a large chest wall hematoma post chest tube placement at OSH   -POD7 closure of right chest wall wound after treatment with a wound vac x 5 days. Chest tubes removed 12/6 after several days of minimal drainage and no air leak. She developed  extensive subQ air to the chest and neck about 24 hours later and had a chest tube placed at the bedside. CT position was sub-optimal and there was no significant  improvement in the subQ air. A second chest tube was placed at the bedside on 12/8 but despite this, the subQ air has progressed into her face and right eyelid.  A CT-guided 10Fr right chest tube was placed by IR on 05/23/20. I see appropriate tidaling of air in the tube but no air leak. No definite PTX on the CXR but definition of the anatomy is difficult to discern due to extensive subQ air.  The 20Fr tube was removed this morning since the proximal port was outside the chest wall. An occlusive dressing was applied.   Plan to leave the remaining two pleural tubes to -20cmH2O suction.  CXR in AM.   Leary Roca, PA-C [phone] 05/24/2020 9:57 AM

## 2020-05-24 NOTE — Progress Notes (Addendum)
PROGRESS NOTE    Annette [name]  ZOX:[id number] DOB: 1961/02/05 DOA: 05/07/2020 PCP: Patient, No Pcp Per  Brief Narrative: 59 year old female with history of COPD, type 2 diabetes mellitus, obesity, hypertension and dyslipidemia was admitted to [facility] on 11/19 with MRSA right upper lobe pneumonia, right lower lobe subsegmental pulmonary embolism she was treated with IV antibiotics and Lovenox for anticoagulation, subsequently developed right-sided hydropneumothorax on 11/22 underwent chest tube insertion and was transferred to [facility] for T CTS evaluation. -Upon arrival here, CT chest noted moderately large right pneumothorax, extensive subcutaneous emphysema throughout the right chest wall extending to the neck, had a chest tube placed on 11/24 -Then complicated by hemothorax, acute blood loss anemia, on 11/28 underwent evacuation of hematoma, mechanical pleurodesis and wound VAC placement -Subsequently on 12/3 underwent right chest wall wound closure and removal of wound VAC -Chest tubes removed on 12/ 6 after several days of minimal drainage without air leak -On 12/7 developed acute worsening severe subcutaneous emphysema extending to the neck and face, had another chest tube placed on the right by T CTS -Had persistent right pneumothorax and finally underwent another right chest tube placement in IR on 12/9 -Also seen and followed by palliative care this admission -Transferred from PCCM to [facility] service today 12/10 -Right superior chest tube was removed to the proximal port was outside the chest wall  Assessment & Plan:   Complicated pneumothorax Extensive subcutaneous emphysema MRSA pneumonia -Completed antibiotic therapy -Initial chest tube placed at outside hospital on 11/24 followed by repeat chest tube placement on 11/28, both chest tubes were removed on 12/6 with acute worsening subcutaneous emphysema and pneumothorax -Repeat chest tube placement on  12/7, and finally in IR on 12/9 -Superior chest tube which was dislodged and potentially contributing to subcutaneous emphysema is removed today 12/10 -The other 2 chest tubes have been placed to suction -Prior T CTS -On 8 L O2 at this time  Recent subsegmental pulmonary embolism -Was initially on Lovenox, then developed acute blood loss anemia and massive chest wall hematoma which required evacuation -Echocardiogram did not note any right heart strain -Anticoagulation currently on hold, still with bloody output from chest tube -Restart with Lovenox or heparin when appropriate per T CTS  COPD  Type 2 diabetes mellitus -CBGs poorly controlled, increase Lantus, continue sliding scale insulin, may need additional meal coverage, monitor  Obesity -BMI 33.5 Feraheme  DVT prophylaxis: SCDs Code Status: Full code Family Communication: No family at bedside, will update daughter Disposition Plan:  Status is: Inpatient  Remains inpatient appropriate because:Inpatient level of care appropriate due to severity of illness   Dispo: The patient is from: Home              Anticipated d/c is to: Home              Anticipated d/c date is: > 3 days              Patient currently is not medically stable to d/c.  Consultants:   See CTS   Procedures: Multiple procedures as outlined my note  Antimicrobials:    Subjective: -Complains of pain all over, especially at the site where she has 3 chest tubes  Objective: Vitals:   05/24/20 0011 05/24/20 0400 05/24/20 0430 05/24/20 0804  BP: 122/60 129/62  113/62  Pulse: 94 92    Resp: 16 17    Temp:      TempSrc:    Oral  SpO2: 96% 92%  Weight:   100.2 kg   Height:        Intake/Output Summary (Last 24 hours) at 05/24/2020 1103 Last data filed at 05/24/2020 0700 Gross per 24 hour  Intake 60 ml  Output 892 ml  Net -832 ml   Filed Weights   05/22/20 0431 05/23/20 0326 05/24/20 0430  Weight: 100.1 kg 99.1 kg 100.2 kg     Examination:  General exam: Obese female appears older than stated age, uncomfortable appearing, considerable subcutaneous emphysema involving her face and eyelids HEENT: Neck obese with subcutaneous emphysema, unable to assess JVD CVS: S1-S2, distant heart sounds Lungs: Subcutaneous emphysema across chest wall bilaterally, 3 chest tubes to suction, poor air movement bilaterally Abdomen: Obese, soft, nontender, bowel sounds present Extremities: No edema Psychiatry:  Mood & affect appropriate.     Data Reviewed:   CBC: Recent Labs  Lab 05/20/20 0246 05/21/20 0045 05/22/20 0721 05/23/20 0310 05/24/20 0500  WBC 3.8* 3.9* 5.2 5.1 6.7  NEUTROABS 2.1 2.4 3.2 2.9 4.6  HGB 8.0* 8.9* 9.3* 9.7* 9.0*  HCT 26.7* 28.0* 29.9* 31.2* 29.6*  MCV 96.0 92.7 93.4 93.4 94.9  PLT 97* 100* 144* 158 206   Basic Metabolic Panel: Recent Labs  Lab 05/19/20 0159 05/20/20 0246 05/21/20 0045 05/21/20 1858 05/22/20 0721 05/23/20 0310 05/23/20 1259 05/24/20 0500  NA 135 138 137  --   --   --  136 134*  K 4.6 4.7 3.9  --   --   --  4.3 3.9  CL 86* 89* 90*  --   --   --  88* 90*  CO2 42* 41* 37*  --   --   --  35* 33*  GLUCOSE 302* 185* 198* 422*  --   --  120* 170*  BUN 15 15 15   --   --   --  26* 31*  CREATININE 0.64 0.77 0.67  --   --   --  0.89 0.86  CALCIUM 8.3* 8.4* 8.8*  --   --   --  8.9 8.6*  MG 1.7 1.9 1.9  --  1.8 1.9  --  2.0  PHOS 4.2 4.2 3.9  --  4.5 5.6*  --  3.8   GFR: Estimated Creatinine Clearance: 86.1 mL/min (by C-G formula based on SCr of 0.86 mg/dL). Liver Function Tests: Recent Labs  Lab 05/18/20 0236 05/19/20 0159 05/20/20 0246 05/21/20 0045  AST 19 19 17  13*  ALT 14 15 15 14   ALKPHOS 60 58 58 60  BILITOT 0.6 0.8 0.8 0.8  PROT 5.0* 4.7* 4.7* 5.2*  ALBUMIN 1.7* 1.7* 1.8* 1.9*   No results for input(s): LIPASE, AMYLASE in the last 168 hours. No results for input(s): AMMONIA in the last 168 hours. Coagulation Profile: No results for input(s): INR,  PROTIME in the last 168 hours. Cardiac Enzymes: No results for input(s): CKTOTAL, CKMB, CKMBINDEX, TROPONINI in the last 168 hours. BNP (last 3 results) No results for input(s): PROBNP in the last 8760 hours. HbA1C: No results for input(s): HGBA1C in the last 72 hours. CBG: Recent Labs  Lab 05/23/20 1529 05/23/20 1941 05/23/20 2359 05/24/20 0418 05/24/20 0802  GLUCAP 105* 263* 290* 228* 274*   Lipid Profile: No results for input(s): CHOL, HDL, LDLCALC, TRIG, CHOLHDL, LDLDIRECT in the last 72 hours. Thyroid Function Tests: No results for input(s): TSH, T4TOTAL, FREET4, T3FREE, THYROIDAB in the last 72 hours. Anemia Panel: No results for input(s): VITAMINB12, FOLATE, FERRITIN, TIBC, IRON, RETICCTPCT in the last 72  hours. Urine analysis: No results found for: COLORURINE, APPEARANCEUR, LABSPEC, PHURINE, GLUCOSEU, HGBUR, BILIRUBINUR, KETONESUR, PROTEINUR, UROBILINOGEN, NITRITE, LEUKOCYTESUR Sepsis Labs: @LABRCNTIP (procalcitonin:4,lacticidven:4)  ) Recent Results (from the past 240 hour(s))  SARS Coronavirus 2 by RT PCR (hospital order, performed in [facility] hospital lab) Nasopharyngeal Nasopharyngeal Swab     Status: None   Collection Time: 05/20/20  3:51 PM   Specimen: Nasopharyngeal Swab  Result Value Ref Range Status   SARS Coronavirus 2 NEGATIVE NEGATIVE Final    Comment: (NOTE) SARS-CoV-2 target nucleic acids are NOT DETECTED.  The SARS-CoV-2 RNA is generally detectable in upper and lower respiratory specimens during the acute phase of infection. The lowest concentration of SARS-CoV-2 viral copies this assay can detect is 250 copies / mL. A negative result does not preclude SARS-CoV-2 infection and should not be used as the sole basis for treatment or other patient management decisions.  A negative result may occur with improper specimen collection / handling, submission of specimen other than nasopharyngeal swab, presence of viral mutation(s) within the areas  targeted by this assay, and inadequate number of viral copies (<250 copies / mL). A negative result must be combined with clinical observations, patient history, and epidemiological information.  Fact Sheet for Patients:   14/06/21  Fact Sheet for Healthcare Providers: BoilerBrush.com.cy  This test is not yet approved or  cleared by the https://pope.com/ FDA and has been authorized for detection and/or diagnosis of SARS-CoV-2 by FDA under an Emergency Use Authorization (EUA).  This EUA will remain in effect (meaning this test can be used) for the duration of the COVID-19 declaration under Section 564(b)(1) of the Act, 21 U.S.C. section 360bbb-3(b)(1), unless the authorization is terminated or revoked sooner.  Performed at [facility], [address], [place], [place]          Radiology Studies: DG Chest 1 View  Result Date: 05/22/2020 CLINICAL DATA:  Pneumothorax on right. Additional history provided: VATS/thoracotomy 05/12/2020. EXAM: CHEST  1 VIEW COMPARISON:  Chest radiograph performed earlier today at 5:19 a.m. FINDINGS: Apparent slight interval retraction of the more superiorly located right-sided chest tube with the side port just external to the chest wall. Interval placement of an additional chest tube projecting in the region of the right lung base. Interval progression of opacity at the right lung base which may reflect atelectasis. Redemonstrated extensive airspace disease throughout the remainder of the right lung. No appreciable right pneumothorax. Unchanged small left pneumothorax. Heart size within normal limits. Redemonstrated extensive subcutaneous emphysema. Impression 1 below will be called to the ordering clinician or representative by the Radiologist Assistant, and communication documented in the PACS or 05/14/2020. IMPRESSION: Slight interval retraction of the more superiorly located right  chest tube, now with the side port positioned just external to the chest wall. Interval placement of a second chest tube projecting in the region of the right lung base. No appreciable right pneumothorax Interval progression of opacity at the right lung base, which may reflect atelectasis. Redemonstrated extensive airspace disease throughout the remainder of the right lung. Unchanged small left pneumothorax. Redemonstrated extensive subcutaneous emphysema. Electronically Signed   By: [employer] DO   On: 05/22/2020 12:21   CT CHEST WO CONTRAST  Result Date: 05/22/2020 CLINICAL DATA:  Pneumothorax status post fat EXAM: CT CHEST WITHOUT CONTRAST TECHNIQUE: Multidetector CT imaging of the chest was performed following the standard protocol without IV contrast. COMPARISON:  CT dated May 10, 2020 FINDINGS: Cardiovascular: The heart size is  stable. Minimal aortic calcifications are noted. There is no significant pericardial effusion. Mediastinum/Nodes: -- No mediastinal lymphadenopathy. -- No hilar lymphadenopathy. -- No axillary lymphadenopathy. -- No supraclavicular lymphadenopathy. -- Normal thyroid gland where visualized. -  Unremarkable esophagus. Lungs/Pleura: There is a moderate-sized right-sided hydropneumothorax. The air component has decreased from the prior study. There is a small left-sided pneumothorax. Extensive pneumomediastinum is noted. There is extensive consolidation involving the right upper lobe. There is moderate consolidation involving the right middle lobe. There is complete obstruction of the right lower lobe bronchus with extensive consolidation involving the right lower lobe. There is a small complex right-sided pleural effusion containing blood products. The superior most chest tube is barely within the thoracic cavity. The side hole is within the subcutaneous soft tissues of the right flank. Surrounding the superior right test tube is a right-sided hematoma which has decreased in  size from the prior study. There is a new inferiorly oriented right-sided chest tube that is well positioned within the thoracic cavity. Extensive subcutaneous gas is noted. This has substantially worsened since the prior CT in November. There is a new defect in the chest wall between the fourth and fifth ribs measuring approximately 4.3 cm (axial series 3, image 77). There is a collection of air extending into the right breast tissue arising from this defect. Upper Abdomen: No acute abnormality is noted in the upper abdomen. Again noted is a simple appearing hepatic cyst. Musculoskeletal: No chest wall abnormality. No bony spinal canal stenosis. IMPRESSION: 1. As described on previous chest x-rays, the superior most right-sided chest tube is suboptimally positioned. The tube is barely within the thoracic cavity and the side hole is within the soft tissues of the right flank. This may be contributing to the patient's extensive subcutaneous emphysema. 2. New large defect in the thoracic cavity between the fourth and fifth ribs anterolaterally on the right. This too is likely contributing to the patient's extensive subcutaneous emphysema. 3. Moderate right-sided pneumothorax, improved from prior study. New small left-sided pneumothorax. Extensive pneumomediastinum. 4. Small right-sided hemothorax. 5. New well-positioned right-sided chest tube terminating in the lower right thoracic cavity. 6. There is extensive consolidation throughout the right lung with complete opacification of the right lower lobe bronchus. Coarse bilateral airspace opacities are noted. Aortic Atherosclerosis (ICD10-I70.0). These results will be called to the ordering clinician or representative by the Radiologist Assistant, and communication documented in the PACS or [employer]. Electronically Signed   By: Katherine Mantle M.D.   On: 05/22/2020 19:18   DG Chest Port 1 View  Result Date: 05/24/2020 CLINICAL DATA:  Angina for abnormal  respirations. EXAM: PORTABLE CHEST 1 VIEW COMPARISON:  Chest x-ray 05/23/2020.  CT 05/23/2020, 05/22/2020. FINDINGS: Interim placement of pigtail chest tube, its tip is over the right mid chest. Superior most standard chest tube side hole is again noted in the chest wall soft tissues in unchanged position. Inferior-most standard chest tube in stable position over the right lower chest. Known pneumothoraces are difficult to visualized. Reference is made to chest CT report of 05/23/2020 and 05/22/2020. Extensive bilateral neck and chest wall subcutaneous emphysema is again noted. Persistent right upper lung consolidation and bilateral interstitial prominence again noted. Heart size stable. IMPRESSION: 1. Interim placement of pigtail chest tube, its tip is over the right mid chest. 2. Superior most standard right chest tube side hole again noted in the chest wall soft tissues. Inferior-most standard right chest tube in stable position. Known pneumothoraces poorly visualized by chest x-ray, reference  is made to prior CT reports of 05/23/2020 and 05/22/2020. Extensive bilateral neck and chest wall subcutaneous emphysema again noted. 3. Persistent right upper lung consolidation and bilateral interstitial changes again noted. Electronically Signed   By: Maisie Fus  Register   On: 05/24/2020 06:52   DG CHEST PORT 1 VIEW  Result Date: 05/23/2020 CLINICAL DATA:  Chest tube.  Shortness of breath. EXAM: PORTABLE CHEST 1 VIEW COMPARISON:  CT 05/22/2020.  Chest x-ray 05/22/2020. FINDINGS: Superior right chest tube side hole again noted in the soft tissues of the right chest wall in unchanged position. Inferior right chest tube in stable position. Persistent right upper lung consolidation. Persistent bilateral atelectatic changes and interstitial changes. Heart size stable. Extensive diffuse bilateral neck and chest wall subcutaneous emphysema again noted without interim change. Reference is made to prior CT report. Known  bilateral pneumothoraces poorly visualized. IMPRESSION: 1. Superior right chest tube side hole again noted in the soft tissues of the right chest wall in unchanged position. Inferior right chest tube in stable position. Known bilateral pneumothoraces poorly visualized. Extensive diffuse bilateral neck and chest wall subcutaneous emphysema again noted without interim change. Reference is made to chest CT report of 05/22/2020. 2. Persistent right upper lung consolidation. Persistent bilateral atelectatic changes and interstitial changes. Electronically Signed   By: Maisie Fus  Register   On: 05/23/2020 07:19   CT IMAGE GUIDED DRAINAGE BY PERCUTANEOUS CATHETER  Result Date: 05/23/2020 INDICATION: 58 year old female with massive subcutaneous emphysema and complicated right pneumothorax. Two chest tubes were placed surgically yesterday. Patient has a persistent anterior pneumothorax component and presents for placement of an image guided thoracostomy tube into this location. EXAM: Chest tube placement with CT guidance MEDICATIONS: The patient is currently admitted to the hospital and receiving intravenous antibiotics. The antibiotics were administered within an appropriate time frame prior to the initiation of the procedure. ANESTHESIA/SEDATION: Fentanyl 50 mcg IV; Versed 1 mg IV Moderate Sedation Time:  23 minutes The patient was continuously monitored during the procedure by the interventional radiology nurse under my direct supervision. COMPLICATIONS: None immediate. PROCEDURE: Informed written consent was obtained from the patient after a thorough discussion of the procedural risks, benefits and alternatives. All questions were addressed. Maximal Sterile Barrier Technique was utilized including caps, mask, sterile gowns, sterile gloves, sterile drape, hand hygiene and skin antiseptic. A timeout was performed prior to the initiation of the procedure. A planning axial CT scan was performed. The anterior pneumothorax  was successfully identified. A suitable skin entry site was selected and marked. The skin was sterilely prepped and draped in standard fashion using chlorhexidine skin prep. Local anesthesia was attained by infiltration with 1% lidocaine. A small dermatotomy was made. Under intermittent CT guidance, an 18 gauge trocar needle was advanced over a rib and into the pleural space in the region of the pneumothorax. A 0.035 wire was then coiled in the pleural space. The subcutaneous tract was dilated to 10 [place]. A Cook 10 [place] all-purpose drainage catheter was advanced over the wire and formed. The catheter was connected to low wall suction via a pleur-evac. The catheter was then secured to the skin with 0 Prolene suture and bandages were applied. Post placement CT imaging demonstrates a well-positioned chest tube with decreased anterior pneumothorax. A small amount of pneumothorax persists. IMPRESSION: Successful placement of 10 French pigtail thoracostomy tube into the loculated anterior pneumothorax. Electronically Signed   By: Malachy Moan M.D.   On: 05/23/2020 16:27        Scheduled Meds: .  atorvastatin  40 mg Oral Daily  . bisacodyl  10 mg Oral Daily  . Chlorhexidine Gluconate Cloth  6 each Topical Daily  . cholecalciferol  1,000 Units Oral Daily  . clonazePAM  0.5 mg Oral BID  . fenofibrate  160 mg Oral Daily  . furosemide  40 mg Intravenous BID  . Gerhardt's butt cream   Topical BID  . guaiFENesin  600 mg Oral BID  . HYDROmorphone      . insulin aspart  0-15 Units Subcutaneous Q4H  . insulin glargine  15 Units Subcutaneous QHS  . lidocaine  1 patch Transdermal Q24H  . multivitamin with minerals  1 tablet Oral Daily  . nystatin   Topical BID  . pregabalin  150 mg Oral TID  . senna-docusate  1 tablet Oral QHS  . sodium chloride flush  10-40 mL Intracatheter Q12H  . umeclidinium bromide  1 puff Inhalation Daily   Continuous Infusions:   LOS: 17 days    Time spent:  35min  Zannie CovePreetha Aditri Louischarles, MD [facility]  05/24/2020, 11:03 AM

## 2020-05-24 NOTE — Progress Notes (Signed)
Referring Physician(s): Dr. Tyrone Sage  Supervising Physician: Ruel Favors  Patient Status:  [facility] - In-pt  Chief Complaint: Subcutaneous emphysema  Subjective: Resting comfortably. Left and right eyes are both swollen shut today. Chest and neck appears stable.  Reports 1 chest tube was removed this AM already.  IR and 1 TCTS chest tube remain in place.   Allergies: Cymbalta [duloxetine hcl]  Medications: Prior to Admission medications   Medication Sig Start Date End Date Taking? Authorizing Provider  atorvastatin (LIPITOR) 40 MG tablet Take 40 mg by mouth daily. 03/14/20  Yes [provider]  cholecalciferol (VITAMIN D3) 25 MCG (1000 UNIT) tablet Take 1,000 Units by mouth daily.   Yes [provider]  fenofibrate 160 MG tablet Take 160 mg by mouth daily. 02/15/20  Yes [provider]  glipiZIDE (GLUCOTROL) 10 MG tablet Take 10 mg by mouth 2 (two) times daily. 12/20/19  Yes [provider]  montelukast (SINGULAIR) 10 MG tablet Take 10 mg by mouth daily. 03/14/20  Yes [provider]  omeprazole (PRILOSEC) 40 MG capsule Take 40 mg by mouth 2 (two) times daily. 02/18/20  Yes [provider]  apixaban (ELIQUIS) 5 MG TABS tablet Take 2 tablets (10mg ) twice daily for 7 days, then 1 tablet (5mg ) twice daily 05/21/20   , MD  clonazePAM (KLONOPIN) 0.5 MG tablet Take 1 tablet (0.5 mg total) by mouth 2 (two) times daily. 05/21/20   Rhetta Mura, MD  furosemide (LASIX) 40 MG tablet Take 1 tablet (40 mg total) by mouth daily. 05/21/20 05/21/21  14/7/21, MD  insulin glargine (LANTUS) 100 UNIT/ML injection Inject 0.15 mLs (15 Units total) into the skin at bedtime. 05/21/20   Rhetta Mura, MD  nystatin (MYCOSTATIN/NYSTOP) powder Apply topically 2 (two) times daily. 05/21/20   Rhetta Mura, MD  oxyCODONE (OXY IR/ROXICODONE) 5 MG immediate release tablet Take 1-2 tablets (5-10 mg total) by mouth every  4 (four) hours as needed for moderate pain. 05/21/20   Rhetta Mura, MD  polyethylene glycol (MIRALAX / GLYCOLAX) 17 g packet Take 17 g by mouth 2 (two) times daily as needed for mild constipation. 05/21/20   Rhetta Mura, MD  pregabalin (LYRICA) 150 MG capsule Take 1 capsule (150 mg total) by mouth in the morning, at noon, and at bedtime. 05/21/20   Rhetta Mura, MD  senna-docusate (SENOKOT-S) 8.6-50 MG tablet Take 1 tablet by mouth at bedtime. 05/21/20   09-21-1979, MD  umeclidinium bromide (INCRUSE ELLIPTA) 62.5 MCG/INH AEPB Inhale 1 puff into the lungs daily. 05/22/20   Rhetta Mura, MD     Vital Signs: BP 113/62 (BP Location: Right Arm)   Pulse 92   Temp 98 F (36.7 C) (Oral)   Resp 17   Ht 5\' 8"  (1.727 m)   Wt 220 lb 14.4 oz (100.2 kg)   SpO2 92%   BMI 33.59 kg/m   Physical Exam  NAD, alert Significant subcutaneous emphysema to the torso, chest, neck, and face. Large bore chest tube in place in inferior right chest. Anterior pigtail IR chest tube in place.  No air leak from either tube.  Imaging: DG Chest 1 View  Result Date: 05/22/2020 CLINICAL DATA:  Pneumothorax on right. Additional history provided: VATS/thoracotomy 05/12/2020. EXAM: CHEST  1 VIEW COMPARISON:  Chest radiograph performed earlier today at 5:19 a.m. FINDINGS: Apparent slight interval retraction of the more superiorly located right-sided chest tube with the side port just external to the chest wall. Interval placement of an  additional chest tube projecting in the region of the right lung base. Interval progression of opacity at the right lung base which may reflect atelectasis. Redemonstrated extensive airspace disease throughout the remainder of the right lung. No appreciable right pneumothorax. Unchanged small left pneumothorax. Heart size within normal limits. Redemonstrated extensive subcutaneous emphysema. Impression 1 below will be called to the ordering clinician or  representative by the Radiologist Assistant, and communication documented in the PACS or [employer]. IMPRESSION: Slight interval retraction of the more superiorly located right chest tube, now with the side port positioned just external to the chest wall. Interval placement of a second chest tube projecting in the region of the right lung base. No appreciable right pneumothorax Interval progression of opacity at the right lung base, which may reflect atelectasis. Redemonstrated extensive airspace disease throughout the remainder of the right lung. Unchanged small left pneumothorax. Redemonstrated extensive subcutaneous emphysema. Electronically Signed   By: Jackey Loge DO   On: 05/22/2020 12:21   DG Chest 2 View  Result Date: 05/21/2020 CLINICAL DATA:  Chest pain, shortness of breath. EXAM: CHEST - 2 VIEW COMPARISON:  May 20, 2020. FINDINGS: The heart size and mediastinal contours are within normal limits. No pneumothorax is noted. Stable right lung opacity is noted, most prominently the right upper lobe, most consistent with pneumonia. Small right pleural effusion may be present. Stable mild left basilar atelectasis or infiltrate is noted. The visualized skeletal structures are unremarkable. IMPRESSION: Stable right upper lobe pneumonia. Small right pleural effusion may be present. Stable mild left basilar atelectasis or infiltrate is noted. Electronically Signed   By: Lupita Raider M.D.   On: 05/21/2020 08:02   CT CHEST WO CONTRAST  Result Date: 05/22/2020 CLINICAL DATA:  Pneumothorax status post fat EXAM: CT CHEST WITHOUT CONTRAST TECHNIQUE: Multidetector CT imaging of the chest was performed following the standard protocol without IV contrast. COMPARISON:  CT dated May 10, 2020 FINDINGS: Cardiovascular: The heart size is stable. Minimal aortic calcifications are noted. There is no significant pericardial effusion. Mediastinum/Nodes: -- No mediastinal lymphadenopathy. -- No hilar  lymphadenopathy. -- No axillary lymphadenopathy. -- No supraclavicular lymphadenopathy. -- Normal thyroid gland where visualized. -  Unremarkable esophagus. Lungs/Pleura: There is a moderate-sized right-sided hydropneumothorax. The air component has decreased from the prior study. There is a small left-sided pneumothorax. Extensive pneumomediastinum is noted. There is extensive consolidation involving the right upper lobe. There is moderate consolidation involving the right middle lobe. There is complete obstruction of the right lower lobe bronchus with extensive consolidation involving the right lower lobe. There is a small complex right-sided pleural effusion containing blood products. The superior most chest tube is barely within the thoracic cavity. The side hole is within the subcutaneous soft tissues of the right flank. Surrounding the superior right test tube is a right-sided hematoma which has decreased in size from the prior study. There is a new inferiorly oriented right-sided chest tube that is well positioned within the thoracic cavity. Extensive subcutaneous gas is noted. This has substantially worsened since the prior CT in November. There is a new defect in the chest wall between the fourth and fifth ribs measuring approximately 4.3 cm (axial series 3, image 77). There is a collection of air extending into the right breast tissue arising from this defect. Upper Abdomen: No acute abnormality is noted in the upper abdomen. Again noted is a simple appearing hepatic cyst. Musculoskeletal: No chest wall abnormality. No bony spinal canal stenosis. IMPRESSION: 1. As described on  previous chest x-rays, the superior most right-sided chest tube is suboptimally positioned. The tube is barely within the thoracic cavity and the side hole is within the soft tissues of the right flank. This may be contributing to the patient's extensive subcutaneous emphysema. 2. New large defect in the thoracic cavity between the  fourth and fifth ribs anterolaterally on the right. This too is likely contributing to the patient's extensive subcutaneous emphysema. 3. Moderate right-sided pneumothorax, improved from prior study. New small left-sided pneumothorax. Extensive pneumomediastinum. 4. Small right-sided hemothorax. 5. New well-positioned right-sided chest tube terminating in the lower right thoracic cavity. 6. There is extensive consolidation throughout the right lung with complete opacification of the right lower lobe bronchus. Coarse bilateral airspace opacities are noted. Aortic Atherosclerosis (ICD10-I70.0). These results will be called to the ordering clinician or representative by the Radiologist Assistant, and communication documented in the PACS or [employer]. Electronically Signed   By: Katherine Mantle M.D.   On: 05/22/2020 19:18   DG Chest Port 1 View  Result Date: 05/24/2020 CLINICAL DATA:  Angina for abnormal respirations. EXAM: PORTABLE CHEST 1 VIEW COMPARISON:  Chest x-ray 05/23/2020.  CT 05/23/2020, 05/22/2020. FINDINGS: Interim placement of pigtail chest tube, its tip is over the right mid chest. Superior most standard chest tube side hole is again noted in the chest wall soft tissues in unchanged position. Inferior-most standard chest tube in stable position over the right lower chest. Known pneumothoraces are difficult to visualized. Reference is made to chest CT report of 05/23/2020 and 05/22/2020. Extensive bilateral neck and chest wall subcutaneous emphysema is again noted. Persistent right upper lung consolidation and bilateral interstitial prominence again noted. Heart size stable. IMPRESSION: 1. Interim placement of pigtail chest tube, its tip is over the right mid chest. 2. Superior most standard right chest tube side hole again noted in the chest wall soft tissues. Inferior-most standard right chest tube in stable position. Known pneumothoraces poorly visualized by chest x-ray, reference is made  to prior CT reports of 05/23/2020 and 05/22/2020. Extensive bilateral neck and chest wall subcutaneous emphysema again noted. 3. Persistent right upper lung consolidation and bilateral interstitial changes again noted. Electronically Signed   By: Maisie Fus  Register   On: 05/24/2020 06:52   DG CHEST PORT 1 VIEW  Result Date: 05/23/2020 CLINICAL DATA:  Chest tube.  Shortness of breath. EXAM: PORTABLE CHEST 1 VIEW COMPARISON:  CT 05/22/2020.  Chest x-ray 05/22/2020. FINDINGS: Superior right chest tube side hole again noted in the soft tissues of the right chest wall in unchanged position. Inferior right chest tube in stable position. Persistent right upper lung consolidation. Persistent bilateral atelectatic changes and interstitial changes. Heart size stable. Extensive diffuse bilateral neck and chest wall subcutaneous emphysema again noted without interim change. Reference is made to prior CT report. Known bilateral pneumothoraces poorly visualized. IMPRESSION: 1. Superior right chest tube side hole again noted in the soft tissues of the right chest wall in unchanged position. Inferior right chest tube in stable position. Known bilateral pneumothoraces poorly visualized. Extensive diffuse bilateral neck and chest wall subcutaneous emphysema again noted without interim change. Reference is made to chest CT report of 05/22/2020. 2. Persistent right upper lung consolidation. Persistent bilateral atelectatic changes and interstitial changes. Electronically Signed   By: Maisie Fus  Register   On: 05/23/2020 07:19   DG Chest Port 1 View  Result Date: 05/22/2020 CLINICAL DATA:  Chest tube, status post pneumothorax. Shortness of breath. EXAM: PORTABLE CHEST 1 VIEW COMPARISON:  05/21/2020 FINDINGS:  Right chest tube remains in stable position with the side port near the chest wall. Extensive subcutaneous emphysema again noted. Small left pneumothorax is stable. No visible right pneumothorax. Extensive airspace disease  throughout the right lung, unchanged. Heart is normal size. IMPRESSION: Stable extensive subcutaneous emphysema with the right chest tube remaining in stable position. The side port remains at/near the chest wall. Small left pneumothorax, stable. Stable right lung airspace disease. Electronically Signed   By: Charlett Nose M.D.   On: 05/22/2020 07:39   DG CHEST PORT 1 VIEW  Result Date: 05/21/2020 CLINICAL DATA:  Shortness of breath EXAM: PORTABLE CHEST 1 VIEW COMPARISON:  X-ray earlier in the same day FINDINGS: Extensive subcutaneous edema is again noted. This is substantially worsened since the prior study. Diffuse right mid lung field airspace consolidation is noted. The right-sided chest tube is poorly position with the side hole straddling the right chest wall. There is a small to moderate-sized left-sided pneumothorax. The heart size is stable. Aortic calcifications are noted. IMPRESSION: 1. Significant interval worsening of subcutaneous emphysema. 2. Suboptimal right-sided chest tube positioning with the main side hole likely straddling the chest wall. 3. Small to moderate-sized left-sided pneumothorax, new from prior study. A right-sided pneumothorax cannot be excluded secondary to extensive subcutaneous emphysema. 4. Persistent diffuse airspace opacities throughout the right lung field, relatively similar to prior study. These results will be called to the ordering clinician or representative by the Radiologist Assistant, and communication documented in the PACS or [employer]. Electronically Signed   By: Katherine Mantle M.D.   On: 05/21/2020 22:47   DG Chest Port 1 View  Result Date: 05/21/2020 CLINICAL DATA:  Sudden onset right chest pain with swelling and short of breath EXAM: PORTABLE CHEST 1 VIEW COMPARISON:  05/21/2020, 05/20/2020, 05/19/2020 FINDINGS: Interval development of large amount of subcutaneous emphysema within the right chest wall and neck. Possible trace right apical  pneumothorax though difficult to appreciate given overlying subcutaneous emphysema. Probable small amount of pneumomediastinum. Dense right apical consolidation as before. Small right-sided pleural effusion. Increasing airspace opacities at the right greater than left lung base. Stable cardiomediastinal silhouette. IMPRESSION: 1. Interval development of large amount of subcutaneous emphysema within the right chest wall and neck. 2. Possible trace right apical pneumothorax though difficult to appreciate given overlying subcutaneous emphysema. Probable small amount of pneumomediastinum. 3. Persistent dense right apical lung consolidation with increasing airspace disease at the right greater than left lung bases. Probable small right effusion. Electronically Signed   By: Jasmine Pang M.D.   On: 05/21/2020 19:28   DG Chest Port 1 View  Result Date: 05/20/2020 CLINICAL DATA:  Status post chest tube removal. EXAM: PORTABLE CHEST 1 VIEW COMPARISON:  05/20/2020 at 0552 hours FINDINGS: The 3 right-sided chest tubes on the prior study have been removed. No pneumothorax is identified. Right lung airspace opacities including dense consolidation in the apex are unchanged. Mild opacities in the left mid and lower lung are also unchanged. There is likely a small right pleural effusion. IMPRESSION: 1. Interval removal of right chest tubes. No pneumothorax. 2. Unchanged bilateral airspace opacities including dense right apical consolidation. Electronically Signed   By: Sebastian Ache M.D.   On: 05/20/2020 16:22   CT IMAGE GUIDED DRAINAGE BY PERCUTANEOUS CATHETER  Result Date: 05/23/2020 INDICATION: 59 year old female with massive subcutaneous emphysema and complicated right pneumothorax. Two chest tubes were placed surgically yesterday. Patient has a persistent anterior pneumothorax component and presents for placement of an image guided thoracostomy tube  into this location. EXAM: Chest tube placement with CT guidance  MEDICATIONS: The patient is currently admitted to the hospital and receiving intravenous antibiotics. The antibiotics were administered within an appropriate time frame prior to the initiation of the procedure. ANESTHESIA/SEDATION: Fentanyl 50 mcg IV; Versed 1 mg IV Moderate Sedation Time:  23 minutes The patient was continuously monitored during the procedure by the interventional radiology nurse under my direct supervision. COMPLICATIONS: None immediate. PROCEDURE: Informed written consent was obtained from the patient after a thorough discussion of the procedural risks, benefits and alternatives. All questions were addressed. Maximal Sterile Barrier Technique was utilized including caps, mask, sterile gowns, sterile gloves, sterile drape, hand hygiene and skin antiseptic. A timeout was performed prior to the initiation of the procedure. A planning axial CT scan was performed. The anterior pneumothorax was successfully identified. A suitable skin entry site was selected and marked. The skin was sterilely prepped and draped in standard fashion using chlorhexidine skin prep. Local anesthesia was attained by infiltration with 1% lidocaine. A small dermatotomy was made. Under intermittent CT guidance, an 18 gauge trocar needle was advanced over a rib and into the pleural space in the region of the pneumothorax. A 0.035 wire was then coiled in the pleural space. The subcutaneous tract was dilated to 10 [place]. A Cook 10 [place] all-purpose drainage catheter was advanced over the wire and formed. The catheter was connected to low wall suction via a pleur-evac. The catheter was then secured to the skin with 0 Prolene suture and bandages were applied. Post placement CT imaging demonstrates a well-positioned chest tube with decreased anterior pneumothorax. A small amount of pneumothorax persists. IMPRESSION: Successful placement of 10 French pigtail thoracostomy tube into the loculated anterior pneumothorax. Electronically  Signed   By: Malachy MoanHeath  McCullough M.D.   On: 05/23/2020 16:27    Labs:  CBC: Recent Labs    05/21/20 0045 05/22/20 0721 05/23/20 0310 05/24/20 0500  WBC 3.9* 5.2 5.1 6.7  HGB 8.9* 9.3* 9.7* 9.0*  HCT 28.0* 29.9* 31.2* 29.6*  PLT 100* 144* 158 206    COAGS: Recent Labs    05/11/20 1135  INR 1.0  APTT 33    BMP: Recent Labs    05/20/20 0246 05/21/20 0045 05/21/20 1858 05/23/20 1259 05/24/20 0500  NA 138 137  --  136 134*  K 4.7 3.9  --  4.3 3.9  CL 89* 90*  --  88* 90*  CO2 41* 37*  --  35* 33*  GLUCOSE 185* 198* 422* 120* 170*  BUN 15 15  --  26* 31*  CALCIUM 8.4* 8.8*  --  8.9 8.6*  CREATININE 0.77 0.67  --  0.89 0.86  GFRNONAA >60 >60  --  >60 >60    LIVER FUNCTION TESTS: Recent Labs    05/18/20 0236 05/19/20 0159 05/20/20 0246 05/21/20 0045  BILITOT 0.6 0.8 0.8 0.8  AST 19 19 17  13*  ALT 14 15 15 14   ALKPHOS 60 58 58 60  PROT 5.0* 4.7* 4.7* 5.2*  ALBUMIN 1.7* 1.7* 1.8* 1.9*    Assessment and Plan: Subcutaneous emphysema s/p CT guided chest tube placement 12/9 by Dr. Archer AsaMcCullough Patient assessed on the floor today.  She has more subq emphysema today. Eyes are both swollen.  Appears comfortable and in good spirits after 1 chest tube removed this AM.  Bloody output from IR chest tube, no air leak.  TCTS managing.  IR available as needed.  Electronically Signed: Hoyt KochKacie Sue-Ellen Momin Misko, PA 05/24/2020,  10:48 AM   I spent a total of 15 Minutes at the the patient's bedside AND on the patient's hospital floor or unit, greater than 50% of which was counseling/coordinating care for subcutaneous emphysema.

## 2020-05-24 NOTE — Progress Notes (Signed)
Palliative Medicine Inpatient Follow Up Note  Reason for consult:  Goals of Care "patient who is noncompliant, frequent flyer, multisystem organ failure.  Evaluate DNR, discuss goals of care palliative vs hospice"  HPI:  Per intake H&P --> 59 yr WF withrecent COVID-19about 2 months ago, recent PE on Eliquis, COPD/Asthma not on oxygen, DM-2, HTN and HLD. Admitted for RUL MRSA pneumonia and RLL subsegmental PE(TTE with mod PAH but no right heart strain) treated with vancomycin, then Zyvox, and Lovenox. She developed right-sidedhydropneumothorax on 11/22 underwent chest tube insertion on 05/06/2020 and transferred to [facility] for CTS evaluation and treatment.   Palliative care was asked to get involved to further discuss goals of care.  Today's Discussion (05/24/2020): Chart reviewed.  I met with Annette [name] at bedside this afternoon.  She was noted to have just been placed on a Venturi mask by the respiratory team.  Per nursing her oxygen needs have escalated from 6 L to 8 L to now 12 L.  I spoke to Annette [name] again about the idea of intubation, I shared with her that I had reviewed her chart and it looks like she had been in agreement with intubation earlier in the week.  Annette [name] is able to share with me that if she would be able to have quality in her life after an intubation event she would wish to pursue that.  Jama maintains stating that her greatest goal is to get home to spend time with her grandchildren.  Regarding decision makers Shelah's son Annette [name] has still not brought in [facility] documentation.  Allye states that she would not want him to make decisions for her anyhow and would wish for her daughter in law Annette [name] to make decisions for her as she is a Armed forces operational officer.   Annette [name], Annette [name], myself and two witnesses were able to go to bedside to complete advance directives formally.   Patient and I spoke about her guarded health state and my  concerns that she is at highest risk for further declines. She expresses that she knows she is not doing well overall but again is hopeful to get back home. I did bring up the topic of hospice if she continues to decline. Right now she remains to be hopeful for improvements.   Objective Assessment: Vital Signs Vitals:   05/24/20 1155 05/24/20 1400  BP: 132/73   [name]:  (!) 117  Resp:  20  Temp: (!) 100.5 F (38.1 C)   SpO2:  95%    Intake/Output Summary (Last 24 hours) at 05/24/2020 1427 Last data filed at 05/24/2020 1155 Gross per 24 hour  Intake 30 ml  Output 1278 ml  Net -1248 ml   Last Weight  Most recent update: 05/24/2020  4:35 AM   Weight  100.2 kg (220 lb 14.4 oz)           SUMMARY OF RECOMMENDATIONS Limited code as it stands right now would wish for intubation, shocks, and pressors. Would not wish for CPR.   Advance Directives completed (today - 05/24/2020) --> Please note these supersede any prior advance directives  HCPOA selected is patients daughter in law Annette [name]  Living will completed  [facility] - Patient receives outpatient palliative care through "[facility]"  Time Spent: 60 Greater than 50% of the time was spent in counseling and coordination of care ______________________________________________________________________________________ [place] Team Team Cell Phone: [phone] Please utilize secure  chat with additional questions, if there is no response within 30 minutes please call the above phone number  Palliative Medicine Team providers are available by phone from 7am to 7pm daily and can be reached through the team cell phone.  Should this patient require assistance outside of these hours, please call the patient's attending physician.

## 2020-05-24 NOTE — Progress Notes (Signed)
This chaplain was present with the PMT-NP Beverly Gust, a notary and two witnesses for notarizing the Pt. Advance Directive after completing the appropriate education.  The chaplain understands the Pt. has no questions and this Advance Directive(HCPOA and Living Will) supercedes any previous Advance Directives.  The Pt. named Annette [name] as her health care agent and Dustin Folks as the person if the healthcare agent is unable or unwilling to serve as HCPOA.  The chaplain gave the Pt. the original document and 2 copies of the AD.  A copy was scanned into EMR.  This chaplain is available for F/U spiritual care as needed.

## 2020-05-24 NOTE — Progress Notes (Addendum)
Patient requiring increased supplement O2. Sats 85-90% on 12L HFNC. Respiratory called to bedside to evaluate patient. Patient placed on venturi mask. MD aware.

## 2020-05-25 ENCOUNTER — Inpatient Hospital Stay (HOSPITAL_COMMUNITY): Payer: Medicare Other

## 2020-05-25 LAB — CBC WITH DIFFERENTIAL/PLATELET
Abs Immature Granulocytes: 0.03 10*3/uL (ref 0.00–0.07)
Basophils Absolute: 0 10*3/uL (ref 0.0–0.1)
Basophils Relative: 0 %
Eosinophils Absolute: 0.3 10*3/uL (ref 0.0–0.5)
Eosinophils Relative: 4 %
HCT: 28.1 % — ABNORMAL LOW (ref 36.0–46.0)
Hemoglobin: 8.7 g/dL — ABNORMAL LOW (ref 12.0–15.0)
Immature Granulocytes: 0 %
Lymphocytes Relative: 20 %
Lymphs Abs: 1.4 10*3/uL (ref 0.7–4.0)
MCH: 29.3 pg (ref 26.0–34.0)
MCHC: 31 g/dL (ref 30.0–36.0)
MCV: 94.6 fL (ref 80.0–100.0)
Monocytes Absolute: 0.6 10*3/uL (ref 0.1–1.0)
Monocytes Relative: 8 %
Neutro Abs: 4.9 10*3/uL (ref 1.7–7.7)
Neutrophils Relative %: 68 %
Platelets: 167 10*3/uL (ref 150–400)
RBC: 2.97 MIL/uL — ABNORMAL LOW (ref 3.87–5.11)
RDW: 19.8 % — ABNORMAL HIGH (ref 11.5–15.5)
WBC: 7.2 10*3/uL (ref 4.0–10.5)
nRBC: 0 % (ref 0.0–0.2)

## 2020-05-25 LAB — GLUCOSE, CAPILLARY
Glucose-Capillary: 105 mg/dL — ABNORMAL HIGH (ref 70–99)
Glucose-Capillary: 154 mg/dL — ABNORMAL HIGH (ref 70–99)
Glucose-Capillary: 195 mg/dL — ABNORMAL HIGH (ref 70–99)
Glucose-Capillary: 195 mg/dL — ABNORMAL HIGH (ref 70–99)
Glucose-Capillary: 211 mg/dL — ABNORMAL HIGH (ref 70–99)
Glucose-Capillary: 233 mg/dL — ABNORMAL HIGH (ref 70–99)
Glucose-Capillary: 337 mg/dL — ABNORMAL HIGH (ref 70–99)
Glucose-Capillary: 509 mg/dL (ref 70–99)

## 2020-05-25 LAB — BASIC METABOLIC PANEL
Anion gap: 8 (ref 5–15)
BUN: 31 mg/dL — ABNORMAL HIGH (ref 6–20)
CO2: 35 mmol/L — ABNORMAL HIGH (ref 22–32)
Calcium: 8.6 mg/dL — ABNORMAL LOW (ref 8.9–10.3)
Chloride: 92 mmol/L — ABNORMAL LOW (ref 98–111)
Creatinine, Ser: 0.83 mg/dL (ref 0.44–1.00)
GFR, Estimated: >60 mL/min (ref >60)
Glucose, Bld: 133 mg/dL — ABNORMAL HIGH (ref 70–99)
Potassium: 4.1 mmol/L (ref 3.5–5.1)
Sodium: 135 mmol/L (ref 135–145)

## 2020-05-25 MED ORDER — HYDROMORPHONE HCL 1 MG/ML IJ SOLN
0.5000 mg | INTRAMUSCULAR | Status: DC | PRN
  Administered 2020-05-26 – 2020-06-01 (×30): 0.5 mg via INTRAVENOUS
  Filled 2020-05-25 (×31): qty 1

## 2020-05-25 MED ORDER — SODIUM CHLORIDE 0.9 % IV BOLUS
500.0000 mL | Freq: Once | INTRAVENOUS | Status: AC
  Administered 2020-05-25: 500 mL via INTRAVENOUS

## 2020-05-25 MED ORDER — INSULIN GLARGINE 100 UNIT/ML ~~LOC~~ SOLN
25.0000 [IU] | Freq: Every day | SUBCUTANEOUS | Status: DC
  Administered 2020-05-25 – 2020-06-09 (×16): 25 [IU] via SUBCUTANEOUS
  Filled 2020-05-25 (×18): qty 0.25

## 2020-05-25 MED ORDER — INSULIN ASPART 100 UNIT/ML ~~LOC~~ SOLN
16.0000 [IU] | Freq: Once | SUBCUTANEOUS | Status: AC
  Administered 2020-05-25: 16 [IU] via SUBCUTANEOUS

## 2020-05-25 NOTE — Plan of Care (Signed)

## 2020-05-25 NOTE — Progress Notes (Signed)
PROGRESS NOTE    Johann Gascoigne  BCW:[id number] DOB: 1960-12-06 DOA: 05/07/2020 PCP: Patient, No Pcp Per  Brief Narrative: 59 year old female with history of COPD, type 2 diabetes mellitus, obesity, hypertension and dyslipidemia was admitted to [facility] on 11/19 with MRSA right upper lobe pneumonia, right lower lobe subsegmental pulmonary embolism she was treated with IV antibiotics and Lovenox for anticoagulation, subsequently developed right-sided hydropneumothorax on 11/22 underwent chest tube insertion and was transferred to [facility] for T CTS evaluation. -Upon arrival here, CT chest noted moderately large right pneumothorax, extensive subcutaneous emphysema throughout the right chest wall extending to the neck, had a chest tube placed on 11/24 -Then complicated by hemothorax, acute blood loss anemia, on 11/28 underwent evacuation of hematoma, mechanical pleurodesis and wound VAC placement -Subsequently on 12/3 underwent right chest wall wound closure and removal of wound VAC -Chest tubes removed on 12/ 6 after several days of minimal drainage without air leak -On 12/7 developed acute worsening severe subcutaneous emphysema extending to the neck and face, had another chest tube placed on the right by T CTS -Had persistent right pneumothorax and finally underwent another right chest tube placement in IR on 12/9 -Also seen and followed by palliative care this admission -Transferred from PCCM to [facility] service today 12/10 -Right superior chest tube was removed to the proximal port was outside the chest wall  Assessment & Plan:   Complicated pneumothorax Extensive subcutaneous emphysema MRSA pneumonia -Completed antibiotic therapy -Initial chest tube placed at outside hospital on 11/24 followed by repeat chest tube placement on 11/28, both chest tubes were removed on 12/6 with acute worsening subcutaneous emphysema and pneumothorax -Repeat chest tube placement on  12/7, and finally in IR on 12/9 -Superior chest tube which was dislodged and potentially contributing to subcutaneous emphysema was removed 12/10 -Appears slightly more stable today, both chest tubes placed to suction -per TCTS, restart DVT prophylaxis versus anticoagulation for recent PE when ok with TCTS  Recent subsegmental pulmonary embolism -Was initially on Lovenox, then developed acute blood loss anemia and massive chest wall hematoma which required evacuation -Echocardiogram did not note any right heart strain -Anticoagulation currently on hold, still with bloody output from chest tube -Restart with Lovenox or heparin when appropriate per T CTS  Acute blood loss anemia -In the setting of hemothorax and intermittent bleeding on anticoagulation -Received 4 units of PRBC this admission -Hemoglobin stable now, monitor  COPD -continue nebs -wean O2 as tolerated  Type 2 diabetes mellitus -CBGs more stable, continue current dose of Lantus and sliding scale  Obesity -BMI 33.5   DVT prophylaxis: SCDs Code Status: Full code Family Communication: No family at bedside Disposition Plan:  Status is: Inpatient  Remains inpatient appropriate because:Inpatient level of care appropriate due to severity of illness   Dispo: The patient is from: Home              Anticipated d/c is to: Home              Anticipated d/c date is: > 3 days              Patient currently is not medically stable to d/c.  Consultants:   See CTS   Procedures: Multiple procedures as outlined my note  Antimicrobials:    Subjective: -Feels a little better today, continues to have discomfort at the chest tube site  Objective: Vitals:   05/25/20 0400 05/25/20 0739 05/25/20 0747 05/25/20 0821  BP:  (!) 97/45  Marland Kitchen)  92/50  Pulse:  (!) 120  (!) 118  Resp:  16  19  Temp:  99.6 F (37.6 C) 99.2 F (37.3 C) 99.1 F (37.3 C)  TempSrc:  Oral Oral Oral  SpO2:  93%  93%  Weight: 100.1 kg     Height:         Intake/Output Summary (Last 24 hours) at 05/25/2020 1130 Last data filed at 05/25/2020 0600 Gross per 24 hour  Intake 260 ml  Output 2248 ml  Net -1988 ml   Filed Weights   05/23/20 0326 05/24/20 0430 05/25/20 0400  Weight: 99.1 kg 100.2 kg 100.1 kg    Examination:  General exam: Morbidly obese female appears older than stated age, appears more comfortable today, AAOx3 HEENT: Neck obese, subcu subcutaneous emphysema noted especially in upper chest wall and neck, eyelids CVS: S1-S2, distant heart sounds Lungs: Extensive subcutaneous emphysema across the chest wall especially the upper lobes, 2 chest tubes to suction, poor air movement bilaterally Abdomen: Obese, soft, nontender, bowel sounds present Extremities: No edema  Psychiatry:  Mood & affect appropriate.     Data Reviewed:   CBC: Recent Labs  Lab 05/21/20 0045 05/22/20 0721 05/23/20 0310 05/24/20 0500 05/25/20 0158  WBC 3.9* 5.2 5.1 6.7 7.2  NEUTROABS 2.4 3.2 2.9 4.6 4.9  HGB 8.9* 9.3* 9.7* 9.0* 8.7*  HCT 28.0* 29.9* 31.2* 29.6* 28.1*  MCV 92.7 93.4 93.4 94.9 94.6  PLT 100* 144* 158 206 167   Basic Metabolic Panel: Recent Labs  Lab 05/20/20 0246 05/21/20 0045 05/21/20 1858 05/22/20 0721 05/23/20 0310 05/23/20 1259 05/24/20 0500 05/25/20 0158  NA 138 137  --   --   --  136 134* 135  K 4.7 3.9  --   --   --  4.3 3.9 4.1  CL 89* 90*  --   --   --  88* 90* 92*  CO2 41* 37*  --   --   --  35* 33* 35*  GLUCOSE 185* 198* 422*  --   --  120* 170* 133*  BUN 15 15  --   --   --  26* 31* 31*  CREATININE 0.77 0.67  --   --   --  0.89 0.86 0.83  CALCIUM 8.4* 8.8*  --   --   --  8.9 8.6* 8.6*  MG 1.9 1.9  --  1.8 1.9  --  2.0  --   PHOS 4.2 3.9  --  4.5 5.6*  --  3.8  --    GFR: Estimated Creatinine Clearance: 89.2 mL/min (by C-G formula based on SCr of 0.83 mg/dL). Liver Function Tests: Recent Labs  Lab 05/19/20 0159 05/20/20 0246 05/21/20 0045  AST 19 17 13*  ALT 15 15 14   ALKPHOS 58 58 60   BILITOT 0.8 0.8 0.8  PROT 4.7* 4.7* 5.2*  ALBUMIN 1.7* 1.8* 1.9*   No results for input(s): LIPASE, AMYLASE in the last 168 hours. No results for input(s): AMMONIA in the last 168 hours. Coagulation Profile: No results for input(s): INR, PROTIME in the last 168 hours. Cardiac Enzymes: No results for input(s): CKTOTAL, CKMB, CKMBINDEX, TROPONINI in the last 168 hours. BNP (last 3 results) No results for input(s): PROBNP in the last 8760 hours. HbA1C: No results for input(s): HGBA1C in the last 72 hours. CBG: Recent Labs  Lab 05/24/20 1604 05/24/20 2013 05/25/20 0013 05/25/20 0348 05/25/20 0736  GLUCAP 292* 469* 105* 195* 154*   Lipid Profile: No results  for input(s): CHOL, HDL, LDLCALC, TRIG, CHOLHDL, LDLDIRECT in the last 72 hours. Thyroid Function Tests: No results for input(s): TSH, T4TOTAL, FREET4, T3FREE, THYROIDAB in the last 72 hours. Anemia Panel: No results for input(s): VITAMINB12, FOLATE, FERRITIN, TIBC, IRON, RETICCTPCT in the last 72 hours. Urine analysis: No results found for: COLORURINE, APPEARANCEUR, LABSPEC, PHURINE, GLUCOSEU, HGBUR, BILIRUBINUR, KETONESUR, PROTEINUR, UROBILINOGEN, NITRITE, LEUKOCYTESUR Sepsis Labs: (procalcitonin:4,lacticidven:4)  ) Recent Results (from the past 240 hour(s))  SARS Coronavirus 2 by RT PCR (hospital order, performed in [facility] hospital lab) Nasopharyngeal Nasopharyngeal Swab     Status: None   Collection Time: 05/20/20  3:51 PM   Specimen: Nasopharyngeal Swab  Result Value Ref Range Status   SARS Coronavirus 2 NEGATIVE NEGATIVE Final    Comment: (NOTE) SARS-CoV-2 target nucleic acids are NOT DETECTED.  The SARS-CoV-2 RNA is generally detectable in upper and lower respiratory specimens during the acute phase of infection. The lowest concentration of SARS-CoV-2 viral copies this assay can detect is 250 copies / mL. A negative result does not preclude SARS-CoV-2 infection and should not be used as the sole  basis for treatment or other patient management decisions.  A negative result may occur with improper specimen collection / handling, submission of specimen other than nasopharyngeal swab, presence of viral mutation(s) within the areas targeted by this assay, and inadequate number of viral copies (<250 copies / mL). A negative result must be combined with clinical observations, patient history, and epidemiological information.  Fact Sheet for Patients:   BoilerBrush.com.cy  Fact Sheet for Healthcare Providers: https://pope.com/  This test is not yet approved or  cleared by the [place] FDA and has been authorized for detection and/or diagnosis of SARS-CoV-2 by FDA under an Emergency Use Authorization (EUA).  This EUA will remain in effect (meaning this test can be used) for the duration of the COVID-19 declaration under Section 564(b)(1) of the Act, 21 U.S.C. section 360bbb-3(b)(1), unless the authorization is terminated or revoked sooner.  Performed at [facility], [address], [place], [place] [address]          Radiology Studies: DG CHEST PORT 1 VIEW  Result Date: 05/25/2020 CLINICAL DATA:  Extensive subcutaneous emphysema. EXAM: PORTABLE CHEST 1 VIEW COMPARISON:  Chest radiograph 05/24/2020, CT 05/22/2020 FINDINGS: Extensive subcutaneous emphysema within the LEFT and RIGHT chest wall unchanged. RIGHT pigtail catheter in place. Consolidation in the RIGHT lung again noted. LEFT lung appears clear. IMPRESSION: 1. No interval change in extensive subcutaneous emphysema. 2. RIGHT pigtail catheter in place. 3. RIGHT lung upper lobe consolidation. Electronically Signed   By: Genevive Bi M.D.   On: 05/25/2020 07:09   DG CHEST PORT 1 VIEW  Result Date: 05/24/2020 CLINICAL DATA:  Hypoxia. EXAM: PORTABLE CHEST 1 VIEW COMPARISON:  Same day. FINDINGS: The heart size and mediastinal contours are within normal limits.  There remains severe and extensive subcutaneous emphysema overlying the chest bilaterally as well as supraclavicular regions. Stable position of right-sided pigtail chest tube. The more inferior of the 2 large bore right-sided chest tubes noted on prior exam is unchanged. The other large-bore chest tube has been removed. Stable right upper and lower lobe opacities are noted concerning for pneumonia or atelectasis. Some degree of pleural effusion may be present on the right. The visualized skeletal structures are unremarkable. IMPRESSION: Stable severe and extensive subcutaneous emphysema overlying the chest bilaterally as well as supraclavicular regions. Stable position of right-sided pigtail chest tube as well as right basilar large-bore chest  tube. The other large-bore chest tube noted on prior exam has been removed. Stable right upper and lower lobe opacities are noted concerning for pneumonia or atelectasis. Electronically Signed   By: Lupita Raider M.D.   On: 05/24/2020 15:25   DG Chest Port 1 View  Result Date: 05/24/2020 CLINICAL DATA:  Angina for abnormal respirations. EXAM: PORTABLE CHEST 1 VIEW COMPARISON:  Chest x-ray 05/23/2020.  CT 05/23/2020, 05/22/2020. FINDINGS: Interim placement of pigtail chest tube, its tip is over the right mid chest. Superior most standard chest tube side hole is again noted in the chest wall soft tissues in unchanged position. Inferior-most standard chest tube in stable position over the right lower chest. Known pneumothoraces are difficult to visualized. Reference is made to chest CT report of 05/23/2020 and 05/22/2020. Extensive bilateral neck and chest wall subcutaneous emphysema is again noted. Persistent right upper lung consolidation and bilateral interstitial prominence again noted. Heart size stable. IMPRESSION: 1. Interim placement of pigtail chest tube, its tip is over the right mid chest. 2. Superior most standard right chest tube side hole again noted in the  chest wall soft tissues. Inferior-most standard right chest tube in stable position. Known pneumothoraces poorly visualized by chest x-ray, reference is made to prior CT reports of 05/23/2020 and 05/22/2020. Extensive bilateral neck and chest wall subcutaneous emphysema again noted. 3. Persistent right upper lung consolidation and bilateral interstitial changes again noted. Electronically Signed   By: Maisie Fus  Register   On: 05/24/2020 06:52   CT IMAGE GUIDED DRAINAGE BY PERCUTANEOUS CATHETER  Result Date: 05/23/2020 INDICATION: 59 year old female with massive subcutaneous emphysema and complicated right pneumothorax. Two chest tubes were placed surgically yesterday. Patient has a persistent anterior pneumothorax component and presents for placement of an image guided thoracostomy tube into this location. EXAM: Chest tube placement with CT guidance MEDICATIONS: The patient is currently admitted to the hospital and receiving intravenous antibiotics. The antibiotics were administered within an appropriate time frame prior to the initiation of the procedure. ANESTHESIA/SEDATION: Fentanyl 50 mcg IV; Versed 1 mg IV Moderate Sedation Time:  23 minutes The patient was continuously monitored during the procedure by the interventional radiology nurse under my direct supervision. COMPLICATIONS: None immediate. PROCEDURE: Informed written consent was obtained from the patient after a thorough discussion of the procedural risks, benefits and alternatives. All questions were addressed. Maximal Sterile Barrier Technique was utilized including caps, mask, sterile gowns, sterile gloves, sterile drape, hand hygiene and skin antiseptic. A timeout was performed prior to the initiation of the procedure. A planning axial CT scan was performed. The anterior pneumothorax was successfully identified. A suitable skin entry site was selected and marked. The skin was sterilely prepped and draped in standard fashion using chlorhexidine skin  prep. Local anesthesia was attained by infiltration with 1% lidocaine. A small dermatotomy was made. Under intermittent CT guidance, an 18 gauge trocar needle was advanced over a rib and into the pleural space in the region of the pneumothorax. A 0.035 wire was then coiled in the pleural space. The subcutaneous tract was dilated to 10 [place]. A Cook 10 [place] all-purpose drainage catheter was advanced over the wire and formed. The catheter was connected to low wall suction via a pleur-evac. The catheter was then secured to the skin with 0 Prolene suture and bandages were applied. Post placement CT imaging demonstrates a well-positioned chest tube with decreased anterior pneumothorax. A small amount of pneumothorax persists. IMPRESSION: Successful placement of 10 French pigtail thoracostomy tube into the loculated  anterior pneumothorax. Electronically Signed   By: Malachy MoanHeath  McCullough M.D.   On: 05/23/2020 16:27        Scheduled Meds: . atorvastatin  40 mg Oral Daily  . bisacodyl  10 mg Oral Daily  . Chlorhexidine Gluconate Cloth  6 each Topical Daily  . cholecalciferol  1,000 Units Oral Daily  . clonazePAM  0.5 mg Oral BID  . fenofibrate  160 mg Oral Daily  . furosemide  40 mg Intravenous BID  . Gerhardt's butt cream   Topical BID  . guaiFENesin  600 mg Oral BID  . insulin aspart  0-15 Units Subcutaneous Q4H  . insulin glargine  20 Units Subcutaneous QHS  . lidocaine  1 patch Transdermal Q24H  . multivitamin with minerals  1 tablet Oral Daily  . nystatin   Topical BID  . pregabalin  150 mg Oral TID  . senna-docusate  1 tablet Oral QHS  . sodium chloride flush  10-40 mL Intracatheter Q12H  . umeclidinium bromide  1 puff Inhalation Daily   Continuous Infusions:   LOS: 18 days    Time spent: 25min  Zannie CovePreetha Jad Johansson, MD [facility]  05/25/2020, 11:30 AM

## 2020-05-25 NOTE — Progress Notes (Addendum)
Palliative Medicine Inpatient Follow Up Note  Reason for consult:  Goals of Care "patient who is noncompliant, frequent flyer, multisystem organ failure.  Evaluate DNR, discuss goals of care palliative vs hospice"  HPI:  Per intake H&P --> 59 yr WF withrecent COVID-19about 2 months ago, recent PE on Eliquis, COPD/Asthma not on oxygen, DM-2, HTN and HLD. Admitted for RUL MRSA pneumonia and RLL subsegmental PE(TTE with mod PAH but no right heart strain) treated with vancomycin, then Zyvox, and Lovenox. She developed right-sidedhydropneumothorax on 11/22 underwent chest tube insertion on 05/06/2020 and transferred to [facility] for CTS evaluation and treatment.   Palliative care was asked to get involved to further discuss goals of care.  Today's Discussion (05/25/2020): Chart reviewed.  I spoke to patient's bedside nursing staff this morning, Nadine.  Patient is more stable this morning certainly than last night.  She is on 8 L nasal cannula.  She is mentating well.  Upon assessment it appears that she has decreased swelling of her eye orbits and is now able to partially open her eyes to see.  Jagger herself shares that she is feeling overall well today.  We discussed the concerns regarding her oxygen state and her desaturations with any form of movement.  She remains optimistic for the future and for a degree of recovery.  She states that she wants to go home with home health and that her daughter-in-law Caryl Pina will care for her.  Caryl Pina does plan on coming in today after 2:00 and to visit with Juliann Pulse and get a better impression as to how she is doing overall. _____________________________________________________________________ Addendum: Met with patient and her daughter in law Caryl Pina in early evening. We discussed her current clinical state. Caryl Pina shares that she would like to take [place] home where she will be her primary caregiver with supplemental home health support and resumption of OP  Palliative care. We discussed in detail her tenuous state and the reality that improvements will take quite sometime. Provided best case worst case scenarios to patient and Caryl Pina. Best case she improves to the point where she can get home. Worst case she continues to decline with no clear end in site at which point more difficult decisions would need to be made. They understood this. We again discussed code status. Nohelia seems to struggling with the idea of whether or not she would want intubation. I shared with her that I would worry about intubation with her given the severity of her lung disease. She expresses the same concerns. She and Caryl Pina have elected to discuss this more amongst themselves.   At this point I have lowered Nayvie's O2 to 5LPM - she appears stable at this moment. Coordinated with bedside RN, Justice Rocher who will keep a close eye. Her orbital SubQ air appears to be improving and is notably decreased this evening as is her upper chest.   We will continue to follow along and offer support.  Time in: 1700 Time Out: 1730 Additional Time: 30 minutes  Objective Assessment: Vital Signs Vitals:   05/25/20 0747 05/25/20 0821  BP:  (!) 92/50  Pulse:  (!) 118  Resp:  19  Temp: 99.2 F (37.3 C) 99.1 F (37.3 C)  SpO2:  93%    Intake/Output Summary (Last 24 hours) at 05/25/2020 0913 Last data filed at 05/25/2020 0600 Gross per 24 hour  Intake 260 ml  Output 2248 ml  Net -1988 ml   Last Weight  Most recent update: 05/25/2020  4:32 AM  Weight  100.1 kg (220 lb 10.9 oz)           SUMMARY OF RECOMMENDATIONS Limited code as it stands right now would wish for intubation, shocks, and pressors. Would not wish for CPR.   Advance Directives completed (05/24/2020) --> Please note these supersede any prior advance directives  HCPOA selected is patients daughter in law Caryl Pina Breidenbach  Living will completed  Plan to meet with the patient's daughter-in-law later this  afternoon  TOC - Patient receives outpatient palliative care through "[facility]"  Ongoing incremental palliative support throughout this hospitalization  Time Spent: 25 Greater than 50% of the time was spent in counseling and coordination of care ______________________________________________________________________________________ [place] Team Team Cell Phone: [phone] Please utilize secure chat with additional questions, if there is no response within 30 minutes please call the above phone number  Palliative Medicine Team providers are available by phone from 7am to 7pm daily and can be reached through the team cell phone.  Should this patient require assistance outside of these hours, please call the patient's attending physician.

## 2020-05-25 NOTE — Progress Notes (Signed)
RN unable to draw blood from the Midline. Phelobotomist drew blood.

## 2020-05-25 NOTE — Progress Notes (Signed)
Spoke with Dr. Loney Loh Hospitalist on call regarding patient blood sugar greater than 400. Instructed to give the 15 units of Humalog and give the scheduled Lantus at 2200. Continue to monitor blood sugar.

## 2020-05-25 NOTE — Progress Notes (Signed)
Overnight floor coverage  Notified by RN about the patient's vital signs.  Temperature 100.3 F.  She was given Tylenol and fever has now resolved.  Continues to be tachycardic with heart rate in the 110s.  Tachypneic with respiratory rate in the mid 20s.  Blood pressure low with systolic in the 90s.  Stable on 5 L supplemental oxygen.  Patient has already completed antibiotic therapy for pneumonia.  No leukocytosis on labs done earlier today.  -500 cc fluid bolus ordered, continue to monitor blood pressure and vital signs.  Repeat CBC and check lactic acid level.  Check procalcitonin level.  Repeat blood cultures ordered.  Check urinalysis.

## 2020-05-26 ENCOUNTER — Inpatient Hospital Stay (HOSPITAL_COMMUNITY): Payer: Medicare Other

## 2020-05-26 LAB — CBC WITH DIFFERENTIAL/PLATELET
Abs Immature Granulocytes: 0.02 10*3/uL (ref 0.00–0.07)
Basophils Absolute: 0 10*3/uL (ref 0.0–0.1)
Basophils Relative: 0 %
Eosinophils Absolute: 0.3 10*3/uL (ref 0.0–0.5)
Eosinophils Relative: 4 %
HCT: 26.6 % — ABNORMAL LOW (ref 36.0–46.0)
Hemoglobin: 8.2 g/dL — ABNORMAL LOW (ref 12.0–15.0)
Immature Granulocytes: 0 %
Lymphocytes Relative: 22 %
Lymphs Abs: 1.5 10*3/uL (ref 0.7–4.0)
MCH: 29.1 pg (ref 26.0–34.0)
MCHC: 30.8 g/dL (ref 30.0–36.0)
MCV: 94.3 fL (ref 80.0–100.0)
Monocytes Absolute: 0.7 10*3/uL (ref 0.1–1.0)
Monocytes Relative: 10 %
Neutro Abs: 4.5 10*3/uL (ref 1.7–7.7)
Neutrophils Relative %: 64 %
Platelets: 177 10*3/uL (ref 150–400)
RBC: 2.82 MIL/uL — ABNORMAL LOW (ref 3.87–5.11)
RDW: 19.2 % — ABNORMAL HIGH (ref 11.5–15.5)
WBC: 7 10*3/uL (ref 4.0–10.5)
nRBC: 0 % (ref 0.0–0.2)

## 2020-05-26 LAB — GLUCOSE, CAPILLARY
Glucose-Capillary: 188 mg/dL — ABNORMAL HIGH (ref 70–99)
Glucose-Capillary: 210 mg/dL — ABNORMAL HIGH (ref 70–99)
Glucose-Capillary: 218 mg/dL — ABNORMAL HIGH (ref 70–99)
Glucose-Capillary: 258 mg/dL — ABNORMAL HIGH (ref 70–99)
Glucose-Capillary: 280 mg/dL — ABNORMAL HIGH (ref 70–99)
Glucose-Capillary: 291 mg/dL — ABNORMAL HIGH (ref 70–99)
Glucose-Capillary: 295 mg/dL — ABNORMAL HIGH (ref 70–99)

## 2020-05-26 LAB — BASIC METABOLIC PANEL
Anion gap: 9 (ref 5–15)
BUN: 25 mg/dL — ABNORMAL HIGH (ref 6–20)
CO2: 33 mmol/L — ABNORMAL HIGH (ref 22–32)
Calcium: 8.7 mg/dL — ABNORMAL LOW (ref 8.9–10.3)
Chloride: 94 mmol/L — ABNORMAL LOW (ref 98–111)
Creatinine, Ser: 0.87 mg/dL (ref 0.44–1.00)
GFR, Estimated: >60 mL/min (ref >60)
Glucose, Bld: 225 mg/dL — ABNORMAL HIGH (ref 70–99)
Potassium: 3.8 mmol/L (ref 3.5–5.1)
Sodium: 136 mmol/L (ref 135–145)

## 2020-05-26 LAB — CBC
HCT: 27.4 % — ABNORMAL LOW (ref 36.0–46.0)
Hemoglobin: 8.4 g/dL — ABNORMAL LOW (ref 12.0–15.0)
MCH: 28.9 pg (ref 26.0–34.0)
MCHC: 30.7 g/dL (ref 30.0–36.0)
MCV: 94.2 fL (ref 80.0–100.0)
Platelets: 193 10*3/uL (ref 150–400)
RBC: 2.91 MIL/uL — ABNORMAL LOW (ref 3.87–5.11)
RDW: 19.3 % — ABNORMAL HIGH (ref 11.5–15.5)
WBC: 7.5 10*3/uL (ref 4.0–10.5)
nRBC: 0 % (ref 0.0–0.2)

## 2020-05-26 LAB — LACTIC ACID, PLASMA: Lactic Acid, Venous: 1 mmol/L (ref 0.5–1.9)

## 2020-05-26 LAB — PROCALCITONIN: Procalcitonin: 0.26 ng/mL

## 2020-05-26 MED ORDER — ENOXAPARIN SODIUM 40 MG/0.4ML ~~LOC~~ SOLN
40.0000 mg | SUBCUTANEOUS | Status: DC
  Administered 2020-05-26 – 2020-06-04 (×9): 40 mg via SUBCUTANEOUS
  Filled 2020-05-26 (×9): qty 0.4

## 2020-05-26 NOTE — Progress Notes (Signed)
   Palliative Medicine Inpatient Follow Up Note  Reason for consult:  Goals of Care "patient who is noncompliant, frequent flyer, multisystem organ failure.  Evaluate DNR, discuss goals of care palliative vs hospice"  HPI:  Per intake H&P --> 59 yr WF withrecent COVID-19about 2 months ago, recent PE on Eliquis, COPD/Asthma not on oxygen, DM-2, HTN and HLD. Admitted for RUL MRSA pneumonia and RLL subsegmental PE(TTE with mod PAH but no right heart strain) treated with vancomycin, then Zyvox, and Lovenox. She developed right-sidedhydropneumothorax on 11/22 underwent chest tube insertion on 05/06/2020 and transferred to [facility] for CTS evaluation and treatment.   Palliative care was asked to get involved to further discuss goals of care.  Today's Discussion (05/26/2020): Chart reviewed.  Annette [name] looks well this morning.  She is on 3 L nasal cannula appears to be breathing quite comfortably without any notable distress.  She shares that she to is feeling improved.  Her subcutaneous swelling of her bilateral eye orbits has decreased significantly as has the swelling in her upper chest.  She does not express that she has any questions or concerns this morning for the palliative care team.    I shared with her that we will continue to be following along with her chart but at this point in time we will no longer be seeing her every day unless something changes.  She was appreciative of this update.  Objective Assessment: Vital Signs Vitals:   05/26/20 0923 05/26/20 0942  BP: 110/63   Pulse: 85 84  Resp: 13 20  Temp: 97.7 F (36.5 C)   SpO2: 95% 96%    Intake/Output Summary (Last 24 hours) at 05/26/2020 0957 Last data filed at 05/26/2020 9892 Gross per 24 hour  Intake 360 ml  Output --  Net 360 ml   Last Weight  Most recent update: 05/25/2020  4:32 AM   Weight  100.1 kg (220 lb 10.9 oz)           SUMMARY OF RECOMMENDATIONS Limited code as it stands right now would wish for  intubation, shocks, and pressors. Would not wish for CPR.   Advance Directives completed (05/24/2020) --> Please note these supersede any prior advance directives  TOC - Patient receives outpatient palliative care through [facility]"  Ideally plan will be for patient to go home with home health support.  Her daughter-in-law Morrie Sheldon will be her primary caregiver.  Ongoing incremental palliative support throughout this hospitalization, if aid is needed sooner please do not hesitate to call the palliative care medical team  Time Spent: 15 Greater than 50% of the time was spent in counseling and coordination of care ______________________________________________________________________________________ Lamarr Lulas [facility] Palliative Medicine Team Team Cell Phone: [phone] Please utilize secure chat with additional questions, if there is no response within 30 minutes please call the above phone number  Palliative Medicine Team providers are available by phone from 7am to 7pm daily and can be reached through the team cell phone.  Should this patient require assistance outside of these hours, please call the patient's attending physician.

## 2020-05-26 NOTE — Progress Notes (Signed)
PROGRESS NOTE    Annette ModenaKathy Segal  ONG:[id number] DOB: 08/12/1960 DOA: 05/07/2020 PCP: Patient, No Pcp Per  Brief Narrative: 59 year old female with history of COPD, type 2 diabetes mellitus, obesity, hypertension and dyslipidemia was admitted to [facility] on 11/19 with MRSA right upper lobe pneumonia, right lower lobe subsegmental pulmonary embolism she was treated with IV antibiotics and Lovenox for anticoagulation, subsequently developed right-sided hydropneumothorax on 11/22 underwent chest tube insertion and [facility]. -Upon arrival here, CT chest noted moderately large right pneumothorax, extensive subcutaneous emphysema throughout the right chest wall extending to the neck, had a chest tube placed on 11/24 -Then complicated by hemothorax, acute blood loss anemia, on 11/28 underwent evacuation of hematoma, mechanical pleurodesis and wound VAC placement -Subsequently on 12/3 underwent right chest wall wound closure and removal of wound VAC -Chest tubes removed on 12/ 6 after several days of minimal drainage without air leak -On 12/7 developed acute worsening severe subcutaneous emphysema extending to the neck and face, had another chest tube placed on the right by T CTS -Had persistent right pneumothorax and finally underwent another right chest tube placement in IR on 12/9 -Also seen and followed by palliative care this admission -[facility] -Right superior chest tube was removed to the proximal port was outside the chest wall  Assessment & Plan:   Complicated pneumothorax Extensive subcutaneous emphysema MRSA pneumonia -Completed antibiotic therapy -Initial chest tube placed at outside hospital on 11/24 followed by repeat chest tube placement on 11/28, both chest tubes were removed on 12/6 with acute worsening subcutaneous emphysema and pneumothorax -Repeat chest tube placement on 12/7, and  another in IR on 12/9 -Superior chest tube which was dislodged and contributing to subcutaneous emphysema was removed 12/10 -Appears slightly more stable today, both chest tubes placed to suction -Repeat chest x-ray is stable -Start Lovenox for DVT prophylaxis today and resume full dose when okay with TCTS -Per T CTS  Recent subsegmental pulmonary embolism -Was initially on Lovenox, then developed acute blood loss anemia and massive chest wall hematoma which required evacuation -Echocardiogram did not note any right heart strain -Anticoagulation currently on hold, no overt bleeding at this time -Restart anticoagulation when appropriate per T CTS -Start Lovenox for DVT prophylaxis  Acute blood loss anemia -In the setting of hemothorax and intermittent bleeding on anticoagulation -Received 4 units of PRBC this admission -Hemoglobin stable now, monitor  COPD -continue nebs -wean O2 as tolerated  Type 2 diabetes mellitus -CBGs more stable, continue current dose of Lantus and sliding scale  Obesity -BMI 33.5   DVT prophylaxis: SCDs Code Status: Full code Family Communication: No family at bedside Disposition Plan:  Status is: Inpatient  Remains inpatient appropriate because:Inpatient level of care appropriate due to severity of illness   Dispo: The patient is from: Home              Anticipated d/c is to: Home              Anticipated d/c date is: > 3 days              Patient currently is not medically stable to d/c.  Consultants:   See CTS   Procedures: Multiple procedures as outlined my note  Antimicrobials:    Subjective: -Feels a little better, continues to have discomfort in the chest tubes  Objective: Vitals:   05/26/20 0316 05/26/20 0800 05/26/20 0923 05/26/20 0942  BP:  101/64 (!) 98/57 110/63   Pulse: 90 84 85 84  Resp: (!) 22 (!) 21 13 20   Temp: 98.5 F (36.9 C) 98.2 F (36.8 C) 97.7 F (36.5 C)   TempSrc: Oral Oral Oral   SpO2: 92% 96% 95% 96%   Weight:      Height:        Intake/Output Summary (Last 24 hours) at 05/26/2020 1134 Last data filed at 05/26/2020 14/05/2020 Gross per 24 hour  Intake 360 ml  Output --  Net 360 ml   Filed Weights   05/23/20 0326 05/24/20 0430 05/25/20 0400  Weight: 99.1 kg 100.2 kg 100.1 kg    Examination:  General exam: Morbidly obese female appears older than stated age, appears more comfortable today, AAOx3 HEENT: Neck obese with subcutaneous emphysema in the upper chest wall, neck, eyelids CVS: S1-S2, distant heart sounds Lungs: Extensive subcutaneous emphysema across chest wall especially in the upper lobes, 2 chest tubes to suction, poor air movement bilaterally  Abdomen: Obese, soft, nontender, bowel sounds present Extremities: No edema  Psychiatry:  Mood & affect appropriate.     Data Reviewed:   CBC: Recent Labs  Lab 05/22/20 0721 05/23/20 0310 05/24/20 0500 05/25/20 0158 05/25/20 2320 05/26/20 0219  WBC 5.2 5.1 6.7 7.2 7.5 7.0  NEUTROABS 3.2 2.9 4.6 4.9  --  4.5  HGB 9.3* 9.7* 9.0* 8.7* 8.4* 8.2*  HCT 29.9* 31.2* 29.6* 28.1* 27.4* 26.6*  MCV 93.4 93.4 94.9 94.6 94.2 94.3  PLT 144* 158 206 167 193 177   Basic Metabolic Panel: Recent Labs  Lab 05/20/20 0246 05/21/20 0045 05/21/20 1858 05/22/20 0721 05/23/20 0310 05/23/20 1259 05/24/20 0500 05/25/20 0158 05/26/20 0219  NA 138 137  --   --   --  136 134* 135 136  K 4.7 3.9  --   --   --  4.3 3.9 4.1 3.8  CL 89* 90*  --   --   --  88* 90* 92* 94*  CO2 41* 37*  --   --   --  35* 33* 35* 33*  GLUCOSE 185* 198* 422*  --   --  120* 170* 133* 225*  BUN 15 15  --   --   --  26* 31* 31* 25*  CREATININE 0.77 0.67  --   --   --  0.89 0.86 0.83 0.87  CALCIUM 8.4* 8.8*  --   --   --  8.9 8.6* 8.6* 8.7*  MG 1.9 1.9  --  1.8 1.9  --  2.0  --   --   PHOS 4.2 3.9  --  4.5 5.6*  --  3.8  --   --    GFR: Estimated Creatinine Clearance: 85.1 mL/min (by C-G formula based on SCr of 0.87 mg/dL). Liver Function Tests: Recent Labs   Lab 05/20/20 0246 05/21/20 0045  AST 17 13*  ALT 15 14  ALKPHOS 58 60  BILITOT 0.8 0.8  PROT 4.7* 5.2*  ALBUMIN 1.8* 1.9*   No results for input(s): LIPASE, AMYLASE in the last 168 hours. No results for input(s): AMMONIA in the last 168 hours. Coagulation Profile: No results for input(s): INR, PROTIME in the last 168 hours. Cardiac Enzymes: No results for input(s): CKTOTAL, CKMB, CKMBINDEX, TROPONINI in the last 168 hours. BNP (last 3 results) No results for input(s): PROBNP in the last 8760 hours. HbA1C: No results for input(s): HGBA1C in the last 72 hours. CBG: Recent Labs  Lab 05/25/20 1558 05/25/20 1943  05/25/20 2315 05/26/20 0314 05/26/20 0753  GLUCAP 337* 211* 195* 210* 188*   Lipid Profile: No results for input(s): CHOL, HDL, LDLCALC, TRIG, CHOLHDL, LDLDIRECT in the last 72 hours. Thyroid Function Tests: No results for input(s): TSH, T4TOTAL, FREET4, T3FREE, THYROIDAB in the last 72 hours. Anemia Panel: No results for input(s): VITAMINB12, FOLATE, FERRITIN, TIBC, IRON, RETICCTPCT in the last 72 hours. Urine analysis: No results found for: COLORURINE, APPEARANCEUR, LABSPEC, PHURINE, GLUCOSEU, HGBUR, BILIRUBINUR, KETONESUR, PROTEINUR, UROBILINOGEN, NITRITE, LEUKOCYTESUR Sepsis Labs: @LABRCNTIP (procalcitonin:4,lacticidven:4)  ) Recent Results (from the past 240 hour(s))  SARS Coronavirus 2 by RT PCR (hospital order, performed in [facility] hospital lab) Nasopharyngeal Nasopharyngeal Swab     Status: None   Collection Time: 05/20/20  3:51 PM   Specimen: Nasopharyngeal Swab  Result Value Ref Range Status   SARS Coronavirus 2 NEGATIVE NEGATIVE Final    Comment: (NOTE) SARS-CoV-2 target nucleic acids are NOT DETECTED.  The SARS-CoV-2 RNA is generally detectable in upper and lower respiratory specimens during the acute phase of infection. The lowest concentration of SARS-CoV-2 viral copies this assay can detect is 250 copies / mL. A negative result does not  preclude SARS-CoV-2 infection and should not be used as the sole basis for treatment or other patient management decisions.  A negative result may occur with improper specimen collection / handling, submission of specimen other than nasopharyngeal swab, presence of viral mutation(s) within the areas targeted by this assay, and inadequate number of viral copies (<250 copies / mL). A negative result must be combined with clinical observations, patient history, and epidemiological information.  Fact Sheet for Patients:   14/06/21  Fact Sheet for Healthcare Providers: BoilerBrush.com.cy  This test is not yet approved or  cleared by the https://pope.com/ FDA and has been authorized for detection and/or diagnosis of SARS-CoV-2 by FDA under an Emergency Use Authorization (EUA).  This EUA will remain in effect (meaning this test can be used) for the duration of the COVID-19 declaration under Section 564(b)(1) of the Act, 21 U.S.C. section 360bbb-3(b)(1), unless the authorization is terminated or revoked sooner.  Performed at [facility], [address] [place], [place]          Radiology Studies: DG CHEST PORT 1 VIEW  Result Date: 05/26/2020 CLINICAL DATA:  Subcutaneous EXAM: PORTABLE CHEST 1 VIEW COMPARISON:  One day prior FINDINGS: Right-sided chest tubes are in place and unchanged from prior study. Extensive subcutaneous emphysema is again noted. There is extensive airspace disease throughout the right lung field, unchanged from prior study. There is no definite left-sided pneumothorax however evaluation is limited by the extensive subcutaneous emphysema. The heart size is unchanged. Pneumomediastinum is again noted. IMPRESSION: No significant interval change. Electronically Signed   By: 14/05/2020 M.D.   On: 05/26/2020 03:25   DG CHEST PORT 1 VIEW  Result Date: 05/25/2020 CLINICAL DATA:  Extensive  subcutaneous emphysema. EXAM: PORTABLE CHEST 1 VIEW COMPARISON:  Chest radiograph 05/24/2020, CT 05/22/2020 FINDINGS: Extensive subcutaneous emphysema within the LEFT and RIGHT chest wall unchanged. RIGHT pigtail catheter in place. Consolidation in the RIGHT lung again noted. LEFT lung appears clear. IMPRESSION: 1. No interval change in extensive subcutaneous emphysema. 2. RIGHT pigtail catheter in place. 3. RIGHT lung upper lobe consolidation. Electronically Signed   By: 14/01/2020 M.D.   On: 05/25/2020 07:09   DG CHEST PORT 1 VIEW  Result Date: 05/24/2020 CLINICAL DATA:  Hypoxia. EXAM: PORTABLE CHEST 1 VIEW COMPARISON:  Same day. FINDINGS:  The heart size and mediastinal contours are within normal limits. There remains severe and extensive subcutaneous emphysema overlying the chest bilaterally as well as supraclavicular regions. Stable position of right-sided pigtail chest tube. The more inferior of the 2 large bore right-sided chest tubes noted on prior exam is unchanged. The other large-bore chest tube has been removed. Stable right upper and lower lobe opacities are noted concerning for pneumonia or atelectasis. Some degree of pleural effusion may be present on the right. The visualized skeletal structures are unremarkable. IMPRESSION: Stable severe and extensive subcutaneous emphysema overlying the chest bilaterally as well as supraclavicular regions. Stable position of right-sided pigtail chest tube as well as right basilar large-bore chest tube. The other large-bore chest tube noted on prior exam has been removed. Stable right upper and lower lobe opacities are noted concerning for pneumonia or atelectasis. Electronically Signed   By: Lupita Raider M.D.   On: 05/24/2020 15:25    Scheduled Meds:  atorvastatin  40 mg Oral Daily   bisacodyl  10 mg Oral Daily   Chlorhexidine Gluconate Cloth  6 each Topical Daily   cholecalciferol  1,000 Units Oral Daily   clonazePAM  0.5 mg Oral BID    enoxaparin (LOVENOX) injection  40 mg Subcutaneous Q24H   fenofibrate  160 mg Oral Daily   Gerhardt's butt cream   Topical BID   guaiFENesin  600 mg Oral BID   insulin aspart  0-15 Units Subcutaneous Q4H   insulin glargine  25 Units Subcutaneous QHS   lidocaine  1 patch Transdermal Q24H   multivitamin with minerals  1 tablet Oral Daily   nystatin   Topical BID   pregabalin  150 mg Oral TID   senna-docusate  1 tablet Oral QHS   sodium chloride flush  10-40 mL Intracatheter Q12H   umeclidinium bromide  1 puff Inhalation Daily   Continuous Infusions:   LOS: 19 days    Time spent:  Zannie Cove, MD [facility]  05/26/2020, 11:34 AM

## 2020-05-26 NOTE — Progress Notes (Signed)
      [address]       [place] [address]             [phone]      9 Days Post-Op Procedure(s) (LRB): RIGHT CHEST WALL WOUND CLOSURE REMOVAL OF WOUND VAC (Right) Subjective: She feels her eyes are less puffy today. No shortness of breath.   Objective: Vital signs in last 24 hours: Temp:  [97.7 F (36.5 C)-100.3 F (37.9 C)] 97.7 F (36.5 C) (12/12 0923) Pulse Rate:  [84-120] 84 (12/12 0942) Cardiac Rhythm: Normal sinus rhythm (12/12 0710) Resp:  [13-34] 20 (12/12 0942) BP: (85-110)/(49-65) 110/63 (12/12 0923) SpO2:  [91 %-97 %] 96 % (12/12 0942)     Intake/Output from previous day: 12/11 0701 - 12/12 0700 In: 360 [P.O.:360] Out: -  Intake/Output this shift: No intake/output data recorded.  General appearance: alert, cooperative and no distress Heart: regular rate and rhythm, S1, S2 normal, no murmur, click, rub or gallop Lungs: clear to auscultation bilaterally and poor inspiratory effort Abdomen: soft, non-tender; bowel sounds normal; no masses,  no organomegaly Extremities: extremities normal, atraumatic, no cyanosis or edema Wound: clean and dry  Lab Results: Recent Labs    05/25/20 2320 05/26/20 0219  WBC 7.5 7.0  HGB 8.4* 8.2*  HCT 27.4* 26.6*  PLT 193 177   BMET:  Recent Labs    05/25/20 0158 05/26/20 0219  NA 135 136  K 4.1 3.8  CL 92* 94*  CO2 35* 33*  GLUCOSE 133* 225*  BUN 31* 25*  CREATININE 0.83 0.87  CALCIUM 8.6* 8.7*    PT/INR: No results for input(s): LABPROT, INR in the last 72 hours. ABG    Component Value Date/Time   PHART 7.400 05/12/2020 1524   HCO3 36.1 (H) 05/12/2020 1524   TCO2 38 (H) 05/12/2020 1524   O2SAT 99.0 05/12/2020 1524   CBG (last 3)  Recent Labs    05/26/20 0314 05/26/20 0753 05/26/20 1146  GLUCAP 210* 188* 218*    Assessment/Plan: S/P Procedure(s) (LRB): RIGHT CHEST WALL WOUND CLOSURE REMOVAL OF WOUND VAC (Right)  -[id number] VATS / mechanical pleurodesis with evacuation  of a large chest wall hematoma post chest tube placement at OSH   1. CV-NSR in the 80s, BP well controlled 2. Pulm-Remains on HFNC 3L. CXR showed: Right-sided chest tubes are in place and unchanged from prior study. Extensive subcutaneous emphysema is again noted. There is extensive airspace disease throughout the right lung field, unchanged from prior study. There is no definite left-sided pneumothorax however evaluation is limited by the extensive subcutaneous emphysema. The heart size is unchanged. Pneumomediastinum is again noted  Plan: Leave chest tubes to suction. CXR in the morning.    LOS: 19 days    Sharlene Dory 05/26/2020

## 2020-05-27 ENCOUNTER — Inpatient Hospital Stay (HOSPITAL_COMMUNITY): Payer: Medicare Other

## 2020-05-27 ENCOUNTER — Ambulatory Visit: Payer: Self-pay | Admitting: Cardiothoracic Surgery

## 2020-05-27 LAB — GLUCOSE, CAPILLARY
Glucose-Capillary: 150 mg/dL — ABNORMAL HIGH (ref 70–99)
Glucose-Capillary: 160 mg/dL — ABNORMAL HIGH (ref 70–99)
Glucose-Capillary: 160 mg/dL — ABNORMAL HIGH (ref 70–99)
Glucose-Capillary: 226 mg/dL — ABNORMAL HIGH (ref 70–99)
Glucose-Capillary: 235 mg/dL — ABNORMAL HIGH (ref 70–99)

## 2020-05-27 LAB — BASIC METABOLIC PANEL
Anion gap: 11 (ref 5–15)
BUN: 20 mg/dL (ref 6–20)
CO2: 28 mmol/L (ref 22–32)
Calcium: 8.7 mg/dL — ABNORMAL LOW (ref 8.9–10.3)
Chloride: 98 mmol/L (ref 98–111)
Creatinine, Ser: 0.63 mg/dL (ref 0.44–1.00)
GFR, Estimated: >60 mL/min (ref >60)
Glucose, Bld: 118 mg/dL — ABNORMAL HIGH (ref 70–99)
Potassium: 3.8 mmol/L (ref 3.5–5.1)
Sodium: 137 mmol/L (ref 135–145)

## 2020-05-27 LAB — CBC
HCT: 27.5 % — ABNORMAL LOW (ref 36.0–46.0)
Hemoglobin: 8.6 g/dL — ABNORMAL LOW (ref 12.0–15.0)
MCH: 28.9 pg (ref 26.0–34.0)
MCHC: 31.3 g/dL (ref 30.0–36.0)
MCV: 92.3 fL (ref 80.0–100.0)
Platelets: 206 10*3/uL (ref 150–400)
RBC: 2.98 MIL/uL — ABNORMAL LOW (ref 3.87–5.11)
RDW: 18.5 % — ABNORMAL HIGH (ref 11.5–15.5)
WBC: 5.2 10*3/uL (ref 4.0–10.5)
nRBC: 0 % (ref 0.0–0.2)

## 2020-05-27 NOTE — Progress Notes (Signed)
PROGRESS NOTE    Annette ModenaKathy [name]  ZOX:[id number] DOB: January 03, 1961 DOA: 05/07/2020 PCP: Patient, No Pcp Per  Brief Narrative: 59 year old female with history of COPD, type 2 diabetes mellitus, obesity, hypertension and dyslipidemia was admitted to [facility] on 11/19 with MRSA right upper lobe pneumonia, right lower lobe subsegmental pulmonary embolism she was treated with IV antibiotics and Lovenox for anticoagulation, subsequently developed right-sided hydropneumothorax on 11/22 underwent chest tube insertion and was transferred to [facility] for T CTS evaluation. -Upon arrival here, CT chest noted moderately large right pneumothorax, extensive subcutaneous emphysema throughout the right chest wall extending to the neck, had a chest tube placed on 11/24 -Then complicated by hemothorax, acute blood loss anemia, on 11/28 underwent evacuation of hematoma, mechanical pleurodesis and wound VAC placement -Subsequently on 12/3 underwent right chest wall wound closure and removal of wound VAC -Chest tubes removed on 12/ 6 after several days of minimal drainage without air leak -On 12/7 developed acute worsening severe subcutaneous emphysema extending to the neck and face, had another chest tube placed on the right by T CTS -Had persistent right pneumothorax and finally underwent another right chest tube placement in IR on 12/9 -Also seen and followed by palliative care this admission -[facility] -Right superior chest tube was removed, the proximal port was outside the chest wall  Assessment & Plan:   Complicated pneumothorax Extensive subcutaneous emphysema MRSA pneumonia -Completed antibiotic therapy -Initial chest tube placed at outside hospital on 11/24 followed by repeat chest tube placement on 11/28, both chest tubes were removed on 12/6 with acute worsening subcutaneous emphysema and pneumothorax -Repeat chest tube placement on 12/7, and  another in IR on 12/9 -Superior most chest tube which was dislodged and contributing to subcutaneous emphysema was removed 12/10 -Appears stable, both chest tubes to suction , repeat chest x-ray without pneumothorax  -Continue management per T CTS  -Started low dose Lovenox for  DVT prophylaxis   Recent subsegmental pulmonary embolism -Was initially on Lovenox, then developed acute blood loss anemia and massive chest wall hematoma which required evacuation -Echocardiogram did not note any right heart strain -Anticoagulation currently on hold, no overt bleeding at this time -Restart anticoagulation when appropriate per TCTS -Started Lovenox for DVT prophylaxis  Acute blood loss anemia -In the setting of hemothorax and intermittent bleeding on anticoagulation -Received 4 units of PRBC this admission -Hemoglobin stable now, monitor  COPD -continue nebs -wean O2 as tolerated  Type 2 diabetes mellitus -CBGs more stable, continue current dose of Lantus and sliding scale  Obesity -BMI 33.5   DVT prophylaxis: SCDs Code Status: Full code Family Communication: No family at bedside Disposition Plan:  Status is: Inpatient  Remains inpatient appropriate because:Inpatient level of care appropriate due to severity of illness -PER TCTS   Dispo: The patient is from: Home              Anticipated d/c is to: Home              Anticipated d/c date is: > 3 days              Patient currently is not medically stable to d/c.  Consultants:   See CTS   Procedures: Multiple procedures as outlined my note  Antimicrobials:    Subjective: -Feels okay overall, breathing improving,  Objective: Vitals:   05/27/20 0753 05/27/20 0802 05/27/20 0807 05/27/20 0838  BP:  (!) 108/59 107/61 127/70  Pulse:  (!) 103 Marland Kitchen(!)  102 93  Resp:  (!) 21 (!) 21 17  Temp:  98.7 F (37.1 C) 98.7 F (37.1 C) 98.5 F (36.9 C)  TempSrc:  Oral  Oral  SpO2: 94% 97% 92% 95%  Weight:      Height:         Intake/Output Summary (Last 24 hours) at 05/27/2020 1222 Last data filed at 05/27/2020 0755 Gross per 24 hour  Intake 960 ml  Output 650 ml  Net 310 ml   Filed Weights   05/23/20 0326 05/24/20 0430 05/25/20 0400  Weight: 99.1 kg 100.2 kg 100.1 kg    Examination:  General exam: Morbidly obese female appears older than stated age, AAOx3 HEENT: Neck obese with subcutaneous emphysema in the upper chest wall and neck CVS: S1-S2, distant heart sounds Lungs: Extensive subcutaneous emphysema across chest wall especially in the upper lobes, 2 chest tubes to suction, poor air movement bilaterally Abdomen: Obese, soft, nontender, bowel sounds present Extremities: No edema  Psychiatry:  Mood & affect appropriate.     Data Reviewed:   CBC: Recent Labs  Lab 05/22/20 0721 05/23/20 0310 05/24/20 0500 05/25/20 0158 05/25/20 2320 05/26/20 0219 05/27/20 0557  WBC 5.2 5.1 6.7 7.2 7.5 7.0 5.2  NEUTROABS 3.2 2.9 4.6 4.9  --  4.5  --   HGB 9.3* 9.7* 9.0* 8.7* 8.4* 8.2* 8.6*  HCT 29.9* 31.2* 29.6* 28.1* 27.4* 26.6* 27.5*  MCV 93.4 93.4 94.9 94.6 94.2 94.3 92.3  PLT 144* 158 206 167 193 177 206   Basic Metabolic Panel: Recent Labs  Lab 05/21/20 0045 05/21/20 1858 05/22/20 0721 05/23/20 0310 05/23/20 1259 05/24/20 0500 05/25/20 0158 05/26/20 0219 05/27/20 0557  NA 137  --   --   --  136 134* 135 136 137  K 3.9  --   --   --  4.3 3.9 4.1 3.8 3.8  CL 90*  --   --   --  88* 90* 92* 94* 98  CO2 37*  --   --   --  35* 33* 35* 33* 28  GLUCOSE 198*   < >  --   --  120* 170* 133* 225* 118*  BUN 15  --   --   --  26* 31* 31* 25* 20  CREATININE 0.67  --   --   --  0.89 0.86 0.83 0.87 0.63  CALCIUM 8.8*  --   --   --  8.9 8.6* 8.6* 8.7* 8.7*  MG 1.9  --  1.8 1.9  --  2.0  --   --   --   PHOS 3.9  --  4.5 5.6*  --  3.8  --   --   --    < > = values in this interval not displayed.   GFR: Estimated Creatinine Clearance: 92.6 mL/min (by C-G formula based on SCr of 0.63 mg/dL). Liver  Function Tests: Recent Labs  Lab 05/21/20 0045  AST 13*  ALT 14  ALKPHOS 60  BILITOT 0.8  PROT 5.2*  ALBUMIN 1.9*   No results for input(s): LIPASE, AMYLASE in the last 168 hours. No results for input(s): AMMONIA in the last 168 hours. Coagulation Profile: No results for input(s): INR, PROTIME in the last 168 hours. Cardiac Enzymes: No results for input(s): CKTOTAL, CKMB, CKMBINDEX, TROPONINI in the last 168 hours. BNP (last 3 results) No results for input(s): PROBNP in the last 8760 hours. HbA1C: No results for input(s): HGBA1C in the last 72 hours. CBG: Recent  Labs  Lab 05/26/20 1918 05/26/20 2348 05/27/20 0318 05/27/20 0758 05/27/20 1158  GLUCAP 258* 291* 150* 160* 160*   Lipid Profile: No results for input(s): CHOL, HDL, LDLCALC, TRIG, CHOLHDL, LDLDIRECT in the last 72 hours. Thyroid Function Tests: No results for input(s): TSH, T4TOTAL, FREET4, T3FREE, THYROIDAB in the last 72 hours. Anemia Panel: No results for input(s): VITAMINB12, FOLATE, FERRITIN, TIBC, IRON, RETICCTPCT in the last 72 hours. Urine analysis: No results found for: COLORURINE, APPEARANCEUR, LABSPEC, PHURINE, GLUCOSEU, HGBUR, BILIRUBINUR, KETONESUR, PROTEINUR, UROBILINOGEN, NITRITE, LEUKOCYTESUR Sepsis Labs: @LABRCNTIP (procalcitonin:4,lacticidven:4)  ) Recent Results (from the past 240 hour(s))  SARS Coronavirus 2 by RT PCR (hospital order, performed in [facility] hospital lab) Nasopharyngeal Nasopharyngeal Swab     Status: None   Collection Time: 05/20/20  3:51 PM   Specimen: Nasopharyngeal Swab  Result Value Ref Range Status   SARS Coronavirus 2 NEGATIVE NEGATIVE Final    Comment: (NOTE) SARS-CoV-2 target nucleic acids are NOT DETECTED.  The SARS-CoV-2 RNA is generally detectable in upper and lower respiratory specimens during the acute phase of infection. The lowest concentration of SARS-CoV-2 viral copies this assay can detect is 250 copies / mL. A negative result does not preclude  SARS-CoV-2 infection and should not be used as the sole basis for treatment or other patient management decisions.  A negative result may occur with improper specimen collection / handling, submission of specimen other than nasopharyngeal swab, presence of viral mutation(s) within the areas targeted by this assay, and inadequate number of viral copies (<250 copies / mL). A negative result must be combined with clinical observations, patient history, and epidemiological information.  Fact Sheet for Patients:   14/06/21  Fact Sheet for Healthcare Providers: BoilerBrush.com.cy  This test is not yet approved or  cleared by the https://pope.com/ FDA and has been authorized for detection and/or diagnosis of SARS-CoV-2 by FDA under an Emergency Use Authorization (EUA).  This EUA will remain in effect (meaning this test can be used) for the duration of the COVID-19 declaration under Section 564(b)(1) of the Act, 21 U.S.C. section 360bbb-3(b)(1), unless the authorization is terminated or revoked sooner.  Performed at [facility], [address] [place], [place]   Culture, blood (routine x 2)     Status: None (Preliminary result)   Collection Time: 05/25/20 11:30 PM   Specimen: BLOOD  Result Value Ref Range Status   Specimen Description BLOOD RIGHT ANTECUBITAL  Final   Special Requests   Final    BOTTLES DRAWN AEROBIC AND ANAEROBIC Blood Culture adequate volume   Culture   Final    NO GROWTH 2 DAYS Performed at [facility], [address] [place], [place]    Report Status PENDING  Incomplete  Culture, blood (routine x 2)     Status: None (Preliminary result)   Collection Time: 05/25/20 11:40 PM   Specimen: BLOOD RIGHT HAND  Result Value Ref Range Status   Specimen Description BLOOD RIGHT HAND  Final   Special Requests   Final    BOTTLES DRAWN AEROBIC AND ANAEROBIC Blood Culture adequate volume    Culture   Final    NO GROWTH 2 DAYS Performed at [facility], [address], [place], [place]    Report Status PENDING  Incomplete    Radiology Studies: DG Chest Port 1 View  Result Date: 05/27/2020 CLINICAL DATA:  Follow-up chest tube EXAM: PORTABLE CHEST 1 VIEW COMPARISON:  05/26/2020 FINDINGS: Cardiac  shadow is stable. Right-sided chest tubes are again identified and stable. Diffuse subcutaneous emphysema is again identified bilaterally. Persistent airspace opacity is noted in the right apex. No definitive pneumothorax is noted. No new focal abnormality is seen. IMPRESSION: Overall stable appearance of the chest without evidence of recurrent pneumothorax. Electronically Signed   By: Alcide Clever M.D.   On: 05/27/2020 08:07   DG CHEST PORT 1 VIEW  Result Date: 05/26/2020 CLINICAL DATA:  Subcutaneous EXAM: PORTABLE CHEST 1 VIEW COMPARISON:  One day prior FINDINGS: Right-sided chest tubes are in place and unchanged from prior study. Extensive subcutaneous emphysema is again noted. There is extensive airspace disease throughout the right lung field, unchanged from prior study. There is no definite left-sided pneumothorax however evaluation is limited by the extensive subcutaneous emphysema. The heart size is unchanged. Pneumomediastinum is again noted. IMPRESSION: No significant interval change. Electronically Signed   By: Katherine Mantle M.D.   On: 05/26/2020 03:25    Scheduled Meds: . atorvastatin  40 mg Oral Daily  . bisacodyl  10 mg Oral Daily  . Chlorhexidine Gluconate Cloth  6 each Topical Daily  . cholecalciferol  1,000 Units Oral Daily  . clonazePAM  0.5 mg Oral BID  . enoxaparin (LOVENOX) injection  40 mg Subcutaneous Q24H  . fenofibrate  160 mg Oral Daily  . Gerhardt's butt cream   Topical BID  . guaiFENesin  600 mg Oral BID  . insulin aspart  0-15 Units Subcutaneous Q4H  . insulin glargine  25 Units Subcutaneous QHS  . lidocaine  1 patch Transdermal Q24H   . multivitamin with minerals  1 tablet Oral Daily  . nystatin   Topical BID  . pregabalin  150 mg Oral TID  . senna-docusate  1 tablet Oral QHS  . sodium chloride flush  10-40 mL Intracatheter Q12H  . umeclidinium bromide  1 puff Inhalation Daily   Continuous Infusions:   LOS: 20 days    Time spent:  Zannie Cove, MD [facility]  05/27/2020, 12:22 PM

## 2020-05-27 NOTE — Progress Notes (Signed)
[address]       [place] [address]             [phone]       10 Days Post-Op Procedure(s) (LRB): RIGHT CHEST WALL WOUND CLOSURE REMOVAL OF WOUND VAC (Right) Subjective: CXR about the same, she feels better, facial swelling is decreased  Objective: Vital signs in last 24 hours: Temp:  [97.7 F (36.5 C)-98.6 F (37 C)] 98.4 F (36.9 C) (12/13 0315) Pulse Rate:  [84-115] 86 (12/13 0315) Cardiac Rhythm: Normal sinus rhythm (12/13 0705) Resp:  [13-27] 20 (12/13 0315) BP: (95-114)/(42-67) 109/67 (12/13 0315) SpO2:  [92 %-98 %] 98 % (12/13 0315)  Hemodynamic parameters for last 24 hours:    Intake/Output from previous day: 12/12 0701 - 12/13 0700 In: 480 [P.O.:480] Out: 650 [Urine:550; Chest Tube:100] Intake/Output this shift: No intake/output data recorded.  General appearance: alert, cooperative and no distress Heart: regular rate and rhythm Lungs: crackels from SQ air on right, appears to be moving air throughout lung fields Abdomen: benign Extremities: + minoe edema Wound: incis without signs or infection (minor edema)  Lab Results: Recent Labs    05/26/20 0219 05/27/20 0557  WBC 7.0 5.2  HGB 8.2* 8.6*  HCT 26.6* 27.5*  PLT 177 206   BMET:  Recent Labs    05/26/20 0219 05/27/20 0557  NA 136 137  K 3.8 3.8  CL 94* 98  CO2 33* 28  GLUCOSE 225* 118*  BUN 25* 20  CREATININE 0.87 0.63  CALCIUM 8.7* 8.7*    PT/INR: No results for input(s): LABPROT, INR in the last 72 hours. ABG    Component Value Date/Time   PHART 7.400 05/12/2020 1524   HCO3 36.1 (H) 05/12/2020 1524   TCO2 38 (H) 05/12/2020 1524   O2SAT 99.0 05/12/2020 1524   CBG (last 3)  Recent Labs    05/26/20 1918 05/26/20 2348 05/27/20 0318  GLUCAP 258* 291* 150*    Meds Scheduled Meds: . atorvastatin  40 mg Oral Daily  . bisacodyl  10 mg Oral Daily  . Chlorhexidine Gluconate Cloth  6 each Topical Daily  . cholecalciferol  1,000 Units Oral Daily  .  clonazePAM  0.5 mg Oral BID  . enoxaparin (LOVENOX) injection  40 mg Subcutaneous Q24H  . fenofibrate  160 mg Oral Daily  . Gerhardt's butt cream   Topical BID  . guaiFENesin  600 mg Oral BID  . insulin aspart  0-15 Units Subcutaneous Q4H  . insulin glargine  25 Units Subcutaneous QHS  . lidocaine  1 patch Transdermal Q24H  . multivitamin with minerals  1 tablet Oral Daily  . nystatin   Topical BID  . pregabalin  150 mg Oral TID  . senna-docusate  1 tablet Oral QHS  . sodium chloride flush  10-40 mL Intracatheter Q12H  . umeclidinium bromide  1 puff Inhalation Daily   Continuous Infusions: PRN Meds:.acetaminophen, HYDROmorphone (DILAUDID) injection, influenza vac split quadrivalent PF, levalbuterol, magnesium citrate, ondansetron (ZOFRAN) IV, oxyCODONE, pneumococcal 23 valent vaccine, polyethylene glycol, sodium chloride flush, traMADol  Xrays DG CHEST PORT 1 VIEW  Result Date: 05/26/2020 CLINICAL DATA:  Subcutaneous EXAM: PORTABLE CHEST 1 VIEW COMPARISON:  One day prior FINDINGS: Right-sided chest tubes are in place and unchanged from prior study. Extensive subcutaneous emphysema is again noted. There is extensive airspace disease throughout the right lung field, unchanged from prior study. There is no definite left-sided pneumothorax however evaluation is limited by the  extensive subcutaneous emphysema. The heart size is unchanged. Pneumomediastinum is again noted. IMPRESSION: No significant interval change. Electronically Signed   By: Katherine Mantle M.D.   On: 05/26/2020 03:25    Assessment/Plan: S/P Procedure(s) (LRB): RIGHT CHEST WALL WOUND CLOSURE REMOVAL OF WOUND VAC (Right)  1 afeb, VSS 2 sats ok , alt HFNC and [place] 3 CXR- appears stable, no air leak- keep CT's in place, 100 cc drainage yesterday- will need CT for a significant time frame per Dr Vickey Sages- could potentially d/c with Pneumostat (one way valve/fluid collection device) when closer to d/c 4 may restart ACRX for  continuation of PE treatment per Dr Vickey Sages   LOS: 20 days    Rowe Clack PA-C Pager [phone] 05/27/2020

## 2020-05-27 NOTE — Plan of Care (Signed)
  Problem: Education: Goal: Knowledge of General Education information will improve Description Including pain rating scale, medication(s)/side effects and non-pharmacologic comfort measures Outcome: Progressing   

## 2020-05-27 NOTE — Progress Notes (Signed)
Physical Therapy Treatment Patient Details Name: Annette [name] MRN: [id number] DOB: [birth year] Today's Date: 05/27/2020    History of Present Illness 59 yo female admitted to [facility] on 05/03/2020 with R upper lobe MRSA pneumonia and R lower lobe subsegmental PE. Developed right-sided hydropneumothorax on 11/22 underwent chest tube insertion and on 05/06/2020 transfered to [facility] for CTS evaluation and treatment. Additional R chest tube placement 11/24.  On 11/28, patient underwent VATS/thoracotomy mechanical pleurodesis and wound VAC placement. 3rd chest tube to be placed 12/9.  PMH including Covid pneumonia approximately 2 months ago, recent PE on Eliquis, COPD, asthma, depression, hypertension, and diabetes.    PT Comments    Pt shows increased activity tolerance with transfers and bed mobility today with ability to perform 3x sit to stand and side stepping, but still needs mod assist +2 for physical assistance. Shows decreased safety and awareness of physical deficits secondary to refusing SNF and her stating that she can be safe to go home with help from her sons. Pt states that she wants to use a rollator next session and possibly a sliding board as she believes she will be more independent and be able to get to the chair/commode. Sats during session: 3L at 94% SpO2  Follow Up Recommendations  Supervision/Assistance - 24 hour;SNF;Home health PT (pt refusing SNF)     Equipment Recommendations  3in1 (PT);Hospital bed;Other (comment) (sliding board)    Recommendations for Other Services       Precautions / Restrictions Precautions Precautions: Fall Precaution Comments: 2 chest tubes R Restrictions Weight Bearing Restrictions: No    Mobility  Bed Mobility Overal bed mobility: Needs Assistance Bed Mobility: Supine to Sit;Sit to Supine;Rolling Rolling: Mod assist   Supine to sit: Mod assist;HOB elevated Sit to supine: Mod assist;HOB elevated   General  bed mobility comments: mod assist to for leg navigation for supine to sit and sit to supine with cues needed to reach towards railing; mod assist to roll to R and L for physical assist  Transfers Overall transfer level: Needs assistance Equipment used: Rolling walker (2 wheeled) Transfers: Sit to/from Stand Sit to Stand: Mod assist;+2 physical assistance;From elevated surface         General transfer comment: 3x sit to stand each time, decreased full hip extension and ability to fully elevate trunk; pt failed side stepping towards HOB on first sit to stand attempt, needed 2 more sit to stands with about 2 steps each towards HOB, sitting rest breaks in between attempts  Ambulation/Gait             General Gait Details: not attempted   Stairs             Wheelchair Mobility    Modified Rankin (Stroke Patients Only)       Balance Overall balance assessment: Needs assistance Sitting-balance support: Bilateral upper extremity supported;Feet supported Sitting balance-Leahy Scale: Poor Sitting balance - Comments: bilat UE support needed to maintain sitting balance   Standing balance support: Bilateral upper extremity supported Standing balance-Leahy Scale: Poor Standing balance comment: pt required mod assist +2 and bilat UE support to maintain standing balance                            Cognition Arousal/Alertness: Awake/alert Behavior During Therapy: WFL for tasks assessed/performed Overall Cognitive Status: Impaired/Different from baseline Area of Impairment: Safety/judgement  Safety/Judgement: Decreased awareness of safety;Decreased awareness of deficits     General Comments: pt with decreased awareness of deficits, refusing SNF; stating that she will be able to move better with a rollator and that the floor in her hospital room is preventing her from moving optimally; further states that her sons can help her move  around at home and she will be able to move better 'in her environment'      Exercises      General Comments        Pertinent Vitals/Pain Pain Assessment: Faces Faces Pain Scale: Hurts even more Pain Location: back, L LE, chest tube sites Pain Descriptors / Indicators: Discomfort;Grimacing;Tender Pain Intervention(s): Limited activity within patient's tolerance;Monitored during session;Repositioned    Home Living                      Prior Function            PT Goals (current goals can now be found in the care plan section) Acute Rehab PT Goals Patient Stated Goal: go home PT Goal Formulation: With patient Time For Goal Achievement: 06/10/20 Potential to Achieve Goals: Fair Progress towards PT goals: Progressing toward goals    Frequency    Min 3X/week      PT Plan Discharge plan needs to be updated    Co-evaluation              AM-PAC PT "6 Clicks" Mobility   Outcome Measure  Help needed turning from your back to your side while in a flat bed without using bedrails?: A Lot Help needed moving from lying on your back to sitting on the side of a flat bed without using bedrails?: A Lot Help needed moving to and from a bed to a chair (including a wheelchair)?: Total Help needed standing up from a chair using your arms (e.g., wheelchair or bedside chair)?: A Lot Help needed to walk in hospital room?: Total Help needed climbing 3-5 steps with a railing? : Total 6 Click Score: 9    End of Session Equipment Utilized During Treatment: Oxygen;Gait belt Activity Tolerance: Patient limited by fatigue Patient left: with call bell/phone within reach;in bed;with bed alarm set   PT Visit Diagnosis: Muscle weakness (generalized) (M62.81)     Time: 5465-0354 PT Time Calculation (min) (ACUTE ONLY): 34 min  Charges:  $Therapeutic Activity: 23-37 mins                     Jeri Cos, SPT [id number]   Annette [name] 05/27/2020, 12:53 PM

## 2020-05-27 NOTE — Progress Notes (Signed)
Referring Physician(s): Dr. Tyrone Sage  Supervising Physician: Dr. Loreta Ave  Patient Status:  [facility] - In-pt  Chief Complaint: Subcutaneous emphysema  Subjective: Resting comfortably. Face swelling better.  Chest and neck appears stable.  IR and 1 TCTS chest tube remain in place.   Allergies: Cymbalta [duloxetine hcl]  Medications:  Current Facility-Administered Medications:  .  acetaminophen (TYLENOL) tablet 650 mg, 650 mg, Oral, Q6H PRN, Zannie Cove, MD, 650 mg at 05/25/20 2019 .  atorvastatin (LIPITOR) tablet 40 mg, 40 mg, Oral, Daily, Purcell Nails, MD, 40 mg at 05/27/20 0831 .  bisacodyl (DULCOLAX) EC tablet 10 mg, 10 mg, Oral, Daily, Purcell Nails, MD, 10 mg at 05/27/20 0831 .  Chlorhexidine Gluconate Cloth 2 % PADS 6 each, 6 each, Topical, Daily, Purcell Nails, MD, 6 each at 05/27/20 [phone]  cholecalciferol (VITAMIN D3) tablet 1,000 Units, 1,000 Units, Oral, Daily, Purcell Nails, MD, 1,000 Units at 05/27/20 0831 .  clonazePAM (KLONOPIN) tablet 0.5 mg, 0.5 mg, Oral, BID, Purcell Nails, MD, 0.5 mg at 05/27/20 0831 .  enoxaparin (LOVENOX) injection 40 mg, 40 mg, Subcutaneous, Q24H, Zannie Cove, MD, 40 mg at 05/27/20 0830 .  fenofibrate tablet 160 mg, 160 mg, Oral, Daily, Purcell Nails, MD, 160 mg at 05/27/20 0831 .  Gerhardt's butt cream, , Topical, BID, Purcell Nails, MD, Given at 05/27/20 [phone] .  guaiFENesin (MUCINEX) 12 hr tablet 600 mg, 600 mg, Oral, BID, Purcell Nails, MD, 600 mg at 05/27/20 0831 .  HYDROmorphone (DILAUDID) injection 0.5 mg, 0.5 mg, Intravenous, Q4H PRN, Zannie Cove, MD, 0.5 mg at 05/27/20 0825 .  influenza vac split quadrivalent PF (FLUARIX) injection 0.5 mL, 0.5 mL, Intramuscular, Prior to discharge, Gonfa, Taye T, MD .  insulin aspart (novoLOG) injection 0-15 Units, 0-15 Units, Subcutaneous, Q4H, Purcell Nails, MD, 3 Units at 05/27/20 0831 .  insulin glargine (LANTUS) injection 25 Units, 25 Units, Subcutaneous, QHS,  Zannie Cove, MD, 25 Units at 05/26/20 2107 .  levalbuterol (XOPENEX) nebulizer solution 0.63 mg, 0.63 mg, Nebulization, Q6H PRN, Purcell Nails, MD .  lidocaine (LIDODERM) 5 % 1 patch, 1 patch, Transdermal, Q24H, Purcell Nails, MD, 1 patch at 05/24/20 1649 .  magnesium citrate solution 1 Bottle, 1 Bottle, Oral, Daily PRN, Purcell Nails, MD .  multivitamin with minerals tablet 1 tablet, 1 tablet, Oral, Daily, Purcell Nails, MD, 1 tablet at 05/27/20 0830 .  nystatin (MYCOSTATIN/NYSTOP) topical powder, , Topical, BID, Purcell Nails, MD, Given at 05/27/20 [phone]  ondansetron (ZOFRAN) injection 4 mg, 4 mg, Intravenous, Q6H PRN, Purcell Nails, MD .  oxyCODONE (Oxy IR/ROXICODONE) immediate release tablet 5-10 mg, 5-10 mg, Oral, Q4H PRN, Purcell Nails, MD, 10 mg at 05/27/20 1962 .  pneumococcal 23 valent vaccine (PNEUMOVAX-23) injection 0.5 mL, 0.5 mL, Intramuscular, Prior to discharge, Gonfa, Taye T, MD .  polyethylene glycol (MIRALAX / GLYCOLAX) packet 17 g, 17 g, Oral, BID PRN, Purcell Nails, MD .  pregabalin (LYRICA) capsule 150 mg, 150 mg, Oral, TID, Purcell Nails, MD, 150 mg at 05/27/20 0830 .  senna-docusate (Senokot-S) tablet 1 tablet, 1 tablet, Oral, QHS, Purcell Nails, MD, 1 tablet at 05/26/20 2110 .  sodium chloride flush (NS) 0.9 % injection 10-40 mL, 10-40 mL, Intracatheter, Q12H, Lorin Glass, MD, 10 mL at 05/27/20 [phone] .  sodium chloride flush (NS) 0.9 % injection 10-40 mL, 10-40 mL, Intracatheter, PRN, Lorin Glass, MD .  traMADol (  ULTRAM) tablet 50-100 mg, 50-100 mg, Oral, Q6H PRN, Purcell Nailswen, Clarence H, MD, 100 mg at 05/24/20 1549 .  umeclidinium bromide (INCRUSE ELLIPTA) 62.5 MCG/INH 1 puff, 1 puff, Inhalation, Daily, Purcell Nailswen, Clarence H, MD, 1 puff at 05/27/20 0753    Vital Signs: BP 127/70 (BP Location: Right Arm)   Pulse 93   Temp 98.5 F (36.9 C) (Oral)   Resp 17   Ht 5\' 8"  (1.727 m)   Wt 100.1 kg   SpO2 95%   BMI 33.55 kg/m   Physical Exam   NAD, alert Large bore chest tube in place in inferior right chest. Anterior pigtail IR chest tube in place. Blood tinged output. No air leak from either tube.  Imaging: DG Chest Port 1 View  Result Date: 05/27/2020 CLINICAL DATA:  Follow-up chest tube EXAM: PORTABLE CHEST 1 VIEW COMPARISON:  05/26/2020 FINDINGS: Cardiac shadow is stable. Right-sided chest tubes are again identified and stable. Diffuse subcutaneous emphysema is again identified bilaterally. Persistent airspace opacity is noted in the right apex. No definitive pneumothorax is noted. No new focal abnormality is seen. IMPRESSION: Overall stable appearance of the chest without evidence of recurrent pneumothorax. Electronically Signed   By: Alcide CleverMark  Lukens M.D.   On: 05/27/2020 08:07   DG CHEST PORT 1 VIEW  Result Date: 05/26/2020 CLINICAL DATA:  Subcutaneous EXAM: PORTABLE CHEST 1 VIEW COMPARISON:  One day prior FINDINGS: Right-sided chest tubes are in place and unchanged from prior study. Extensive subcutaneous emphysema is again noted. There is extensive airspace disease throughout the right lung field, unchanged from prior study. There is no definite left-sided pneumothorax however evaluation is limited by the extensive subcutaneous emphysema. The heart size is unchanged. Pneumomediastinum is again noted. IMPRESSION: No significant interval change. Electronically Signed   By: Katherine Mantlehristopher  Green M.D.   On: 05/26/2020 03:25   DG CHEST PORT 1 VIEW  Result Date: 05/25/2020 CLINICAL DATA:  Extensive subcutaneous emphysema. EXAM: PORTABLE CHEST 1 VIEW COMPARISON:  Chest radiograph 05/24/2020, CT 05/22/2020 FINDINGS: Extensive subcutaneous emphysema within the LEFT and RIGHT chest wall unchanged. RIGHT pigtail catheter in place. Consolidation in the RIGHT lung again noted. LEFT lung appears clear. IMPRESSION: 1. No interval change in extensive subcutaneous emphysema. 2. RIGHT pigtail catheter in place. 3. RIGHT lung upper lobe consolidation.  Electronically Signed   By: Genevive BiStewart  Edmunds M.D.   On: 05/25/2020 07:09   DG CHEST PORT 1 VIEW  Result Date: 05/24/2020 CLINICAL DATA:  Hypoxia. EXAM: PORTABLE CHEST 1 VIEW COMPARISON:  Same day. FINDINGS: The heart size and mediastinal contours are within normal limits. There remains severe and extensive subcutaneous emphysema overlying the chest bilaterally as well as supraclavicular regions. Stable position of right-sided pigtail chest tube. The more inferior of the 2 large bore right-sided chest tubes noted on prior exam is unchanged. The other large-bore chest tube has been removed. Stable right upper and lower lobe opacities are noted concerning for pneumonia or atelectasis. Some degree of pleural effusion may be present on the right. The visualized skeletal structures are unremarkable. IMPRESSION: Stable severe and extensive subcutaneous emphysema overlying the chest bilaterally as well as supraclavicular regions. Stable position of right-sided pigtail chest tube as well as right basilar large-bore chest tube. The other large-bore chest tube noted on prior exam has been removed. Stable right upper and lower lobe opacities are noted concerning for pneumonia or atelectasis. Electronically Signed   By: Lupita RaiderJames  Green Jr M.D.   On: 05/24/2020 15:25   [facility]  Result Date: 05/24/2020 CLINICAL DATA:  Angina for abnormal respirations. EXAM: PORTABLE CHEST 1 VIEW COMPARISON:  Chest x-ray 05/23/2020.  CT 05/23/2020, 05/22/2020. FINDINGS: Interim placement of pigtail chest tube, its tip is over the right mid chest. Superior most standard chest tube side hole is again noted in the chest wall soft tissues in unchanged position. Inferior-most standard chest tube in stable position over the right lower chest. Known pneumothoraces are difficult to visualized. Reference is made to chest CT report of 05/23/2020 and 05/22/2020. Extensive bilateral neck and chest wall subcutaneous emphysema is again noted.  Persistent right upper lung consolidation and bilateral interstitial prominence again noted. Heart size stable. IMPRESSION: 1. Interim placement of pigtail chest tube, its tip is over the right mid chest. 2. Superior most standard right chest tube side hole again noted in the chest wall soft tissues. Inferior-most standard right chest tube in stable position. Known pneumothoraces poorly visualized by chest x-ray, reference is made to prior CT reports of 05/23/2020 and 05/22/2020. Extensive bilateral neck and chest wall subcutaneous emphysema again noted. 3. Persistent right upper lung consolidation and bilateral interstitial changes again noted. Electronically Signed   By: Maisie Fus  Register   On: 05/24/2020 06:52   CT IMAGE GUIDED DRAINAGE BY PERCUTANEOUS CATHETER  Result Date: 05/23/2020 INDICATION: 59 year old female with massive subcutaneous emphysema and complicated right pneumothorax. Two chest tubes were placed surgically yesterday. Patient has a persistent anterior pneumothorax component and presents for placement of an image guided thoracostomy tube into this location. EXAM: Chest tube placement with CT guidance MEDICATIONS: The patient is currently admitted to the hospital and receiving intravenous antibiotics. The antibiotics were administered within an appropriate time frame prior to the initiation of the procedure. ANESTHESIA/SEDATION: Fentanyl 50 mcg IV; Versed 1 mg IV Moderate Sedation Time:  23 minutes The patient was continuously monitored during the procedure by the interventional radiology nurse under my direct supervision. COMPLICATIONS: None immediate. PROCEDURE: Informed written consent was obtained from the patient after a thorough discussion of the procedural risks, benefits and alternatives. All questions were addressed. Maximal Sterile Barrier Technique was utilized including caps, mask, sterile gowns, sterile gloves, sterile drape, hand hygiene and skin antiseptic. A timeout was  performed prior to the initiation of the procedure. A planning axial CT scan was performed. The anterior pneumothorax was successfully identified. A suitable skin entry site was selected and marked. The skin was sterilely prepped and draped in standard fashion using chlorhexidine skin prep. Local anesthesia was attained by infiltration with 1% lidocaine. A small dermatotomy was made. Under intermittent CT guidance, an 18 gauge trocar needle was advanced over a rib and into the pleural space in the region of the pneumothorax. A 0.035 wire was then coiled in the pleural space. The subcutaneous tract was dilated to 10 [place]. A Cook 10 [place] all-purpose drainage catheter was advanced over the wire and formed. The catheter was connected to low wall suction via a pleur-evac. The catheter was then secured to the skin with 0 Prolene suture and bandages were applied. Post placement CT imaging demonstrates a well-positioned chest tube with decreased anterior pneumothorax. A small amount of pneumothorax persists. IMPRESSION: Successful placement of 10 French pigtail thoracostomy tube into the loculated anterior pneumothorax. Electronically Signed   By: Malachy Moan M.D.   On: 05/23/2020 16:27    Labs:  CBC: Recent Labs    05/25/20 0158 05/25/20 2320 05/26/20 0219 05/27/20 0557  WBC 7.2 7.5 7.0 5.2  HGB 8.7* 8.4* 8.2* 8.6*  HCT 28.1*  27.4* 26.6* 27.5*  PLT 167 193 177 206    COAGS: Recent Labs    05/11/20 1135  INR 1.0  APTT 33    BMP: Recent Labs    05/24/20 0500 05/25/20 0158 05/26/20 0219 05/27/20 0557  NA 134* 135 136 137  K 3.9 4.1 3.8 3.8  CL 90* 92* 94* 98  CO2 33* 35* 33* 28  GLUCOSE 170* 133* 225* 118*  BUN 31* 31* 25* 20  CALCIUM 8.6* 8.6* 8.7* 8.7*  CREATININE 0.86 0.83 0.87 0.63  GFRNONAA >60 >60 >60 >60    LIVER FUNCTION TESTS: Recent Labs    05/18/20 0236 05/19/20 0159 05/20/20 0246 05/21/20 0045  BILITOT 0.6 0.8 0.8 0.8  AST 19 19 17  13*  ALT 14 15 15 14    ALKPHOS 60 58 58 60  PROT 5.0* 4.7* 4.7* 5.2*  ALBUMIN 1.7* 1.7* 1.8* 1.9*    Assessment and Plan: Subcutaneous emphysema s/p CT guided chest tube placement 12/9 by Dr. Patient assessed on the floor today.  Bloody output from IR chest tube, no air leak.  TCTS managing. Today's note reviewed, plan noted. IR available as needed.  Electronically Signed: 14/9, PA-C 05/27/2020, 11:12 AM   I spent a total of 15 Minutes at the the patient's bedside AND on the patient's hospital floor or unit, greater than 50% of which was counseling/coordinating care for subcutaneous emphysema.

## 2020-05-28 LAB — CBC
HCT: 30.8 % — ABNORMAL LOW (ref 36.0–46.0)
Hemoglobin: 9.4 g/dL — ABNORMAL LOW (ref 12.0–15.0)
MCH: 28.5 pg (ref 26.0–34.0)
MCHC: 30.5 g/dL (ref 30.0–36.0)
MCV: 93.3 fL (ref 80.0–100.0)
Platelets: 215 10*3/uL (ref 150–400)
RBC: 3.3 MIL/uL — ABNORMAL LOW (ref 3.87–5.11)
RDW: 17.9 % — ABNORMAL HIGH (ref 11.5–15.5)
WBC: 7.1 10*3/uL (ref 4.0–10.5)
nRBC: 0 % (ref 0.0–0.2)

## 2020-05-28 LAB — BASIC METABOLIC PANEL
Anion gap: 9 (ref 5–15)
BUN: 13 mg/dL (ref 6–20)
CO2: 30 mmol/L (ref 22–32)
Calcium: 9 mg/dL (ref 8.9–10.3)
Chloride: 97 mmol/L — ABNORMAL LOW (ref 98–111)
Creatinine, Ser: 0.6 mg/dL (ref 0.44–1.00)
GFR, Estimated: >60 mL/min (ref >60)
Glucose, Bld: 160 mg/dL — ABNORMAL HIGH (ref 70–99)
Potassium: 4.4 mmol/L (ref 3.5–5.1)
Sodium: 136 mmol/L (ref 135–145)

## 2020-05-28 LAB — GLUCOSE, CAPILLARY
Glucose-Capillary: 148 mg/dL — ABNORMAL HIGH (ref 70–99)
Glucose-Capillary: 153 mg/dL — ABNORMAL HIGH (ref 70–99)
Glucose-Capillary: 190 mg/dL — ABNORMAL HIGH (ref 70–99)
Glucose-Capillary: 239 mg/dL — ABNORMAL HIGH (ref 70–99)
Glucose-Capillary: 267 mg/dL — ABNORMAL HIGH (ref 70–99)
Glucose-Capillary: 278 mg/dL — ABNORMAL HIGH (ref 70–99)

## 2020-05-28 NOTE — Progress Notes (Signed)
PROGRESS NOTE    Annette [name]  MHD:[id number] DOB: 30-Jan-1961 DOA: 05/07/2020 PCP: Patient, No Pcp Per  Brief Narrative: 59/F w/ COPD, type 2 diabetes mellitus, Obesity, hypertension and dyslipidemia was admitted to [facility] on 11/19 with MRSA right upper lobe pneumonia, right lower lobe subsegmental pulmonary embolism she was treated with IV antibiotics and Lovenox for anticoagulation, subsequently developed right-sided hydropneumothorax on 11/22 underwent chest tube insertion and was transferred to [facility] for TCTS evaluation. -Upon arrival here, CT chest noted moderately large right pneumothorax, extensive subcutaneous emphysema throughout the right chest wall extending to the neck, had a chest tube placed on 11/24 -Then complicated by hemothorax, acute blood loss anemia, on 11/28 underwent evacuation of hematoma, mechanical pleurodesis and wound VAC placement -Subsequently on 12/3 underwent right chest wall wound closure and removal of wound VAC -Chest tubes removed on 12/ 6 after several days of minimal drainage without air leak -On 12/7 developed acute worsening severe subcutaneous emphysema extending to the neck and face, had another chest tube placed on the right by T CTS -Had persistent right pneumothorax and finally underwent another right chest tube placement in IR on 12/9 -Also seen and followed by palliative care this admission -Transferred from PCC[facility] to [facility] service 12/10 -Right superior chest tube was removed, the proximal port was outside the chest wall -Now slowly improving, has 2 chest tubes to suction  Assessment & Plan:   Complicated pneumothorax Extensive subcutaneous emphysema MRSA pneumonia -Completed antibiotic therapy -Initial chest tube placed at outside hospital on 11/24 followed by repeat chest tube placement on 11/28, both chest tubes were removed on 12/6 with acute worsening subcutaneous emphysema and pneumothorax -Repeat chest tube  placement on 12/7, and another in IR on 12/9 -Superior most chest tube which was dislodged and contributing to subcutaneous emphysema was removed 12/10 -Now clinically appears stable, both chest tubes remain to suction, repeat chest x-ray without pneumothorax  -Continue management per T CTS  -Started low-dose Lovenox for DVT prophylaxis   Recent subsegmental pulmonary embolism -CT at outside hospital prior on admission noted right subsegmental PE in addition pneumonia, was initially on full dose Lovenox, then developed acute blood loss anemia and massive chest wall hematoma which required surgical evacuation -Echocardiogram did not note any right heart strain -Anticoagulation subsequently had been on hold, no overt bleeding at this time -Restart anticoagulation when appropriate per TCTS -Started Lovenox for DVT prophylaxis on 12/12  Acute blood loss anemia -In the setting of hemothorax and intermittent bleeding on anticoagulation -Received 4 units of PRBC this admission -Hemoglobin stable now, monitor  COPD -continue nebs -wean O2 as tolerated  Type 2 diabetes mellitus -CBGs more stable, continue current dose of Lantus and sliding scale  Obesity -BMI 33.5   DVT prophylaxis: Lovenox Code Status: Full code Family Communication: No family at bedside Disposition Plan:  Status is: Inpatient  Remains inpatient appropriate because:Inpatient level of care appropriate due to severity of illness -PER TCTS   Dispo: The patient is from: Home              Anticipated d/c is to: Home              Anticipated d/c date is: > 3 days              Patient currently is not medically stable to d/c.  Consultants:   See CTS   Procedures: Multiple procedures as outlined my note  Antimicrobials:    Subjective: -Feels okay overall, breathing  improving, only mild discomfort at the chest tube site  Objective: Vitals:   05/27/20 2300 05/28/20 0010 05/28/20 0737 05/28/20 0909  BP: 113/86  113/86 122/64   Pulse: 95 96 92   Resp: (!) 28 20 (!) 23   Temp: 97.9 F (36.6 C) 97.9 F (36.6 C) 98.8 F (37.1 C)   TempSrc: Oral  Oral   SpO2: 95% 95% 93% 93%  Weight:   100.9 kg   Height:       No intake or output data in the 24 hours ending 05/28/20 1054 Filed Weights   05/24/20 0430 05/25/20 0400 05/28/20 0737  Weight: 100.2 kg 100.1 kg 100.9 kg    Examination:  General exam: Morbidly obese pleasant female sitting up in bed, AAOx3, no distress HEENT: Neck obese with subcutaneous emphysema in the upper chest and neck CVS: S1-S2, distant heart sounds Lungs: Extensive subcutaneous emphysema across the upper chest wall, improving, to right lateral chest tubes to suction, poor air movement bilaterally Abdomen: Obese, soft, nontender, bowel sounds present Extremities: No edema  Psychiatry:  Mood & affect appropriate.     Data Reviewed:   CBC: Recent Labs  Lab 05/22/20 0721 05/23/20 0310 05/24/20 0500 05/25/20 0158 05/25/20 2320 05/26/20 0219 05/27/20 0557 05/28/20 0925  WBC 5.2 5.1 6.7 7.2 7.5 7.0 5.2 7.1  NEUTROABS 3.2 2.9 4.6 4.9  --  4.5  --   --   HGB 9.3* 9.7* 9.0* 8.7* 8.4* 8.2* 8.6* 9.4*  HCT 29.9* 31.2* 29.6* 28.1* 27.4* 26.6* 27.5* 30.8*  MCV 93.4 93.4 94.9 94.6 94.2 94.3 92.3 93.3  PLT 144* 158 206 167 193 177 206 215   Basic Metabolic Panel: Recent Labs  Lab 05/22/20 0721 05/23/20 0310 05/23/20 1259 05/24/20 0500 05/25/20 0158 05/26/20 0219 05/27/20 0557 05/28/20 0925  NA  --   --    < > 134* 135 136 137 136  K  --   --    < > 3.9 4.1 3.8 3.8 4.4  CL  --   --    < > 90* 92* 94* 98 97*  CO2  --   --    < > 33* 35* 33* 28 30  GLUCOSE  --   --    < > 170* 133* 225* 118* 160*  BUN  --   --    < > 31* 31* 25* 20 13  CREATININE  --   --    < > 0.86 0.83 0.87 0.63 0.60  CALCIUM  --   --    < > 8.6* 8.6* 8.7* 8.7* 9.0  MG 1.8 1.9  --  2.0  --   --   --   --   PHOS 4.5 5.6*  --  3.8  --   --   --   --    < > = values in this interval not  displayed.   GFR: Estimated Creatinine Clearance: 92.9 mL/min (by C-G formula based on SCr of 0.6 mg/dL). Liver Function Tests: No results for input(s): AST, ALT, ALKPHOS, BILITOT, PROT, ALBUMIN in the last 168 hours. No results for input(s): LIPASE, AMYLASE in the last 168 hours. No results for input(s): AMMONIA in the last 168 hours. Coagulation Profile: No results for input(s): INR, PROTIME in the last 168 hours. Cardiac Enzymes: No results for input(s): CKTOTAL, CKMB, CKMBINDEX, TROPONINI in the last 168 hours. BNP (last 3 results) No results for input(s): PROBNP in the last 8760 hours. HbA1C: No results for input(s): HGBA1C  in the last 72 hours. CBG: Recent Labs  Lab 05/27/20 1745 05/27/20 2043 05/28/20 0008 05/28/20 0430 05/28/20 0740  GLUCAP 226* 235* 190* 148* 153*   Lipid Profile: No results for input(s): CHOL, HDL, LDLCALC, TRIG, CHOLHDL, LDLDIRECT in the last 72 hours. Thyroid Function Tests: No results for input(s): TSH, T4TOTAL, FREET4, T3FREE, THYROIDAB in the last 72 hours. Anemia Panel: No results for input(s): VITAMINB12, FOLATE, FERRITIN, TIBC, IRON, RETICCTPCT in the last 72 hours. Urine analysis: No results found for: COLORURINE, APPEARANCEUR, LABSPEC, PHURINE, GLUCOSEU, HGBUR, BILIRUBINUR, KETONESUR, PROTEINUR, UROBILINOGEN, NITRITE, LEUKOCYTESUR Sepsis Labs: @LABRCNTIP (procalcitonin:4,lacticidven:4)  ) Recent Results (from the past 240 hour(s))  SARS Coronavirus 2 by RT PCR (hospital order, performed in [facility] hospital lab) Nasopharyngeal Nasopharyngeal Swab     Status: None   Collection Time: 05/20/20  3:51 PM   Specimen: Nasopharyngeal Swab  Result Value Ref Range Status   SARS Coronavirus 2 NEGATIVE NEGATIVE Final    Comment: (NOTE) SARS-CoV-2 target nucleic acids are NOT DETECTED.  The SARS-CoV-2 RNA is generally detectable in upper and lower respiratory specimens during the acute phase of infection. The lowest concentration of  SARS-CoV-2 viral copies this assay can detect is 250 copies / mL. A negative result does not preclude SARS-CoV-2 infection and should not be used as the sole basis for treatment or other patient management decisions.  A negative result may occur with improper specimen collection / handling, submission of specimen other than nasopharyngeal swab, presence of viral mutation(s) within the areas targeted by this assay, and inadequate number of viral copies (<250 copies / mL). A negative result must be combined with clinical observations, patient history, and epidemiological information.  Fact Sheet for Patients:   14/06/21  Fact Sheet for Healthcare Providers: BoilerBrush.com.cy  This test is not yet approved or  cleared by the https://pope.com/ FDA and has been authorized for detection and/or diagnosis of SARS-CoV-2 by FDA under an Emergency Use Authorization (EUA).  This EUA will remain in effect (meaning this test can be used) for the duration of the COVID-19 declaration under Section 564(b)(1) of the Act, 21 U.S.C. section 360bbb-3(b)(1), unless the authorization is terminated or revoked sooner.  Performed at [facility], [address] [place], [place]   Culture, blood (routine x 2)     Status: None (Preliminary result)   Collection Time: 05/25/20 11:30 PM   Specimen: BLOOD  Result Value Ref Range Status   Specimen Description BLOOD RIGHT ANTECUBITAL  Final   Special Requests   Final    BOTTLES DRAWN AEROBIC AND ANAEROBIC Blood Culture adequate volume   Culture   Final    NO GROWTH 2 DAYS Performed at [facility], [address], [place], [place]    Report Status PENDING  Incomplete  Culture, blood (routine x 2)     Status: None (Preliminary result)   Collection Time: 05/25/20 11:40 PM   Specimen: BLOOD RIGHT HAND  Result Value Ref Range Status   Specimen Description BLOOD RIGHT HAND   Final   Special Requests   Final    BOTTLES DRAWN AEROBIC AND ANAEROBIC Blood Culture adequate volume   Culture   Final    NO GROWTH 2 DAYS Performed at [facility], [address] [place], [place]    Report Status PENDING  Incomplete    Radiology Studies: DG Chest Port 1 View  Result Date: 05/27/2020 CLINICAL DATA:  Follow-up chest tube EXAM: PORTABLE CHEST  1 VIEW COMPARISON:  05/26/2020 FINDINGS: Cardiac shadow is stable. Right-sided chest tubes are again identified and stable. Diffuse subcutaneous emphysema is again identified bilaterally. Persistent airspace opacity is noted in the right apex. No definitive pneumothorax is noted. No new focal abnormality is seen. IMPRESSION: Overall stable appearance of the chest without evidence of recurrent pneumothorax. Electronically Signed   By: Alcide CleverMark  Lukens M.D.   On: 05/27/2020 08:07    Scheduled Meds: . atorvastatin  40 mg Oral Daily  . bisacodyl  10 mg Oral Daily  . Chlorhexidine Gluconate Cloth  6 each Topical Daily  . cholecalciferol  1,000 Units Oral Daily  . clonazePAM  0.5 mg Oral BID  . enoxaparin (LOVENOX) injection  40 mg Subcutaneous Q24H  . fenofibrate  160 mg Oral Daily  . Gerhardt's butt cream   Topical BID  . guaiFENesin  600 mg Oral BID  . insulin aspart  0-15 Units Subcutaneous Q4H  . insulin glargine  25 Units Subcutaneous QHS  . lidocaine  1 patch Transdermal Q24H  . multivitamin with minerals  1 tablet Oral Daily  . nystatin   Topical BID  . pregabalin  150 mg Oral TID  . senna-docusate  1 tablet Oral QHS  . sodium chloride flush  10-40 mL Intracatheter Q12H  . umeclidinium bromide  1 puff Inhalation Daily   Continuous Infusions:   LOS: 21 days    Time spent: 25min  Zannie CovePreetha Aleiya Rye, MD [facility]  05/28/2020, 10:54 AM

## 2020-05-28 NOTE — Progress Notes (Addendum)
[address]       [place] [address]             [phone]       11 Days Post-Op Procedure(s) (LRB): RIGHT CHEST WALL WOUND CLOSURE REMOVAL OF WOUND VAC (Right) Subjective: Awake and alert, says she continues to feel better. She walked a short distance in her room yesterday.   Objective: Vital signs in last 24 hours: Temp:  [97.9 F (36.6 C)-98.8 F (37.1 C)] 98.8 F (37.1 C) (12/14 0737) Pulse Rate:  [91-102] 92 (12/14 0737) Cardiac Rhythm: Normal sinus rhythm (12/14 0704) Resp:  [17-28] 23 (12/14 0737) BP: (99-127)/(56-86) 122/64 (12/14 0737) SpO2:  [92 %-97 %] 93 % (12/14 0737)  Hemodynamic parameters for last 24 hours:    Intake/Output from previous day: 12/13 0701 - 12/14 0700 In: 480 [P.O.:480] Out: -  Intake/Output this shift: No intake/output data recorded.  General appearance: alert, cooperative and no distress Heart: regular rate and rhythm Lungs:crackles bilat from subQ air. SubQ air continues to recede. No air leak from either chest tube. Serous drainage.  Abdomen: benign Wound: incisions without signs of infection   Lab Results: Recent Labs    05/26/20 0219 05/27/20 0557  WBC 7.0 5.2  HGB 8.2* 8.6*  HCT 26.6* 27.5*  PLT 177 206   BMET:  Recent Labs    05/26/20 0219 05/27/20 0557  NA 136 137  K 3.8 3.8  CL 94* 98  CO2 33* 28  GLUCOSE 225* 118*  BUN 25* 20  CREATININE 0.87 0.63  CALCIUM 8.7* 8.7*    PT/INR: No results for input(s): LABPROT, INR in the last 72 hours. ABG    Component Value Date/Time   PHART 7.400 05/12/2020 1524   HCO3 36.1 (H) 05/12/2020 1524   TCO2 38 (H) 05/12/2020 1524   O2SAT 99.0 05/12/2020 1524   CBG (last 3)  Recent Labs    05/28/20 0008 05/28/20 0430 05/28/20 0740  GLUCAP 190* 148* 153*    Meds Scheduled Meds: . atorvastatin  40 mg Oral Daily  . bisacodyl  10 mg Oral Daily  . Chlorhexidine Gluconate Cloth  6 each Topical Daily  . cholecalciferol  1,000 Units Oral  Daily  . clonazePAM  0.5 mg Oral BID  . enoxaparin (LOVENOX) injection  40 mg Subcutaneous Q24H  . fenofibrate  160 mg Oral Daily  . Gerhardt's butt cream   Topical BID  . guaiFENesin  600 mg Oral BID  . insulin aspart  0-15 Units Subcutaneous Q4H  . insulin glargine  25 Units Subcutaneous QHS  . lidocaine  1 patch Transdermal Q24H  . multivitamin with minerals  1 tablet Oral Daily  . nystatin   Topical BID  . pregabalin  150 mg Oral TID  . senna-docusate  1 tablet Oral QHS  . sodium chloride flush  10-40 mL Intracatheter Q12H  . umeclidinium bromide  1 puff Inhalation Daily   Continuous Infusions: PRN Meds:.acetaminophen, HYDROmorphone (DILAUDID) injection, influenza vac split quadrivalent PF, levalbuterol, magnesium citrate, ondansetron (ZOFRAN) IV, oxyCODONE, pneumococcal 23 valent vaccine, polyethylene glycol, sodium chloride flush, traMADol  Xrays DG Chest Port 1 View  Result Date: 05/27/2020 CLINICAL DATA:  Follow-up chest tube EXAM: PORTABLE CHEST 1 VIEW COMPARISON:  05/26/2020 FINDINGS: Cardiac shadow is stable. Right-sided chest tubes are again identified and stable. Diffuse subcutaneous emphysema is again identified bilaterally. Persistent airspace opacity is noted in the right apex. No definitive pneumothorax is noted. No new focal abnormality  is seen. IMPRESSION: Overall stable appearance of the chest without evidence of recurrent pneumothorax. Electronically Signed   By: Alcide Clever M.D.   On: 05/27/2020 08:07    Assessment/Plan: S/P Procedure(s) (LRB): RIGHT CHEST WALL WOUND CLOSURE REMOVAL OF WOUND VAC (Right)  1 afeb, VSS 2 sats ok , on 4L [place] 3 No CXR today, will order for tomorrow.  no air leak- keep CT's in place, 100 cc drainage yesterday- will need CT for a significant time frame per Dr Vickey Sages- could potentially d/c with Pneumostat (one way valve/fluid collection device) when closer to d/c    LOS: 21 days    Leary Roca PA-C Pager [phone]  [phone] 05/28/2020 Pt seen and examined; agree with documentation and plan. Suggest chest tube to water seal tomorrow then transition to Heimlich valve soon if lung stays up. Felton Buczynski Z. Vickey Sages, MD [phone]

## 2020-05-29 ENCOUNTER — Inpatient Hospital Stay (HOSPITAL_COMMUNITY): Payer: Medicare Other

## 2020-05-29 LAB — BASIC METABOLIC PANEL
Anion gap: 8 (ref 5–15)
BUN: 20 mg/dL (ref 6–20)
CO2: 28 mmol/L (ref 22–32)
Calcium: 8.5 mg/dL — ABNORMAL LOW (ref 8.9–10.3)
Chloride: 100 mmol/L (ref 98–111)
Creatinine, Ser: 0.93 mg/dL (ref 0.44–1.00)
GFR, Estimated: >60 mL/min (ref >60)
Glucose, Bld: 226 mg/dL — ABNORMAL HIGH (ref 70–99)
Potassium: 4.3 mmol/L (ref 3.5–5.1)
Sodium: 136 mmol/L (ref 135–145)

## 2020-05-29 LAB — CBC
HCT: 27.1 % — ABNORMAL LOW (ref 36.0–46.0)
Hemoglobin: 8.6 g/dL — ABNORMAL LOW (ref 12.0–15.0)
MCH: 29.5 pg (ref 26.0–34.0)
MCHC: 31.7 g/dL (ref 30.0–36.0)
MCV: 92.8 fL (ref 80.0–100.0)
Platelets: 187 10*3/uL (ref 150–400)
RBC: 2.92 MIL/uL — ABNORMAL LOW (ref 3.87–5.11)
RDW: 18 % — ABNORMAL HIGH (ref 11.5–15.5)
WBC: 6.3 10*3/uL (ref 4.0–10.5)
nRBC: 0 % (ref 0.0–0.2)

## 2020-05-29 LAB — GLUCOSE, CAPILLARY
Glucose-Capillary: 163 mg/dL — ABNORMAL HIGH (ref 70–99)
Glucose-Capillary: 203 mg/dL — ABNORMAL HIGH (ref 70–99)
Glucose-Capillary: 208 mg/dL — ABNORMAL HIGH (ref 70–99)
Glucose-Capillary: 214 mg/dL — ABNORMAL HIGH (ref 70–99)
Glucose-Capillary: 217 mg/dL — ABNORMAL HIGH (ref 70–99)
Glucose-Capillary: 226 mg/dL — ABNORMAL HIGH (ref 70–99)
Glucose-Capillary: 293 mg/dL — ABNORMAL HIGH (ref 70–99)
Glucose-Capillary: >600 mg/dL (ref 70–99)

## 2020-05-29 MED ORDER — INSULIN ASPART 100 UNIT/ML ~~LOC~~ SOLN
3.0000 [IU] | Freq: Three times a day (TID) | SUBCUTANEOUS | Status: DC
  Administered 2020-05-29 – 2020-06-01 (×9): 3 [IU] via SUBCUTANEOUS

## 2020-05-29 MED ORDER — INSULIN ASPART 100 UNIT/ML ~~LOC~~ SOLN: 0.0000 [IU] | Freq: Every day | SUBCUTANEOUS | Status: DC

## 2020-05-29 MED ORDER — HYDROMORPHONE HCL 1 MG/ML IJ SOLN
0.5000 mg | Freq: Once | INTRAMUSCULAR | Status: AC
Start: 2020-05-29 — End: 2020-05-29
  Administered 2020-05-29: 0.5 mg via INTRAVENOUS

## 2020-05-29 MED ORDER — INSULIN ASPART 100 UNIT/ML ~~LOC~~ SOLN
0.0000 [IU] | Freq: Three times a day (TID) | SUBCUTANEOUS | Status: DC
  Administered 2020-05-29: 8 [IU] via SUBCUTANEOUS
  Administered 2020-05-30: 5 [IU] via SUBCUTANEOUS
  Administered 2020-05-30: 8 [IU] via SUBCUTANEOUS
  Administered 2020-05-30 – 2020-05-31 (×2): 3 [IU] via SUBCUTANEOUS
  Administered 2020-05-31: 5 [IU] via SUBCUTANEOUS
  Administered 2020-06-01: 8 [IU] via SUBCUTANEOUS
  Administered 2020-06-01: 3 [IU] via SUBCUTANEOUS
  Administered 2020-06-01: 5 [IU] via SUBCUTANEOUS
  Administered 2020-06-02: 3 [IU] via SUBCUTANEOUS
  Administered 2020-06-02: 11 [IU] via SUBCUTANEOUS
  Administered 2020-06-02: 5 [IU] via SUBCUTANEOUS
  Administered 2020-06-03: 3 [IU] via SUBCUTANEOUS
  Administered 2020-06-03: 8 [IU] via SUBCUTANEOUS

## 2020-05-29 NOTE — Progress Notes (Signed)
Nutrition Follow-up  DOCUMENTATION CODES:   Obesity unspecified  INTERVENTION:    Snacks BID  Double protein with meals   MVI with minerals daily  NUTRITION DIAGNOSIS:   Increased nutrient needs related to wound healing,acute illness (recent COVID PNA) as evidenced by estimated needs.  Ongoing  GOAL:   Patient will meet greater than or equal to 90% of their needs   Progressing  MONITOR:   PO intake,Supplement acceptance,Skin  REASON FOR ASSESSMENT:   Malnutrition Screening Tool    ASSESSMENT:   59 yo female admitted from [facility] for treatment of a complicated pneumothorax in the setting of MRSA PNA. PMH includes COVID PNA 2 months PTA, recent PE, COPD, HTN, DM, asthma.   11/28- VATS, thoracotomy R pleurodesis, evacuation hematoma 12/08- new R PTX, chest tube placed   One chest tube removed on 12/10. Plan to removed large bore chest tube today. Facial and neck swelling significantly decreased.   Appetite remains stable. Last eight meal completions charted as 100%. RD continued to encourage high protein food options.   Admission weight: 117.9 kg  Current weight:103.3 kg   Medications: dulcolax, Vit D, SS novolog, lantus, senokot Labs: CBG [id number]  Diet Order:   Diet Order            Diet 2 gram sodium Room service appropriate? Yes; Fluid consistency: Thin  Diet effective now           Diet - low sodium heart healthy                 EDUCATION NEEDS:   Not appropriate for education at this time  Skin:  Skin Assessment: Skin Integrity Issues: Skin Integrity Issues:: Stage II,Incisions Stage II: coccyx Incisions: R chest x3  Last BM:  12/14  Height:   Ht Readings from Last 1 Encounters:  05/17/20 5\' 8"  (1.727 m)    Weight:   Wt Readings from Last 1 Encounters:  05/29/20 114.2 kg    Ideal Body Weight:  63.6 kg  BMI:  Body mass index is 38.27 kg/m.  Estimated Nutritional Needs:   Kcal:  2300-2500  Protein:   125-150 gm  Fluid:  >/= 2 L  05/31/20 RD, LDN Clinical Nutrition Pager listed in AMION

## 2020-05-29 NOTE — Progress Notes (Signed)
Occupational Therapy Treatment Patient Details Name: Annette [name] MRN: [id number] DOB: Nov 05, 1960 Today's Date: 05/29/2020    History of present illness 59 yo female admitted to [facility] on 05/03/2020 with R upper lobe MRSA pneumonia and R lower lobe subsegmental PE. Developed right-sided hydropneumothorax on 11/22 underwent chest tube insertion and on 05/06/2020 transfered to [facility] for CTS evaluation and treatment. Additional R chest tube placement 11/24.  On 11/28, patient underwent VATS/thoracotomy mechanical pleurodesis and wound VAC placement. 3rd chest tube to be placed 12/9.  PMH including Covid pneumonia approximately 2 months ago, recent PE on Eliquis, COPD, asthma, depression, hypertension, and diabetes.   OT comments  Pt making very limited, slow progress towards acute OT goals. Needed max A +2 for safety to come from supine to EOB, max A +2 for physical assist to stand 2x from EOB (significant trunk flexed position which pt reports is normal for her). Only able to maintain standing about 15 seconds before initiating sitting down. Pt clearly needs significant assist for basic ADLs and insists she can manage at home with family assistance. No family present to confirm. Added hoyer lift and hospital bed to equipment recommendations. Pt reports she has a standard w/c at home. Will need to maximize [facility] services if pt continues to decline SNF recommendation. Unable to progress past sit<>stand this session due to fatigue/swimmy-headed sitting upright.    Follow Up Recommendations  SNF    Equipment Recommendations  3 in 1 bedside commode;Hospital bed;Other (comment) Michiel Sites lift)    Recommendations for Other Services      Precautions / Restrictions Precautions Precautions: Fall Precaution Comments: 2 chest tubes R Restrictions Weight Bearing Restrictions: No       Mobility Bed Mobility Overal bed mobility: Needs Assistance Bed Mobility: Supine to Sit;Sit to  Supine;Rolling Rolling: Mod assist   Supine to sit: Mod assist;HOB elevated;+2 for safety/equipment Sit to supine: Mod assist;HOB elevated   General bed mobility comments: Assist to unweight BLE, pivot hips to EOB position and for powering up trunk. Able to scoot hips forward utilizing BUE once EOB.  Transfers Overall transfer level: Needs assistance Equipment used: 4-wheeled walker Transfers: Sit to/from Stand Sit to Stand: Max assist;+2 physical assistance;From elevated surface         General transfer comment: 2x max A to stand from elevated bed height. Fatgiues quickly in standing (about 15 seconds). Pt declined third trial of standing and requesting back to bed.    Balance Overall balance assessment: Needs assistance Sitting-balance support: Bilateral upper extremity supported;Feet supported Sitting balance-Leahy Scale: Poor Sitting balance - Comments: bilat UE support needed to maintain sitting balance   Standing balance support: Bilateral upper extremity supported Standing balance-Leahy Scale: Poor Standing balance comment: pt required mod assist +2 and bilat UE support to maintain standing balance. Significant trunk flexed position and L arm (not hand) on rollator handle- pt reports both these present at baseline.                           ADL either performed or assessed with clinical judgement   ADL Overall ADL's : Needs assistance/impaired     Grooming: Set up;Minimal assistance Grooming Details (indicate cue type and reason): supported sitting vs min A for external support during bilateral tasks                               General ADL  Comments: Pt sat EOB with single to bilateral UE support for about 10 minutes. Briefly Stood 2x with rollator and steadying assist. Max +2 assist for sit<>stand. Max A for bed mobility (+2 to come from supine to EOB.     Vision       Perception     Praxis      Cognition Arousal/Alertness:  Awake/alert Behavior During Therapy: WFL for tasks assessed/performed Overall Cognitive Status: No family/caregiver present to determine baseline cognitive functioning Area of Impairment: Safety/judgement                         Safety/Judgement: Decreased awareness of safety;Decreased awareness of deficits     General Comments: Pt remains needing significant +2 assist for bed mobility and to stand briefly yet insists she will be able to move better once she's home and able to walk "in my own environment." "I have carpet at home."        Exercises Other Exercises Other Exercises: Discussed bed level general UB/LB strengthening exercies and frequency.   Shoulder Instructions       General Comments      Pertinent Vitals/ Pain       Pain Assessment: Faces Faces Pain Scale: Hurts little more Pain Location: site of chest tubes, R flank Pain Descriptors / Indicators: Discomfort;Grimacing;Tender Pain Intervention(s): Monitored during session;Limited activity within patient's tolerance;Repositioned;Premedicated before session  Home Living                                          Prior Functioning/Environment              Frequency  Min 2X/week        Progress Toward Goals  OT Goals(current goals can now be found in the care plan section)  Progress towards OT goals: Not progressing toward goals - comment (slow, limited progress)  Acute Rehab OT Goals Patient Stated Goal: go home OT Goal Formulation: With patient Time For Goal Achievement: 06/12/20 Potential to Achieve Goals: Fair ADL Goals Pt Will Perform Grooming: sitting;with modified independence Pt Will Perform Upper Body Dressing: with modified independence;sitting Pt Will Perform Lower Body Dressing: with min assist;with adaptive equipment;sit to/from stand;with caregiver independent in assisting Pt Will Transfer to Toilet: with min assist;stand pivot transfer;bedside commode Pt  Will Perform Toileting - Clothing Manipulation and hygiene: with min guard assist;sitting/lateral leans;sit to/from stand Additional ADL Goal #1: Pt will independently verbalize three energy conservation techniques for ADLs  Plan Discharge plan remains appropriate    Co-evaluation    PT/OT/SLP Co-Evaluation/Treatment: Yes Reason for Co-Treatment: For patient/therapist safety;To address functional/ADL transfers;Complexity of the patient's impairments (multi-system involvement)   OT goals addressed during session: ADL's and self-care;Strengthening/ROM      AM-PAC OT "6 Clicks" Daily Activity     Outcome Measure   Help from another person eating meals?: None Help from another person taking care of personal grooming?: A Little Help from another person toileting, which includes using toliet, bedpan, or urinal?: Total Help from another person bathing (including washing, rinsing, drying)?: A Lot Help from another person to put on and taking off regular upper body clothing?: A Lot Help from another person to put on and taking off regular lower body clothing?: Total 6 Click Score: 13    End of Session Equipment Utilized During Treatment: Oxygen  OT Visit Diagnosis: Unsteadiness on feet (  R26.81);Other abnormalities of gait and mobility (R26.89);Muscle weakness (generalized) (M62.81)   Activity Tolerance Patient limited by fatigue;Other (comment) (swimmy-headed on initial sitting EOB and standing)   Patient Left in bed;with call bell/phone within reach   Nurse Communication          Time: 6190-1222 OT Time Calculation (min): 36 min  Charges: OT General Charges $OT Visit: 1 Visit OT Treatments $Self Care/Home Management : 8-22 mins  Raynald Kemp, OT Acute Rehabilitation Services Pager: [phone] Office: [phone]    Pilar Grammes 05/29/2020, 12:01 PM

## 2020-05-29 NOTE — Progress Notes (Signed)
Physical Therapy Treatment Patient Details Name: Annette [name] MRN: [id number] DOB: December 31, 1960 Today's Date: 05/29/2020    History of Present Illness 59 yo female admitted to [facility] on 05/03/2020 with R upper lobe MRSA pneumonia and R lower lobe subsegmental PE. Developed right-sided hydropneumothorax on 11/22 underwent chest tube insertion and on 05/06/2020 transfered to [facility] for CTS evaluation and treatment. Additional R chest tube placement 11/24.  On 11/28, patient underwent VATS/thoracotomy mechanical pleurodesis and wound VAC placement. 3rd chest tube to be placed 12/9.  PMH including Covid pneumonia approximately 2 months ago, recent PE on Eliquis, COPD, asthma, depression, hypertension, and diabetes.    PT Comments    Pt was seen for two person progression of her mobility with PT and OT working together to coordinate UE and LE use for sequencing bed mob and transfers.  Her plan is to get up to chair and show how to do sliding transition since her plan is to forgo the recommendation of rehab to go home with family.  Pt is not voicing clear instructions and responses on how to safely navigate home but will expect her to show a sliding transfer preferably with her home caregivers.  Follow for goals of PT.   Follow Up Recommendations  SNF     Equipment Recommendations  3in1 (PT);Hospital bed;Other (comment)    Recommendations for Other Services       Precautions / Restrictions Precautions Precautions: Fall Precaution Comments: 2 chest tubes R Restrictions Weight Bearing Restrictions: No    Mobility  Bed Mobility Overal bed mobility: Needs Assistance Bed Mobility: Supine to Sit;Sit to Supine Rolling: Mod assist;+2 for physical assistance;+2 for safety/equipment   Supine to sit: Mod assist Sit to supine: +2 for physical assistance;+2 for safety/equipment   General bed mobility comments: Help to move trunk with protection of chest tubes, then help to  get legs onto bed.  Requires mult repositioning attempts  Transfers Overall transfer level: Needs assistance Equipment used: Rolling walker (2 wheeled) Transfers: Sit to/from Stand Sit to Stand: Max assist;+2 physical assistance;+2 safety/equipment         General transfer comment: 2 max and still stands with flexion from the hips.  Ambulation/Gait             General Gait Details: declines to attempt   Stairs             Wheelchair Mobility    Modified Rankin (Stroke Patients Only)       Balance Overall balance assessment: Needs assistance Sitting-balance support: Feet supported;Bilateral upper extremity supported Sitting balance-Leahy Scale: Poor Sitting balance - Comments: bilat UE support needed to maintain sitting balance   Standing balance support: Bilateral upper extremity supported Standing balance-Leahy Scale: Poor                              Cognition Arousal/Alertness: Awake/alert Behavior During Therapy: WFL for tasks assessed/performed Overall Cognitive Status: No family/caregiver present to determine baseline cognitive functioning Area of Impairment: Safety/judgement                         Safety/Judgement: Decreased awareness of safety;Decreased awareness of deficits     General Comments: unrealistic expectation that her dependent mobility will change once she is home, not aware of how to manage but feels family will cover it      Exercises Other Exercises Other Exercises: discussion about ankle ROM and  quads sets    General Comments General comments (skin integrity, edema, etc.): Pt is afraid to stand and try to step, very driven by her fears to decide how much to challenge herself      Pertinent Vitals/Pain Pain Assessment: Faces Faces Pain Scale: Hurts little more Pain Location: Surgery sites Pain Descriptors / Indicators: Guarding;Grimacing Pain Intervention(s): Limited activity within patient's  tolerance;Monitored during session;Premedicated before session;Repositioned    Home Living                      Prior Function            PT Goals (current goals can now be found in the care plan section) Acute Rehab PT Goals Patient Stated Goal: get home Progress towards PT goals: Progressing toward goals    Frequency    Min 3X/week      PT Plan Current plan remains appropriate    Co-evaluation   Reason for Co-Treatment: Complexity of the patient's impairments (multi-system involvement);For patient/therapist safety PT goals addressed during session: Mobility/safety with mobility;Balance;Proper use of DME        AM-PAC PT "6 Clicks" Mobility   Outcome Measure  Help needed turning from your back to your side while in a flat bed without using bedrails?: A Lot Help needed moving from lying on your back to sitting on the side of a flat bed without using bedrails?: A Lot Help needed moving to and from a bed to a chair (including a wheelchair)?: A Lot Help needed standing up from a chair using your arms (e.g., wheelchair or bedside chair)?: Total Help needed to walk in hospital room?: Total Help needed climbing 3-5 steps with a railing? : Total 6 Click Score: 9    End of Session Equipment Utilized During Treatment: Oxygen;Gait belt Activity Tolerance: Patient limited by fatigue Patient left: with call bell/phone within reach;in bed;with bed alarm set Nurse Communication: Mobility status PT Visit Diagnosis: Muscle weakness (generalized) (M62.81)     Time: 9417-4081 PT Time Calculation (min) (ACUTE ONLY): 32 min  Charges:  $Therapeutic Activity: 8-22 mins           Ivar Drape 05/29/2020, 4:01 PM  Samul Dada, PT MS Acute Rehab Dept. Number: [facility] [id number] and [facility] [phone]

## 2020-05-29 NOTE — Progress Notes (Signed)
[address]       [place] [address]             [phone]       12 Days Post-Op Procedure(s) (LRB): RIGHT CHEST WALL WOUND CLOSURE REMOVAL OF WOUND VAC (Right) Subjective: Awake and alert, says she continues to feel better. No new concerns.  Objective: Vital signs in last 24 hours: Temp:  [97.8 F (36.6 C)-99.5 F (37.5 C)] 98.7 F (37.1 C) (12/15 0716) Pulse Rate:  [88-110] 88 (12/15 0430) Cardiac Rhythm: Normal sinus rhythm (12/15 0704) Resp:  [20-22] 20 (12/15 0716) BP: (92-126)/(58-72) 92/64 (12/15 0430) SpO2:  [93 %-98 %] 98 % (12/15 0430) Weight:  [103.3 kg-114.2 kg] 114.2 kg (12/15 0650)     Intake/Output from previous day: 12/14 0701 - 12/15 0700 In: 1800 [P.O.:1800] Out: -  Intake/Output this shift: No intake/output data recorded.  General appearance: alert, cooperative and no distress Heart: regular rate and rhythm Lungs: SubQ air continues to recede. No air leak from either chest tube. Serous drainage in CT's. Volumes not recorded.  Abdomen: benign Wound: incisions without signs of infection   Lab Results: Recent Labs    05/28/20 0925 05/29/20 0145  WBC 7.1 6.3  HGB 9.4* 8.6*  HCT 30.8* 27.1*  PLT 215 187   BMET:  Recent Labs    05/28/20 0925 05/29/20 0145  NA 136 136  K 4.4 4.3  CL 97* 100  CO2 30 28  GLUCOSE 160* 226*  BUN 13 20  CREATININE 0.60 0.93  CALCIUM 9.0 8.5*    PT/INR: No results for input(s): LABPROT, INR in the last 72 hours. ABG    Component Value Date/Time   PHART 7.400 05/12/2020 1524   HCO3 36.1 (H) 05/12/2020 1524   TCO2 38 (H) 05/12/2020 1524   O2SAT 99.0 05/12/2020 1524   CBG (last 3)  Recent Labs    05/28/20 2358 05/29/20 0430 05/29/20 0725  GLUCAP 226* 208* 163*    Meds Scheduled Meds: . atorvastatin  40 mg Oral Daily  . bisacodyl  10 mg Oral Daily  . Chlorhexidine Gluconate Cloth  6 each Topical Daily  . cholecalciferol  1,000 Units Oral Daily  . clonazePAM  0.5 mg  Oral BID  . enoxaparin (LOVENOX) injection  40 mg Subcutaneous Q24H  . fenofibrate  160 mg Oral Daily  . Gerhardt's butt cream   Topical BID  . guaiFENesin  600 mg Oral BID  . insulin aspart  0-15 Units Subcutaneous Q4H  . insulin glargine  25 Units Subcutaneous QHS  . lidocaine  1 patch Transdermal Q24H  . multivitamin with minerals  1 tablet Oral Daily  . nystatin   Topical BID  . pregabalin  150 mg Oral TID  . senna-docusate  1 tablet Oral QHS  . sodium chloride flush  10-40 mL Intracatheter Q12H  . umeclidinium bromide  1 puff Inhalation Daily   Continuous Infusions: PRN Meds:.acetaminophen, HYDROmorphone (DILAUDID) injection, influenza vac split quadrivalent PF, levalbuterol, magnesium citrate, ondansetron (ZOFRAN) IV, oxyCODONE, pneumococcal 23 valent vaccine, polyethylene glycol, sodium chloride flush, traMADol  Xrays No results found.  Assessment/Plan: S/P Procedure(s) (LRB): RIGHT CHEST WALL WOUND CLOSURE REMOVAL OF WOUND VAC (Right)   -No PTX, no air leak from either tube. Plan to remove the large-bore CT today and place the pigtail catheter to water seal.  If no recurrence of the PTX or increase in subQ air, she will be able to discharge with the pigtail  catheter in place connected to a Pneumostat collection chamber.     LOS: 22 days    Leary Roca PA-C Pager [phone] 05/29/2020

## 2020-05-29 NOTE — Progress Notes (Signed)
Inpatient Diabetes Program Recommendations  AACE/ADA: New Consensus Statement on Inpatient Glycemic Control (2015)  Target Ranges:  Prepandial:   less than 140 mg/dL      Peak postprandial:   less than 180 mg/dL (1-2 hours)      Critically ill patients:  140 - 180 mg/dL   Lab Results  Component Value Date   GLUCAP 163 (H) 05/29/2020   HGBA1C 10.7 (H) 05/08/2020    Review of Glycemic Control Results for PIETRA, ZULUAGA (MRN [id number]) as of 05/29/2020 11:17  Ref. Range 05/28/2020 07:40 05/28/2020 11:31 05/28/2020 16:06 05/28/2020 20:11 05/28/2020 23:58 05/29/2020 04:30 05/29/2020 07:25  Glucose-Capillary Latest Ref Range: 70 - 99 mg/dL 937 (H) 169 (H) 678 (H) 239 (H) 226 (H) 208 (H) 163 (H)    Inpatient Diabetes Program Recommendations:     -Novolog 0-15 units TID as patient is eating -Novolog 3 units TID with meals if eats at least 50% -Carb modified diet  Will continue to follow while inpatient.  Thank you, Dulce Sellar, RN, BSN Diabetes Coordinator Inpatient Diabetes Program [phone] (team pager from 8a-5p)

## 2020-05-29 NOTE — Progress Notes (Signed)
PROGRESS NOTE    Lameshia Hypolite  ZWC:[id number] DOB: 12-07-1960 DOA: 05/07/2020 PCP: Patient, No Pcp Per    Brief Narrative:  The patient is a 59/F w/ COPD, type 2 diabetes mellitus, Obesity, hypertension and dyslipidemia was admitted to [facility] on 11/19 with MRSA right upper lobe pneumonia, right lower lobe subsegmental pulmonary embolism she was treated with IV antibiotics and Lovenox for anticoagulation, subsequently developed right-sided hydropneumothorax on 11/22 underwent chest tube insertion and was transferred to [facility] for TCTS evaluation. -Upon arrival here, CT chest noted moderately large right pneumothorax, extensive subcutaneous emphysema throughout the right chest wall extending to the neck, had a chest tube placed on 11/24 -Then complicated by hemothorax, acute blood loss anemia, on 11/28 underwent evacuation of hematoma, mechanical pleurodesis and wound VAC placement -Subsequently on 12/3 underwent right chest wall wound closure and removal of wound VAC -Chest tubes removed on 12/ 6 after several days of minimal drainage without air leak -On 12/7 developed acute worsening severe subcutaneous emphysema extending to the neck and face, had another chest tube placed on the right by T CTS -Had persistent right pneumothorax and finally underwent another right chest tube placement in IR on 12/9 -Also seen and followed by palliative care this admission -Transferred from PCCM to [facility] service 12/10 -Right superior chest tube was removed, the proximal port was outside the chest wall -Now slowly improving, has 2 chest tubes to suction and the plan by CT surgery on 12/15 is to have Large-Bore Chest Tube to be removed and place the pigtail catheter to water seal; cardiothoracic surgery feels that there is no recurrence of her pneumothorax or increase in her subcutaneous air she will be able to discharge with the pigtail catheter in place connected to the Pneumostat  collection chamber  Assessment & Plan:   Principal Problem:   MRSA pneumonia (HCC) Active Problems:   Pulmonary embolism (HCC)   Pneumothorax   Acute hypoxemic respiratory failure (HCC)   COPD (chronic obstructive pulmonary disease) (HCC)   Pressure injury of skin   Palliative care by specialist   Goals of care, counseling/discussion   DNR (do not resuscitate)  Complicated Pneumothorax Extensive subcutaneous emphysema MRSA pneumonia -Completed antibiotic therapy -Initial chest tube placed at outside hospital on 11/24 followed by repeat chest tube placement on 11/28, both chest tubes were removed on 12/6 with acute worsening subcutaneous emphysema and pneumothorax; She underwent VATS/Thoracotomy Mechanical Pleurodesis with Wound VAC Placement on 11/28 -Repeat chest tube placement on 12/7, and another in IR on 12/9 -Superior most chest tube which was dislodged and contributing to subcutaneous emphysema was removed 12/10 -Now clinically appears stable, both chest tubes remain to suction, repeat chest x-ray without pneumothorax  -Continue management per TCTS; the plan by CT surgery on 12/15 is to have Large-Bore Chest Tube to be removed and place the pigtail catheter to water seal; cardiothoracic surgery feels that there is no recurrence of her pneumothorax or increase in her subcutaneous air she will be able to discharge with the pigtail catheter in place connected to the Pneumostat collection chamber -C/w Pain Controlo with IV Hydromorphone 0.5 mg IV q4hprn Severe Pain and with Oxycodone 5-10 mg po q4hprn Moderate Pain and also has tramadol 50 to 100 mg p.o. every 6 as needed for mild pain -C/w Breathing treatments with Levalbeterol 0.63 mg Neb q6hprn Wheezing and C/w home Umeclidinium Bromide 1 puff IH Daily  -Started low-dose Lovenox for DVT prophylaxis  -C/w PT/OT and they are recommending SNF  but if refusing SNF will need Home Health Maximized -Repeat CXR this AM showed "Cardiac shadow  is stable. Diffuse subcutaneous emphysema is again seen but slightly decreased. Right-sided pigtail catheter is again noted. No pneumothorax is seen. Persistent opacity is noted in the right apex stable from the prior study. No new focal infiltrate is seen. No bony abnormality is noted." -Repeat CXR in the AM   Recent Subsegmental Pulmonary Embolism -CT at outside hospital prior on admission noted right subsegmental PE in addition pneumonia, was initially on full dose Lovenox, then developed acute blood loss anemia and massive chest wall hematoma which required surgical evacuation -SpO2: 98 % O2 Flow Rate (L/min): 4 L/min FiO2 (%): 55 % -Echocardiogram did not note any right heart strain -Anticoagulation subsequently had been on hold, no overt bleeding at this time -Restart anticoagulation when appropriate per TCTS -Started Lovenox for DVT prophylaxis on 12/12 for now and will defer to TCTS to resume full dose Anticoagulation   Acute Bood Loss Anemia -In the setting of hemothorax and intermittent bleeding on anticoagulation with Eliquis  -Received 4 units of PRBC this admission -Hemoglobin stable now at 8.6/27.1 -Continue to Monitor for S/Sx of Bleeding -Repeat CBC in the AM   COPD Hx of COVID PNA -continue nebs and inhalers as above -wean O2 as tolerated  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 twice daily  Constipation -Continue with bowel regimen continue with bisacodyl 10 mg p.o. daily, polyethylene glycol 17 g p.o. twice daily as needed for mild constipation, and senna docusate 1 tab p.o. nightly  HLD -C/w Atorvastatin 40 mg po Daily and Fenofibrate 160 mg po Daily   Obesity -Complicates  overall prognosis and Care -Estimated body mass index is 38.27 kg/m as calculated from the following:   Height as of this encounter:  (1.727 m).   Weight as of this encounter: 114.2 kg. -Weight Loss and Dietary Counseling given  -Nutritionist consulted and recommending Snacks BID, Double Protein with Meals and MVI with Minerals Daily  GOC: DNR, poA  DVT prophylaxis: Enoxaparin 40 mg sq q24h Code Status: Partial Code Family Communication: No family present at bedside  Disposition Plan: Need further clinical improvement and clearance by cardiothoracic surgery  Status is: Inpatient  Remains inpatient appropriate because:Unsafe d/c plan, IV treatments appropriate due to intensity of illness or inability to take PO and Inpatient level of care appropriate due to severity of illness   Dispo: The patient is from: Home              Anticipated d/c is to: SNF vs. Home Health              Anticipated d/c date is:1-2 days              Patient currently is not medically stable to d/c.   Consultants:   PCCM Transfer  Cardiothoracic Surgery  Interventional Radiology  Palliative Care Medicine    Procedures:  VATS procedure 05/06/2020 at outside facility 05/20/2020 chest tube removed 05/21/2020 right-sided chest tube placed (CVTS) 05/23/2020 interventional radiology plus anterior chest tube right 05/25/20: CT removal 05/29/20: CT Removal   Antimicrobials:  Anti-infectives (From admission, onward)   Start     Dose/Rate  Route Frequency Ordered Stop   05/17/20 1345  ceFAZolin (ANCEF) IVPB 2g/100 mL premix  Status:  Discontinued        2 g 200 mL/hr over 30 Minutes Intravenous On call 05/17/20 1335 05/18/20 1057   05/17/20 1315  ceFAZolin (ANCEF) powder 1 g  Status:  Discontinued        1 g Other To Surgery 05/17/20 1306 05/17/20 1336   05/10/20 1300  fluconazole (DIFLUCAN) tablet 150 mg        150 mg Oral every 72 hours 05/10/20 1151 05/13/20 1408   05/10/20 0900   Ampicillin-Sulbactam (UNASYN) 3 g in sodium chloride 0.9 % 100 mL IVPB        3 g 200 mL/hr over 30 Minutes Intravenous Every 6 hours 05/10/20 0753 05/17/20 0859   05/08/20 0030  linezolid (ZYVOX) tablet 600 mg  Status:  Discontinued        600 mg Oral Every 12 hours 05/08/20 0015 05/14/20 1113        Subjective: Seen and examined at bedside and thinks her respiratory status is stable.  Wanting her chest tube removed and anxious about getting discharged.  No lightheadedness or dizziness.  No chest pain.  No other concerns or complaints at this time.  Objective: Vitals:   05/29/20 0430 05/29/20 0600 05/29/20 0650 05/29/20 0716  BP: 92/64     Pulse: 88     Resp: (!) 21   20  Temp: 98.6 F (37 C)   98.7 F (37.1 C)  TempSrc: Oral     SpO2: 98%     Weight:  103.3 kg 114.2 kg   Height:        Intake/Output Summary (Last 24 hours) at 05/29/2020 4098 Last data filed at 05/28/2020 2200 Gross per 24 hour  Intake 1320 ml  Output --  Net 1320 ml   Filed Weights   05/28/20 0737 05/29/20 0600 05/29/20 0650  Weight: 100.9 kg 103.3 kg 114.2 kg   Examination: Physical Exam:  Constitutional: WN/WD obese Caucasian female currently in NAD and appears calm and comfortable Eyes: PERRL, lids and conjunctivae normal, sclerae anicteric  ENMT: External Ears, Nose appear normal. Grossly normal hearing.  Neck: Appears normal, supple, no cervical masses, normal ROM, no appreciable thyromegaly; no JVD Respiratory: Diminished to auscultation bilaterally with coarse breath sounds and she has 2 chest tubes in place.  Has some mild rhonchi, but no appreciable wheezing, rales or crackles. Normal respiratory effort and patient is not tachypenic. No accessory muscle use.  Wearing supplemental oxygen via nasal cannula Cardiovascular: RRR, no murmurs / rubs / gallops. S1 and S2 auscultated.  Trace lower extremity edema Abdomen: Soft, non-tender, distended secondary to body habitus. No masses palpated. No  appreciable hepatosplenomegaly. Bowel sounds positive.  GU: Deferred. Musculoskeletal: No clubbing / cyanosis of digits/nails. No joint deformity upper and lower extremities.  Skin: No rashes, lesions, ulcers on a limited skin evaluation. No induration; Warm and dry.  Neurologic: CN 2-12 grossly intact with no focal deficits. Romberg sign and cerebellar reflexes not assessed.  Psychiatric: Normal judgment and insight. Alert and oriented x 3. Normal mood and appropriate affect.   Data Reviewed: I have personally reviewed following labs and imaging studies  CBC: Recent Labs  Lab 05/23/20 0310 05/24/20 0500 05/25/20 0158 05/25/20 2320 05/26/20 0219 05/27/20 0557 05/28/20 0925 05/29/20 0145  WBC 5.1 6.7 7.2 7.5 7.0 5.2 7.1 6.3  NEUTROABS 2.9 4.6 4.9  --  4.5  --   --   --  HGB 9.7* 9.0* 8.7* 8.4* 8.2* 8.6* 9.4* 8.6*  HCT 31.2* 29.6* 28.1* 27.4* 26.6* 27.5* 30.8* 27.1*  MCV 93.4 94.9 94.6 94.2 94.3 92.3 93.3 92.8  PLT 158 206 167 193 177 206 215 187   Basic Metabolic Panel: Recent Labs  Lab 05/23/20 0310 05/23/20 1259 05/24/20 0500 05/25/20 0158 05/26/20 0219 05/27/20 0557 05/28/20 0925 05/29/20 0145  NA  --    < > 134* 135 136 137 136 136  K  --    < > 3.9 4.1 3.8 3.8 4.4 4.3  CL  --    < > 90* 92* 94* 98 97* 100  CO2  --    < > 33* 35* 33* 28 30 28   GLUCOSE  --    < > 170* 133* 225* 118* 160* 226*  BUN  --    < > 31* 31* 25* 20 13 20   CREATININE  --    < > 0.86 0.83 0.87 0.63 0.60 0.93  CALCIUM  --    < > 8.6* 8.6* 8.7* 8.7* 9.0 8.5*  MG 1.9  --  2.0  --   --   --   --   --   PHOS 5.6*  --  3.8  --   --   --   --   --    < > = values in this interval not displayed.   GFR: Estimated Creatinine Clearance: 85.3 mL/min (by C-G formula based on SCr of 0.93 mg/dL). Liver Function Tests: No results for input(s): AST, ALT, ALKPHOS, BILITOT, PROT, ALBUMIN in the last 168 hours. No results for input(s): LIPASE, AMYLASE in the last 168 hours. No results for input(s): AMMONIA  in the last 168 hours. Coagulation Profile: No results for input(s): INR, PROTIME in the last 168 hours. Cardiac Enzymes: No results for input(s): CKTOTAL, CKMB, CKMBINDEX, TROPONINI in the last 168 hours. BNP (last 3 results) No results for input(s): PROBNP in the last 8760 hours. HbA1C: No results for input(s): HGBA1C in the last 72 hours. CBG: Recent Labs  Lab 05/28/20 1606 05/28/20 2011 05/28/20 2358 05/29/20 0430 05/29/20 0725  GLUCAP 278* 239* 226* 208* 163*   Lipid Profile: No results for input(s): CHOL, HDL, LDLCALC, TRIG, CHOLHDL, LDLDIRECT in the last 72 hours. Thyroid Function Tests: No results for input(s): TSH, T4TOTAL, FREET4, T3FREE, THYROIDAB in the last 72 hours. Anemia Panel: No results for input(s): VITAMINB12, FOLATE, FERRITIN, TIBC, IRON, RETICCTPCT in the last 72 hours. Sepsis Labs: Recent Labs  Lab 05/25/20 2320  PROCALCITON 0.26  LATICACIDVEN 1.0    Recent Results (from the past 240 hour(s))  SARS Coronavirus 2 by RT PCR (hospital order, performed in [facility] hospital lab) Nasopharyngeal Nasopharyngeal Swab     Status: None   Collection Time: 05/20/20  3:51 PM   Specimen: Nasopharyngeal Swab  Result Value Ref Range Status   SARS Coronavirus 2 NEGATIVE NEGATIVE Final    Comment: (NOTE) SARS-CoV-2 target nucleic acids are NOT DETECTED.  The SARS-CoV-2 RNA is generally detectable in upper and lower respiratory specimens during the acute phase of infection. The lowest concentration of SARS-CoV-2 viral copies this assay can detect is 250 copies / mL. A negative result does not preclude SARS-CoV-2 infection and should not be used as the sole basis for treatment or other patient management decisions.  A negative result may occur with improper specimen collection / handling, submission of specimen other than nasopharyngeal swab, presence of viral mutation(s) within the areas targeted by this assay,  and inadequate number of viral copies (<250  copies / mL). A negative result must be combined with clinical observations, patient history, and epidemiological information.  Fact Sheet for Patients:   BoilerBrush.com.cy  Fact Sheet for Healthcare Providers: https://pope.com/  This test is not yet approved or  cleared by the [place] FDA and has been authorized for detection and/or diagnosis of SARS-CoV-2 by FDA under an Emergency Use Authorization (EUA).  This EUA will remain in effect (meaning this test can be used) for the duration of the COVID-19 declaration under Section 564(b)(1) of the Act, 21 U.S.C. section 360bbb-3(b)(1), unless the authorization is terminated or revoked sooner.  Performed at [facility], [address], [place], [place] [address]   Culture, blood (routine x 2)     Status: None (Preliminary result)   Collection Time: 05/25/20 11:30 PM   Specimen: BLOOD  Result Value Ref Range Status   Specimen Description BLOOD RIGHT ANTECUBITAL  Final   Special Requests   Final    BOTTLES DRAWN AEROBIC AND ANAEROBIC Blood Culture adequate volume   Culture   Final    NO GROWTH 3 DAYS Performed at [facility], [address] [place], [place] [address]    Report Status PENDING  Incomplete  Culture, blood (routine x 2)     Status: None (Preliminary result)   Collection Time: 05/25/20 11:40 PM   Specimen: BLOOD RIGHT HAND  Result Value Ref Range Status   Specimen Description BLOOD RIGHT HAND  Final   Special Requests   Final    BOTTLES DRAWN AEROBIC AND ANAEROBIC Blood Culture adequate volume   Culture   Final    NO GROWTH 3 DAYS Performed at [facility], [address] [place], [place] [address]    Report Status PENDING  Incomplete     RN Pressure Injury Documentation: Pressure Injury 05/07/20 Coccyx Medial;Mid Stage 2 -  Partial thickness loss of dermis presenting as a shallow open injury with a red, pink wound bed without slough.  (Active)  05/07/20 2100  Location: Coccyx  Location Orientation: Medial;Mid  Staging: Stage 2 -  Partial thickness loss of dermis presenting as a shallow open injury with a red, pink wound bed without slough.  Wound Description (Comments):   Present on Admission: Yes   Estimated body mass index is 38.27 kg/m as calculated from the following:   Height as of this encounter:  (1.727 m).   Weight as of this encounter: 114.2 kg.  Malnutrition Type:  Nutrition Problem: Increased nutrient needs Etiology: wound healing,acute illness (recent COVID PNA)  Malnutrition Characteristics:  Signs/Symptoms: estimated needs  Nutrition Interventions:  Interventions: MVI,Ensure Enlive (each supplement provides 350kcal and 20 grams of protein)  Radiology Studies: DG CHEST PORT 1 VIEW  Result Date: 05/29/2020 CLINICAL DATA:  Follow-up pneumothorax EXAM: PORTABLE CHEST 1 VIEW COMPARISON:  05/27/2020 FINDINGS: Cardiac shadow is stable. Diffuse subcutaneous emphysema is again seen but slightly decreased. Right-sided pigtail catheter is again noted. No pneumothorax is seen. Persistent opacity is noted in the right apex stable from the prior study. No new focal infiltrate is seen. No bony abnormality is noted. IMPRESSION: Stable appearance of the chest when compared with the prior exam. Electronically Signed   By: Alcide Clever M.D.   On: 05/29/2020 08:04   Scheduled Meds:  atorvastatin  40 mg Oral Daily   bisacodyl  10 mg Oral Daily   Chlorhexidine Gluconate Cloth  6 each Topical Daily  cholecalciferol  1,000 Units Oral Daily   clonazePAM  0.5 mg Oral BID   enoxaparin (LOVENOX) injection  40 mg Subcutaneous Q24H   fenofibrate  160 mg Oral Daily   Gerhardt's butt cream   Topical BID   guaiFENesin  600 mg Oral BID   insulin aspart  0-15 Units Subcutaneous Q4H   insulin glargine  25 Units Subcutaneous QHS   lidocaine  1 patch Transdermal Q24H   multivitamin with minerals  1 tablet  Oral Daily   nystatin   Topical BID   pregabalin  150 mg Oral TID   senna-docusate  1 tablet Oral QHS   sodium chloride flush  10-40 mL Intracatheter Q12H   umeclidinium bromide  1 puff Inhalation Daily   Continuous Infusions:   LOS: 22 days   Merlene Laughtermair Latif Somaya Grassi, DO [facility] PAGER is on AMION  If 7PM-7AM, please contact night-coverage [email or web address]

## 2020-05-30 ENCOUNTER — Inpatient Hospital Stay (HOSPITAL_COMMUNITY): Payer: Medicare Other

## 2020-05-30 LAB — COMPREHENSIVE METABOLIC PANEL
ALT: 31 U/L (ref 0–44)
AST: 25 U/L (ref 15–41)
Albumin: 1.7 g/dL — ABNORMAL LOW (ref 3.5–5.0)
Alkaline Phosphatase: 62 U/L (ref 38–126)
Anion gap: 6 (ref 5–15)
BUN: 19 mg/dL (ref 6–20)
CO2: 29 mmol/L (ref 22–32)
Calcium: 8.6 mg/dL — ABNORMAL LOW (ref 8.9–10.3)
Chloride: 100 mmol/L (ref 98–111)
Creatinine, Ser: 0.74 mg/dL (ref 0.44–1.00)
GFR, Estimated: >60 mL/min (ref >60)
Glucose, Bld: 215 mg/dL — ABNORMAL HIGH (ref 70–99)
Potassium: 4.8 mmol/L (ref 3.5–5.1)
Sodium: 135 mmol/L (ref 135–145)
Total Bilirubin: 0.4 mg/dL (ref 0.3–1.2)
Total Protein: 5.2 g/dL — ABNORMAL LOW (ref 6.5–8.1)

## 2020-05-30 LAB — CBC WITH DIFFERENTIAL/PLATELET
Abs Immature Granulocytes: 0.07 10*3/uL (ref 0.00–0.07)
Basophils Absolute: 0 10*3/uL (ref 0.0–0.1)
Basophils Relative: 1 %
Eosinophils Absolute: 0.1 10*3/uL (ref 0.0–0.5)
Eosinophils Relative: 2 %
HCT: 26.9 % — ABNORMAL LOW (ref 36.0–46.0)
Hemoglobin: 8.9 g/dL — ABNORMAL LOW (ref 12.0–15.0)
Immature Granulocytes: 1 %
Lymphocytes Relative: 16 %
Lymphs Abs: 1.3 10*3/uL (ref 0.7–4.0)
MCH: 30.4 pg (ref 26.0–34.0)
MCHC: 33.1 g/dL (ref 30.0–36.0)
MCV: 91.8 fL (ref 80.0–100.0)
Monocytes Absolute: 0.7 10*3/uL (ref 0.1–1.0)
Monocytes Relative: 9 %
Neutro Abs: 5.7 10*3/uL (ref 1.7–7.7)
Neutrophils Relative %: 71 %
Platelets: 181 10*3/uL (ref 150–400)
RBC: 2.93 MIL/uL — ABNORMAL LOW (ref 3.87–5.11)
RDW: 17.4 % — ABNORMAL HIGH (ref 11.5–15.5)
WBC: 8 10*3/uL (ref 4.0–10.5)
nRBC: 0 % (ref 0.0–0.2)

## 2020-05-30 LAB — GLUCOSE, CAPILLARY
Glucose-Capillary: 165 mg/dL — ABNORMAL HIGH (ref 70–99)
Glucose-Capillary: 190 mg/dL — ABNORMAL HIGH (ref 70–99)
Glucose-Capillary: 254 mg/dL — ABNORMAL HIGH (ref 70–99)
Glucose-Capillary: 231 mg/dL — ABNORMAL HIGH (ref 70–99)
Glucose-Capillary: 246 mg/dL — ABNORMAL HIGH (ref 70–99)
Glucose-Capillary: 279 mg/dL — ABNORMAL HIGH (ref 70–99)

## 2020-05-30 LAB — PHOSPHORUS: Phosphorus: 3.3 mg/dL (ref 2.5–4.6)

## 2020-05-30 LAB — MAGNESIUM: Magnesium: 1.7 mg/dL (ref 1.7–2.4)

## 2020-05-30 NOTE — TOC Progression Note (Addendum)
Transition of Care [facility]) - Progression Note    Patient Details  Name: Annette [name] MRN: [id number] Date of Birth: 03-May-1961  Transition of [facility] CM/SW Contact  Lorri Frederick, LCSW Phone Number: 05/30/2020, 2:05 PM  Clinical Narrative:   CSW has contacted the following facilities in [place] regarding HH services: [facility]: "can't take medicare" [facility]  "not taking outside referrals." [facility]: declined due to insurance [facility], [phone]: referral faxed to [phone], awaiting response Frances Furbish has [place] office. Kandee Keen attempting to make referral to them.1520: [facility] declined.      Expected Discharge Plan: Home w Home Health Services Barriers to Discharge: No Home Care Agency will accept this patient  Expected Discharge Plan and Services Expected Discharge Plan: Home w Home Health Services In-house Referral: Clinical Social Work       Expected Discharge Date: 05/21/20                                     Social Determinants of Health (SDOH) Interventions    Readmission Risk Interventions No flowsheet data found.

## 2020-05-30 NOTE — Progress Notes (Signed)
Inpatient Diabetes Program Recommendations  AACE/ADA: New Consensus Statement on Inpatient Glycemic Control (2015)  Target Ranges:  Prepandial:   less than 140 mg/dL      Peak postprandial:   less than 180 mg/dL (1-2 hours)      Critically ill patients:  140 - 180 mg/dL   Lab Results  Component Value Date   GLUCAP 254 (H) 05/30/2020   HGBA1C 10.7 (H) 05/08/2020    Review of Glycemic Control Results for Annette [name], Annette [name] (MRN [id number]) as of 05/30/2020 12:07  Ref. Range 05/29/2020 19:22 05/29/2020 23:35  Glucose-Capillary Latest Ref Range: 70 - 99 mg/dL 409 (H) 811 (H)  History: DM  Home DM Meds: Glipizide 10 mg BID                             Humalog SSi (per pt report)  Current Orders: Novolog moderate Correction Scale/ SSI (0-9 units) TID AC + HS      Lantus 25 units q HS      Novolog 3 units tid with meals (hold if patient eats less than 50% or NPO)  Inpatient Diabetes Program Recommendations:    Note A1C elevated at admission. Will likely need Lantus as well as Glipizide and Humalog SSI at d/c.   Diabetes coordinator spoke to patient earlier during hospitalization and she is agreeable to further escalation of insulin if needed.  She knows how to administer insulin  Thanks,  Beryl Meager, RN, BC-ADM Inpatient Diabetes Coordinator Pager [phone] (8a-5p)

## 2020-05-30 NOTE — Progress Notes (Addendum)
[address]       [place] [address]             [phone]       13 Days Post-Op Procedure(s) (LRB): RIGHT CHEST WALL WOUND CLOSURE REMOVAL OF WOUND VAC (Right) Subjective: Awake and alert, says she feels good today.  No new concerns.   Objective: Vital signs in last 24 hours: Temp:  [98.2 F (36.8 C)-100.1 F (37.8 C)] 98.2 F (36.8 C) (12/16 0723) Pulse Rate:  [95-100] 95 (12/16 0300) Cardiac Rhythm: Normal sinus rhythm (12/16 0703) Resp:  [21-26] 21 (12/16 0300) BP: (114-136)/(62-75) 115/65 (12/16 0300) SpO2:  [91 %-98 %] 97 % (12/16 0300) Weight:  [101.4 kg] 101.4 kg (12/16 0600)     Intake/Output from previous day: 12/15 0701 - 12/16 0700 In: 840 [P.O.:820; I.V.:20] Out: 0  Intake/Output this shift: Total I/O In: -  Out: 700 [Urine:700]  General appearance: alert, cooperative and no distress Heart: regular rate and rhythm Lungs: Minimal palpable subQ air.  No air leak from the remaining pigtail chest tube.  Abdomen: benign Wound: the right chest wound closure site is now draining thin maroon fluid c/w old bloody pleural fluid. The skin edges remain approximated with sutures and staples.   Lab Results: Recent Labs    05/29/20 0145 05/30/20 0038  WBC 6.3 8.0  HGB 8.6* 8.9*  HCT 27.1* 26.9*  PLT 187 181   BMET:  Recent Labs    05/29/20 0145 05/30/20 0038  NA 136 135  K 4.3 4.8  CL 100 100  CO2 28 29  GLUCOSE 226* 215*  BUN 20 19  CREATININE 0.93 0.74  CALCIUM 8.5* 8.6*    PT/INR: No results for input(s): LABPROT, INR in the last 72 hours. ABG    Component Value Date/Time   PHART 7.400 05/12/2020 1524   HCO3 36.1 (H) 05/12/2020 1524   TCO2 38 (H) 05/12/2020 1524   O2SAT 99.0 05/12/2020 1524   CBG (last 3)  Recent Labs    05/29/20 2335 05/30/20 0418 05/30/20 0725  GLUCAP 217* 165* 190*    Meds Scheduled Meds: . atorvastatin  40 mg Oral Daily  . bisacodyl  10 mg Oral Daily  . Chlorhexidine Gluconate  Cloth  6 each Topical Daily  . cholecalciferol  1,000 Units Oral Daily  . clonazePAM  0.5 mg Oral BID  . enoxaparin (LOVENOX) injection  40 mg Subcutaneous Q24H  . fenofibrate  160 mg Oral Daily  . Gerhardt's butt cream   Topical BID  . guaiFENesin  600 mg Oral BID  . insulin aspart  0-15 Units Subcutaneous TID WC  . insulin aspart  3 Units Subcutaneous TID WC  . insulin glargine  25 Units Subcutaneous QHS  . lidocaine  1 patch Transdermal Q24H  . multivitamin with minerals  1 tablet Oral Daily  . nystatin   Topical BID  . pregabalin  150 mg Oral TID  . senna-docusate  1 tablet Oral QHS  . sodium chloride flush  10-40 mL Intracatheter Q12H  . umeclidinium bromide  1 puff Inhalation Daily   Continuous Infusions: PRN Meds:.acetaminophen, HYDROmorphone (DILAUDID) injection, influenza vac split quadrivalent PF, levalbuterol, magnesium citrate, ondansetron (ZOFRAN) IV, oxyCODONE, pneumococcal 23 valent vaccine, polyethylene glycol, sodium chloride flush, traMADol  Xrays DG CHEST PORT 1 VIEW  Result Date: 05/30/2020 CLINICAL DATA:  Chest tube present.  Recent pneumothorax. EXAM: PORTABLE CHEST 1 VIEW COMPARISON:  May 29, 2020 FINDINGS: Chest tube again  noted on the right. No appreciable pneumothorax. Extensive subcutaneous emphysema remains. There is extensive airspace opacity throughout much of the right lung, stable. Mild atelectasis left mid and lower lung regions. Heart size and pulmonary vascularity normal. No adenopathy. No appreciable bone lesions. IMPRESSION: Chest tube again noted on the right. No pneumothorax. Extensive subcutaneous emphysema remains. Extensive consolidation throughout much of the right lung, particularly more superiorly on the right, persists. Patchy atelectasis on the left. Stable cardiac silhouette. Electronically Signed   By: Bretta Bang III M.D.   On: 05/30/2020 08:10   DG CHEST PORT 1 VIEW  Result Date: 05/29/2020 CLINICAL DATA:  Follow-up  pneumothorax EXAM: PORTABLE CHEST 1 VIEW COMPARISON:  05/27/2020 FINDINGS: Cardiac shadow is stable. Diffuse subcutaneous emphysema is again seen but slightly decreased. Right-sided pigtail catheter is again noted. No pneumothorax is seen. Persistent opacity is noted in the right apex stable from the prior study. No new focal infiltrate is seen. No bony abnormality is noted. IMPRESSION: Stable appearance of the chest when compared with the prior exam. Electronically Signed   By: Alcide Clever M.D.   On: 05/29/2020 08:04    Assessment/Plan: S/P Procedure(s) (LRB): RIGHT CHEST WALL WOUND CLOSURE REMOVAL OF WOUND VAC (Right)   -No PTX, no air leak from remaining pleural catheter. No increase in the subQ air on exam but the right lung is not as well aerated today on CXR. Need to continue to work on pulmonary hygiene. Leave the catheter to water seal for now and switch to a mini collection system closer to the time of discharge.   -Wound drainage- She is not febrile and WBC is not elevated. This looks like old pleural fluid. Dressing changes ordered.    I LOS: 23 days    Leary Roca PA-C Pager [phone] 05/30/2020 Pt seen and examined; agree with documentation. CXR looking very stable; improved on exam. Transition to pneumostat and prepare for discharge. Nickolus Wadding Z. Vickey Sages, MD [phone]

## 2020-05-30 NOTE — Progress Notes (Signed)
PROGRESS NOTE    Gisell Buehrle  WUJ:[id number] DOB: [birth year] DOA: 05/07/2020 PCP: Patient, No Pcp Per    Brief Narrative:  The patient is a 59/F w/ COPD, type 2 diabetes mellitus, Obesity, hypertension and dyslipidemia was admitted to [facility] on 11/19 with MRSA right upper lobe pneumonia, right lower lobe subsegmental pulmonary embolism she was treated with IV antibiotics and Lovenox for anticoagulation, subsequently developed right-sided hydropneumothorax on 11/22 underwent chest tube insertion and was transferred to [facility] for TCTS evaluation. -Upon arrival here, CT chest noted moderately large right pneumothorax, extensive subcutaneous emphysema throughout the right chest wall extending to the neck, had a chest tube placed on 11/24 -Then complicated by hemothorax, acute blood loss anemia, on 11/28 underwent evacuation of hematoma, mechanical pleurodesis and wound VAC placement -Subsequently on 12/3 underwent right chest wall wound closure and removal of wound VAC -Chest tubes removed on 12/ 6 after several days of minimal drainage without air leak -On 12/7 developed acute worsening severe subcutaneous emphysema extending to the neck and face, had another chest tube placed on the right by T CTS -Had persistent right pneumothorax and finally underwent another right chest tube placement in IR on 12/9 -Also seen and followed by palliative care this admission -Transferred from PCCM to [facility] service 12/10 -Right superior chest tube was removed, the proximal port was outside the chest wall -Now slowly improving, has 2 chest tubes to suction and the plan by CT surgery on 12/15 is to have Large-Bore Chest Tube to be removed and place the pigtail catheter to water seal; cardiothoracic surgery feels that there is no recurrence of her pneumothorax or increase in her subcutaneous air she will be able to discharge with the pigtail catheter in place connected to the Pneumostat  collection chamber  05/30/2020 **Patient's large bore chest tube was removed yesterday.  Anticipating discharge in the next 24 to 48 hours and having TOC assist with obtaining home health.  DME hospital bed was ordered.  Patient is still refusing SNF placement.  Cardiothoracic surgery reviewed the patient's chest x-ray and it showed no pneumothorax and no air leak from the remaining pleural catheter.  She is not had any increase in subcutaneous air on exam and they are recommending her work on her pulmonary hygiene.  They are leaving the other catheter to waterseal for now and switching to many collection system closer to the time of discharge and she will need Cardiothoracic Clearance for this. Patient will need to have all on home health equipment arranged prior to her safely discharging.  Assessment & Plan:   Principal Problem:   MRSA pneumonia (HCC) Active Problems:   Pulmonary embolism (HCC)   Pneumothorax   Acute hypoxemic respiratory failure (HCC)   COPD (chronic obstructive pulmonary disease) (HCC)   Pressure injury of skin   Palliative care by specialist   Goals of care, counseling/discussion   DNR (do not resuscitate)  Complicated Pneumothorax Extensive subcutaneous emphysema MRSA pneumonia -Completed antibiotic therapy -Initial chest tube placed at outside hospital on 11/24 followed by repeat chest tube placement on 11/28, both chest tubes were removed on 12/6 with acute worsening subcutaneous emphysema and pneumothorax; She underwent VATS/Thoracotomy Mechanical Pleurodesis with Wound VAC Placement on 11/28 -Repeat chest tube placement on 12/7, and another in IR on 12/9 -Superior most chest tube which was dislodged and contributing to subcutaneous emphysema was removed 12/10 -Now clinically appears stable, both chest tubes remained to suction, repeat chest x-ray without pneumothorax  -Continue management  per TCTS; the plan by CT surgery on 12/15 is to have Large-Bore Chest Tube  to be removed and place the pigtail catheter to water seal; cardiothoracic surgery feels that there is no recurrence of her pneumothorax or increase in her subcutaneous air she will be able to discharge with the pigtail catheter in place connected to the Pneumostat collection chamber -Large bore chest tube was removed yesterday and she only has 1 remaining  Repeat chest x-ray shows no pneumothorax no air leak from the remaining pleural catheter; there is no increase in subcutaneous air on exam the right lung is not as well aerated and cardiothoracic surgery recommending work on pulmonary hygiene so will obtain incentive stronger and flutter valve -C/w Pain Control with IV Hydromorphone 0.5 mg IV q4hprn Severe Pain and with Oxycodone 5-10 mg po q4hprn Moderate Pain and also has tramadol 50 to 100 mg p.o. every 6 as needed for mild pain -C/w Breathing treatments with Levalbeterol 0.63 mg Neb q6hprn Wheezing and C/w home Umeclidinium Bromide 1 puff IH Daily for now -Started low-dose Lovenox for DVT prophylaxis  -C/w PT/OT and they are recommending SNF but if refusing SNF will need Home Health Maximized -Repeat CXR 05/29/20 AM showed "Cardiac shadow is stable. Diffuse subcutaneous emphysema is again seen but slightly decreased. Right-sided pigtail catheter is again noted. No pneumothorax is seen. Persistent opacity is noted in the right apex stable from the prior study. No new focal infiltrate is seen. No bony abnormality is noted." -Repeat CXR this AM showed "Chest tube again noted on the right. No pneumothorax. Extensive subcutaneous emphysema remains. Extensive consolidation throughout much of the right lung, particularly more superiorly on the right, persists. Patchy atelectasis on the left. Stable cardiac silhouette."  -Further plans and Management per cardiothoracic surgery and they are following; I spoke to Gershon Crane of TCTS and he states he will speak with Dr. Vickey Sages about further care and plan;  Currently not a safe Discharge until TCTS clears the patient   Recent Subsegmental Pulmonary Embolism -CT at outside hospital prior on admission noted right subsegmental PE in addition pneumonia, was initially on full dose Lovenox, then developed acute blood loss anemia and massive chest wall hematoma which required surgical evacuation -SpO2: 97 % O2 Flow Rate (L/min): 4 L/min FiO2 (%): 55 % -Echocardiogram did not note any right heart strain -Anticoagulation subsequently had been on hold, no overt bleeding at this time -Restart anticoagulation when appropriate per TCTS -Started Lovenox for DVT prophylaxis on 12/12 for now and will defer to TCTS to resume full dose Anticoagulation; Will need them to weigh in on this   Acute Bood Loss Anemia -In the setting of hemothorax and intermittent bleeding on anticoagulation with Eliquis  -Received 4 units of PRBC this admission -Hemoglobin stable now at 8.6/27.1 yesterday and today it is 8.9/26.9 -Continue to Monitor for S/Sx of Bleeding -Repeat CBC in the AM   COPD Hx of COVID PNA -Continue nebs and inhalers as above -Wean O2 as tolerated  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 twice daily  Constipation -Continue with bowel regimen continue with bisacodyl 10 mg p.o. daily, polyethylene glycol 17 g p.o. twice daily as needed for mild constipation, and senna docusate 1 tab p.o. nightly -She states that she had a bowel movement yesterday  HLD -C/w Atorvastatin 40 mg po Daily and  Fenofibrate 160 mg po Daily   Obesity -Complicates overall prognosis and Care -Estimated body mass index is 33.99 kg/m as calculated from the following:   Height as of this encounter: 5\' 8"  (1.727 m).   Weight as of this encounter: 101.4 kg. -Weight Loss and Dietary Counseling given  -Nutritionist consulted and recommending Snacks BID, Double Protein with Meals and MVI with Minerals Daily  GOC: DNR, poA  DVT prophylaxis: Enoxaparin 40 mg sq q24h Code Status: Partial Code Family Communication: No family present at bedside  Disposition Plan: Need further clinical improvement and clearance by Cardiothoracic Surgery  Status is: Inpatient  Remains inpatient appropriate because:Unsafe d/c plan, IV treatments appropriate due to intensity of illness or inability to take PO and Inpatient level of care appropriate due to severity of illness   Dispo: The patient is from: Home              Anticipated d/c is to: SNF vs. Home Health              Anticipated d/c date is:1-2 days but will defer to TCTS when it is safe to Discharge               Patient currently is not medically stable to d/c.  Consultants:   PCCM Transfer  Cardiothoracic Surgery  Interventional Radiology  Palliative Care Medicine    Procedures:  VATS procedure 05/06/2020 at outside facility 05/20/2020 chest tube removed 05/21/2020 right-sided chest tube placed (CVTS) 05/23/2020 interventional radiology plus anterior chest tube right 05/25/20: CT removal 05/29/20: CT Removal   Antimicrobials:  Anti-infectives (From admission, onward)   Start     Dose/Rate Route Frequency Ordered Stop   05/17/20 1345  ceFAZolin (ANCEF) IVPB 2g/100 mL premix  Status:  Discontinued        2 g 200 mL/hr over 30 Minutes Intravenous On call 05/17/20 1335 05/18/20 1057   05/17/20 1315  ceFAZolin (ANCEF) powder 1 g  Status:  Discontinued        1 g Other To Surgery 05/17/20 1306 05/17/20 1336   05/10/20 1300  fluconazole (DIFLUCAN)  tablet 150 mg        150 mg Oral every 72 hours 05/10/20 1151 05/13/20 1408   05/10/20 0900  Ampicillin-Sulbactam (UNASYN) 3 g in sodium chloride 0.9 % 100 mL IVPB        3 g 200 mL/hr over 30 Minutes Intravenous Every 6 hours 05/10/20 0753 05/17/20 0859   05/08/20 0030  linezolid (ZYVOX) tablet 600 mg  Status:  Discontinued        600 mg Oral Every 12 hours 05/08/20 0015 05/14/20 1113        Subjective: Seen and examined at bedside and she was talking on the phone the entire time I was in the room and she states that she had no complaints.  Stated that she had a bowel movement.  Thinks that she feels good and still wants to go home.  No other concerns or complaints at this time.  Objective: Vitals:   05/30/20 0300 05/30/20 0600 05/30/20 0723 05/30/20 1117  BP: 115/65     Pulse: 95     Resp: (!) 21  Temp: 98.7 F (37.1 C)  98.2 F (36.8 C) 98.3 F (36.8 C)  TempSrc: Oral     SpO2: 97%     Weight:  101.4 kg    Height:        Intake/Output Summary (Last 24 hours) at 05/30/2020 1433 Last data filed at 05/30/2020 [id number] Gross per 24 hour  Intake 460 ml  Output 700 ml  Net -240 ml   Filed Weights   05/29/20 0600 05/29/20 0650 05/30/20 0600  Weight: 103.3 kg 114.2 kg 101.4 kg   Examination: Physical Exam:  Constitutional: WN/WD obese Caucasian female currently in no acute distress and is calm and comfortable talking on the phone Eyes: Lids and conjunctivae normal, sclerae anicteric  ENMT: External Ears, Nose appear normal. Grossly normal hearing.   Neck: Appears normal, supple, no cervical masses, normal ROM, no appreciable thyromegaly; no JVD Respiratory: Diminished to auscultation bilaterally worse on the Right compared to the Left, no wheezing, rales, rhonchi or crackles. Normal respiratory effort and patient is not tachypenic. No accessory muscle use. Unlabored breathing but has a Chest Tube Cardiovascular: RRR, no murmurs / rubs / gallops. S1 and S2 auscultated. 1+  extremity edema. Abdomen: Soft, non-tender, Distended 2/2 body habitus. No masses palpated. Bowel sounds positive.  GU: Deferred. Musculoskeletal: No clubbing / cyanosis of digits/nails. No joint deformity upper and lower extremities.  Skin: No rashes, lesions, ulcers on a limited skin evaluation. No induration; Warm and dry.  Neurologic: CN 2-12 grossly intact with no focal deficits.  Romberg sign and cerebellar reflexes not assessed.  Psychiatric: Normal judgment and insight. Alert and oriented x 3. Normal mood and appropriate affect.   Data Reviewed: I have personally reviewed following labs and imaging studies  CBC: Recent Labs  Lab 05/24/20 0500 05/25/20 0158 05/25/20 2320 05/26/20 0219 05/27/20 0557 05/28/20 0925 05/29/20 0145 05/30/20 0038  WBC 6.7 7.2   < > 7.0 5.2 7.1 6.3 8.0  NEUTROABS 4.6 4.9  --  4.5  --   --   --  5.7  HGB 9.0* 8.7*   < > 8.2* 8.6* 9.4* 8.6* 8.9*  HCT 29.6* 28.1*   < > 26.6* 27.5* 30.8* 27.1* 26.9*  MCV 94.9 94.6   < > 94.3 92.3 93.3 92.8 91.8  PLT 206 167   < > 177 206 215 187 181   < > = values in this interval not displayed.   Basic Metabolic Panel: Recent Labs  Lab 05/24/20 0500 05/25/20 0158 05/26/20 0219 05/27/20 0557 05/28/20 0925 05/29/20 0145 05/30/20 0038  NA 134*   < > 136 137 136 136 135  K 3.9   < > 3.8 3.8 4.4 4.3 4.8  CL 90*   < > 94* 98 97* 100 100  CO2 33*   < > 33* 28 30 28 29   GLUCOSE 170*   < > 225* 118* 160* 226* 215*  BUN 31*   < > 25* 20 13 20 19   CREATININE 0.86   < > 0.87 0.63 0.60 0.93 0.74  CALCIUM 8.6*   < > 8.7* 8.7* 9.0 8.5* 8.6*  MG 2.0  --   --   --   --   --  1.7  PHOS 3.8  --   --   --   --   --  3.3   < > = values in this interval not displayed.   GFR: Estimated Creatinine Clearance: 93.1 mL/min (by C-G formula based on SCr of 0.74 mg/dL). Liver Function  Tests: Recent Labs  Lab 05/30/20 0038  AST 25  ALT 31  ALKPHOS 62  BILITOT 0.4  PROT 5.2*  ALBUMIN 1.7*   No results for input(s):  LIPASE, AMYLASE in the last 168 hours. No results for input(s): AMMONIA in the last 168 hours. Coagulation Profile: No results for input(s): INR, PROTIME in the last 168 hours. Cardiac Enzymes: No results for input(s): CKTOTAL, CKMB, CKMBINDEX, TROPONINI in the last 168 hours. BNP (last 3 results) No results for input(s): PROBNP in the last 8760 hours. HbA1C: No results for input(s): HGBA1C in the last 72 hours. CBG: Recent Labs  Lab 05/29/20 1922 05/29/20 2335 05/30/20 0418 05/30/20 0725 05/30/20 1120  GLUCAP 214* 217* 165* 190* 254*   Lipid Profile: No results for input(s): CHOL, HDL, LDLCALC, TRIG, CHOLHDL, LDLDIRECT in the last 72 hours. Thyroid Function Tests: No results for input(s): TSH, T4TOTAL, FREET4, T3FREE, THYROIDAB in the last 72 hours. Anemia Panel: No results for input(s): VITAMINB12, FOLATE, FERRITIN, TIBC, IRON, RETICCTPCT in the last 72 hours. Sepsis Labs: Recent Labs  Lab 05/25/20 2320  PROCALCITON 0.26  LATICACIDVEN 1.0    Recent Results (from the past 240 hour(s))  SARS Coronavirus 2 by RT PCR (hospital order, performed in [facility] hospital lab) Nasopharyngeal Nasopharyngeal Swab     Status: None   Collection Time: 05/20/20  3:51 PM   Specimen: Nasopharyngeal Swab  Result Value Ref Range Status   SARS Coronavirus 2 NEGATIVE NEGATIVE Final    Comment: (NOTE) SARS-CoV-2 target nucleic acids are NOT DETECTED.  The SARS-CoV-2 RNA is generally detectable in upper and lower respiratory specimens during the acute phase of infection. The lowest concentration of SARS-CoV-2 viral copies this assay can detect is 250 copies / mL. A negative result does not preclude SARS-CoV-2 infection and should not be used as the sole basis for treatment or other patient management decisions.  A negative result may occur with improper specimen collection / handling, submission of specimen other than nasopharyngeal swab, presence of viral mutation(s) within the areas  targeted by this assay, and inadequate number of viral copies (<250 copies / mL). A negative result must be combined with clinical observations, patient history, and epidemiological information.  Fact Sheet for Patients:   BoilerBrush.com.cy  Fact Sheet for Healthcare Providers: https://pope.com/  This test is not yet approved or  cleared by the [place] FDA and has been authorized for detection and/or diagnosis of SARS-CoV-2 by FDA under an Emergency Use Authorization (EUA).  This EUA will remain in effect (meaning this test can be used) for the duration of the COVID-19 declaration under Section 564(b)(1) of the Act, 21 U.S.C. section 360bbb-3(b)(1), unless the authorization is terminated or revoked sooner.  Performed at [facility], [address] [place], [place] [address]   Culture, blood (routine x 2)     Status: None (Preliminary result)   Collection Time: 05/25/20 11:30 PM   Specimen: BLOOD  Result Value Ref Range Status   Specimen Description BLOOD RIGHT ANTECUBITAL  Final   Special Requests   Final    BOTTLES DRAWN AEROBIC AND ANAEROBIC Blood Culture adequate volume   Culture   Final    NO GROWTH 4 DAYS Performed at [facility], [address], [place], [place] [address]    Report Status PENDING  Incomplete  Culture, blood (routine x 2)     Status: None (Preliminary result)   Collection Time: 05/25/20 11:40 PM   Specimen: BLOOD RIGHT HAND  Result Value Ref Range Status   Specimen Description BLOOD RIGHT HAND  Final   Special Requests   Final    BOTTLES DRAWN AEROBIC AND ANAEROBIC Blood Culture adequate volume   Culture   Final    NO GROWTH 4 DAYS Performed at [facility], [address], [place], [place] [address]    Report Status PENDING  Incomplete     RN Pressure Injury Documentation: Pressure Injury 05/07/20 Coccyx Medial;Mid Stage 2 -  Partial thickness loss of dermis presenting as  a shallow open injury with a red, pink wound bed without slough. (Active)  05/07/20 2100  Location: Coccyx  Location Orientation: Medial;Mid  Staging: Stage 2 -  Partial thickness loss of dermis presenting as a shallow open injury with a red, pink wound bed without slough.  Wound Description (Comments):   Present on Admission: Yes   Estimated body mass index is 33.99 kg/m as calculated from the following:   Height as of this encounter:  (1.727 m).   Weight as of this encounter: 101.4 kg.  Malnutrition Type:  Nutrition Problem: Increased nutrient needs Etiology: wound healing,acute illness (recent COVID PNA)  Malnutrition Characteristics:  Signs/Symptoms: estimated needs  Nutrition Interventions:  Interventions: MVI,Ensure Enlive (each supplement provides 350kcal and 20 grams of protein)  Radiology Studies: DG CHEST PORT 1 VIEW  Result Date: 05/30/2020 CLINICAL DATA:  Chest tube present.  Recent pneumothorax. EXAM: PORTABLE CHEST 1 VIEW COMPARISON:  May 29, 2020 FINDINGS: Chest tube again noted on the right. No appreciable pneumothorax. Extensive subcutaneous emphysema remains. There is extensive airspace opacity throughout much of the right lung, stable. Mild atelectasis left mid and lower lung regions. Heart size and pulmonary vascularity normal. No adenopathy. No appreciable bone lesions. IMPRESSION: Chest tube again noted on the right. No pneumothorax. Extensive subcutaneous emphysema remains. Extensive consolidation throughout much of the right lung, particularly more superiorly on the right, persists. Patchy atelectasis on the left. Stable cardiac silhouette. Electronically Signed   By: Bretta Bang III M.D.   On: 05/30/2020 08:10   DG CHEST PORT 1 VIEW  Result Date: 05/29/2020 CLINICAL DATA:  Follow-up pneumothorax EXAM: PORTABLE CHEST 1 VIEW COMPARISON:  05/27/2020 FINDINGS: Cardiac shadow is stable. Diffuse subcutaneous emphysema is again seen but slightly  decreased. Right-sided pigtail catheter is again noted. No pneumothorax is seen. Persistent opacity is noted in the right apex stable from the prior study. No new focal infiltrate is seen. No bony abnormality is noted. IMPRESSION: Stable appearance of the chest when compared with the prior exam. Electronically Signed   By: Alcide Clever M.D.   On: 05/29/2020 08:04   Scheduled Meds:  atorvastatin  40 mg Oral Daily   bisacodyl  10 mg Oral Daily   Chlorhexidine Gluconate Cloth  6 each Topical Daily   cholecalciferol  1,000 Units Oral Daily   clonazePAM  0.5 mg Oral BID   enoxaparin (LOVENOX) injection  40 mg Subcutaneous Q24H   fenofibrate  160 mg Oral Daily   Gerhardt's butt cream   Topical BID   guaiFENesin  600 mg Oral BID   insulin aspart  0-15 Units Subcutaneous TID WC   insulin aspart  3 Units Subcutaneous TID WC   insulin glargine  25 Units Subcutaneous QHS   lidocaine  1 patch Transdermal Q24H   multivitamin with minerals  1 tablet Oral Daily   nystatin   Topical BID   pregabalin  150 mg Oral TID   senna-docusate  1 tablet  Oral QHS   sodium chloride flush  10-40 mL Intracatheter Q12H   umeclidinium bromide  1 puff Inhalation Daily   Continuous Infusions:   LOS: 23 days   Merlene Laughter, DO [facility] PAGER is on AMION  If 7PM-7AM, please contact night-coverage [email or web address]

## 2020-05-31 ENCOUNTER — Inpatient Hospital Stay (HOSPITAL_COMMUNITY): Payer: Medicare Other

## 2020-05-31 LAB — COMPREHENSIVE METABOLIC PANEL
ALT: 32 U/L (ref 0–44)
AST: 24 U/L (ref 15–41)
Albumin: 1.7 g/dL — ABNORMAL LOW (ref 3.5–5.0)
Alkaline Phosphatase: 68 U/L (ref 38–126)
Anion gap: 9 (ref 5–15)
BUN: 17 mg/dL (ref 6–20)
CO2: 28 mmol/L (ref 22–32)
Calcium: 8.6 mg/dL — ABNORMAL LOW (ref 8.9–10.3)
Chloride: 98 mmol/L (ref 98–111)
Creatinine, Ser: 0.66 mg/dL (ref 0.44–1.00)
GFR, Estimated: >60 mL/min (ref >60)
Glucose, Bld: 329 mg/dL — ABNORMAL HIGH (ref 70–99)
Potassium: 4.3 mmol/L (ref 3.5–5.1)
Sodium: 135 mmol/L (ref 135–145)
Total Bilirubin: 0.5 mg/dL (ref 0.3–1.2)
Total Protein: 5.6 g/dL — ABNORMAL LOW (ref 6.5–8.1)

## 2020-05-31 LAB — CBC WITH DIFFERENTIAL/PLATELET
Abs Immature Granulocytes: 0.05 10*3/uL (ref 0.00–0.07)
Basophils Absolute: 0 10*3/uL (ref 0.0–0.1)
Basophils Relative: 1 %
Eosinophils Absolute: 0.1 10*3/uL (ref 0.0–0.5)
Eosinophils Relative: 2 %
HCT: 27.8 % — ABNORMAL LOW (ref 36.0–46.0)
Hemoglobin: 8.9 g/dL — ABNORMAL LOW (ref 12.0–15.0)
Immature Granulocytes: 1 %
Lymphocytes Relative: 18 %
Lymphs Abs: 1 10*3/uL (ref 0.7–4.0)
MCH: 28.8 pg (ref 26.0–34.0)
MCHC: 32 g/dL (ref 30.0–36.0)
MCV: 90 fL (ref 80.0–100.0)
Monocytes Absolute: 0.5 10*3/uL (ref 0.1–1.0)
Monocytes Relative: 9 %
Neutro Abs: 3.8 10*3/uL (ref 1.7–7.7)
Neutrophils Relative %: 69 %
Platelets: 196 10*3/uL (ref 150–400)
RBC: 3.09 MIL/uL — ABNORMAL LOW (ref 3.87–5.11)
RDW: 17 % — ABNORMAL HIGH (ref 11.5–15.5)
WBC: 5.4 10*3/uL (ref 4.0–10.5)
nRBC: 0 % (ref 0.0–0.2)

## 2020-05-31 LAB — GLUCOSE, CAPILLARY
Glucose-Capillary: 213 mg/dL — ABNORMAL HIGH (ref 70–99)
Glucose-Capillary: 248 mg/dL — ABNORMAL HIGH (ref 70–99)
Glucose-Capillary: 173 mg/dL — ABNORMAL HIGH (ref 70–99)
Glucose-Capillary: 177 mg/dL — ABNORMAL HIGH (ref 70–99)
Glucose-Capillary: 295 mg/dL — ABNORMAL HIGH (ref 70–99)

## 2020-05-31 LAB — CULTURE, BLOOD (ROUTINE X 2)
Culture: NO GROWTH
Culture: NO GROWTH
Special Requests: ADEQUATE
Special Requests: ADEQUATE

## 2020-05-31 LAB — MAGNESIUM: Magnesium: 1.7 mg/dL (ref 1.7–2.4)

## 2020-05-31 LAB — PHOSPHORUS: Phosphorus: 3.6 mg/dL (ref 2.5–4.6)

## 2020-05-31 MED ORDER — HYDROMORPHONE HCL 1 MG/ML IJ SOLN
1.0000 mg | Freq: Once | INTRAMUSCULAR | Status: AC
  Administered 2020-05-31: 1 mg via INTRAVENOUS
  Filled 2020-05-31: qty 1

## 2020-05-31 MED ORDER — IOHEXOL 300 MG/ML  SOLN
75.0000 mL | Freq: Once | INTRAMUSCULAR | Status: AC | PRN
  Administered 2020-06-01: 75 mL via INTRAVENOUS

## 2020-05-31 NOTE — TOC Progression Note (Signed)
Transition of Care [facility]) - Progression Note    Patient Details  Name: Annette [name] MRN: [id number] Date of Birth: 1960-08-17  Transition of Care [facility]) CM/SW Contact  Cherrie Distance, RN Phone Number: 05/31/2020, 10:14 AM  Clinical Narrative:    Talked to patient via telephone; PCP is [facility] [address], [place], [place]   tele [phone]; Patient lives at home with her family ( son, daughter and 2 grandchildren); DME - walker and wheelchair at home.  Patient is refusing SNF placement and request to return home at discharge. Daleen Squibb LCSW working on [facility] arrangements.  Alexis Goodell Transition of Care Supervisor [phone]   Expected Discharge Plan: Home w Home Health Services Barriers to Discharge: No Home Care Agency will accept this patient  Expected Discharge Plan and Services Expected Discharge Plan: Home w Home Health Services In-house Referral: Clinical Social Work       Expected Discharge Date: 05/21/20               DME Arranged: Hospital bed,Other see comment (hoyer lift) DME Agency: [employer] Date DME Agency Contacted: 05/30/20 Time DME Agency Contacted: [phone] Representative spoke with at DME Agency: Silvio Pate             Social Determinants of Health (SDOH) Interventions    Readmission Risk Interventions No flowsheet data found.

## 2020-05-31 NOTE — Progress Notes (Signed)
PROGRESS NOTE    Annette [name]  VHQ:[id number] DOB: 11/06/1960 DOA: 05/07/2020 PCP: Patient, No Pcp Per    Brief Narrative:  The patient is a 59/F w/ COPD, type 2 diabetes mellitus, Obesity, hypertension and dyslipidemia was admitted to [facility] on 11/19 with MRSA right upper lobe pneumonia, right lower lobe subsegmental pulmonary embolism she was treated with IV antibiotics and Lovenox for anticoagulation, subsequently developed right-sided hydropneumothorax on 11/22 underwent chest tube insertion and was transferred to [facility] for TCTS evaluation. -Upon arrival here, CT chest noted moderately large right pneumothorax, extensive subcutaneous emphysema throughout the right chest wall extending to the neck, had a chest tube placed on 11/24 -Then complicated by hemothorax, acute blood loss anemia, on 11/28 underwent evacuation of hematoma, mechanical pleurodesis and wound VAC placement -Subsequently on 12/3 underwent right chest wall wound closure and removal of wound VAC -Chest tubes removed on 12/ 6 after several days of minimal drainage without air leak -On 12/7 developed acute worsening severe subcutaneous emphysema extending to the neck and face, had another chest tube placed on the right by T CTS -Had persistent right pneumothorax and finally underwent another right chest tube placement in IR on 12/9 -Also seen and followed by palliative care this admission -Transferred from PCCM to [facility] service 12/10 -Right superior chest tube was removed, the proximal port was outside the chest wall -Now slowly improving, has 2 chest tubes to suction and the plan by CT surgery on 12/15 is to have Large-Bore Chest Tube to be removed and place the pigtail catheter to water seal; cardiothoracic surgery feels that there is no recurrence of her pneumothorax or increase in her subcutaneous air she will be able to discharge with the pigtail catheter in place connected to the Pneumostat  collection chamber  05/30/2020 **Patient's large bore chest tube was removed yesterday.  Anticipating discharge in the next 24 to 48 hours and having TOC assist with obtaining home health.  DME hospital bed was ordered.  Patient is still refusing SNF placement.  Cardiothoracic surgery reviewed the patient's chest x-ray and it showed no pneumothorax and no air leak from the remaining pleural catheter.  She is not had any increase in subcutaneous air on exam and they are recommending her work on her pulmonary hygiene.  They are leaving the other catheter to waterseal for now and switching to many collection system closer to the time of discharge and she will need Cardiothoracic Clearance for this. Patient will need to have all on home health equipment arranged prior to her safely discharging.  05/31/20 Overnight her lateral chest wound incision has been closed several days began draining and this increased overnight and consisted of foul-smelling thin maroon fluid.  A CT scan of the chest was obtained by cardiothoracic surgery and a decision was made to remove the staples and sutures in the right chest wound to allow to open the air and drain fall and then applied with wound VAC.  She has not been febrile and her WBC is not elevated but the volume of drainage increased.  She is made n.p.o. and CT of the chest is to be obtained and still pending.  Transitions of care assisting with obtaining home health for the patient  Assessment & Plan:   Principal Problem:   MRSA pneumonia (HCC) Active Problems:   Pulmonary embolism (HCC)   Pneumothorax   Acute hypoxemic respiratory failure (HCC)   COPD (chronic obstructive pulmonary disease) (HCC)   Pressure injury of skin  Palliative care by specialist   Goals of care, counseling/discussion   DNR (do not resuscitate)  Complicated Pneumothorax Extensive subcutaneous emphysema MRSA pneumonia -Completed antibiotic therapy -Initial chest tube placed at  outside hospital on 11/24 followed by repeat chest tube placement on 11/28, both chest tubes were removed on 12/6 with acute worsening subcutaneous emphysema and pneumothorax; She underwent VATS/Thoracotomy Mechanical Pleurodesis with Wound VAC Placement on 11/28 -Repeat chest tube placement on 12/7, and another in IR on 12/9 -Superior most chest tube which was dislodged and contributing to subcutaneous emphysema was removed 12/10 -Now clinically appears stable, both chest tubes remained to suction, repeat chest x-ray without pneumothorax  -Continue management per TCTS; the plan by CT surgery on 12/15 is to have Large-Bore Chest Tube to be removed and place the pigtail catheter to water seal; cardiothoracic surgery feels that there is no recurrence of her pneumothorax or increase in her subcutaneous air she will be able to discharge with the pigtail catheter in place connected to the Pneumostat collection chamber -Large bore chest tube was removed yesterday and she only has 1 remaining  Repeat chest x-ray shows no pneumothorax no air leak from the remaining pleural catheter; there is no increase in subcutaneous air on exam the right lung is not as well aerated and cardiothoracic surgery recommending work on pulmonary hygiene so will obtain incentive stronger and flutter valve -C/w Pain Control with IV Hydromorphone 0.5 mg IV q4hprn Severe Pain and with Oxycodone 5-10 mg po q4hprn Moderate Pain and also has tramadol 50 to 100 mg p.o. every 6 as needed for mild pain -C/w Breathing treatments with Levalbeterol 0.63 mg Neb q6hprn Wheezing and C/w home Umeclidinium Bromide 1 puff IH Daily for now -Started low-dose Lovenox for DVT prophylaxis  -C/w PT/OT and they are recommending SNF but if refusing SNF will need Home Health Maximized -Repeat CXR 05/29/20 AM showed "Cardiac shadow is stable. Diffuse subcutaneous emphysema is again seen but slightly decreased. Right-sided pigtail catheter is again noted. No  pneumothorax is seen. Persistent opacity is noted in the right apex stable from the prior study. No new focal infiltrate is seen. No bony abnormality is noted." -Repeat CXR this AM showed "Chest tube again noted on the right. No pneumothorax. Extensive subcutaneous emphysema remains. Extensive consolidation throughout much of the right lung, particularly more superiorly on the right, persists. Patchy atelectasis on the left. Stable cardiac silhouette."  -Further plans and Management per cardiothoracic surgery and they are following; I spoke to Gershon CraneWayne Gold of TCTS and he states he will speak with Dr. Vickey SagesAtkins about further care and plan; Currently not a safe Discharge until TCTS clears the patient  -On 05/31/2020 her lateral chest wound which has closed several days ago started draining and the amount of drainage increased and was foul-smelling so TCTS recommended a CT scan of the chest near the wound with staple remover as well as sutures and allowing it to open and drain for and then applying the wound VAC  Recent Subsegmental Pulmonary Embolism -CT at outside hospital prior on admission noted right subsegmental PE in addition pneumonia, was initially on full dose Lovenox, then developed acute blood loss anemia and massive chest wall hematoma which required surgical evacuation -SpO2: 100 % O2 Flow Rate (L/min): 2 L/min FiO2 (%): 28 % -Echocardiogram did not note any right heart strain -Anticoagulation subsequently had been on hold, no overt bleeding at this time -Restart anticoagulation when appropriate per TCTS -Started Lovenox for DVT prophylaxis on 12/12 for now  and will defer to TCTS to resume full dose Anticoagulation; Will need them to weigh in on this  -Per TCTS  Acute Bood Loss Anemia -In the setting of hemothorax and intermittent bleeding on anticoagulation with Eliquis  -Received 4 units of PRBC this admission -Hemoglobin stable now at 8.6/27.1 the day before yesterday and yesterday it  is 8.9/26.9; repeat this morning still pending -Continue to Monitor for S/Sx of Bleeding -Repeat CBC in the AM   COPD Hx of COVID PNA -Continue nebs and inhalers as above -Wean O2 as tolerated  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 twice daily  Constipation -Continue with bowel regimen continue with bisacodyl 10 mg p.o. daily, polyethylene glycol 17 g p.o. twice daily as needed for mild constipation, and senna docusate 1 tab p.o. nightly -She states that she had a bowel movement yesterday  HLD -C/w Atorvastatin 40 mg po Daily and Fenofibrate 160 mg po Daily   Obesity -Complicates overall prognosis and Care -Estimated body mass index is 34.02 kg/m as calculated from the following:   Height as of this encounter:  (1.727 m).   Weight as of this encounter: 101.5 kg. -Weight Loss and Dietary Counseling given  -Nutritionist consulted and recommending Snacks BID, Double Protein with Meals and MVI with Minerals Daily  GOC: DNR, poA  DVT prophylaxis: Enoxaparin 40 mg sq q24h Code Status: Partial Code Family Communication: No family present at bedside  Disposition Plan: Need further clinical improvement and clearance by Cardiothoracic Surgery; she has a repeat CT scan pending and underwent an I&D at bedside today  Status is: Inpatient  Remains inpatient appropriate because:Unsafe d/c plan, IV treatments appropriate due to intensity of illness or inability to take PO and Inpatient level of care appropriate due to severity  of illness   Dispo: The patient is from: Home              Anticipated d/c is to: SNF vs. Home Health              Anticipated d/c date is:1-2 days but will defer to TCTS when it is safe to Discharge               Patient currently is not medically stable to d/c.  Consultants:   PCCM Transfer  Cardiothoracic Surgery  Interventional Radiology  Palliative Care Medicine    Procedures:  VATS procedure 05/06/2020 at outside facility 05/20/2020 chest tube removed 05/21/2020 right-sided chest tube placed (CVTS) 05/23/2020 interventional radiology plus anterior chest tube right 05/25/20: CT removal 05/29/20: CT Removal  05/31/2020: She did incision and drainage of the wound on the right lateral chest wound which had closed several days; the staples and sutures from the right chest wound were removed to allow her to open and drain full and then regular thigh wound VAC and a repeat CT chest is pending  Antimicrobials:  Anti-infectives (From admission, onward)   Start     Dose/Rate Route Frequency Ordered Stop   05/17/20 1345  ceFAZolin (ANCEF) IVPB 2g/100 mL premix  Status:  Discontinued        2 g 200 mL/hr over 30 Minutes Intravenous On call 05/17/20 1335 05/18/20 1057   05/17/20 1315  ceFAZolin (ANCEF) powder 1 g  Status:  Discontinued        1 g Other To Surgery 05/17/20 1306 05/17/20 1336   05/10/20 1300  fluconazole (DIFLUCAN) tablet 150 mg        150 mg Oral every 72 hours 05/10/20 1151 05/13/20 1408   05/10/20 0900  Ampicillin-Sulbactam (UNASYN) 3 g in sodium chloride 0.9 % 100 mL IVPB        3 g 200 mL/hr over 30 Minutes Intravenous Every 6 hours 05/10/20 0753 05/17/20 0859   05/08/20 0030  linezolid (ZYVOX) tablet 600 mg  Status:  Discontinued        600 mg Oral Every 12 hours 05/08/20 0015 05/14/20 1113        Subjective: Seen and examined at bedside and again she was talking on the phone entire time out of the room but had no complaints.  Understands that she will be  undergoing a I&D and a repeat CT scan by the TCTS.  She denies any chest pain or shortness breath and thinks her legs are not swollen.  No other concerns or complaints at this time.  Objective: Vitals:   05/31/20 0610 05/31/20 0810 05/31/20 0828 05/31/20 1155  BP:    103/63  Pulse:      Resp:    (!) 21  Temp:  97.8 F (36.6 C)    TempSrc:  Oral    SpO2:   100%   Weight: 101.5 kg     Height:        Intake/Output Summary (Last 24 hours) at 05/31/2020 1515 Last data filed at 05/31/2020 0144 Gross per 24 hour  Intake --  Output 1425 ml  Net -1425 ml   Filed Weights   05/29/20 0650 05/30/20 0600 05/31/20 0610  Weight: 114.2 kg 101.4 kg 101.5 kg   Examination: Physical Exam:  Constitutional: WN/WD obese Caucasian female in NAD and appears calm and comfortable Eyes: Lids and conjunctivae normal, sclerae anicteric  ENMT: External Ears, Nose appear normal. Grossly normal hearing. Mucous membranes are moist.   Neck: Appears normal, supple, no cervical masses, normal ROM, no appreciable thyromegaly; no JVD Respiratory: Diminished to auscultation bilaterally with coarse breath sounds worse on the Right compared to the LEf, no wheezing, rales, rhonchi or crackles. Normal respiratory effort and patient is not tachypenic. No accessory muscle use.  She is wearing supplemental oxygen via nasal cannula and has unlabored breathing but has a chest tube in the right side and her incision is draining some Cardiovascular: RRR, no murmurs / rubs / gallops. S1 and S2 auscultated. No extremity edema. 2+ pedal pulses. No carotid bruits.  Abdomen: Soft, non-tender, distended secondary body habitus. no masses palpated. No appreciable hepatosplenomegaly. Bowel sounds positive.  GU: Deferred. Musculoskeletal: No clubbing / cyanosis of digits/nails. No joint deformity upper and lower extremities.  Skin: No rashes, lesions, ulcers on limited skin evaluation and incisions noted on right axilla. No induration;  Warm and dry.  Neurologic: CN 2-12 grossly intact with no focal deficits. Romberg sign and cerebellar reflexes not assessed.  Psychiatric: Normal judgment and insight. Alert and oriented x 3. Normal mood and appropriate affect.   Data Reviewed: I have personally reviewed following labs and imaging studies  CBC: Recent Labs  Lab 05/25/20 0158 05/25/20 2320 05/26/20 0219 05/27/20 0557 05/28/20 0925 05/29/20 0145  05/30/20 0038  WBC 7.2   < > 7.0 5.2 7.1 6.3 8.0  NEUTROABS 4.9  --  4.5  --   --   --  5.7  HGB 8.7*   < > 8.2* 8.6* 9.4* 8.6* 8.9*  HCT 28.1*   < > 26.6* 27.5* 30.8* 27.1* 26.9*  MCV 94.6   < > 94.3 92.3 93.3 92.8 91.8  PLT 167   < > 177 206 215 187 181   < > = values in this interval not displayed.   Basic Metabolic Panel: Recent Labs  Lab 05/26/20 0219 05/27/20 0557 05/28/20 0925 05/29/20 0145 05/30/20 0038  NA 136 137 136 136 135  K 3.8 3.8 4.4 4.3 4.8  CL 94* 98 97* 100 100  CO2 33* 28 30 28 29   GLUCOSE 225* 118* 160* 226* 215*  BUN 25* 20 13 20 19   CREATININE 0.87 0.63 0.60 0.93 0.74  CALCIUM 8.7* 8.7* 9.0 8.5* 8.6*  MG  --   --   --   --  1.7  PHOS  --   --   --   --  3.3   GFR: Estimated Creatinine Clearance: 93.1 mL/min (by C-G formula based on SCr of 0.74 mg/dL). Liver Function Tests: Recent Labs  Lab 05/30/20 0038  AST 25  ALT 31  ALKPHOS 62  BILITOT 0.4  PROT 5.2*  ALBUMIN 1.7*   No results for input(s): LIPASE, AMYLASE in the last 168 hours. No results for input(s): AMMONIA in the last 168 hours. Coagulation Profile: No results for input(s): INR, PROTIME in the last 168 hours. Cardiac Enzymes: No results for input(s): CKTOTAL, CKMB, CKMBINDEX, TROPONINI in the last 168 hours. BNP (last 3 results) No results for input(s): PROBNP in the last 8760 hours. HbA1C: No results for input(s): HGBA1C in the last 72 hours. CBG: Recent Labs  Lab 05/30/20 2018 05/30/20 2303 05/31/20 0321 05/31/20 0809 05/31/20 1154  GLUCAP 246* 279* 213*  248* 173*   Lipid Profile: No results for input(s): CHOL, HDL, LDLCALC, TRIG, CHOLHDL, LDLDIRECT in the last 72 hours. Thyroid Function Tests: No results for input(s): TSH, T4TOTAL, FREET4, T3FREE, THYROIDAB in the last 72 hours. Anemia Panel: No results for input(s): VITAMINB12, FOLATE, FERRITIN, TIBC, IRON, RETICCTPCT in the last 72 hours. Sepsis Labs: Recent Labs  Lab 05/25/20 2320  PROCALCITON 0.26  LATICACIDVEN 1.0    Recent Results (from the past 240 hour(s))  Culture, blood (routine x 2)     Status: None   Collection Time: 05/25/20 11:30 PM   Specimen: BLOOD  Result Value Ref Range Status   Specimen Description BLOOD RIGHT ANTECUBITAL  Final   Special Requests   Final    BOTTLES DRAWN AEROBIC AND ANAEROBIC Blood Culture adequate volume   Culture   Final    NO GROWTH 6 DAYS Performed at [facility], [address] [place], [address] [place]    Report Status 05/31/2020 FINAL  Final  Culture, blood (routine x 2)     Status: None   Collection Time: 05/25/20 11:40 PM   Specimen: BLOOD RIGHT HAND  Result Value Ref Range Status   Specimen Description BLOOD RIGHT HAND  Final   Special Requests   Final    BOTTLES DRAWN AEROBIC AND ANAEROBIC Blood Culture adequate volume   Culture   Final    NO GROWTH 6 DAYS Performed at [facility], [address] [place], [address] [place]    Report Status 05/31/2020 FINAL  Final     RN Pressure Injury Documentation: Pressure Injury 05/07/20 Coccyx Medial;Mid Stage 2 -  Partial thickness loss of dermis presenting as a shallow open injury with a red, pink wound bed without slough. (Active)  05/07/20 2100  Location: Coccyx  Location Orientation: Medial;Mid  Staging: Stage 2 -  Partial thickness loss of dermis presenting as a shallow open injury with a red, pink wound bed without slough.  Wound Description (Comments):   Present on Admission: Yes   Estimated body mass index is 34.02 kg/m as calculated from the  following:   Height as of this encounter: 5\' 8"  (1.727 m).   Weight as of this encounter: 101.5 kg.  Malnutrition Type:  Nutrition Problem: Increased nutrient needs Etiology: wound healing,acute illness (recent COVID PNA)  Malnutrition Characteristics:  Signs/Symptoms: estimated needs  Nutrition Interventions:  Interventions: MVI,Ensure Enlive (each supplement provides 350kcal and 20 grams of protein)  Radiology Studies: DG CHEST PORT 1 VIEW  Result Date: 05/31/2020 CLINICAL DATA:  Pneumothorax. EXAM: PORTABLE CHEST 1 VIEW COMPARISON:  05/30/2020 FINDINGS: The cardiomediastinal silhouette is unchanged. A right-sided chest tube is unchanged, and there is persistent extensive subcutaneous emphysema. No pneumothorax is identified. Extensive airspace opacity in the right lung including dense consolidation in the upper lung has not significantly changed. Mild left mid and lower lung opacity is also unchanged and likely represents atelectasis. There may be a right pleural effusion. IMPRESSION: 1. Unchanged right chest tube without evidence of pneumothorax. 2. Unchanged right lung airspace disease. Electronically Signed   By: 06/01/2020 M.D.   On: 05/31/2020 08:32   DG CHEST PORT 1 VIEW  Result Date: 05/30/2020 CLINICAL DATA:  Chest tube present.  Recent pneumothorax. EXAM: PORTABLE CHEST 1 VIEW COMPARISON:  May 29, 2020 FINDINGS: Chest tube again noted on the right. No appreciable pneumothorax. Extensive subcutaneous emphysema remains. There is extensive airspace opacity throughout much of the right lung, stable. Mild atelectasis left mid and lower lung regions. Heart size and pulmonary vascularity normal. No adenopathy. No appreciable bone lesions. IMPRESSION: Chest tube again noted on the right. No pneumothorax. Extensive subcutaneous emphysema remains. Extensive consolidation throughout much of the right lung, particularly more superiorly on the right, persists. Patchy atelectasis on  the left. Stable cardiac silhouette. Electronically Signed   By: May 31, 2020 III M.D.   On: 05/30/2020 08:10   Scheduled Meds:  atorvastatin  40 mg Oral Daily   bisacodyl  10 mg Oral Daily   Chlorhexidine Gluconate Cloth  6 each Topical Daily   cholecalciferol  1,000 Units Oral Daily   clonazePAM  0.5 mg Oral BID   enoxaparin (LOVENOX) injection  40 mg Subcutaneous Q24H   fenofibrate  160 mg Oral Daily   Gerhardt's butt cream   Topical BID   guaiFENesin  600 mg Oral BID   insulin aspart  0-15 Units Subcutaneous TID WC   insulin aspart  3 Units Subcutaneous TID WC   insulin glargine  25 Units Subcutaneous QHS   lidocaine  1 patch Transdermal Q24H   multivitamin with minerals  1 tablet Oral Daily   nystatin   Topical BID   pregabalin  150 mg Oral TID   senna-docusate  1 tablet Oral QHS   sodium chloride flush  10-40 mL Intracatheter Q12H   umeclidinium bromide  1 puff Inhalation Daily   Continuous Infusions:   LOS: 24 days   06/01/2020, DO [facility] PAGER is on AMION  If 7PM-7AM, please contact night-coverage [email or web address]

## 2020-05-31 NOTE — Procedures (Signed)
  The right lateral chest wound, which had been closed several days ago after being treated with a wound VAC for 5 days, began draining yesterday.  This increased overnight and consisted of foul-smelling thin maroon fluid.  This finding was discussed with Dr. Vickey Sages.  The decision was made to remove the staples and sutures from the right chest wound to allow it to open and drain full  and then apply wound VAC.  After the procedure was explained to the patient, she was given Dilaudid 1 mg.  She was positioned onto her left side with the assistance of her RN.  The skin staples and sutures were removed from the 2 inch long right lateral chest wound.  It drained about 50 mL of old maroon serosanguineous fluid.  It was malodorous.  A wound culture was obtained.  The wound was gently probed with a forceps to allow it to drain completely.  The wound was hemostatic.  The open wound measures about 3 cm in length by 2 cm in width by 7 cm in depth.  A wound VAC sponge was then fashioned in the proper dimension and was gently inserted into the wound.  Surrounding skin was prepped with a No Sting barrier and the wound VAC drape was applied followed by the negative pressure disc and tubing.  This was connected to the the vacuum controller which was set to -75 mmHg.  The device functioned properly with no leaks.  With the assistance of the nurse.  Annette [name] was repositioned in her bed.  She remained comfortable through the procedure.   Jillyn Hidden, PA-C

## 2020-05-31 NOTE — Progress Notes (Signed)
PT Cancellation Note  Patient Details Name: Annette [name] MRN: [id number] DOB: 1961/04/16   Cancelled Treatment:    Reason Eval/Treat Not Completed: Patient declined, no reason specified (pt refuses to perform any mobility stating it is too early. Pt educated for need for mobility and sliding board,rollator present as previously discussed with pt. She has still not been OOB and refusing SNF.Hospital bed and hoyer lift recommended.)   Michel Hendon B Sheika Coutts 05/31/2020, 9:03 AM  Merryl Hacker, PT Acute Rehabilitation Services Pager: [phone] Office: [phone]

## 2020-05-31 NOTE — TOC Progression Note (Addendum)
Transition of Care [facility]) - Progression Note    Patient Details  Name: Annette [name] MRN: [id number] Date of Birth: 1961/05/31  Transition of Care [facility]) CM/SW Contact  Lorri Frederick, LCSW Phone Number: 05/31/2020, 9:52 AM  Clinical Narrative:   Brunetta Jeans [facility] declines pt due to staffing.  CSW communicated with [facility] supervisors, recommended to send referral to [facility]; [phone],, fax [phone].  Referral sent.   1520: [facility] cannot accept referral due to payer: [facility] Medicare   Expected Discharge Plan: Home w Home Health Services Barriers to Discharge: No Home Care Agency will accept this patient  Expected Discharge Plan and Services Expected Discharge Plan: Home w Home Health Services In-house Referral: Clinical Social Work       Expected Discharge Date: 05/21/20               DME Arranged: Hospital bed,Other see comment (hoyer lift) DME Agency: [employer] Date DME Agency Contacted: 05/30/20 Time DME Agency Contacted: [phone] Representative spoke with at DME Agency: Silvio Pate             Social Determinants of Health (SDOH) Interventions    Readmission Risk Interventions No flowsheet data found.

## 2020-05-31 NOTE — Progress Notes (Signed)
OT Cancellation Note  Patient Details Name: Annette [name] MRN: [id number] DOB: 1960/10/10   Cancelled Treatment:    Reason Eval/Treat Not Completed: Other (comment) pt declined OT session and OOB mobility reporting "she has too much going on" per RN pt to go for wound debridement. Will check back as time allows for OT session.   Audery Amel., COTA/L Acute Rehabilitation Services [phone]     Angelina Pih 05/31/2020, 1:13 PM

## 2020-05-31 NOTE — Progress Notes (Signed)
Lab called to notify that the culture swab could only give aerobic results. To get an anaerobic culture, it has to be drawn through a needle, muscle sample, etc. PA paged.

## 2020-05-31 NOTE — Progress Notes (Addendum)
[address]       [place] [id number]             [phone]       14 Days Post-Op Procedure(s) (LRB): RIGHT CHEST WALL WOUND CLOSURE REMOVAL OF WOUND VAC (Right) Subjective: Awake and alert, says she feels good today.  No new complaints and no changes in her respirations.   Objective: Vital signs in last 24 hours: Temp:  [97.8 F (36.6 C)-99.3 F (37.4 C)] 97.8 F (36.6 C) (12/17 0810) Pulse Rate:  [90-99] 90 (12/17 0319) Cardiac Rhythm: Normal sinus rhythm (12/17 0704) Resp:  [16-18] 16 (12/17 0319) BP: (105-119)/(62-68) 119/67 (12/17 0319) SpO2:  [91 %-100 %] 100 % (12/17 0828) FiO2 (%):  [28 %] 28 % (12/17 0828) Weight:  [101.5 kg] 101.5 kg (12/17 0610)     Intake/Output from previous day: 12/16 0701 - 12/17 0700 In: -  Out: 2125 [Urine:2125] Intake/Output this shift: No intake/output data recorded.  General appearance: alert, cooperative and no distress Heart: regular rate and rhythm Lungs: Minimal palpable subQ air.  No air leak from the remaining pigtail chest tube with thin serous fluid in the tubing. Total volume of drainage not recorded.   Abdomen: benign Wound: the right chest wound closure site had more dark, bloody drainage past 24 hours.  The ABD dressing was mostly saturated this morning with foul-smelling maroon drainage.  The skin edges remain approximated with sutures and staples.   Lab Results: Recent Labs    05/29/20 0145 05/30/20 0038  WBC 6.3 8.0  HGB 8.6* 8.9*  HCT 27.1* 26.9*  PLT 187 181   BMET:  Recent Labs    05/29/20 0145 05/30/20 0038  NA 136 135  K 4.3 4.8  CL 100 100  CO2 28 29  GLUCOSE 226* 215*  BUN 20 19  CREATININE 0.93 0.74  CALCIUM 8.5* 8.6*    PT/INR: No results for input(s): LABPROT, INR in the last 72 hours. ABG    Component Value Date/Time   PHART 7.400 05/12/2020 1524   HCO3 36.1 (H) 05/12/2020 1524   TCO2 38 (H) 05/12/2020 1524   O2SAT 99.0 05/12/2020 1524   CBG (last 3)   Recent Labs    05/30/20 2303 05/31/20 0321 05/31/20 0809  GLUCAP 279* 213* 248*    Meds Scheduled Meds: . atorvastatin  40 mg Oral Daily  . bisacodyl  10 mg Oral Daily  . Chlorhexidine Gluconate Cloth  6 each Topical Daily  . cholecalciferol  1,000 Units Oral Daily  . clonazePAM  0.5 mg Oral BID  . enoxaparin (LOVENOX) injection  40 mg Subcutaneous Q24H  . fenofibrate  160 mg Oral Daily  . Gerhardt's butt cream   Topical BID  . guaiFENesin  600 mg Oral BID  . insulin aspart  0-15 Units Subcutaneous TID WC  . insulin aspart  3 Units Subcutaneous TID WC  . insulin glargine  25 Units Subcutaneous QHS  . lidocaine  1 patch Transdermal Q24H  . multivitamin with minerals  1 tablet Oral Daily  . nystatin   Topical BID  . pregabalin  150 mg Oral TID  . senna-docusate  1 tablet Oral QHS  . sodium chloride flush  10-40 mL Intracatheter Q12H  . umeclidinium bromide  1 puff Inhalation Daily   Continuous Infusions: PRN Meds:.acetaminophen, HYDROmorphone (DILAUDID) injection, influenza vac split quadrivalent PF, levalbuterol, magnesium citrate, ondansetron (ZOFRAN) IV, oxyCODONE, pneumococcal 23 valent vaccine, polyethylene glycol, sodium chloride flush,  traMADol  Xrays DG CHEST PORT 1 VIEW  Result Date: 05/31/2020 CLINICAL DATA:  Pneumothorax. EXAM: PORTABLE CHEST 1 VIEW COMPARISON:  05/30/2020 FINDINGS: The cardiomediastinal silhouette is unchanged. A right-sided chest tube is unchanged, and there is persistent extensive subcutaneous emphysema. No pneumothorax is identified. Extensive airspace opacity in the right lung including dense consolidation in the upper lung has not significantly changed. Mild left mid and lower lung opacity is also unchanged and likely represents atelectasis. There may be a right pleural effusion. IMPRESSION: 1. Unchanged right chest tube without evidence of pneumothorax. 2. Unchanged right lung airspace disease. Electronically Signed   By: Sebastian Ache M.D.   On:  05/31/2020 08:32   DG CHEST PORT 1 VIEW  Result Date: 05/30/2020 CLINICAL DATA:  Chest tube present.  Recent pneumothorax. EXAM: PORTABLE CHEST 1 VIEW COMPARISON:  May 29, 2020 FINDINGS: Chest tube again noted on the right. No appreciable pneumothorax. Extensive subcutaneous emphysema remains. There is extensive airspace opacity throughout much of the right lung, stable. Mild atelectasis left mid and lower lung regions. Heart size and pulmonary vascularity normal. No adenopathy. No appreciable bone lesions. IMPRESSION: Chest tube again noted on the right. No pneumothorax. Extensive subcutaneous emphysema remains. Extensive consolidation throughout much of the right lung, particularly more superiorly on the right, persists. Patchy atelectasis on the left. Stable cardiac silhouette. Electronically Signed   By: Bretta Bang III M.D.   On: 05/30/2020 08:10    Assessment/Plan: S/P Procedure(s) (LRB): RIGHT CHEST WALL WOUND CLOSURE REMOVAL OF WOUND VAC (Right)  -POD19 evacuation of right chest wall hematoma after CT placement at OSH.  -POD-14 removal of right chest wound vac and closure of right chest wound.  -POD-9,10 -replacement of right chest tubes x 2 for recurrent right PTX with massive subQ air.  -POD-8 CT guided placement of a right pigtail catheter by IR for persistent PTX. This tube remains in place.    -No PTX, no air leak from remaining pleural catheter. No increase in the subQ air on exam but the right lung is still not  well aerated. Need to continue to work on pulmonary hygiene. Leave the catheter to water seal for now and switch to a mini collection system closer to the time of discharge.   -Wound drainage- She is not febrile and WBC is not elevated but the volume of drainage has increased and is now foul-smelling.  Suspect this wound will require I&D and probably go back to management with a wound vac. Will discuss with Dr. Vickey Sages. NPO for now. CT chest this AM.   I LOS: 24  days    Leary Roca PA-C Pager [phone] 05/31/2020

## 2020-06-01 ENCOUNTER — Inpatient Hospital Stay (HOSPITAL_COMMUNITY): Payer: Medicare Other

## 2020-06-01 LAB — PHOSPHORUS: Phosphorus: 4 mg/dL (ref 2.5–4.6)

## 2020-06-01 LAB — COMPREHENSIVE METABOLIC PANEL
ALT: 36 U/L (ref 0–44)
AST: 29 U/L (ref 15–41)
Albumin: 1.7 g/dL — ABNORMAL LOW (ref 3.5–5.0)
Alkaline Phosphatase: 66 U/L (ref 38–126)
Anion gap: 8 (ref 5–15)
BUN: 19 mg/dL (ref 6–20)
CO2: 28 mmol/L (ref 22–32)
Calcium: 8.7 mg/dL — ABNORMAL LOW (ref 8.9–10.3)
Chloride: 99 mmol/L (ref 98–111)
Creatinine, Ser: 0.63 mg/dL (ref 0.44–1.00)
GFR, Estimated: >60 mL/min (ref >60)
Glucose, Bld: 271 mg/dL — ABNORMAL HIGH (ref 70–99)
Potassium: 4.6 mmol/L (ref 3.5–5.1)
Sodium: 135 mmol/L (ref 135–145)
Total Bilirubin: 0.5 mg/dL (ref 0.3–1.2)
Total Protein: 5.4 g/dL — ABNORMAL LOW (ref 6.5–8.1)

## 2020-06-01 LAB — CBC WITH DIFFERENTIAL/PLATELET
Abs Immature Granulocytes: 0.06 10*3/uL (ref 0.00–0.07)
Basophils Absolute: 0 10*3/uL (ref 0.0–0.1)
Basophils Relative: 1 %
Eosinophils Absolute: 0.1 10*3/uL (ref 0.0–0.5)
Eosinophils Relative: 3 %
HCT: 28.7 % — ABNORMAL LOW (ref 36.0–46.0)
Hemoglobin: 8.7 g/dL — ABNORMAL LOW (ref 12.0–15.0)
Immature Granulocytes: 1 %
Lymphocytes Relative: 24 %
Lymphs Abs: 1.2 10*3/uL (ref 0.7–4.0)
MCH: 28 pg (ref 26.0–34.0)
MCHC: 30.3 g/dL (ref 30.0–36.0)
MCV: 92.3 fL (ref 80.0–100.0)
Monocytes Absolute: 0.6 10*3/uL (ref 0.1–1.0)
Monocytes Relative: 12 %
Neutro Abs: 2.9 10*3/uL (ref 1.7–7.7)
Neutrophils Relative %: 59 %
Platelets: 189 10*3/uL (ref 150–400)
RBC: 3.11 MIL/uL — ABNORMAL LOW (ref 3.87–5.11)
RDW: 16.8 % — ABNORMAL HIGH (ref 11.5–15.5)
WBC: 4.8 10*3/uL (ref 4.0–10.5)
nRBC: 0 % (ref 0.0–0.2)

## 2020-06-01 LAB — GLUCOSE, CAPILLARY
Glucose-Capillary: 196 mg/dL — ABNORMAL HIGH (ref 70–99)
Glucose-Capillary: 223 mg/dL — ABNORMAL HIGH (ref 70–99)
Glucose-Capillary: 241 mg/dL — ABNORMAL HIGH (ref 70–99)
Glucose-Capillary: 266 mg/dL — ABNORMAL HIGH (ref 70–99)
Glucose-Capillary: 485 mg/dL — ABNORMAL HIGH (ref 70–99)

## 2020-06-01 LAB — MAGNESIUM: Magnesium: 1.6 mg/dL — ABNORMAL LOW (ref 1.7–2.4)

## 2020-06-01 MED ORDER — MAGNESIUM SULFATE 2 GM/50ML IV SOLN
2.0000 g | Freq: Once | INTRAVENOUS | Status: AC
  Administered 2020-06-01: 2 g via INTRAVENOUS
  Filled 2020-06-01: qty 50

## 2020-06-01 NOTE — Plan of Care (Signed)
  Problem: Education: Goal: Knowledge of General Education information will improve Description: Including pain rating scale, medication(s)/side effects and non-pharmacologic comfort measures Outcome: Progressing   Problem: Health Behavior/Discharge Planning: Goal: Ability to manage health-related needs will improve Outcome: Progressing   Problem: Clinical Measurements: Goal: Ability to maintain clinical measurements within normal limits will improve Outcome: Progressing Goal: Will remain free from infection Outcome: Progressing Goal: Diagnostic test results will improve Outcome: Progressing Goal: Respiratory complications will improve Outcome: Progressing Goal: Cardiovascular complication will be avoided Outcome: Progressing   Problem: Health Behavior/Discharge Planning: Goal: Ability to manage health-related needs will improve Outcome: Progressing   

## 2020-06-01 NOTE — Plan of Care (Signed)

## 2020-06-01 NOTE — Progress Notes (Signed)
PROGRESS NOTE    Yariela Tison  ZOX:[id number] DOB: 1961-05-08 DOA: 05/07/2020 PCP: Patient, No Pcp Per    Brief Narrative:  The patient is a 59/F w/ COPD, type 2 diabetes mellitus, Obesity, hypertension and dyslipidemia was admitted to [facility] on 11/19 with MRSA right upper lobe pneumonia, right lower lobe subsegmental pulmonary embolism she was treated with IV antibiotics and Lovenox for anticoagulation, subsequently developed right-sided hydropneumothorax on 11/22 underwent chest tube insertion and was transferred to [facility] for TCTS evaluation. -Upon arrival here, CT chest noted moderately large right pneumothorax, extensive subcutaneous emphysema throughout the right chest wall extending to the neck, had a chest tube placed on 11/24 -Then complicated by hemothorax, acute blood loss anemia, on 11/28 underwent evacuation of hematoma, mechanical pleurodesis and wound VAC placement -Subsequently on 12/3 underwent right chest wall wound closure and removal of wound VAC -Chest tubes removed on 12/ 6 after several days of minimal drainage without air leak -On 12/7 developed acute worsening severe subcutaneous emphysema extending to the neck and face, had another chest tube placed on the right by T CTS -Had persistent right pneumothorax and finally underwent another right chest tube placement in IR on 12/9 -Also seen and followed by palliative care this admission -Transferred from PCCM to [facility] service 12/10 -Right superior chest tube was removed, the proximal port was outside the chest wall -Now slowly improving, has 2 chest tubes to suction and the plan by CT surgery on 12/15 is to have Large-Bore Chest Tube to be removed and place the pigtail catheter to water seal; cardiothoracic surgery feels that there is no recurrence of her pneumothorax or increase in her subcutaneous air she will be able to discharge with the pigtail catheter in place connected to the Pneumostat  collection chamber  05/30/2020 **Patient's large bore chest tube was removed yesterday.  Anticipating discharge in the next 24 to 48 hours and having TOC assist with obtaining home health.  DME hospital bed was ordered.  Patient is still refusing SNF placement.  Cardiothoracic surgery reviewed the patient's chest x-ray and it showed no pneumothorax and no air leak from the remaining pleural catheter.  She is not had any increase in subcutaneous air on exam and they are recommending her work on her pulmonary hygiene.  They are leaving the other catheter to waterseal for now and switching to many collection system closer to the time of discharge and she will need Cardiothoracic Clearance for this. Patient will need to have all on home health equipment arranged prior to her safely discharging.  05/31/20 Overnight her lateral chest wound incision has been closed several days began draining and this increased overnight and consisted of foul-smelling thin maroon fluid.  A CT scan of the chest was obtained by cardiothoracic surgery and a decision was made to remove the staples and sutures in the right chest wound to allow to open the air and drain fall and then applied with wound VAC.  She has not been febrile and her WBC is not elevated but the volume of drainage increased.  She is made n.p.o. and CT of the chest is to be obtained and still pending.  Transitions of care assisting with obtaining home health for the patient  06/01/20: Patient appears comfortable and had a wound VAC placement yesterday.  Chest x-ray shows no change and there is no air lea noted.  Cardiothoracic surgery recommending to evaluate the wound VAC and probably change in the early week to reevaluate  Assessment & Plan:   Principal Problem:   MRSA pneumonia (HCC) Active Problems:   Pulmonary embolism (HCC)   Pneumothorax   Acute hypoxemic respiratory failure (HCC)   COPD (chronic obstructive pulmonary disease) (HCC)   Pressure  injury of skin   Palliative care by specialist   Goals of care, counseling/discussion   DNR (do not resuscitate)  Complicated Pneumothorax Extensive subcutaneous emphysema MRSA pneumonia -Completed antibiotic therapy -Initial chest tube placed at outside hospital on 11/24 followed by repeat chest tube placement on 11/28, both chest tubes were removed on 12/6 with acute worsening subcutaneous emphysema and pneumothorax; She underwent VATS/Thoracotomy Mechanical Pleurodesis with Wound VAC Placement on 11/28 -Repeat chest tube placement on 12/7, and another in IR on 12/9 -Superior most chest tube which was dislodged and contributing to subcutaneous emphysema was removed 12/10 -Now clinically appears stable, both chest tubes remained to suction, repeat chest x-ray without pneumothorax  -Continue management per TCTS; the plan by CT surgery on 12/15 is to have Large-Bore Chest Tube to be removed and place the pigtail catheter to water seal; cardiothoracic surgery feels that there is no recurrence of her pneumothorax or increase in her subcutaneous air she will be able to discharge with the pigtail catheter in place connected to the Pneumostat collection chamber -Large bore chest tube was removed yesterday and she only has 1 remaining  Repeat chest x-ray shows no pneumothorax no air leak from the remaining pleural catheter; there is no increase in subcutaneous air on exam the right lung is not as well aerated and cardiothoracic surgery recommending work on pulmonary hygiene so will obtain incentive stronger and flutter valve -Pain Control with IV Hydromorphone 0.5 mg IV q4hprn Severe Pain now discontinued as she appears very comfortable despite asking for IV pain meds -C/w oral Analgesics with Oxycodone 5-10 mg po q4hprn Moderate Pain and also has tramadol 50 to 100 mg p.o. every 6 as needed for mild pain -C/w Breathing treatments with Levalbeterol 0.63 mg Neb q6hprn Wheezing and C/w home Umeclidinium  Bromide 1 puff IH Daily for now -Started low-dose Lovenox for DVT prophylaxis  -C/w PT/OT and they are recommending SNF but if refusing SNF will need Home Health Maximized -Further plans and Management per cardiothoracic surgery and they are following; I spoke to Gershon Crane of TCTS and he states he will speak with Dr. Vickey Sages about further care and plan; Currently not a safe Discharge until TCTS clears the patient  -On 05/31/2020 her lateral chest wound which has closed several days ago started draining and the amount of drainage increased and was foul-smelling so TCTS recommended a CT scan of the chest near the wound with staple remover as well as sutures and allowing it to open and drain for and then applying the wound VAC -Wound VAC back in place -Repeat CT Scan 06/01/20 showed "Mild amount of residual pulmonary embolism involving a lower lobe branch of the right pulmonary artery. Right-sided chest tube positioning, as described above, with a stable moderate sized partially loculated right pleural effusion. Stable extensive right upper lobe, right middle lobe and right lower lobe airspace disease. Interval resolution of the bilateral pneumothoraces seen on the prior study, with residual moderate severity pneumomediastinum. Marked severity subcutaneous emphysema. 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 Stable right hepatic lobe cyst. Aortic atherosclerosis." -CXR 06/01/20 showed "No significant change in small layering right pleural effusion, and right upper lobe pulmonary consolidation and volume loss. Extensive chest wall subcutaneous emphysema. No pneumothorax visualized." -Cardiothoracic  Surgery reviewed her imaging and there is no air leak but with heart problems they are recommending leaving the wound VAC in place and probably changing early in a week to reevaluate  Recent Subsegmental Pulmonary Embolism -CT at outside hospital prior on admission noted  right subsegmental PE in addition pneumonia, was initially on full dose Lovenox, then developed acute blood loss anemia and massive chest wall hematoma which required surgical evacuation -SpO2: 94 % O2 Flow Rate (L/min): 2 L/min FiO2 (%): 28 % -Echocardiogram did not note any right heart strain -Anticoagulation subsequently had been on hold, no overt bleeding at this time -Restart anticoagulation when appropriate per TCTS -Repeat CT scan above showed a minimal amount of residual pulmonary embolism involving a lower lobe branch of the right pulmonary artery -Started Lovenox for DVT prophylaxis on 12/12 for now and will defer to TCTS to resume full dose Anticoagulation; Will need them to weigh in on this  -Per TCTS  Acute Bood Loss Anemia -In the setting of hemothorax and intermittent bleeding on anticoagulation with Eliquis  -Received 4 units of PRBC this admission -Hemoglobin stable now at 8.7/20.7 -Continue to Monitor for S/Sx of Bleeding -Repeat CBC in the AM   COPD Hx of COVID PNA -Continue nebs and inhalers as above -Wean O2 as tolerated  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 twice daily  Constipation -Continue with bowel regimen continue with bisacodyl 10 mg p.o. daily, polyethylene glycol 17 g p.o. twice daily as needed for mild constipation, and senna docusate 1 tab p.o. nightly -She states that she had a bowel movement  yesterday  HLD -C/w Atorvastatin 40 mg po Daily and Fenofibrate 160 mg po Daily   Obesity -Complicates overall prognosis and Care -Estimated body mass index is 34.02 kg/m as calculated from the following:   Height as of this encounter:  (1.727 m).   Weight as of this encounter: 101.5 kg. -Weight Loss and Dietary Counseling given  -Nutritionist consulted and recommending Snacks BID, Double Protein with Meals and MVI with Minerals Daily  GOC: DNR, poA  DVT prophylaxis: Enoxaparin 40 mg sq q24h Code Status: Partial Code Family Communication: No family present at bedside  Disposition Plan: Need further clinical improvement and clearance by Cardiothoracic Surgery; she has a repeat CT scan pending and underwent an I&D at bedside today  Status is: Inpatient  Remains inpatient appropriate because:Unsafe d/c plan, IV treatments appropriate due to intensity of illness or inability to take PO and Inpatient level of care appropriate due to severity of illness   Dispo: The patient is from: Home              Anticipated d/c is to: SNF vs. Home Health              Anticipated d/c date is:1-2 days but will defer to TCTS when it is safe to Discharge               Patient currently is  not medically stable to d/c.  Consultants:   PCCM Transfer  Cardiothoracic Surgery  Interventional Radiology  Palliative Care Medicine    Procedures:  VATS procedure 05/06/2020 at outside facility 05/20/2020 chest tube removed 05/21/2020 right-sided chest tube placed (CVTS) 05/23/2020 interventional radiology plus anterior chest tube right 05/25/20: CT removal 05/29/20: CT Removal  05/31/2020: She did incision and drainage of the wound on the right lateral chest wound which had closed several days; the staples and sutures from the right chest wound were removed to allow her to open and drain full and then regular thigh wound VAC and a repeat CT chest is pending  Antimicrobials:  Anti-infectives  (From admission, onward)   Start     Dose/Rate Route Frequency Ordered Stop   05/17/20 1345  ceFAZolin (ANCEF) IVPB 2g/100 mL premix  Status:  Discontinued        2 g 200 mL/hr over 30 Minutes Intravenous On call 05/17/20 1335 05/18/20 1057   05/17/20 1315  ceFAZolin (ANCEF) powder 1 g  Status:  Discontinued        1 g Other To Surgery 05/17/20 1306 05/17/20 1336   05/10/20 1300  fluconazole (DIFLUCAN) tablet 150 mg        150 mg Oral every 72 hours 05/10/20 1151 05/13/20 1408   05/10/20 0900  Ampicillin-Sulbactam (UNASYN) 3 g in sodium chloride 0.9 % 100 mL IVPB        3 g 200 mL/hr over 30 Minutes Intravenous Every 6 hours 05/10/20 0753 05/17/20 0859   05/08/20 0030  linezolid (ZYVOX) tablet 600 mg  Status:  Discontinued        600 mg Oral Every 12 hours 05/08/20 0015 05/14/20 1113        Subjective: Seen and examined at bedside and she is resting and awoken from her sleep and in no acute distress appears calm and comfortable.  States that her shortness of breath is stable and that her legs are doing okay.  Denies any nausea or vomiting and pain is relatively well controlled right now.  Back on a Wound VAC.   Objective: Vitals:   06/01/20 0725 06/01/20 0827 06/01/20 1151 06/01/20 1226  BP:  119/62 107/74   Pulse:  96 90   Resp:  17 17   Temp:  99.1 F (37.3 C) 98.6 F (37 C)   TempSrc:  Oral Oral   SpO2: 95% 97% 96% 94%  Weight:      Height:        Intake/Output Summary (Last 24 hours) at 06/01/2020 1503 Last data filed at 06/01/2020 1300 Gross per 24 hour  Intake 50 ml  Output 0 ml  Net 50 ml   Filed Weights   05/29/20 0650 05/30/20 0600 05/31/20 0610  Weight: 114.2 kg 101.4 kg 101.5 kg   Examination: Physical Exam:  Constitutional: WN/WD OBESE CAUCASIAN FEMALE CURRENTLY IN NO ACUTE DISTRESS APPEARS CALM COMFORTABLE AND AWOKEN FROM HER SLEEP.   Eyes: Lids and conjunctivae normal, sclerae anicteric  ENMT: External Ears, Nose appear normal. Grossly normal hearing.   Neck: Appears normal, supple, no cervical masses, normal ROM, no appreciable thyromegaly; no JVD Respiratory: Diminished to auscultation bilaterally with coarse breath sounds worse on the Rigth compared to the Left with some rhonchi and slight crackles. No wheezing, rales. Normal respiratory effort and patient is not tachypenic. No accessory muscle use. Wearing 2 liters of Supplemental O2 and has a Chest tube in place and a Right Axillary sight connected to wound VAC.  Cardiovascular: RRR, no murmurs / rubs / gallops. S1 and S2 auscultated. No extremity edema.  Abdomen: Soft, non-tender, Distended 2/2 to body habitus. Bowel sounds positive x4.  GU: Deferred. Musculoskeletal: No clubbing / cyanosis of digits/nails. No joint deformity upper and lower extremities.  Skin: No rashes, lesions, ulcers on a limited skin evaluation. No induration; Warm and dry.  Neurologic: CN 2-12 grossly intact with no focal deficits. Romberg sign and cerebellar reflexes not assessed.  Psychiatric: Normal judgment and insight. Alert and oriented x 3. Normal mood and appropriate affect.   Data Reviewed: I have personally reviewed following labs and imaging studies  CBC: Recent Labs  Lab 05/26/20 0219 05/27/20 0557 05/28/20 0925 05/29/20 0145 05/30/20 0038 05/31/20 1801 06/01/20 0238  WBC 7.0   < > 7.1 6.3 8.0 5.4 4.8  NEUTROABS 4.5  --   --   --  5.7 3.8 2.9  HGB 8.2*   < > 9.4* 8.6* 8.9* 8.9* 8.7*  HCT 26.6*   < > 30.8* 27.1* 26.9* 27.8* 28.7*  MCV 94.3   < > 93.3 92.8 91.8 90.0 92.3  PLT 177   < > 215 187 181 196 189   < > = values in this interval not displayed.   Basic Metabolic Panel: Recent Labs  Lab 05/28/20 0925 05/29/20 0145 05/30/20 0038 05/31/20 1801 06/01/20 0238  NA 136 136 135 135 135  K 4.4 4.3 4.8 4.3 4.6  CL 97* 100 100 98 99  CO2 30 28 29 28 28   GLUCOSE 160* 226* 215* 329* 271*  BUN 13 20 19 17 19   CREATININE 0.60 0.93 0.74 0.66 0.63  CALCIUM 9.0 8.5* 8.6* 8.6* 8.7*  MG  --    --  1.7 1.7 1.6*  PHOS  --   --  3.3 3.6 4.0   GFR: Estimated Creatinine Clearance: 93.1 mL/min (by C-G formula based on SCr of 0.63 mg/dL). Liver Function Tests: Recent Labs  Lab 05/30/20 0038 05/31/20 1801 06/01/20 0238  AST 25 24 29   ALT 31 32 36  ALKPHOS 62 68 66  BILITOT 0.4 0.5 0.5  PROT 5.2* 5.6* 5.4*  ALBUMIN 1.7* 1.7* 1.7*   No results for input(s): LIPASE, AMYLASE in the last 168 hours. No results for input(s): AMMONIA in the last 168 hours. Coagulation Profile: No results for input(s): INR, PROTIME in the last 168 hours. Cardiac Enzymes: No results for input(s): CKTOTAL, CKMB, CKMBINDEX, TROPONINI in the last 168 hours. BNP (last 3 results) No results for input(s): PROBNP in the last 8760 hours. HbA1C: No results for input(s): HGBA1C in the last 72 hours. CBG: Recent Labs  Lab 05/31/20 1625 05/31/20 2021 06/01/20 0029 06/01/20 0824 06/01/20 1150  GLUCAP 177* 295* 485* 266* 196*   Lipid Profile: No results for input(s): CHOL, HDL, LDLCALC, TRIG, CHOLHDL, LDLDIRECT in the last 72 hours. Thyroid Function Tests: No results for input(s): TSH, T4TOTAL, FREET4, T3FREE, THYROIDAB in the last 72 hours. Anemia Panel: No results for input(s): VITAMINB12, FOLATE, FERRITIN, TIBC, IRON, RETICCTPCT in the last 72 hours. Sepsis Labs: Recent Labs  Lab 05/25/20 2320  PROCALCITON 0.26  LATICACIDVEN 1.0    Recent Results (from the past 240 hour(s))  Culture, blood (routine x 2)     Status: None   Collection Time: 05/25/20 11:30 PM   Specimen: BLOOD  Result Value Ref Range Status   Specimen Description BLOOD RIGHT ANTECUBITAL  Final   Special Requests   Final    BOTTLES DRAWN AEROBIC AND ANAEROBIC Blood  Culture adequate volume   Culture   Final    NO GROWTH 6 DAYS Performed at [facility], [address] [place], [place] [address]    Report Status 05/31/2020 FINAL  Final  Culture, blood (routine x 2)     Status: None   Collection Time: 05/25/20 11:40 PM    Specimen: BLOOD RIGHT HAND  Result Value Ref Range Status   Specimen Description BLOOD RIGHT HAND  Final   Special Requests   Final    BOTTLES DRAWN AEROBIC AND ANAEROBIC Blood Culture adequate volume   Culture   Final    NO GROWTH 6 DAYS Performed at [facility], [address], [place], [place] [address]    Report Status 05/31/2020 FINAL  Final  Aerobic Culture (superficial specimen)     Status: None (Preliminary result)   Collection Time: 05/31/20  1:55 PM   Specimen: Wound  Result Value Ref Range Status   Specimen Description WOUND RIGHT CHEST  Final   Special Requests NONE  Final   Gram Stain   Final    MODERATE WBC PRESENT,BOTH PMN AND MONONUCLEAR MODERATE GRAM NEGATIVE RODS Performed at [facility], [address] [place], [place] [address]    Culture MODERATE GRAM NEGATIVE RODS  Final   Report Status PENDING  Incomplete     RN Pressure Injury Documentation: Pressure Injury 05/07/20 Coccyx Medial;Mid Stage 2 -  Partial thickness loss of dermis presenting as a shallow open injury with a red, pink wound bed without slough. (Active)  05/07/20 2100  Location: Coccyx  Location Orientation: Medial;Mid  Staging: Stage 2 -  Partial thickness loss of dermis presenting as a shallow open injury with a red, pink wound bed without slough.  Wound Description (Comments):   Present on Admission: Yes   Estimated body mass index is 34.02 kg/m as calculated from the following:   Height as of this encounter: 5\' 8"  (1.727 m).   Weight as of this encounter: 101.5 kg.  Malnutrition Type:  Nutrition Problem: Increased nutrient needs Etiology: wound healing,acute illness (recent COVID PNA)  Malnutrition Characteristics:  Signs/Symptoms: estimated needs  Nutrition Interventions:  Interventions: MVI,Ensure Enlive (each supplement provides 350kcal and 20 grams of protein)  Radiology Studies: CT CHEST W CONTRAST  Result Date: 06/01/2020 CLINICAL DATA:  Evaluate  for pneumonia. EXAM: CT CHEST WITH CONTRAST TECHNIQUE: Multidetector CT imaging of the chest was performed during intravenous contrast administration. CONTRAST:  75mL OMNIPAQUE IOHEXOL 300 MG/ML  SOLN COMPARISON:  May 22, 2020 and May 10, 2020 FINDINGS: Cardiovascular: A small intraluminal filling defect is seen involving a lower lobe branch of the right pulmonary artery. This is decreased in size when compared to the prior study. Normal heart size with moderate to marked severity coronary artery calcification. No pericardial effusion. Mediastinum/Nodes: Moderate severity pneumomediastinum is seen which is decreased in severity when compared to the prior study. There is mild to moderate severity right hilar, pretracheal and subcarinal lymphadenopathy. Thyroid gland, trachea, and esophagus demonstrate no significant findings. Lungs/Pleura: An anterior right-sided chest tube is in place with its distal tip seen along the anterior aspect of the mid right lung. This represents a new finding when compared to the prior study. The chest tube seen within the right lung base on the prior study has been removed. Marked severity right upper lobe airspace disease is again seen with stable moderate to marked severity airspace disease noted within the right middle lobe and right lower lobe. A  stable moderate sized, partially loculated right-sided pleural effusion is seen. The bilateral pneumothoraces seen on the prior study are no longer present. Upper Abdomen: A stable 2.4 cm x 2.0 cm cyst is seen within the anterior aspect of the right lobe of the liver. A stable 1.7 cm x 1.5 cm low-attenuation left adrenal mass is seen. An additional stable 1.7 cm x 0.8 cm right adrenal mass is noted. Musculoskeletal: Marked severity subcutaneous emphysema is seen throughout the anterior and lateral aspects of the chest wall, bilaterally. Degenerative changes are noted within the thoracic spine. IMPRESSION: 1. Mild amount of residual  pulmonary embolism involving a lower lobe branch of the right pulmonary artery. 2. Right-sided chest tube positioning, as described above, with a stable moderate sized partially loculated right pleural effusion. 3. Stable extensive right upper lobe, right middle lobe and right lower lobe airspace disease. 4. Interval resolution of the bilateral pneumothoraces seen on the prior study, with residual moderate severity pneumomediastinum. 5. Marked severity subcutaneous emphysema. 6. 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 7. Stable right hepatic lobe cyst. 8. Aortic atherosclerosis. Aortic Atherosclerosis (ICD10-I70.0). Electronically Signed   By: Aram Candela M.D.   On: 06/01/2020 02:09   DG CHEST PORT 1 VIEW  Result Date: 06/01/2020 CLINICAL DATA:  Acute hypoxemic respiratory failure. MRSA pneumonia. Recent pulmonary embolism. EXAM: PORTABLE CHEST 1 VIEW COMPARISON:  05/31/2020 FINDINGS: Right pleural pigtail catheter remains in place. No pneumothorax visualized. Small layering right pleural effusion is again seen. Pulmonary consolidation and volume loss is again seen in the right upper lobe, without change. Left lung remains clear. Extensive subcutaneous emphysema is seen throughout the chest wall soft tissues. IMPRESSION: No significant change in small layering right pleural effusion, and right upper lobe pulmonary consolidation and volume loss. Extensive chest wall subcutaneous emphysema. No pneumothorax visualized. Electronically Signed   By: Danae Orleans M.D.   On: 06/01/2020 09:58   DG CHEST PORT 1 VIEW  Result Date: 05/31/2020 CLINICAL DATA:  Pneumothorax. EXAM: PORTABLE CHEST 1 VIEW COMPARISON:  05/30/2020 FINDINGS: The cardiomediastinal silhouette is unchanged. A right-sided chest tube is unchanged, and there is persistent extensive subcutaneous emphysema. No pneumothorax is identified. Extensive airspace opacity in the right lung including dense  consolidation in the upper lung has not significantly changed. Mild left mid and lower lung opacity is also unchanged and likely represents atelectasis. There may be a right pleural effusion. IMPRESSION: 1. Unchanged right chest tube without evidence of pneumothorax. 2. Unchanged right lung airspace disease. Electronically Signed   By: Sebastian Ache M.D.   On: 05/31/2020 08:32   Scheduled Meds:  atorvastatin  40 mg Oral Daily   bisacodyl  10 mg Oral Daily   Chlorhexidine Gluconate Cloth  6 each Topical Daily   cholecalciferol  1,000 Units Oral Daily   clonazePAM  0.5 mg Oral BID   enoxaparin (LOVENOX) injection  40 mg Subcutaneous Q24H   fenofibrate  160 mg Oral Daily   Gerhardt's butt cream   Topical BID   guaiFENesin  600 mg Oral BID   insulin aspart  0-15 Units Subcutaneous TID WC   insulin aspart  3 Units Subcutaneous TID WC   insulin glargine  25 Units Subcutaneous QHS   lidocaine  1 patch Transdermal Q24H   multivitamin with minerals  1 tablet Oral Daily   nystatin   Topical BID   pregabalin  150 mg Oral TID   senna-docusate  1 tablet Oral QHS   sodium chloride flush  10-40 mL Intracatheter Q12H   umeclidinium bromide  1 puff Inhalation Daily   Continuous Infusions:   LOS: 25 days   Merlene Laughter, DO [facility] PAGER is on AMION  If 7PM-7AM, please contact night-coverage [email or web address]

## 2020-06-01 NOTE — Progress Notes (Addendum)
[address]       [place] [address]             [phone]      15 Days Post-Op Procedure(s) (LRB): RIGHT CHEST WALL WOUND CLOSURE REMOVAL OF WOUND VAC (Right) Subjective: No new C/O  Objective: Vital signs in last 24 hours: Temp:  [97.5 F (36.4 C)-99.2 F (37.3 C)] 99.1 F (37.3 C) (12/18 0827) Pulse Rate:  [80-96] 96 (12/18 0827) Cardiac Rhythm: Normal sinus rhythm (12/18 0700) Resp:  [17-25] 17 (12/18 0827) BP: (103-119)/(59-72) 119/62 (12/18 0827) SpO2:  [94 %-97 %] 97 % (12/18 0827) FiO2 (%):  [28 %] 28 % (12/18 0725)  Hemodynamic parameters for last 24 hours:    Intake/Output from previous day: No intake/output data recorded. Intake/Output this shift: No intake/output data recorded.  Wound: Vac in place  Lab Results: Recent Labs    05/31/20 1801 06/01/20 0238  WBC 5.4 4.8  HGB 8.9* 8.7*  HCT 27.8* 28.7*  PLT 196 189   BMET:  Recent Labs    05/31/20 1801 06/01/20 0238  NA 135 135  K 4.3 4.6  CL 98 99  CO2 28 28  GLUCOSE 329* 271*  BUN 17 19  CREATININE 0.66 0.63  CALCIUM 8.6* 8.7*    PT/INR: No results for input(s): LABPROT, INR in the last 72 hours. ABG    Component Value Date/Time   PHART 7.400 05/12/2020 1524   HCO3 36.1 (H) 05/12/2020 1524   TCO2 38 (H) 05/12/2020 1524   O2SAT 99.0 05/12/2020 1524   CBG (last 3)  Recent Labs    05/31/20 2021 06/01/20 0029 06/01/20 0824  GLUCAP 295* 485* 266*    Meds Scheduled Meds: . atorvastatin  40 mg Oral Daily  . bisacodyl  10 mg Oral Daily  . Chlorhexidine Gluconate Cloth  6 each Topical Daily  . cholecalciferol  1,000 Units Oral Daily  . clonazePAM  0.5 mg Oral BID  . enoxaparin (LOVENOX) injection  40 mg Subcutaneous Q24H  . fenofibrate  160 mg Oral Daily  . Sunny Gains's butt cream   Topical BID  . guaiFENesin  600 mg Oral BID  . insulin aspart  0-15 Units Subcutaneous TID WC  . insulin aspart  3 Units Subcutaneous TID WC  . insulin glargine  25 Units  Subcutaneous QHS  . lidocaine  1 patch Transdermal Q24H  . multivitamin with minerals  1 tablet Oral Daily  . nystatin   Topical BID  . pregabalin  150 mg Oral TID  . senna-docusate  1 tablet Oral QHS  . sodium chloride flush  10-40 mL Intracatheter Q12H  . umeclidinium bromide  1 puff Inhalation Daily   Continuous Infusions: PRN Meds:.acetaminophen, HYDROmorphone (DILAUDID) injection, influenza vac split quadrivalent PF, levalbuterol, magnesium citrate, ondansetron (ZOFRAN) IV, oxyCODONE, pneumococcal 23 valent vaccine, polyethylene glycol, sodium chloride flush, traMADol  Xrays CT CHEST W CONTRAST  Result Date: 06/01/2020 CLINICAL DATA:  Evaluate for pneumonia. EXAM: CT CHEST WITH CONTRAST TECHNIQUE: Multidetector CT imaging of the chest was performed during intravenous contrast administration. CONTRAST:  15mL OMNIPAQUE IOHEXOL 300 MG/ML  SOLN COMPARISON:  May 22, 2020 and May 10, 2020 FINDINGS: Cardiovascular: A small intraluminal filling defect is seen involving a lower lobe branch of the right pulmonary artery. This is decreased in size when compared to the prior study. Normal heart size with moderate to marked severity coronary artery calcification. No pericardial effusion. Mediastinum/Nodes: Moderate severity pneumomediastinum is seen which is  decreased in severity when compared to the prior study. There is mild to moderate severity right hilar, pretracheal and subcarinal lymphadenopathy. Thyroid gland, trachea, and esophagus demonstrate no significant findings. Lungs/Pleura: An anterior right-sided chest tube is in place with its distal tip seen along the anterior aspect of the mid right lung. This represents a new finding when compared to the prior study. The chest tube seen within the right lung base on the prior study has been removed. Marked severity right upper lobe airspace disease is again seen with stable moderate to marked severity airspace disease noted within the right  middle lobe and right lower lobe. A stable moderate sized, partially loculated right-sided pleural effusion is seen. The bilateral pneumothoraces seen on the prior study are no longer present. Upper Abdomen: A stable 2.4 cm x 2.0 cm cyst is seen within the anterior aspect of the right lobe of the liver. A stable 1.7 cm x 1.5 cm low-attenuation left adrenal mass is seen. An additional stable 1.7 cm x 0.8 cm right adrenal mass is noted. Musculoskeletal: Marked severity subcutaneous emphysema is seen throughout the anterior and lateral aspects of the chest wall, bilaterally. Degenerative changes are noted within the thoracic spine. IMPRESSION: 1. Mild amount of residual pulmonary embolism involving a lower lobe branch of the right pulmonary artery. 2. Right-sided chest tube positioning, as described above, with a stable moderate sized partially loculated right pleural effusion. 3. Stable extensive right upper lobe, right middle lobe and right lower lobe airspace disease. 4. Interval resolution of the bilateral pneumothoraces seen on the prior study, with residual moderate severity pneumomediastinum. 5. Marked severity subcutaneous emphysema. 6. 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 7. Stable right hepatic lobe cyst. 8. Aortic atherosclerosis. Aortic Atherosclerosis (ICD10-I70.0). Electronically Signed   By: Aram Candela M.D.   On: 06/01/2020 02:09   DG CHEST PORT 1 VIEW  Result Date: 06/01/2020 CLINICAL DATA:  Acute hypoxemic respiratory failure. MRSA pneumonia. Recent pulmonary embolism. EXAM: PORTABLE CHEST 1 VIEW COMPARISON:  05/31/2020 FINDINGS: Right pleural pigtail catheter remains in place. No pneumothorax visualized. Small layering right pleural effusion is again seen. Pulmonary consolidation and volume loss is again seen in the right upper lobe, without change. Left lung remains clear. Extensive subcutaneous emphysema is seen throughout the chest wall soft  tissues. IMPRESSION: No significant change in small layering right pleural effusion, and right upper lobe pulmonary consolidation and volume loss. Extensive chest wall subcutaneous emphysema. No pneumothorax visualized. Electronically Signed   By: Danae Orleans M.D.   On: 06/01/2020 09:58   DG CHEST PORT 1 VIEW  Result Date: 05/31/2020 CLINICAL DATA:  Pneumothorax. EXAM: PORTABLE CHEST 1 VIEW COMPARISON:  05/30/2020 FINDINGS: The cardiomediastinal silhouette is unchanged. A right-sided chest tube is unchanged, and there is persistent extensive subcutaneous emphysema. No pneumothorax is identified. Extensive airspace opacity in the right lung including dense consolidation in the upper lung has not significantly changed. Mild left mid and lower lung opacity is also unchanged and likely represents atelectasis. There may be a right pleural effusion. IMPRESSION: 1. Unchanged right chest tube without evidence of pneumothorax. 2. Unchanged right lung airspace disease. Electronically Signed   By: Sebastian Ache M.D.   On: 05/31/2020 08:32    Assessment/Plan: S/P Procedure(s) (LRB): RIGHT CHEST WALL WOUND CLOSURE REMOVAL OF WOUND VAC (Right)  1 Tmax 99.2, VSS 2 sats ok on 2 l [place] 3 no leukocytosis 4 CBG control is poor currently- defer to primary svc management 5 CXR no change,  no recent CT drainage, no air leak- not sure its doing anything effective currently 6 VAC in place, no surrounding erethema- prob change early in week to re-eval.     LOS: 25 days    Rowe Clack PA-C Pager [phone] 06/01/2020 No air leak currently - but with previous problems would leave for now I have seen and examined [name] and agree with the above assessment  and plan.  Delight Ovens MD Beeper [phone] Office [phone] 06/01/2020 1:11 PM

## 2020-06-02 ENCOUNTER — Inpatient Hospital Stay (HOSPITAL_COMMUNITY): Payer: Medicare Other

## 2020-06-02 LAB — CBC WITH DIFFERENTIAL/PLATELET
Abs Immature Granulocytes: 0.08 10*3/uL — ABNORMAL HIGH (ref 0.00–0.07)
Basophils Absolute: 0 10*3/uL (ref 0.0–0.1)
Basophils Relative: 1 %
Eosinophils Absolute: 0.1 10*3/uL (ref 0.0–0.5)
Eosinophils Relative: 2 %
HCT: 28.3 % — ABNORMAL LOW (ref 36.0–46.0)
Hemoglobin: 8.6 g/dL — ABNORMAL LOW (ref 12.0–15.0)
Immature Granulocytes: 1 %
Lymphocytes Relative: 21 %
Lymphs Abs: 1.2 10*3/uL (ref 0.7–4.0)
MCH: 28.2 pg (ref 26.0–34.0)
MCHC: 30.4 g/dL (ref 30.0–36.0)
MCV: 92.8 fL (ref 80.0–100.0)
Monocytes Absolute: 0.5 10*3/uL (ref 0.1–1.0)
Monocytes Relative: 9 %
Neutro Abs: 3.8 10*3/uL (ref 1.7–7.7)
Neutrophils Relative %: 66 %
Platelets: 181 10*3/uL (ref 150–400)
RBC: 3.05 MIL/uL — ABNORMAL LOW (ref 3.87–5.11)
RDW: 16.6 % — ABNORMAL HIGH (ref 11.5–15.5)
WBC: 5.8 10*3/uL (ref 4.0–10.5)
nRBC: 0 % (ref 0.0–0.2)

## 2020-06-02 LAB — COMPREHENSIVE METABOLIC PANEL
ALT: 28 U/L (ref 0–44)
AST: 20 U/L (ref 15–41)
Albumin: 1.7 g/dL — ABNORMAL LOW (ref 3.5–5.0)
Alkaline Phosphatase: 68 U/L (ref 38–126)
Anion gap: 10 (ref 5–15)
BUN: 19 mg/dL (ref 6–20)
CO2: 26 mmol/L (ref 22–32)
Calcium: 8.6 mg/dL — ABNORMAL LOW (ref 8.9–10.3)
Chloride: 98 mmol/L (ref 98–111)
Creatinine, Ser: 0.67 mg/dL (ref 0.44–1.00)
GFR, Estimated: >60 mL/min (ref >60)
Glucose, Bld: 350 mg/dL — ABNORMAL HIGH (ref 70–99)
Potassium: 4.5 mmol/L (ref 3.5–5.1)
Sodium: 134 mmol/L — ABNORMAL LOW (ref 135–145)
Total Bilirubin: 0.4 mg/dL (ref 0.3–1.2)
Total Protein: 5.2 g/dL — ABNORMAL LOW (ref 6.5–8.1)

## 2020-06-02 LAB — GLUCOSE, CAPILLARY
Glucose-Capillary: 303 mg/dL — ABNORMAL HIGH (ref 70–99)
Glucose-Capillary: 201 mg/dL — ABNORMAL HIGH (ref 70–99)
Glucose-Capillary: 197 mg/dL — ABNORMAL HIGH (ref 70–99)
Glucose-Capillary: 351 mg/dL — ABNORMAL HIGH (ref 70–99)

## 2020-06-02 LAB — MAGNESIUM: Magnesium: 1.7 mg/dL (ref 1.7–2.4)

## 2020-06-02 LAB — AEROBIC CULTURE W GRAM STAIN (SUPERFICIAL SPECIMEN)

## 2020-06-02 LAB — PHOSPHORUS: Phosphorus: 4.1 mg/dL (ref 2.5–4.6)

## 2020-06-02 MED ORDER — INSULIN ASPART 100 UNIT/ML ~~LOC~~ SOLN
6.0000 [IU] | Freq: Three times a day (TID) | SUBCUTANEOUS | Status: DC
  Administered 2020-06-02 – 2020-06-03 (×3): 6 [IU] via SUBCUTANEOUS

## 2020-06-02 MED ORDER — INSULIN GLARGINE 100 UNIT/ML ~~LOC~~ SOLN
10.0000 [IU] | Freq: Every day | SUBCUTANEOUS | Status: DC
  Administered 2020-06-02 – 2020-06-03 (×2): 10 [IU] via SUBCUTANEOUS
  Filled 2020-06-02 (×2): qty 0.1

## 2020-06-02 MED ORDER — SODIUM CHLORIDE 0.9 % IV SOLN
2.0000 g | Freq: Every day | INTRAVENOUS | Status: DC
  Administered 2020-06-02 – 2020-06-03 (×2): 2 g via INTRAVENOUS
  Filled 2020-06-02: qty 2
  Filled 2020-06-02: qty 20

## 2020-06-02 MED ORDER — INSULIN ASPART 100 UNIT/ML ~~LOC~~ SOLN
5.0000 [IU] | Freq: Once | SUBCUTANEOUS | Status: AC
  Administered 2020-06-02: 5 [IU] via SUBCUTANEOUS

## 2020-06-02 NOTE — Progress Notes (Addendum)
[address]       [place] [address]             [phone]      16 Days Post-Op Procedure(s) (LRB): RIGHT CHEST WALL WOUND CLOSURE REMOVAL OF WOUND VAC (Right) Subjective: Hoping to go home soon  Objective: Vital signs in last 24 hours: Temp:  [98.4 F (36.9 C)-98.9 F (37.2 C)] 98.4 F (36.9 C) (12/19 0804) Pulse Rate:  [88-99] 99 (12/19 0804) Cardiac Rhythm: Normal sinus rhythm (12/19 0700) Resp:  [18-20] 20 (12/19 0804) BP: (98-104)/(53-59) 98/53 (12/19 0804) SpO2:  [94 %-99 %] 97 % (12/19 0857) FiO2 (%):  [28 %-32 %] 32 % (12/19 0857) Weight:  [106.5 kg] 106.5 kg (12/19 0500)  Hemodynamic parameters for last 24 hours:    Intake/Output from previous day: 12/18 0701 - 12/19 0700 In: 1010 [P.O.:960; IV Piggyback:50] Out: 1000 [Urine:1000] Intake/Output this shift: No intake/output data recorded.  General appearance: alert, cooperative and no distress Wound: VAC in place- no cellulitis  Lab Results: Recent Labs    06/01/20 0238 06/02/20 0442  WBC 4.8 5.8  HGB 8.7* 8.6*  HCT 28.7* 28.3*  PLT 189 181   BMET:  Recent Labs    06/01/20 0238 06/02/20 0442  NA 135 134*  K 4.6 4.5  CL 99 98  CO2 28 26  GLUCOSE 271* 350*  BUN 19 19  CREATININE 0.63 0.67  CALCIUM 8.7* 8.6*    PT/INR: No results for input(s): LABPROT, INR in the last 72 hours. ABG    Component Value Date/Time   PHART 7.400 05/12/2020 1524   HCO3 36.1 (H) 05/12/2020 1524   TCO2 38 (H) 05/12/2020 1524   O2SAT 99.0 05/12/2020 1524   CBG (last 3)  Recent Labs    06/01/20 1613 06/01/20 2130 06/02/20 0800  GLUCAP 223* 241* 303*    Meds Scheduled Meds: . atorvastatin  40 mg Oral Daily  . bisacodyl  10 mg Oral Daily  . Chlorhexidine Gluconate Cloth  6 each Topical Daily  . cholecalciferol  1,000 Units Oral Daily  . clonazePAM  0.5 mg Oral BID  . enoxaparin (LOVENOX) injection  40 mg Subcutaneous Q24H  . fenofibrate  160 mg Oral Daily  . Annette [name]'s butt  cream   Topical BID  . guaiFENesin  600 mg Oral BID  . insulin aspart  0-15 Units Subcutaneous TID WC  . insulin aspart  6 Units Subcutaneous TID WC  . insulin glargine  10 Units Subcutaneous Daily  . insulin glargine  25 Units Subcutaneous QHS  . lidocaine  1 patch Transdermal Q24H  . multivitamin with minerals  1 tablet Oral Daily  . nystatin   Topical BID  . pregabalin  150 mg Oral TID  . senna-docusate  1 tablet Oral QHS  . sodium chloride flush  10-40 mL Intracatheter Q12H  . umeclidinium bromide  1 puff Inhalation Daily   Continuous Infusions: . cefTRIAXone (ROCEPHIN)  IV 2 g (06/02/20 0901)   PRN Meds:.acetaminophen, influenza vac split quadrivalent PF, levalbuterol, magnesium citrate, ondansetron (ZOFRAN) IV, oxyCODONE, pneumococcal 23 valent vaccine, polyethylene glycol, sodium chloride flush, traMADol  Xrays CT CHEST W CONTRAST  Result Date: 06/01/2020 CLINICAL DATA:  Evaluate for pneumonia. EXAM: CT CHEST WITH CONTRAST TECHNIQUE: Multidetector CT imaging of the chest was performed during intravenous contrast administration. CONTRAST:  31mL OMNIPAQUE IOHEXOL 300 MG/ML  SOLN COMPARISON:  May 22, 2020 and May 10, 2020 FINDINGS: Cardiovascular: A small intraluminal filling  defect is seen involving a lower lobe branch of the right pulmonary artery. This is decreased in size when compared to the prior study. Normal heart size with moderate to marked severity coronary artery calcification. No pericardial effusion. Mediastinum/Nodes: Moderate severity pneumomediastinum is seen which is decreased in severity when compared to the prior study. There is mild to moderate severity right hilar, pretracheal and subcarinal lymphadenopathy. Thyroid gland, trachea, and esophagus demonstrate no significant findings. Lungs/Pleura: An anterior right-sided chest tube is in place with its distal tip seen along the anterior aspect of the mid right lung. This represents a new finding when compared  to the prior study. The chest tube seen within the right lung base on the prior study has been removed. Marked severity right upper lobe airspace disease is again seen with stable moderate to marked severity airspace disease noted within the right middle lobe and right lower lobe. A stable moderate sized, partially loculated right-sided pleural effusion is seen. The bilateral pneumothoraces seen on the prior study are no longer present. Upper Abdomen: A stable 2.4 cm x 2.0 cm cyst is seen within the anterior aspect of the right lobe of the liver. A stable 1.7 cm x 1.5 cm low-attenuation left adrenal mass is seen. An additional stable 1.7 cm x 0.8 cm right adrenal mass is noted. Musculoskeletal: Marked severity subcutaneous emphysema is seen throughout the anterior and lateral aspects of the chest wall, bilaterally. Degenerative changes are noted within the thoracic spine. IMPRESSION: 1. Mild amount of residual pulmonary embolism involving a lower lobe branch of the right pulmonary artery. 2. Right-sided chest tube positioning, as described above, with a stable moderate sized partially loculated right pleural effusion. 3. Stable extensive right upper lobe, right middle lobe and right lower lobe airspace disease. 4. Interval resolution of the bilateral pneumothoraces seen on the prior study, with residual moderate severity pneumomediastinum. 5. Marked severity subcutaneous emphysema. 6. 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 7. Stable right hepatic lobe cyst. 8. Aortic atherosclerosis. Aortic Atherosclerosis (ICD10-I70.0). Electronically Signed   By: Aram Candela M.D.   On: 06/01/2020 02:09   DG CHEST PORT 1 VIEW  Result Date: 06/02/2020 CLINICAL DATA:  Acute hypoxemia.  MRI state pneumonia. EXAM: PORTABLE CHEST 1 VIEW COMPARISON:  June 01, 2020 FINDINGS: Stable right chest tube. Continued opacity and effusion on the right with poor aeration of the right lung,  stable. The cardiomediastinal silhouette is stable. No pneumothorax. Mild increased opacity in the periphery of the left lung. Air diffusely throughout the subcutaneous tissues is stable. No other acute abnormalities. IMPRESSION: 1. Continued opacity and effusion on the right with poor aeration of the right lung. Stable right chest tube. No pneumothorax. 2. Mild increased opacity in the periphery of the left lung, may represent developing infiltrate. Recommend attention on follow-up. Electronically Signed   By: Gerome Sam III M.D   On: 06/02/2020 10:37   DG CHEST PORT 1 VIEW  Result Date: 06/01/2020 CLINICAL DATA:  Acute hypoxemic respiratory failure. MRSA pneumonia. Recent pulmonary embolism. EXAM: PORTABLE CHEST 1 VIEW COMPARISON:  05/31/2020 FINDINGS: Right pleural pigtail catheter remains in place. No pneumothorax visualized. Small layering right pleural effusion is again seen. Pulmonary consolidation and volume loss is again seen in the right upper lobe, without change. Left lung remains clear. Extensive subcutaneous emphysema is seen throughout the chest wall soft tissues. IMPRESSION: No significant change in small layering right pleural effusion, and right upper lobe pulmonary consolidation and volume loss. Extensive chest wall  subcutaneous emphysema. No pneumothorax visualized. Electronically Signed   By: Danae Orleans M.D.   On: 06/01/2020 09:58    Assessment/Plan: S/P Procedure(s) (LRB): RIGHT CHEST WALL WOUND CLOSURE REMOVAL OF WOUND VAC (Right)  1 afeb, VSS 2 no CT drainage, no air leak on water seal - could consider removal as functionally doesn't appear to be doing anything 3 no leukocytosis 4 has family member who is a Therapist, art with HH experience could possibly help take care of VAC/Chest tube as a potential option     LOS: 26 days    Rowe Clack PA-C Pager [phone] 06/02/2020  I have seen and examined Annette [name] and agree with the above assessment  and  plan.  Delight Ovens MD Beeper [phone] Office [phone] 06/02/2020 1:12 PM

## 2020-06-02 NOTE — Plan of Care (Signed)

## 2020-06-02 NOTE — Progress Notes (Signed)
Contacted Hospitalist on call to notify that BG was 351. Lantus 25 units was given per order. She does not have sliding scale cover ordered. See new order.

## 2020-06-02 NOTE — Progress Notes (Signed)
[facility] PROGRESS NOTE    Annette [name]  FBP:[id number] DOB: Jan 28, 1961 DOA: 05/07/2020 PCP: Patient, No Pcp Per      Brief Narrative:  Annette [name] is a 59 y.o. F with obesity, DM, and COPD not on home O2 who presented to OSH with cough and SOB.  At OSH [facility] she was found to have MRSA pneumonia of the right upper lobe, as well as a right lower lobe subsegmental PE.  She was treated with linezolid and Lovenox and unfortunately developed a right hydropneumothorax.  A chest tube was placed and she was transferred for CT surgery consultation.     11/23: Transferred from OSH, CT chest showed moderately large right pneumothorax, subcutaneous emphysema, second chest tube placed 11/26: Complicated by hemothorax, acute blood loss anemia 11/28: Evacuation of hematoma, mechanical pleurodesis and wound VAC placement 12/3 right chest wall wound closure and removal of wound VAC 12/6: Chest tubes removed 12/7 acute worsening subcutaneous emphysema, including into the face, chest tube replaced 12/9: Second right-sided chest tube placed to persistent right pneumothorax 12/15: Large bore chest tube, and more than 2 remaining chest tubes removed 12/17: From a right lateral chest incision; this was opened by CT surgery, culture growing Proteus, second wound VAC placed        Assessment & Plan:  MRSA pneumonia Resolved  Complicated pneumothorax 1 chest tube remains, and the wound VAC. -Defer wound VAC and chest tube management to CT surgery -Start ceftriaxone for Proteus in wound vac culture  Pulmonary embolism, acute This was stopped due to hemothorax.  CT chest 2 days ago shows improvement in her nonocclusive, resolving PE. -Still recommend 3 months of anticoagulation to be resumed prior to discharge  Stage II coccyx pressure injury, present on admission  Obesity BMI 35  Diabetes Glucose is now well controlled -Continue atorvastatin,  fibrate -Continue Lantus, increased dose -Continue sliding scale corrections -Continue mealtime insulin, increased dose -Hold home glipizide   COPD No evidence of exacerbation -Continue LAMA  Acute blood loss anemia Hemoglobin stable       Disposition: Status is: Inpatient  Remains inpatient appropriate because:Inpatient level of care appropriate due to severity of illness   Dispo: The patient is from: Home              Anticipated d/c is to: SNF              Anticipated d/c date is: 1 day              Patient currently is not medically stable to d/c.              MDM: The below labs and imaging reports were reviewed and summarized above.  Medication management as above.    DVT prophylaxis: enoxaparin (LOVENOX) injection 40 mg Start: 05/26/20 1000 SCD's Start: 05/12/20 1735  Code Status: FULL Family Communication: Called to son Trey Paula, no answer, voicemailbox full    Consultants:   CT surgery           Subjective: No chest pain, dyspnea, sputum, confusion, fever.  No abdominal pain.  Objective: Vitals:   06/01/20 2000 06/02/20 0500 06/02/20 0804 06/02/20 0857  BP:   (!) 98/53   Pulse:   99   Resp: 18  20   Temp: 98.9 F (37.2 C)  98.4 F (36.9 C)   TempSrc: Oral  Oral   SpO2:   99% 97%  Weight:  106.5 kg    Height:  Intake/Output Summary (Last 24 hours) at 06/02/2020 0939 Last data filed at 06/01/2020 2032 Gross per 24 hour  Intake 770 ml  Output 1000 ml  Net -230 ml   Filed Weights   05/30/20 0600 05/31/20 0610 06/02/20 0500  Weight: 101.4 kg 101.5 kg 106.5 kg    Examination: General appearance: obese adult female, alert and in no acute distress.   HEENT: Anicteric, conjunctiva pink, lids and lashes normal. No nasal deformity, discharge, epistaxis.  Lips moist.   Skin: Warm and dry.  no jaundice.  No suspicious rashes or lesions. Cardiac: RRR, nl S1-S2, no murmurs appreciated.  Capillary refill is brisk.  JVP not  visible.  No LE edema.  Radial pulses 2+ and symmetric. Respiratory: Normal respiratory rate and rhythm.  CTAB without rales or wheezes. The right wound VAC site has no drainage or erythema around it.  No subcutaneous air. Abdomen: Abdomen soft.  no TTP. No ascites, distension, hepatosplenomegaly.   MSK: No deformities or effusions. Neuro: Awake and alert.  EOMI, moves all extremities with severe weakness. Speech fluent.    Psych: Sensorium intact and responding to questions, attention normal. Affect anxious.  Judgment and insight appear normal.    Data Reviewed: I have personally reviewed following labs and imaging studies:  CBC: Recent Labs  Lab 05/29/20 0145 05/30/20 0038 05/31/20 1801 06/01/20 0238 06/02/20 0442  WBC 6.3 8.0 5.4 4.8 5.8  NEUTROABS  --  5.7 3.8 2.9 3.8  HGB 8.6* 8.9* 8.9* 8.7* 8.6*  HCT 27.1* 26.9* 27.8* 28.7* 28.3*  MCV 92.8 91.8 90.0 92.3 92.8  PLT 187 181 196 189 181   Basic Metabolic Panel: Recent Labs  Lab 05/29/20 0145 05/30/20 0038 05/31/20 1801 06/01/20 0238 06/02/20 0442  NA 136 135 135 135 134*  K 4.3 4.8 4.3 4.6 4.5  CL 100 100 98 99 98  CO2 28 29 28 28 26   GLUCOSE 226* 215* 329* 271* 350*  BUN 20 19 17 19 19   CREATININE 0.93 0.74 0.66 0.63 0.67  CALCIUM 8.5* 8.6* 8.6* 8.7* 8.6*  MG  --  1.7 1.7 1.6* 1.7  PHOS  --  3.3 3.6 4.0 4.1   GFR: Estimated Creatinine Clearance: 95.5 mL/min (by C-G formula based on SCr of 0.67 mg/dL). Liver Function Tests: Recent Labs  Lab 05/30/20 0038 05/31/20 1801 06/01/20 0238 06/02/20 0442  AST 25 24 29 20   ALT 31 32 36 28  ALKPHOS 62 68 66 68  BILITOT 0.4 0.5 0.5 0.4  PROT 5.2* 5.6* 5.4* 5.2*  ALBUMIN 1.7* 1.7* 1.7* 1.7*   No results for input(s): LIPASE, AMYLASE in the last 168 hours. No results for input(s): AMMONIA in the last 168 hours. Coagulation Profile: No results for input(s): INR, PROTIME in the last 168 hours. Cardiac Enzymes: No results for input(s): CKTOTAL, CKMB, CKMBINDEX,  TROPONINI in the last 168 hours. BNP (last 3 results) No results for input(s): PROBNP in the last 8760 hours. HbA1C: No results for input(s): HGBA1C in the last 72 hours. CBG: Recent Labs  Lab 06/01/20 0824 06/01/20 1150 06/01/20 1613 06/01/20 2130 06/02/20 0800  GLUCAP 266* 196* 223* 241* 303*   Lipid Profile: No results for input(s): CHOL, HDL, LDLCALC, TRIG, CHOLHDL, LDLDIRECT in the last 72 hours. Thyroid Function Tests: No results for input(s): TSH, T4TOTAL, FREET4, T3FREE, THYROIDAB in the last 72 hours. Anemia Panel: No results for input(s): VITAMINB12, FOLATE, FERRITIN, TIBC, IRON, RETICCTPCT in the last 72 hours. Urine analysis: No results found for: COLORURINE, APPEARANCEUR, LABSPEC,  PHURINE, GLUCOSEU, HGBUR, BILIRUBINUR, KETONESUR, PROTEINUR, UROBILINOGEN, NITRITE, LEUKOCYTESUR Sepsis Labs: @LABRCNTIP (procalcitonin:4,lacticacidven:4)  ) Recent Results (from the past 240 hour(s))  Culture, blood (routine x 2)     Status: None   Collection Time: 05/25/20 11:30 PM   Specimen: BLOOD  Result Value Ref Range Status   Specimen Description BLOOD RIGHT ANTECUBITAL  Final   Special Requests   Final    BOTTLES DRAWN AEROBIC AND ANAEROBIC Blood Culture adequate volume   Culture   Final    NO GROWTH 6 DAYS Performed at [facility], [address] [place], [place]    Report Status 05/31/2020 FINAL  Final  Culture, blood (routine x 2)     Status: None   Collection Time: 05/25/20 11:40 PM   Specimen: BLOOD RIGHT HAND  Result Value Ref Range Status   Specimen Description BLOOD RIGHT HAND  Final   Special Requests   Final    BOTTLES DRAWN AEROBIC AND ANAEROBIC Blood Culture adequate volume   Culture   Final    NO GROWTH 6 DAYS Performed at [facility], [address], [place], [place]    Report Status 05/31/2020 FINAL  Final  Aerobic Culture (superficial specimen)     Status: None (Preliminary result)   Collection Time: 05/31/20  1:55  PM   Specimen: Wound  Result Value Ref Range Status   Specimen Description WOUND RIGHT CHEST  Final   Special Requests NONE  Final   Gram Stain   Final    MODERATE WBC PRESENT,BOTH PMN AND MONONUCLEAR MODERATE GRAM NEGATIVE RODS Performed at [facility], [address] [place], [place]    Culture MODERATE PROTEUS MIRABILIS  Final   Report Status PENDING  Incomplete         Radiology Studies: CT CHEST W CONTRAST  Result Date: 06/01/2020 CLINICAL DATA:  Evaluate for pneumonia. EXAM: CT CHEST WITH CONTRAST TECHNIQUE: Multidetector CT imaging of the chest was performed during intravenous contrast administration. CONTRAST:  39mL OMNIPAQUE IOHEXOL 300 MG/ML  SOLN COMPARISON:  May 22, 2020 and May 10, 2020 FINDINGS: Cardiovascular: A small intraluminal filling defect is seen involving a lower lobe branch of the right pulmonary artery. This is decreased in size when compared to the prior study. Normal heart size with moderate to marked severity coronary artery calcification. No pericardial effusion. Mediastinum/Nodes: Moderate severity pneumomediastinum is seen which is decreased in severity when compared to the prior study. There is mild to moderate severity right hilar, pretracheal and subcarinal lymphadenopathy. Thyroid gland, trachea, and esophagus demonstrate no significant findings. Lungs/Pleura: An anterior right-sided chest tube is in place with its distal tip seen along the anterior aspect of the mid right lung. This represents a new finding when compared to the prior study. The chest tube seen within the right lung base on the prior study has been removed. Marked severity right upper lobe airspace disease is again seen with stable moderate to marked severity airspace disease noted within the right middle lobe and right lower lobe. A stable moderate sized, partially loculated right-sided pleural effusion is seen. The bilateral pneumothoraces seen on the prior study  are no longer present. Upper Abdomen: A stable 2.4 cm x 2.0 cm cyst is seen within the anterior aspect of the right lobe of the liver. A stable 1.7 cm x 1.5 cm low-attenuation left adrenal mass is seen. An additional stable 1.7 cm x 0.8 cm right adrenal mass is noted. Musculoskeletal: Marked severity  subcutaneous emphysema is seen throughout the anterior and lateral aspects of the chest wall, bilaterally. Degenerative changes are noted within the thoracic spine. IMPRESSION: 1. Mild amount of residual pulmonary embolism involving a lower lobe branch of the right pulmonary artery. 2. Right-sided chest tube positioning, as described above, with a stable moderate sized partially loculated right pleural effusion. 3. Stable extensive right upper lobe, right middle lobe and right lower lobe airspace disease. 4. Interval resolution of the bilateral pneumothoraces seen on the prior study, with residual moderate severity pneumomediastinum. 5. Marked severity subcutaneous emphysema. 6. 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 7. Stable right hepatic lobe cyst. 8. Aortic atherosclerosis. Aortic Atherosclerosis (ICD10-I70.0). Electronically Signed   By: Aram Candelahaddeus  Houston M.D.   On: 06/01/2020 02:09   DG CHEST PORT 1 VIEW  Result Date: 06/01/2020 CLINICAL DATA:  Acute hypoxemic respiratory failure. MRSA pneumonia. Recent pulmonary embolism. EXAM: PORTABLE CHEST 1 VIEW COMPARISON:  05/31/2020 FINDINGS: Right pleural pigtail catheter remains in place. No pneumothorax visualized. Small layering right pleural effusion is again seen. Pulmonary consolidation and volume loss is again seen in the right upper lobe, without change. Left lung remains clear. Extensive subcutaneous emphysema is seen throughout the chest wall soft tissues. IMPRESSION: No significant change in small layering right pleural effusion, and right upper lobe pulmonary consolidation and volume loss. Extensive chest wall  subcutaneous emphysema. No pneumothorax visualized. Electronically Signed   By: Danae OrleansJohn A Stahl M.D.   On: 06/01/2020 09:58        Scheduled Meds: . atorvastatin  40 mg Oral Daily  . bisacodyl  10 mg Oral Daily  . Chlorhexidine Gluconate Cloth  6 each Topical Daily  . cholecalciferol  1,000 Units Oral Daily  . clonazePAM  0.5 mg Oral BID  . enoxaparin (LOVENOX) injection  40 mg Subcutaneous Q24H  . fenofibrate  160 mg Oral Daily  . Gerhardt's butt cream   Topical BID  . guaiFENesin  600 mg Oral BID  . insulin aspart  0-15 Units Subcutaneous TID WC  . insulin aspart  6 Units Subcutaneous TID WC  . insulin glargine  10 Units Subcutaneous Daily  . insulin glargine  25 Units Subcutaneous QHS  . lidocaine  1 patch Transdermal Q24H  . multivitamin with minerals  1 tablet Oral Daily  . nystatin   Topical BID  . pregabalin  150 mg Oral TID  . senna-docusate  1 tablet Oral QHS  . sodium chloride flush  10-40 mL Intracatheter Q12H  . umeclidinium bromide  1 puff Inhalation Daily   Continuous Infusions: . cefTRIAXone (ROCEPHIN)  IV 2 g (06/02/20 0901)     LOS: 26 days    Time spent: 25 miutes    Alberteen Samhristopher P Daaiel Starlin, MD [facility] 06/02/2020, 9:39 AM     Please page though AMION or Epic secure chat:  For [employer] password, Higher education careers advisercontact charge nurse

## 2020-06-03 ENCOUNTER — Inpatient Hospital Stay (HOSPITAL_COMMUNITY): Payer: Medicare Other

## 2020-06-03 LAB — GLUCOSE, CAPILLARY
Glucose-Capillary: 169 mg/dL — ABNORMAL HIGH (ref 70–99)
Glucose-Capillary: 257 mg/dL — ABNORMAL HIGH (ref 70–99)
Glucose-Capillary: 225 mg/dL — ABNORMAL HIGH (ref 70–99)
Glucose-Capillary: 209 mg/dL — ABNORMAL HIGH (ref 70–99)

## 2020-06-03 MED ORDER — INSULIN ASPART 100 UNIT/ML ~~LOC~~ SOLN
0.0000 [IU] | Freq: Every day | SUBCUTANEOUS | Status: DC
  Administered 2020-06-03: 2 [IU] via SUBCUTANEOUS

## 2020-06-03 MED ORDER — INSULIN ASPART 100 UNIT/ML ~~LOC~~ SOLN
0.0000 [IU] | Freq: Three times a day (TID) | SUBCUTANEOUS | Status: DC
  Administered 2020-06-03: 7 [IU] via SUBCUTANEOUS
  Administered 2020-06-04 (×2): 4 [IU] via SUBCUTANEOUS
  Administered 2020-06-04: 7 [IU] via SUBCUTANEOUS
  Administered 2020-06-05: 4 [IU] via SUBCUTANEOUS
  Administered 2020-06-06 – 2020-06-07 (×2): 3 [IU] via SUBCUTANEOUS
  Administered 2020-06-07 – 2020-06-08 (×2): 4 [IU] via SUBCUTANEOUS
  Administered 2020-06-08: 3 [IU] via SUBCUTANEOUS
  Administered 2020-06-09: 7 [IU] via SUBCUTANEOUS
  Administered 2020-06-09: 4 [IU] via SUBCUTANEOUS
  Administered 2020-06-09: 3 [IU] via SUBCUTANEOUS

## 2020-06-03 MED ORDER — INSULIN GLARGINE 100 UNIT/ML ~~LOC~~ SOLN
20.0000 [IU] | Freq: Every day | SUBCUTANEOUS | Status: DC
  Administered 2020-06-04 – 2020-06-10 (×7): 20 [IU] via SUBCUTANEOUS
  Filled 2020-06-03 (×7): qty 0.2

## 2020-06-03 MED ORDER — CEPHALEXIN 500 MG PO CAPS
500.0000 mg | ORAL_CAPSULE | Freq: Three times a day (TID) | ORAL | Status: AC
  Administered 2020-06-04 – 2020-06-09 (×16): 500 mg via ORAL
  Filled 2020-06-03 (×16): qty 1

## 2020-06-03 MED ORDER — INSULIN ASPART 100 UNIT/ML ~~LOC~~ SOLN
8.0000 [IU] | Freq: Three times a day (TID) | SUBCUTANEOUS | Status: DC
  Administered 2020-06-03 – 2020-06-09 (×15): 8 [IU] via SUBCUTANEOUS

## 2020-06-03 NOTE — Progress Notes (Signed)
[facility] PROGRESS NOTE    Annette ModenaKathy [name]  ZOX:[id number] DOB: [birth year] DOA: 05/07/2020 PCP: Patient, No Pcp Per      Brief Narrative:  Annette [name] is a 59 y.o. F with obesity, DM, and COPD not on home O2 who presented to OSH with cough and SOB.  At OSH [facility], she was found to have MRSA pneumonia of the right upper lobe, as well as a right lower lobe subsegmental PE.  She was treated with linezolid and Lovenox and unfortunately developed a right hydropneumothorax.  A chest tube was placed and she was transferred for CT surgery consultation.     11/23: Transferred from OSH, CT chest showed moderately large right pneumothorax, subcutaneous emphysema, second chest tube placed 11/26: Complicated by hemothorax, acute blood loss anemia 11/28: To OR for evacuation of hematoma, mechanical pleurodesis and wound VAC placement 12/3 Right chest wall wound closure and removal of wound VAC 12/6: Chest tubes removed 12/7 Acute worsening subcutaneous emphysema, including into the face, chest tube replaced 12/9: Second right-sided chest tube placed to persistent right pneumothorax 12/15: Large bore chest tube removed, one CT left in place 12/17: Drainage from a right lateral chest incision; this was opened by CT surgery, culture growing Proteus, second wound VAC placed        Assessment & Plan:  MRSA pneumonia Resolved  Complicated pneumothorax Superficial wound infection due to Proteus mirabilis 1 chest tube remains, and the wound VAC.  The current (2nd) wound vac was placed due to drainage from an incision.  Culture of this drainage growing pan-sensitive Proteus.   -Defer wound VAC and chest tube management to CT surgery -Narrow CTX to cephalexin for 5 more days    Pulmonary embolism, acute This was diagnosed prior to transfer here.  Anticoagulation was stopped here due to hemothorax.  CT chest 3 days ago showed improvement in her  nonocclusive, resolving PE. -Still recommend 3 months of anticoagulation to be resumed when all procedures by CT surgery are complete, no further surgery anticipated  Stage II coccyx pressure injury, present on admission  Obesity BMI 35  Diabetes Glucoses elevated despite increased doses -Continue atorvastatin, fibrate -Continue Lantus, increased dose again -Continue sliding scale corrections, increased scale -Continue mealtime insulin -Hold home glipizide   COPD No evidence of exacerbation -Continue LAMA  Acute blood loss anemia Hemoglobin stable       Disposition: Status is: Inpatient  Remains inpatient appropriate because:Inpatient level of care appropriate due to severity of illness   Dispo: The patient is from: Home              Anticipated d/c is to: SNF              Anticipated d/c date is: 1 day              Patient currently is not medically stable to d/c.              MDM: The below labs and imaging reports were reviewed and summarized above.  Medication management as above.    DVT prophylaxis: enoxaparin (LOVENOX) injection 40 mg Start: 05/26/20 1000 SCD's Start: 05/12/20 1735  Code Status: FULL Family Communication: Called to son Trey PaulaJeff, no answer, voicemailbox full again    Consultants:   CT surgery  IR     Subjective: No chest pain, dyspnea, sputum, fever, abdominal pain, confusion.  Desperate to leave hospital.     Objective: Vitals:   06/02/20 1215 06/02/20 1640 06/02/20 2051  06/03/20 0842  BP: (!) 116/55 104/72 103/62   Pulse: 86 88 97   Resp: 18 19 18    Temp: (!) 97.4 F (36.3 C) 98.6 F (37 C) 98.3 F (36.8 C)   TempSrc: Oral Oral Oral   SpO2: 97% 97% 95% 96%  Weight:      Height:        Intake/Output Summary (Last 24 hours) at 06/03/2020 1431 Last data filed at 06/03/2020 1021 Gross per 24 hour  Intake 770 ml  Output 1650 ml  Net -880 ml   Filed Weights   05/30/20 0600 05/31/20 0610 06/02/20 0500   Weight: 101.4 kg 101.5 kg 106.5 kg    Examination: General appearance: Obese adult female, lying in bed, no acute distress     HEENT:    Skin:  Cardiac: Regular rate and rhythm, no murmurs, no lower extremity edema.  JVP not visible due to body habitus. Respiratory: Do not appreciate wheezing.  She is unable to sit up due to pain, but the anterior lung fields had no rales, diminished lung sounds throughout. Abdomen: Abdomen soft, no tenderness palpation or ascites or distention. MSK:  Neuro: Awake and alert, extraocular movements intact, moves all extremities with severe weakness but symmetric strength, speech fluent Psych: Sensorium intact and responding to questions, attention normal, affect and judgment appear normal     Data Reviewed: I have personally reviewed following labs and imaging studies:  CBC: Recent Labs  Lab 05/29/20 0145 05/30/20 0038 05/31/20 1801 06/01/20 0238 06/02/20 0442  WBC 6.3 8.0 5.4 4.8 5.8  NEUTROABS  --  5.7 3.8 2.9 3.8  HGB 8.6* 8.9* 8.9* 8.7* 8.6*  HCT 27.1* 26.9* 27.8* 28.7* 28.3*  MCV 92.8 91.8 90.0 92.3 92.8  PLT 187 181 196 189 181   Basic Metabolic Panel: Recent Labs  Lab 05/29/20 0145 05/30/20 0038 05/31/20 1801 06/01/20 0238 06/02/20 0442  NA 136 135 135 135 134*  K 4.3 4.8 4.3 4.6 4.5  CL 100 100 98 99 98  CO2 28 29 28 28 26   GLUCOSE 226* 215* 329* 271* 350*  BUN 20 19 17 19 19   CREATININE 0.93 0.74 0.66 0.63 0.67  CALCIUM 8.5* 8.6* 8.6* 8.7* 8.6*  MG  --  1.7 1.7 1.6* 1.7  PHOS  --  3.3 3.6 4.0 4.1   GFR: Estimated Creatinine Clearance: 95.5 mL/min (by C-G formula based on SCr of 0.67 mg/dL). Liver Function Tests: Recent Labs  Lab 05/30/20 0038 05/31/20 1801 06/01/20 0238 06/02/20 0442  AST 25 24 29 20   ALT 31 32 36 28  ALKPHOS 62 68 66 68  BILITOT 0.4 0.5 0.5 0.4  PROT 5.2* 5.6* 5.4* 5.2*  ALBUMIN 1.7* 1.7* 1.7* 1.7*   No results for input(s): LIPASE, AMYLASE in the last 168 hours. No results for input(s):  AMMONIA in the last 168 hours. Coagulation Profile: No results for input(s): INR, PROTIME in the last 168 hours. Cardiac Enzymes: No results for input(s): CKTOTAL, CKMB, CKMBINDEX, TROPONINI in the last 168 hours. BNP (last 3 results) No results for input(s): PROBNP in the last 8760 hours. HbA1C: No results for input(s): HGBA1C in the last 72 hours. CBG: Recent Labs  Lab 06/02/20 1212 06/02/20 1639 06/02/20 2149 06/03/20 0729 06/03/20 1146  GLUCAP 201* 197* 351* 169* 257*   Lipid Profile: No results for input(s): CHOL, HDL, LDLCALC, TRIG, CHOLHDL, LDLDIRECT in the last 72 hours. Thyroid Function Tests: No results for input(s): TSH, T4TOTAL, FREET4, T3FREE, THYROIDAB in the last 72  hours. Anemia Panel: No results for input(s): VITAMINB12, FOLATE, FERRITIN, TIBC, IRON, RETICCTPCT in the last 72 hours. Urine analysis: No results found for: COLORURINE, APPEARANCEUR, LABSPEC, PHURINE, GLUCOSEU, HGBUR, BILIRUBINUR, KETONESUR, PROTEINUR, UROBILINOGEN, NITRITE, LEUKOCYTESUR Sepsis Labs: @LABRCNTIP (procalcitonin:4,lacticacidven:4)  ) Recent Results (from the past 240 hour(s))  Culture, blood (routine x 2)     Status: None   Collection Time: 05/25/20 11:30 PM   Specimen: BLOOD  Result Value Ref Range Status   Specimen Description BLOOD RIGHT ANTECUBITAL  Final   Special Requests   Final    BOTTLES DRAWN AEROBIC AND ANAEROBIC Blood Culture adequate volume   Culture   Final    NO GROWTH 6 DAYS Performed at [facility], [address] [place], [place]    Report Status 05/31/2020 FINAL  Final  Culture, blood (routine x 2)     Status: None   Collection Time: 05/25/20 11:40 PM   Specimen: BLOOD RIGHT HAND  Result Value Ref Range Status   Specimen Description BLOOD RIGHT HAND  Final   Special Requests   Final    BOTTLES DRAWN AEROBIC AND ANAEROBIC Blood Culture adequate volume   Culture   Final    NO GROWTH 6 DAYS Performed at [facility], [address] [place], [place]    Report Status 05/31/2020 FINAL  Final  Aerobic Culture (superficial specimen)     Status: None   Collection Time: 05/31/20  1:55 PM   Specimen: Wound  Result Value Ref Range Status   Specimen Description WOUND RIGHT CHEST  Final   Special Requests NONE  Final   Gram Stain   Final    MODERATE WBC PRESENT,BOTH PMN AND MONONUCLEAR MODERATE GRAM NEGATIVE RODS Performed at [facility], [address], [place], [place]    Culture MODERATE PROTEUS MIRABILIS  Final   Report Status 06/02/2020 FINAL  Final   Organism ID, Bacteria PROTEUS MIRABILIS  Final      Susceptibility   Proteus mirabilis - MIC*    AMPICILLIN <=2 SENSITIVE Sensitive     CEFAZOLIN <=4 SENSITIVE Sensitive     CEFEPIME <=0.12 SENSITIVE Sensitive     CEFTAZIDIME <=1 SENSITIVE Sensitive     CEFTRIAXONE <=0.25 SENSITIVE Sensitive     CIPROFLOXACIN <=0.25 SENSITIVE Sensitive     GENTAMICIN <=1 SENSITIVE Sensitive     IMIPENEM 2 SENSITIVE Sensitive     TRIMETH/SULFA <=20 SENSITIVE Sensitive     AMPICILLIN/SULBACTAM <=2 SENSITIVE Sensitive     PIP/TAZO <=4 SENSITIVE Sensitive     * MODERATE PROTEUS MIRABILIS         Radiology Studies: DG Chest 2 View  Result Date: 06/03/2020 CLINICAL DATA:  Postoperative evaluation. EXAM: CHEST - 2 VIEW COMPARISON:  06/02/2020. FINDINGS: Right chest tube in stable position. No pneumothorax. Persistent multifocal infiltrates in the right lung particularly in the right upper lung. Persistent low lung volumes with bibasilar atelectasis. Heart size stable. Extensive chest wall subcutaneous emphysema again noted. Surgical staples noted over the right lower chest. IMPRESSION: 1. Right chest tube in stable position. No pneumothorax. Extensive chest wall subcutaneous emphysema again noted. 2. Persistent multifocal infiltrates in the right lung particularly in the right upper lung. Low lung volumes with persistent bibasilar atelectasis. Electronically  Signed   By: 06/04/2020  Register   On: 06/03/2020 08:07   DG CHEST PORT 1 VIEW  Result Date: 06/02/2020 CLINICAL DATA:  Acute hypoxemia.  MRI state pneumonia. EXAM: PORTABLE CHEST 1  VIEW COMPARISON:  June 01, 2020 FINDINGS: Stable right chest tube. Continued opacity and effusion on the right with poor aeration of the right lung, stable. The cardiomediastinal silhouette is stable. No pneumothorax. Mild increased opacity in the periphery of the left lung. Air diffusely throughout the subcutaneous tissues is stable. No other acute abnormalities. IMPRESSION: 1. Continued opacity and effusion on the right with poor aeration of the right lung. Stable right chest tube. No pneumothorax. 2. Mild increased opacity in the periphery of the left lung, may represent developing infiltrate. Recommend attention on follow-up. Electronically Signed   By: Gerome Sam III M.D   On: 06/02/2020 10:37        Scheduled Meds: . atorvastatin  40 mg Oral Daily  . bisacodyl  10 mg Oral Daily  . [START ON 06/04/2020] cephALEXin  500 mg Oral Q8H  . Chlorhexidine Gluconate Cloth  6 each Topical Daily  . cholecalciferol  1,000 Units Oral Daily  . clonazePAM  0.5 mg Oral BID  . enoxaparin (LOVENOX) injection  40 mg Subcutaneous Q24H  . fenofibrate  160 mg Oral Daily  . Gerhardt's butt cream   Topical BID  . guaiFENesin  600 mg Oral BID  . insulin aspart  0-15 Units Subcutaneous TID WC  . insulin aspart  6 Units Subcutaneous TID WC  . insulin glargine  10 Units Subcutaneous Daily  . insulin glargine  25 Units Subcutaneous QHS  . lidocaine  1 patch Transdermal Q24H  . multivitamin with minerals  1 tablet Oral Daily  . nystatin   Topical BID  . pregabalin  150 mg Oral TID  . senna-docusate  1 tablet Oral QHS  . sodium chloride flush  10-40 mL Intracatheter Q12H  . umeclidinium bromide  1 puff Inhalation Daily   Continuous Infusions:    LOS: 27 days    Time spent: 25 minutes    Alberteen Sam,  MD [facility] 06/03/2020, 2:31 PM     Please page though AMION or Epic secure chat:  For [employer], Higher education careers adviser

## 2020-06-03 NOTE — Progress Notes (Signed)
[address]       [place] [address]             [phone]      17 Days Post-Op Procedure(s) (LRB): RIGHT CHEST WALL WOUND CLOSURE REMOVAL OF WOUND VAC (Right) Subjective: Says she feels like she is progressing. She does not feel short of breath. No fever.   Objective: Vital signs in last 24 hours: Temp:  [98.2 F (36.8 C)-98.3 F (36.8 C)] 98.2 F (36.8 C) (12/20 1514) Pulse Rate:  [89-97] 89 (12/20 1514) Cardiac Rhythm: Normal sinus rhythm (12/20 0700) Resp:  [18-23] 23 (12/20 1514) BP: (103)/(59-62) 103/59 (12/20 1514) SpO2:  [95 %-98 %] 98 % (12/20 1514)  Hemodynamic parameters for last 24 hours:    Intake/Output from previous day: 12/19 0701 - 12/20 0700 In: 1420 [P.O.:1320; IV Piggyback:100] Out: 1600 [Urine:1600] Intake/Output this shift: Total I/O In: 10 [I.V.:10] Out: 50 [Chest Tube:50]  General appearance: alert, cooperative and no distress Wound: VAC in place and functioning appropriately. Serosanguinous drainage n the vac cannister but none in the tubing. - no cellulitis  Lab Results: Recent Labs    06/01/20 0238 06/02/20 0442  WBC 4.8 5.8  HGB 8.7* 8.6*  HCT 28.7* 28.3*  PLT 189 181   BMET:  Recent Labs    06/01/20 0238 06/02/20 0442  NA 135 134*  K 4.6 4.5  CL 99 98  CO2 28 26  GLUCOSE 271* 350*  BUN 19 19  CREATININE 0.63 0.67  CALCIUM 8.7* 8.6*    PT/INR: No results for input(s): LABPROT, INR in the last 72 hours. ABG    Component Value Date/Time   PHART 7.400 05/12/2020 1524   HCO3 36.1 (H) 05/12/2020 1524   TCO2 38 (H) 05/12/2020 1524   O2SAT 99.0 05/12/2020 1524   CBG (last 3)  Recent Labs    06/03/20 0729 06/03/20 1146 06/03/20 1547  GLUCAP 169* 257* 225*    Meds Scheduled Meds: . atorvastatin  40 mg Oral Daily  . bisacodyl  10 mg Oral Daily  . [START ON 06/04/2020] cephALEXin  500 mg Oral Q8H  . Chlorhexidine Gluconate Cloth  6 each Topical Daily  . cholecalciferol  1,000 Units Oral  Daily  . clonazePAM  0.5 mg Oral BID  . enoxaparin (LOVENOX) injection  40 mg Subcutaneous Q24H  . fenofibrate  160 mg Oral Daily  . Gerhardt's butt cream   Topical BID  . guaiFENesin  600 mg Oral BID  . insulin aspart  0-20 Units Subcutaneous TID WC  . insulin aspart  0-5 Units Subcutaneous QHS  . insulin aspart  8 Units Subcutaneous TID WC  . [START ON 06/04/2020] insulin glargine  20 Units Subcutaneous Daily  . insulin glargine  25 Units Subcutaneous QHS  . lidocaine  1 patch Transdermal Q24H  . multivitamin with minerals  1 tablet Oral Daily  . nystatin   Topical BID  . pregabalin  150 mg Oral TID  . senna-docusate  1 tablet Oral QHS  . sodium chloride flush  10-40 mL Intracatheter Q12H  . umeclidinium bromide  1 puff Inhalation Daily   Continuous Infusions:  PRN Meds:.acetaminophen, influenza vac split quadrivalent PF, levalbuterol, magnesium citrate, ondansetron (ZOFRAN) IV, oxyCODONE, pneumococcal 23 valent vaccine, polyethylene glycol, sodium chloride flush, traMADol  Xrays DG Chest 2 View  Result Date: 06/03/2020 CLINICAL DATA:  Postoperative evaluation. EXAM: CHEST - 2 VIEW COMPARISON:  06/02/2020. FINDINGS: Right chest tube in stable position. No  pneumothorax. Persistent multifocal infiltrates in the right lung particularly in the right upper lung. Persistent low lung volumes with bibasilar atelectasis. Heart size stable. Extensive chest wall subcutaneous emphysema again noted. Surgical staples noted over the right lower chest. IMPRESSION: 1. Right chest tube in stable position. No pneumothorax. Extensive chest wall subcutaneous emphysema again noted. 2. Persistent multifocal infiltrates in the right lung particularly in the right upper lung. Low lung volumes with persistent bibasilar atelectasis. Electronically Signed   By: Maisie Fus  Register   On: 06/03/2020 08:07   DG CHEST PORT 1 VIEW  Result Date: 06/02/2020 CLINICAL DATA:  Acute hypoxemia.  MRI state pneumonia. EXAM:  PORTABLE CHEST 1 VIEW COMPARISON:  June 01, 2020 FINDINGS: Stable right chest tube. Continued opacity and effusion on the right with poor aeration of the right lung, stable. The cardiomediastinal silhouette is stable. No pneumothorax. Mild increased opacity in the periphery of the left lung. Air diffusely throughout the subcutaneous tissues is stable. No other acute abnormalities. IMPRESSION: 1. Continued opacity and effusion on the right with poor aeration of the right lung. Stable right chest tube. No pneumothorax. 2. Mild increased opacity in the periphery of the left lung, may represent developing infiltrate. Recommend attention on follow-up. Electronically Signed   By: Gerome Sam III M.D   On: 06/02/2020 10:37    Assessment/Plan: S/P Procedure(s) (LRB): RIGHT CHEST WALL WOUND CLOSURE REMOVAL OF WOUND VAC (Right)  -Open wound left chest- wound vac changed at the bedside today. The wound is ~2x2 cm at the surface and ~ 5cm deep. The tissues are clean and dry.  Cx obtained 05/31/20 grew Proteus, on oral Keflex.  Will continue vac therapy.  -Right PTX- Resolved, sub-Q air is dissipating. The CXR continues to show airspace consolidation on the right, pigtail catheter position is unchanged.  Will plan for clamp trial in the next day or two and if appropriate will consider removing the chest tube.   LOS: 27 days    Leary Roca PA-C [phone] 06/03/2020

## 2020-06-03 NOTE — Progress Notes (Addendum)
Occupational Therapy Treatment Patient Details Name: Annette [name] MRN: [id number] DOB: 1960/11/20 Today's Date: 06/03/2020    History of present illness 59 yo female admitted to [facility] on 05/03/2020 with R upper lobe MRSA pneumonia and R lower lobe subsegmental PE. Developed right-sided hydropneumothorax on 11/22 underwent chest tube insertion and on 05/06/2020 transfered to [facility] for CTS evaluation and treatment. Additional R chest tube placement 11/24.  On 11/28, patient underwent VATS/thoracotomy mechanical pleurodesis and wound VAC placement. 3rd chest tube to be placed 12/9. 12/16 one R chest tube removed.  PMH including Covid pneumonia approximately 2 months ago, recent PE on Eliquis, COPD, asthma, depression, hypertension, and diabetes.   OT comments  Pt progressing with EOB activity for 10 mins  Pt sitting EOB x10 mins for BUE HEP and light ADL task. Pt unable to donn socks. Pt modA +2 for bed mobility and for sit to stand. Pt on 3L O2 ranging from 87%O2 to 93% with reminders for pursed lip  Breathing. Pt would benefit from continued OT skilled services for ADL, mobility and energy conservation. OT following acutely.    Follow Up Recommendations  SNF    Equipment Recommendations  3 in 1 bedside commode;Hospital bed;Other (comment) (hoyer lift with pad if going home)    Recommendations for Other Services      Precautions / Restrictions Precautions Precautions: Fall Precaution Comments: 2 chest tubes R Restrictions Weight Bearing Restrictions: No       Mobility Bed Mobility Overal bed mobility: Needs Assistance Bed Mobility: Supine to Sit;Sit to Supine;Rolling Rolling: Mod assist   Supine to sit: Mod assist;+2 for physical assistance Sit to supine: Mod assist;+2 for physical assistance;+2 for safety/equipment   General bed mobility comments: Assist to avoid chest tube and for trunk elevation and BLE movement off/on bed  Transfers Overall  transfer level: Needs assistance Equipment used: Rolling walker (2 wheeled) Transfers: Sit to/from Stand Sit to Stand: Mod assist;+2 physical assistance;+2 safety/equipment;From elevated surface         General transfer comment: pt pt requires modA for power up, increased assist to attempt to get bottom off of bed; pt unable to stand upright without flexed knees and hunched over posture.requires modA for power up, increased assist to attempt to get bottom off of bed; pt unable to stand upright without flexed knees and hunched over posture.    Balance Overall balance assessment: Needs assistance Sitting-balance support: No upper extremity supported;Feet supported Sitting balance-Leahy Scale: Fair Sitting balance - Comments: performnig HEP with BUEs sitting unsupported     Standing balance-Leahy Scale: Poor Standing balance comment: Pt requriring modA+2, RW and power-up assist for sit to stand                           ADL either performed or assessed with clinical judgement   ADL Overall ADL's : Needs assistance/impaired Eating/Feeding: Set up;Sitting           Lower Body Bathing: Moderate assistance;Sitting/lateral leans Lower Body Bathing Details (indicate cue type and reason): modA to apply washcloth and lotion     Lower Body Dressing: Total assistance;Bed level Lower Body Dressing Details (indicate cue type and reason): unable to donn socks             Functional mobility during ADLs: Moderate assistance;+2 for physical assistance General ADL Comments: Pt sitting EOB x10 mins for BUE HEP and light ADL task. Pt unable to donn socks. Pt modA +2  for bed mobility.     Vision   Vision Assessment?: No apparent visual deficits   Perception     Praxis      Cognition Arousal/Alertness: Awake/alert Behavior During Therapy: WFL for tasks assessed/performed Overall Cognitive Status: No family/caregiver present to determine baseline cognitive  functioning Area of Impairment: Safety/judgement                         Safety/Judgement: Decreased awareness of safety;Decreased awareness of deficits     General Comments: Unrealistic expectations        Exercises Exercises: Other exercises Other Exercises Other Exercises: spirometer Other Exercises: BUE shoulder punches, shoulder rolls and shoulder retraction exercises x10 reps   Shoulder Instructions       General Comments Pt on 3L O2 ranging from 87%O2 to 93% with reminders for pursed lip  breathing    Pertinent Vitals/ Pain       Pain Assessment: Faces Faces Pain Scale: Hurts even more Pain Location: Surgery sites Pain Descriptors / Indicators: Discomfort;Shooting Pain Intervention(s): Monitored during session  Home Living                                          Prior Functioning/Environment              Frequency  Min 2X/week        Progress Toward Goals  OT Goals(current goals can now be found in the care plan section)  Progress towards OT goals: Progressing toward goals  Acute Rehab OT Goals Patient Stated Goal: get home OT Goal Formulation: With patient Time For Goal Achievement: 06/12/20 Potential to Achieve Goals: Fair ADL Goals Pt Will Perform Grooming: sitting;with modified independence Pt Will Perform Upper Body Dressing: with modified independence;sitting Pt Will Perform Lower Body Dressing: with min assist;with adaptive equipment;sit to/from stand;with caregiver independent in assisting Pt Will Transfer to Toilet: with min assist;stand pivot transfer;bedside commode Pt Will Perform Toileting - Clothing Manipulation and hygiene: with min guard assist;sitting/lateral leans;sit to/from stand Additional ADL Goal #1: Pt will independently verbalize three energy conservation techniques for ADLs  Plan Discharge plan remains appropriate    Co-evaluation    PT/OT/SLP Co-Evaluation/Treatment: Yes Reason for  Co-Treatment: Complexity of the patient's impairments (multi-system involvement);To address functional/ADL transfers   OT goals addressed during session: ADL's and self-care      AM-PAC OT "6 Clicks" Daily Activity     Outcome Measure   Help from another person eating meals?: None Help from another person taking care of personal grooming?: A Little Help from another person toileting, which includes using toliet, bedpan, or urinal?: Total Help from another person bathing (including washing, rinsing, drying)?: A Lot Help from another person to put on and taking off regular upper body clothing?: A Lot Help from another person to put on and taking off regular lower body clothing?: Total 6 Click Score: 13    End of Session Equipment Utilized During Treatment: Gait belt;Rolling walker;Oxygen  OT Visit Diagnosis: Unsteadiness on feet (R26.81);Pain   Activity Tolerance Patient limited by fatigue;Treatment limited secondary to medical complications (Comment)   Patient Left in bed;with call bell/phone within reach   Nurse Communication Mobility status        Time: 2542-7062 OT Time Calculation (min): 36 min  Charges: OT General Charges $OT Visit: 1 Visit OT Treatments $Therapeutic Exercise: 8-22 mins  Flora Lipps, OTR/L Acute Rehabilitation Services Pager: [phone] Office: [phone]'   Flora Lipps 06/03/2020, 4:36 PM

## 2020-06-03 NOTE — Progress Notes (Signed)
Referring Physician(s): Dr Tyrone SageGerhardt  Supervising Physician: Ruel FavorsShick, Trevor  Patient Status:  [facility]  Chief Complaint:  Right chest tube placed in IR 12/9  Subjective:  Hx Rt PNA-- Rt hydroptx and CT placed at [facility] Tx to [facility] PTX VATS 11/28 Post op  Sub cu emphysema Rt CT placed in IR 12/9  CXR today:  IMPRESSION: 1. Right chest tube in stable position. No pneumothorax. Extensive chest wall subcutaneous emphysema again noted. 2. Persistent multifocal infiltrates in the right lung particularly in the right upper lung. Low lung volumes with persistent bibasilar atelectasis.  CT stable 500 cc in plauervac No real change No air leak CXR looks good  TCTS following pt CT may need removed per notes   Allergies: Cymbalta [duloxetine hcl]  Medications: Prior to Admission medications   Medication Sig Start Date End Date Taking? Authorizing Provider  atorvastatin (LIPITOR) 40 MG tablet Take 40 mg by mouth daily. 03/14/20  Yes [provider]  cholecalciferol (VITAMIN D3) 25 MCG (1000 UNIT) tablet Take 1,000 Units by mouth daily.   Yes [provider]  fenofibrate 160 MG tablet Take 160 mg by mouth daily. 02/15/20  Yes [provider]  glipiZIDE (GLUCOTROL) 10 MG tablet Take 10 mg by mouth 2 (two) times daily. 12/20/19  Yes [provider]  montelukast (SINGULAIR) 10 MG tablet Take 10 mg by mouth daily. 03/14/20  Yes [provider]  omeprazole (PRILOSEC) 40 MG capsule Take 40 mg by mouth 2 (two) times daily. 02/18/20  Yes [provider]  apixaban (ELIQUIS) 5 MG TABS tablet Take 2 tablets (10mg ) twice daily for 7 days, then 1 tablet (5mg ) twice daily 05/21/20   Rhetta MuraSamtani, Jai-Gurmukh, MD  clonazePAM (KLONOPIN) 0.5 MG tablet Take 1 tablet (0.5 mg total) by mouth 2 (two) times daily. 05/21/20   Rhetta MuraSamtani, Jai-Gurmukh, MD  furosemide (LASIX) 40 MG tablet Take 1 tablet (40 mg total) by mouth daily. 05/21/20 05/21/21   Rhetta MuraSamtani, Jai-Gurmukh, MD  insulin glargine (LANTUS) 100 UNIT/ML injection Inject 0.15 mLs (15 Units total) into the skin at bedtime. 05/21/20   Rhetta MuraSamtani, Jai-Gurmukh, MD  nystatin (MYCOSTATIN/NYSTOP) powder Apply topically 2 (two) times daily. 05/21/20   Rhetta MuraSamtani, Jai-Gurmukh, MD  oxyCODONE (OXY IR/ROXICODONE) 5 MG immediate release tablet Take 1-2 tablets (5-10 mg total) by mouth every 4 (four) hours as needed for moderate pain. 05/21/20   Rhetta MuraSamtani, Jai-Gurmukh, MD  polyethylene glycol (MIRALAX / GLYCOLAX) 17 g packet Take 17 g by mouth 2 (two) times daily as needed for mild constipation. 05/21/20   Rhetta MuraSamtani, Jai-Gurmukh, MD  pregabalin (LYRICA) 150 MG capsule Take 1 capsule (150 mg total) by mouth in the morning, at noon, and at bedtime. 05/21/20   Rhetta MuraSamtani, Jai-Gurmukh, MD  senna-docusate (SENOKOT-S) 8.6-50 MG tablet Take 1 tablet by mouth at bedtime. 05/21/20   Rhetta MuraSamtani, Jai-Gurmukh, MD  umeclidinium bromide (INCRUSE ELLIPTA) 62.5 MCG/INH AEPB Inhale 1 puff into the lungs daily. 05/22/20   Rhetta MuraSamtani, Jai-Gurmukh, MD     Vital Signs: BP 103/62 (BP Location: Right Arm)   Pulse 97   Temp 98.3 F (36.8 C) (Oral)   Resp 18   Ht 5\' 8"  (1.727 m)   Wt 234 lb 12.6 oz (106.5 kg)   SpO2 96%   BMI 35.70 kg/m   Physical Exam Vitals reviewed.  Skin:    General: Skin is warm.     Comments: Site of Rt CT placed in IR 12/9 Site is clean and dry NT no bleeding  NO air leak OP unchanged in few days  CXR No PTX  Neurological:     Mental Status: She is alert.     Imaging: DG Chest 2 View  Result Date: 06/03/2020 CLINICAL DATA:  Postoperative evaluation. EXAM: CHEST - 2 VIEW COMPARISON:  06/02/2020. FINDINGS: Right chest tube in stable position. No pneumothorax. Persistent multifocal infiltrates in the right lung particularly in the right upper lung. Persistent low lung volumes with bibasilar atelectasis. Heart size stable. Extensive chest wall subcutaneous emphysema again noted. Surgical staples noted over  the right lower chest. IMPRESSION: 1. Right chest tube in stable position. No pneumothorax. Extensive chest wall subcutaneous emphysema again noted. 2. Persistent multifocal infiltrates in the right lung particularly in the right upper lung. Low lung volumes with persistent bibasilar atelectasis. Electronically Signed   By: Maisie Fus  Register   On: 06/03/2020 08:07   CT CHEST W CONTRAST  Result Date: 06/01/2020 CLINICAL DATA:  Evaluate for pneumonia. EXAM: CT CHEST WITH CONTRAST TECHNIQUE: Multidetector CT imaging of the chest was performed during intravenous contrast administration. CONTRAST:  44mL OMNIPAQUE IOHEXOL 300 MG/ML  SOLN COMPARISON:  May 22, 2020 and May 10, 2020 FINDINGS: Cardiovascular: A small intraluminal filling defect is seen involving a lower lobe branch of the right pulmonary artery. This is decreased in size when compared to the prior study. Normal heart size with moderate to marked severity coronary artery calcification. No pericardial effusion. Mediastinum/Nodes: Moderate severity pneumomediastinum is seen which is decreased in severity when compared to the prior study. There is mild to moderate severity right hilar, pretracheal and subcarinal lymphadenopathy. Thyroid gland, trachea, and esophagus demonstrate no significant findings. Lungs/Pleura: An anterior right-sided chest tube is in place with its distal tip seen along the anterior aspect of the mid right lung. This represents a new finding when compared to the prior study. The chest tube seen within the right lung base on the prior study has been removed. Marked severity right upper lobe airspace disease is again seen with stable moderate to marked severity airspace disease noted within the right middle lobe and right lower lobe. A stable moderate sized, partially loculated right-sided pleural effusion is seen. The bilateral pneumothoraces seen on the prior study are no longer present. Upper Abdomen: A stable 2.4 cm x 2.0 cm  cyst is seen within the anterior aspect of the right lobe of the liver. A stable 1.7 cm x 1.5 cm low-attenuation left adrenal mass is seen. An additional stable 1.7 cm x 0.8 cm right adrenal mass is noted. Musculoskeletal: Marked severity subcutaneous emphysema is seen throughout the anterior and lateral aspects of the chest wall, bilaterally. Degenerative changes are noted within the thoracic spine. IMPRESSION: 1. Mild amount of residual pulmonary embolism involving a lower lobe branch of the right pulmonary artery. 2. Right-sided chest tube positioning, as described above, with a stable moderate sized partially loculated right pleural effusion. 3. Stable extensive right upper lobe, right middle lobe and right lower lobe airspace disease. 4. Interval resolution of the bilateral pneumothoraces seen on the prior study, with residual moderate severity pneumomediastinum. 5. Marked severity subcutaneous emphysema. 6. 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 7. Stable right hepatic lobe cyst. 8. Aortic atherosclerosis. Aortic Atherosclerosis (ICD10-I70.0). Electronically Signed   By: Aram Candela M.D.   On: 06/01/2020 02:09   DG CHEST PORT 1 VIEW  Result Date: 06/02/2020 CLINICAL DATA:  Acute hypoxemia.  MRI state pneumonia. EXAM: PORTABLE CHEST 1 VIEW COMPARISON:  June 01, 2020 FINDINGS: Stable  right chest tube. Continued opacity and effusion on the right with poor aeration of the right lung, stable. The cardiomediastinal silhouette is stable. No pneumothorax. Mild increased opacity in the periphery of the left lung. Air diffusely throughout the subcutaneous tissues is stable. No other acute abnormalities. IMPRESSION: 1. Continued opacity and effusion on the right with poor aeration of the right lung. Stable right chest tube. No pneumothorax. 2. Mild increased opacity in the periphery of the left lung, may represent developing infiltrate. Recommend attention on  follow-up. Electronically Signed   By: Gerome Sam III M.D   On: 06/02/2020 10:37   DG CHEST PORT 1 VIEW  Result Date: 06/01/2020 CLINICAL DATA:  Acute hypoxemic respiratory failure. MRSA pneumonia. Recent pulmonary embolism. EXAM: PORTABLE CHEST 1 VIEW COMPARISON:  05/31/2020 FINDINGS: Right pleural pigtail catheter remains in place. No pneumothorax visualized. Small layering right pleural effusion is again seen. Pulmonary consolidation and volume loss is again seen in the right upper lobe, without change. Left lung remains clear. Extensive subcutaneous emphysema is seen throughout the chest wall soft tissues. IMPRESSION: No significant change in small layering right pleural effusion, and right upper lobe pulmonary consolidation and volume loss. Extensive chest wall subcutaneous emphysema. No pneumothorax visualized. Electronically Signed   By: Danae Orleans M.D.   On: 06/01/2020 09:58   DG CHEST PORT 1 VIEW  Result Date: 05/31/2020 CLINICAL DATA:  Pneumothorax. EXAM: PORTABLE CHEST 1 VIEW COMPARISON:  05/30/2020 FINDINGS: The cardiomediastinal silhouette is unchanged. A right-sided chest tube is unchanged, and there is persistent extensive subcutaneous emphysema. No pneumothorax is identified. Extensive airspace opacity in the right lung including dense consolidation in the upper lung has not significantly changed. Mild left mid and lower lung opacity is also unchanged and likely represents atelectasis. There may be a right pleural effusion. IMPRESSION: 1. Unchanged right chest tube without evidence of pneumothorax. 2. Unchanged right lung airspace disease. Electronically Signed   By: Sebastian Ache M.D.   On: 05/31/2020 08:32    Labs:  CBC: Recent Labs    05/30/20 0038 05/31/20 1801 06/01/20 0238 06/02/20 0442  WBC 8.0 5.4 4.8 5.8  HGB 8.9* 8.9* 8.7* 8.6*  HCT 26.9* 27.8* 28.7* 28.3*  PLT 181 196 189 181    COAGS: Recent Labs    05/11/20 1135  INR 1.0  APTT 33    BMP: Recent  Labs    05/30/20 0038 05/31/20 1801 06/01/20 0238 06/02/20 0442  NA 135 135 135 134*  K 4.8 4.3 4.6 4.5  CL 100 98 99 98  CO2 29 28 28 26   GLUCOSE 215* 329* 271* 350*  BUN 19 17 19 19   CALCIUM 8.6* 8.6* 8.7* 8.6*  CREATININE 0.74 0.66 0.63 0.67  GFRNONAA >60 >60 >60 >60    LIVER FUNCTION TESTS: Recent Labs    05/30/20 0038 05/31/20 1801 06/01/20 0238 06/02/20 0442  BILITOT 0.4 0.5 0.5 0.4  AST 25 24 29 20   ALT 31 32 36 28  ALKPHOS 62 68 66 68  PROT 5.2* 5.6* 5.4* 5.2*  ALBUMIN 1.7* 1.7* 1.7* 1.7*    Assessment and Plan:  Rt CT intact OP no change in few days No air leak CXR - no PTX Plan per TCTS---- if need IR to remove- please place order.   Electronically Signed: 06/03/20, PA-C 06/03/2020, 1:52 PM   I spent a total of 15 Minutes at the the patient's bedside AND on the patient's hospital floor or unit, greater than 50% of which was  counseling/coordinating care for right chest tube drain

## 2020-06-03 NOTE — Progress Notes (Signed)
Physical Therapy Treatment Patient Details Name: Annette [name] MRN: [id number] DOB: April 07, 1961 Today's Date: 06/03/2020    History of Present Illness 59 yo female admitted to [facility] on 05/03/2020 with R upper lobe MRSA pneumonia and R lower lobe subsegmental PE. Developed right-sided hydropneumothorax on 11/22 underwent chest tube insertion and on 05/06/2020 transfered to [facility] for CTS evaluation and treatment. Additional R chest tube placement 11/24.  On 11/28, patient underwent VATS/thoracotomy mechanical pleurodesis and wound VAC placement. 3rd chest tube to be placed 12/9. 12/16 one R chest tube removed.  PMH including Covid pneumonia approximately 2 months ago, recent PE on Eliquis, COPD, asthma, depression, hypertension, and diabetes.    PT Comments    PT and OT saw pt together to progress pt mobility and maximize therapeutic impact. pt tearful stating she feels "hopeless" and wants to give up, requires encouragement focused on end goal of getting home to maximally participate. Pt requiring heavy mod +2 for bed mobility and transfer tasks, pt limited in tolerance due to fatigue. PT instructed pt in LE exercises to promote strengthening, ROM maintenance, and edema control. PT to continue to follow acutely, if pt continues to refuse SNF PT recommending HHPT and 24/7 assist from family. May need hoyer lift as well pending pt progress.    Follow Up Recommendations  SNF     Equipment Recommendations  3in1 (PT);Hospital bed;Other (comment)    Recommendations for Other Services       Precautions / Restrictions Precautions Precautions: Fall Precaution Comments: 1 chest tube to R anterior thorax, 1 wound vac to R side Restrictions Weight Bearing Restrictions: No    Mobility  Bed Mobility Overal bed mobility: Needs Assistance Bed Mobility: Supine to Sit;Sit to Supine;Rolling Rolling: Mod assist   Supine to sit: Mod assist;+2 for physical assistance Sit to  supine: Mod assist;+2 for physical assistance;+2 for safety/equipment   General bed mobility comments: Assist for lines/leads, as well as for trunk elevation and BLE movement off/on bed  Transfers Overall transfer level: Needs assistance Equipment used: Rolling walker (2 wheeled) Transfers: Sit to/from Stand Sit to Stand: Mod assist;+2 physical assistance;+2 safety/equipment;From elevated surface         General transfer comment: pt pt requires modA for power up, increased assist to attempt to get bottom off of bed; pt unable to stand upright without flexed knees and hunched over posture.requires modA for power up, increased assist to attempt to get bottom off of bed; pt unable to stand upright without flexed knees and hunched over posture.  Ambulation/Gait             General Gait Details: attempts to step towards [facility], but unable to progress LEs   Stairs             Wheelchair Mobility    Modified Rankin (Stroke Patients Only)       Balance Overall balance assessment: Needs assistance Sitting-balance support: No upper extremity supported;Feet supported Sitting balance-Leahy Scale: Fair Sitting balance - Comments: able to sit EOB without PT/OT support     Standing balance-Leahy Scale: Poor Standing balance comment: Pt requriring modA+2, RW and power-up assist for sit to stand                            Cognition Arousal/Alertness: Awake/alert Behavior During Therapy: WFL for tasks assessed/performed Overall Cognitive Status: No family/caregiver present to determine baseline cognitive functioning Area of Impairment: Safety/judgement  Safety/Judgement: Decreased awareness of safety;Decreased awareness of deficits     General Comments: pt tearful stating she feels "hopeless" and wants to give up, requires encouragement focused on end goal of getting home to maximally participate. Lacks insight into deficits       Exercises General Exercises - Lower Extremity Ankle Circles/Pumps: AROM;Both;20 reps;Seated Long Arc Quad: AROM;Both;Seated;15 reps Hip Flexion/Marching: AAROM;Both;10 reps;Seated Other Exercises Other Exercises: spirometer Other Exercises: BUE shoulder punches, shoulder rolls and shoulder retraction exercises x10 reps    General Comments General comments (skin integrity, edema, etc.): 3LO2 via Jet, SPO2 87-93% with cues for pursed lip breathing to recover sats      Pertinent Vitals/Pain Pain Assessment: Faces Faces Pain Scale: Hurts even more Pain Location: chest tube site Pain Descriptors / Indicators: Discomfort;Shooting Pain Intervention(s): Limited activity within patient's tolerance;Monitored during session;Repositioned    Home Living                      Prior Function            PT Goals (current goals can now be found in the care plan section) Acute Rehab PT Goals Patient Stated Goal: get home PT Goal Formulation: With patient Time For Goal Achievement: 06/10/20 Potential to Achieve Goals: Fair Progress towards PT goals: Progressing toward goals    Frequency    Min 3X/week      PT Plan Current plan remains appropriate    Co-evaluation PT/OT/SLP Co-Evaluation/Treatment: Yes Reason for Co-Treatment: For patient/therapist safety;To address functional/ADL transfers PT goals addressed during session: Mobility/safety with mobility;Balance;Proper use of DME;Strengthening/ROM OT goals addressed during session: ADL's and self-care      AM-PAC PT "6 Clicks" Mobility   Outcome Measure  Help needed turning from your back to your side while in a flat bed without using bedrails?: A Lot Help needed moving from lying on your back to sitting on the side of a flat bed without using bedrails?: A Lot Help needed moving to and from a bed to a chair (including a wheelchair)?: A Lot Help needed standing up from a chair using your arms (e.g., wheelchair or bedside  chair)?: A Lot Help needed to walk in hospital room?: Total Help needed climbing 3-5 steps with a railing? : Total 6 Click Score: 10    End of Session Equipment Utilized During Treatment: Oxygen Activity Tolerance: Patient limited by fatigue Patient left: with call bell/phone within reach;in bed;with bed alarm set Nurse Communication: Mobility status PT Visit Diagnosis: Muscle weakness (generalized) (M62.81)     Time: 1740-8144 PT Time Calculation (min) (ACUTE ONLY): 36 min  Charges:  $Therapeutic Activity: 8-22 mins                    Marye Round, PT Acute Rehabilitation Services Pager [phone]  Office [phone]    Tyrone Apple E Christain [place] 06/03/2020, 5:15 PM

## 2020-06-03 NOTE — Plan of Care (Signed)

## 2020-06-04 LAB — GLUCOSE, CAPILLARY
Glucose-Capillary: 197 mg/dL — ABNORMAL HIGH (ref 70–99)
Glucose-Capillary: 202 mg/dL — ABNORMAL HIGH (ref 70–99)
Glucose-Capillary: 160 mg/dL — ABNORMAL HIGH (ref 70–99)
Glucose-Capillary: 75 mg/dL (ref 70–99)
Glucose-Capillary: 131 mg/dL — ABNORMAL HIGH (ref 70–99)

## 2020-06-04 MED ORDER — APIXABAN 5 MG PO TABS: 5.0000 mg | ORAL_TABLET | Freq: Two times a day (BID) | ORAL | Status: DC

## 2020-06-04 MED ORDER — GLIPIZIDE 5 MG PO TABS
5.0000 mg | ORAL_TABLET | Freq: Two times a day (BID) | ORAL | Status: DC
  Administered 2020-06-04 – 2020-06-09 (×10): 5 mg via ORAL
  Filled 2020-06-04 (×10): qty 1

## 2020-06-04 MED ORDER — APIXABAN 5 MG PO TABS
10.0000 mg | ORAL_TABLET | Freq: Two times a day (BID) | ORAL | Status: DC
  Administered 2020-06-04 – 2020-06-10 (×13): 10 mg via ORAL
  Filled 2020-06-04 (×13): qty 2

## 2020-06-04 NOTE — Progress Notes (Addendum)
[address]       [place] [address]             [phone]      18 Days Post-Op Procedure(s) (LRB): RIGHT CHEST WALL WOUND CLOSURE REMOVAL OF WOUND VAC (Right) Subjective: Awake and alert, says she had a good night. No new concerns.  Remains on RA, cough is dry.   Objective: Vital signs in last 24 hours: Temp:  [98.2 F (36.8 C)-100 F (37.8 C)] 98.3 F (36.8 C) (12/21 0520) Pulse Rate:  [73-89] 73 (12/21 0520) Cardiac Rhythm: Normal sinus rhythm (12/21 0700) Resp:  [18-23] 18 (12/21 0520) BP: (103)/(59-72) 103/69 (12/21 0520) SpO2:  [96 %-98 %] 98 % (12/21 0741)  Hemodynamic parameters for last 24 hours:    Intake/Output from previous day: 12/20 0701 - 12/21 0700 In: 11.1 [I.V.:10; IV Piggyback:1.1] Out: 1120 [Urine:800; Chest Tube:320] Intake/Output this shift: Total I/O In: 10 [I.V.:10] Out: -   General appearance: alert, cooperative and no distress Wound: VAC in place and functioning appropriately. Minimal drainage since the vac was changed yesterday.  The right pigtail catheter is to water seal. No air leak for several days and minimal drainage.   Lab Results: Recent Labs    06/02/20 0442  WBC 5.8  HGB 8.6*  HCT 28.3*  PLT 181   BMET:  Recent Labs    06/02/20 0442  NA 134*  K 4.5  CL 98  CO2 26  GLUCOSE 350*  BUN 19  CREATININE 0.67  CALCIUM 8.6*    PT/INR: No results for input(s): LABPROT, INR in the last 72 hours. ABG    Component Value Date/Time   PHART 7.400 05/12/2020 1524   HCO3 36.1 (H) 05/12/2020 1524   TCO2 38 (H) 05/12/2020 1524   O2SAT 99.0 05/12/2020 1524   CBG (last 3)  Recent Labs    06/03/20 1547 06/03/20 2017 06/04/20 0730  GLUCAP 225* 209* 197*    Meds Scheduled Meds: . atorvastatin  40 mg Oral Daily  . bisacodyl  10 mg Oral Daily  . cephALEXin  500 mg Oral Q8H  . Chlorhexidine Gluconate Cloth  6 each Topical Daily  . cholecalciferol  1,000 Units Oral Daily  . clonazePAM  0.5 mg  Oral BID  . enoxaparin (LOVENOX) injection  40 mg Subcutaneous Q24H  . fenofibrate  160 mg Oral Daily  . Annette [name]'s butt cream   Topical BID  . guaiFENesin  600 mg Oral BID  . insulin aspart  0-20 Units Subcutaneous TID WC  . insulin aspart  0-5 Units Subcutaneous QHS  . insulin aspart  8 Units Subcutaneous TID WC  . insulin glargine  20 Units Subcutaneous Daily  . insulin glargine  25 Units Subcutaneous QHS  . lidocaine  1 patch Transdermal Q24H  . multivitamin with minerals  1 tablet Oral Daily  . nystatin   Topical BID  . pregabalin  150 mg Oral TID  . senna-docusate  1 tablet Oral QHS  . sodium chloride flush  10-40 mL Intracatheter Q12H  . umeclidinium bromide  1 puff Inhalation Daily   Continuous Infusions:  PRN Meds:.acetaminophen, influenza vac split quadrivalent PF, levalbuterol, magnesium citrate, ondansetron (ZOFRAN) IV, oxyCODONE, pneumococcal 23 valent vaccine, polyethylene glycol, sodium chloride flush, traMADol  Xrays DG Chest 2 View  Result Date: 06/03/2020 CLINICAL DATA:  Postoperative evaluation. EXAM: CHEST - 2 VIEW COMPARISON:  06/02/2020. FINDINGS: Right chest tube in stable position. No pneumothorax. Persistent multifocal infiltrates in  the right lung particularly in the right upper lung. Persistent low lung volumes with bibasilar atelectasis. Heart size stable. Extensive chest wall subcutaneous emphysema again noted. Surgical staples noted over the right lower chest. IMPRESSION: 1. Right chest tube in stable position. No pneumothorax. Extensive chest wall subcutaneous emphysema again noted. 2. Persistent multifocal infiltrates in the right lung particularly in the right upper lung. Low lung volumes with persistent bibasilar atelectasis. Electronically Signed   By: Maisie Fus  Register   On: 06/03/2020 08:07    Assessment/Plan: S/P Procedure(s) (LRB): RIGHT CHEST WALL WOUND CLOSURE REMOVAL OF WOUND VAC (Right)  -Open wound left chest- wound vac changed at the  bedside yesterday. The wound is ~2x2 cm at the surface and ~ 5cm deep. The tissues are clean and dry.  Cx obtained 05/31/20 grew Proteus, on oral Keflex.  Will continue vac therapy. Annette [name] has a family member who is a CNA who she believes is willing to learn how to do the vac changes at home. This may be an acceptable option since the care management team was unable to find a home health agency in the [place] area to do dressing changes. Will try to meet with the family member tomorrow during the vac change and discuss this option.   -Right PTX- Resolved, sub-Q air is dissipating. The CXR continues to show airspace consolidation on the right, pigtail catheter position is unchanged, no PTX.  Discussed with surgeons, will plan CT removal today.    LOS: 28 days    Leary Roca PA-C [phone] 06/04/2020  Patient seen - no air leak for several days sub q air much improved  Plan removal of chest tube today Daughter to come tomorrow for training in wound vac /dressing changes  - report is that where patient lives there is no home health to assist  I have seen and examined Annette [name] and agree with the above assessment  and plan.  Delight Ovens MD Beeper [phone] Office [phone] 06/04/2020 11:13 AM

## 2020-06-04 NOTE — Progress Notes (Signed)
[facility] PROGRESS NOTE    Annette [name]  WUJ:[id number] DOB: [birth year] DOA: 05/07/2020 PCP: Patient, No Pcp Per      Brief Narrative:  Annette [name] is a 59 y.o. F with obesity, DM, and COPD not on home O2 who presented to OSH with cough and SOB.  At OSH [facility], she was found to have MRSA pneumonia of the right upper lobe, as well as a right lower lobe subsegmental PE.  She was treated with linezolid and Lovenox and unfortunately developed a right hydropneumothorax.  A chest tube was placed and she was transferred for CT surgery consultation.     11/23: Transferred from OSH, CT chest showed moderately large right pneumothorax, subcutaneous emphysema, second chest tube placed 11/26: Complicated by hemothorax, acute blood loss anemia 11/28: To OR for evacuation of hematoma, mechanical pleurodesis and wound VAC placement 12/3 Right chest wall wound closure and removal of wound VAC 12/6: Chest tubes removed 12/7 Acute worsening subcutaneous emphysema, including into the face, chest tube replaced 12/9: Second right-sided chest tube placed to persistent right pneumothorax 12/15: Large bore chest tube removed, one CT left in place 12/17: Drainage from a right lateral chest incision; this was opened by CT surgery, culture growing Proteus, second wound VAC placed 12/20: Wound vac removed at bedside, reapplied 12/21: CT surgery removed last chest tube       Assessment & Plan:  MRSA pneumonia Resolved  Complicated pneumothorax Superficial wound infection due to Proteus mirabilis 1 chest tube remains, and the wound VAC.  The current (2nd) wound vac was placed due to drainage from an incision.  Culture of this drainage growing pan-sensitive Proteus.   -Defer wound VAC and chest tube management to CT surgery -Narrow CTX to cephalexin for 5 more days    Pulmonary embolism, acute This was diagnosed prior to transfer here.  Anticoagulation was  stopped here due to hemothorax.  CT chest 3 days ago showed improvement in her nonocclusive, resolving PE. -Resume anticoagulation, with apixaban    Diabetes, with hyperglycemia Glucoses difficult to control despite daily increases in insulin regimen  -Continue Lantus BID --> patient will need this at discharge -Resume glipizide -Continue atorvastatin, fibrate -Continue SS ccorrections and mealtime insulin    COPD No evidence of exacerbation -Continue LAMA  Acute blood loss anemia Hemoglobin stable  Stage II coccyx pressure injury, present on admission  Obesity BMI 35      Disposition: Status is: Inpatient  Remains inpatient appropriate because:Inpatient level of care appropriate due to severity of illness   Dispo: The patient is from: Home              Anticipated d/c is to: SNF              Anticipated d/c date is: 1 day              Patient currently is not medically stable to d/c.     Patient was admitted with complex pneumothorax.  Stay prolonged by recurrent pneumonthorax and now wound infection.  All chest tubes now out.  Paitent is hoping to go home with sister caring for her wound vac, which will need to be in place for some weeks, managed as an outpaitent by CT surgery.   It is my judgment that the patient should be discharged to SNF as soon as a bed is available.  CT surgery will meet with sister tomorrow to train her on the wound VAC, with the expectation that the  patient could go home with her tomorrow afternoon.         MDM: The below labs and imaging reports were reviewed and summarized above.  Medication management as above.    DVT prophylaxis: enoxaparin (LOVENOX) injection 40 mg Start: 05/26/20 1000 SCD's Start: 05/12/20 1735  Code Status: FULL Family Communication: Called to son Annette [name], no answer, voicemailbox full again    Consultants:   CT surgery  IR     Subjective: No complaints.  No dyspnea, sputum, cough, fever, chest  pain, abdominal pain, confusion.  Paitent refuses physical therapy mobilization.     Objective: Vitals:   06/03/20 1514 06/03/20 2121 06/04/20 0520 06/04/20 0741  BP: (!) 103/59 103/72 103/69   Pulse: 89 79 73   Resp: (!) 23  18   Temp: 98.2 F (36.8 C) 100 F (37.8 C) 98.3 F (36.8 C)   TempSrc: Oral Oral Oral   SpO2: 98% 96% 98% 98%  Weight:      Height:        Intake/Output Summary (Last 24 hours) at 06/04/2020 1315 Last data filed at 06/04/2020 4742 Gross per 24 hour  Intake 11.14 ml  Output 1070 ml  Net -1058.86 ml   Filed Weights   05/30/20 0600 05/31/20 0610 06/02/20 0500  Weight: 101.4 kg 101.5 kg 106.5 kg    Examination: General appearance: This is a female, lying in bed, no obvious distress appears listless     HEENT:   Skin: No redness or drainage around her right lateral chest wound VAC no induration or swelling in this area. Cardiac: Regular rate and rhythm, no murmurs, no lower extremity edema, JVP not visible due to body habitus Respiratory: No wheezing, although lung sounds are severely diminished in the patient is unable to sit up due to weakness. Abdomen: Abdomen soft, no tenderness palpation, ascites, or distention. MSK:  Neuro: Awake and alert, extraocular movements intact, moves all extremities with severe weakness but symmetric strength, speech Psych: Sensorium intact responding to questions, attention normal, affect blunted, judgment appears normal       Data Reviewed: I have personally reviewed following labs and imaging studies:  CBC: Recent Labs  Lab 05/29/20 0145 05/30/20 0038 05/31/20 1801 06/01/20 0238 06/02/20 0442  WBC 6.3 8.0 5.4 4.8 5.8  NEUTROABS  --  5.7 3.8 2.9 3.8  HGB 8.6* 8.9* 8.9* 8.7* 8.6*  HCT 27.1* 26.9* 27.8* 28.7* 28.3*  MCV 92.8 91.8 90.0 92.3 92.8  PLT 187 181 196 189 181   Basic Metabolic Panel: Recent Labs  Lab 05/29/20 0145 05/30/20 0038 05/31/20 1801 06/01/20 0238 06/02/20 0442  NA 136 135 135  135 134*  K 4.3 4.8 4.3 4.6 4.5  CL 100 100 98 99 98  CO2 28 29 28 28 26   GLUCOSE 226* 215* 329* 271* 350*  BUN 20 19 17 19 19   CREATININE 0.93 0.74 0.66 0.63 0.67  CALCIUM 8.5* 8.6* 8.6* 8.7* 8.6*  MG  --  1.7 1.7 1.6* 1.7  PHOS  --  3.3 3.6 4.0 4.1   GFR: Estimated Creatinine Clearance: 95.5 mL/min (by C-G formula based on SCr of 0.67 mg/dL). Liver Function Tests: Recent Labs  Lab 05/30/20 0038 05/31/20 1801 06/01/20 0238 06/02/20 0442  AST 25 24 29 20   ALT 31 32 36 28  ALKPHOS 62 68 66 68  BILITOT 0.4 0.5 0.5 0.4  PROT 5.2* 5.6* 5.4* 5.2*  ALBUMIN 1.7* 1.7* 1.7* 1.7*   No results for input(s): LIPASE, AMYLASE in the  last 168 hours. No results for input(s): AMMONIA in the last 168 hours. Coagulation Profile: No results for input(s): INR, PROTIME in the last 168 hours. Cardiac Enzymes: No results for input(s): CKTOTAL, CKMB, CKMBINDEX, TROPONINI in the last 168 hours. BNP (last 3 results) No results for input(s): PROBNP in the last 8760 hours. HbA1C: No results for input(s): HGBA1C in the last 72 hours. CBG: Recent Labs  Lab 06/03/20 1146 06/03/20 1547 06/03/20 2017 06/04/20 0730 06/04/20 1135  GLUCAP 257* 225* 209* 197* 202*   Lipid Profile: No results for input(s): CHOL, HDL, LDLCALC, TRIG, CHOLHDL, LDLDIRECT in the last 72 hours. Thyroid Function Tests: No results for input(s): TSH, T4TOTAL, FREET4, T3FREE, THYROIDAB in the last 72 hours. Anemia Panel: No results for input(s): VITAMINB12, FOLATE, FERRITIN, TIBC, IRON, RETICCTPCT in the last 72 hours. Urine analysis: No results found for: COLORURINE, APPEARANCEUR, LABSPEC, PHURINE, GLUCOSEU, HGBUR, BILIRUBINUR, KETONESUR, PROTEINUR, UROBILINOGEN, NITRITE, LEUKOCYTESUR Sepsis Labs: @LABRCNTIP (procalcitonin:4,lacticacidven:4)  ) Recent Results (from the past 240 hour(s))  Culture, blood (routine x 2)     Status: None   Collection Time: 05/25/20 11:30 PM   Specimen: BLOOD  Result Value Ref Range Status    Specimen Description BLOOD RIGHT ANTECUBITAL  Final   Special Requests   Final    BOTTLES DRAWN AEROBIC AND ANAEROBIC Blood Culture adequate volume   Culture   Final    NO GROWTH 6 DAYS Performed at [facility], [address], [place], [place]    Report Status 05/31/2020 FINAL  Final  Culture, blood (routine x 2)     Status: None   Collection Time: 05/25/20 11:40 PM   Specimen: BLOOD RIGHT HAND  Result Value Ref Range Status   Specimen Description BLOOD RIGHT HAND  Final   Special Requests   Final    BOTTLES DRAWN AEROBIC AND ANAEROBIC Blood Culture adequate volume   Culture   Final    NO GROWTH 6 DAYS Performed at [facility], [address], [place], [place]    Report Status 05/31/2020 FINAL  Final  Aerobic Culture (superficial specimen)     Status: None   Collection Time: 05/31/20  1:55 PM   Specimen: Wound  Result Value Ref Range Status   Specimen Description WOUND RIGHT CHEST  Final   Special Requests NONE  Final   Gram Stain   Final    MODERATE WBC PRESENT,BOTH PMN AND MONONUCLEAR MODERATE GRAM NEGATIVE RODS Performed at [facility], [address], [place], [place]    Culture MODERATE PROTEUS MIRABILIS  Final   Report Status 06/02/2020 FINAL  Final   Organism ID, Bacteria PROTEUS MIRABILIS  Final      Susceptibility   Proteus mirabilis - MIC*    AMPICILLIN <=2 SENSITIVE Sensitive     CEFAZOLIN <=4 SENSITIVE Sensitive     CEFEPIME <=0.12 SENSITIVE Sensitive     CEFTAZIDIME <=1 SENSITIVE Sensitive     CEFTRIAXONE <=0.25 SENSITIVE Sensitive     CIPROFLOXACIN <=0.25 SENSITIVE Sensitive     GENTAMICIN <=1 SENSITIVE Sensitive     IMIPENEM 2 SENSITIVE Sensitive     TRIMETH/SULFA <=20 SENSITIVE Sensitive     AMPICILLIN/SULBACTAM <=2 SENSITIVE Sensitive     PIP/TAZO <=4 SENSITIVE Sensitive     * MODERATE PROTEUS MIRABILIS         Radiology Studies: DG Chest 2 View  Result Date: 06/03/2020 CLINICAL DATA:   Postoperative evaluation. EXAM: CHEST - 2 VIEW COMPARISON:  06/02/2020. FINDINGS: Right chest tube in stable position. No pneumothorax. Persistent multifocal infiltrates in the right lung particularly in the right upper lung. Persistent low lung volumes with bibasilar atelectasis. Heart size stable. Extensive chest wall subcutaneous emphysema again noted. Surgical staples noted over the right lower chest. IMPRESSION: 1. Right chest tube in stable position. No pneumothorax. Extensive chest wall subcutaneous emphysema again noted. 2. Persistent multifocal infiltrates in the right lung particularly in the right upper lung. Low lung volumes with persistent bibasilar atelectasis. Electronically Signed   By: Maisie Fus  Register   On: 06/03/2020 08:07        Scheduled Meds: . atorvastatin  40 mg Oral Daily  . bisacodyl  10 mg Oral Daily  . cephALEXin  500 mg Oral Q8H  . Chlorhexidine Gluconate Cloth  6 each Topical Daily  . cholecalciferol  1,000 Units Oral Daily  . clonazePAM  0.5 mg Oral BID  . enoxaparin (LOVENOX) injection  40 mg Subcutaneous Q24H  . fenofibrate  160 mg Oral Daily  . Gerhardt's butt cream   Topical BID  . guaiFENesin  600 mg Oral BID  . insulin aspart  0-20 Units Subcutaneous TID WC  . insulin aspart  0-5 Units Subcutaneous QHS  . insulin aspart  8 Units Subcutaneous TID WC  . insulin glargine  20 Units Subcutaneous Daily  . insulin glargine  25 Units Subcutaneous QHS  . lidocaine  1 patch Transdermal Q24H  . multivitamin with minerals  1 tablet Oral Daily  . nystatin   Topical BID  . pregabalin  150 mg Oral TID  . senna-docusate  1 tablet Oral QHS  . sodium chloride flush  10-40 mL Intracatheter Q12H  . umeclidinium bromide  1 puff Inhalation Daily   Continuous Infusions:    LOS: 28 days    Time spent: 25 minutes    Alberteen Sam, MD [facility] 06/04/2020, 1:15 PM     Please page though AMION or Epic secure chat:  For [employer], Web designer

## 2020-06-04 NOTE — TOC Progression Note (Signed)
Transition of Care [facility]) - Progression Note    Patient Details  Name: Annette [name] MRN: [id number] Date of Birth: 1961/03/29  Transition of Care [facility]) CM/SW [place], LCSW Phone Number: 06/04/2020, 3:01 PM  Clinical Narrative:    CSW confirmed with CT PA that patient will require a home [employer] wound vac (small sponge kit, 8 or 10 changes) if teaching is successful with family tomorrow. CSW inquired with Marcelle Smiling, whether patient can still have a wound vac at home if there is no home health in place. She reported that patient can BUT measurements of the wound must be submitted every two weeks for insurance to pay for vac. Therefore, patient must follow up with CT surgery every two weeks after discharge to get measurements (or train family on how to measure).     Expected Discharge Plan: [place] Barriers to Discharge: No [place] will accept this patient  Expected Discharge Plan and Services Expected Discharge Plan: [place] In-house Referral: Clinical Social Work       Expected Discharge Date: 05/21/20               DME Arranged: Hospital bed,Other see comment (hoyer lift) DME Agency: [employer] Date DME Agency Contacted: 05/30/20 Time DME Agency Contacted: [phone] Representative spoke with at DME Agency: Adela Lank             Social Determinants of Health ([place]) Interventions    Readmission Risk Interventions No flowsheet data found.

## 2020-06-04 NOTE — TOC Progression Note (Signed)
Transition of Care [facility]) - Progression Note    Patient Details  Name: Annette [name] MRN: [id number] Date of Birth: Dec 20, 1960  Transition of Care [facility]) CM/SW Contact  Mearl Latin, LCSW Phone Number: 06/04/2020, 9:46 AM  Clinical Narrative:    Covering CSW notes patient's denials from home health agencies and request to return home rather than SNF placement. CSW will continue to follow for patient's ability to arrange family assistance for safe DC.    Expected Discharge Plan: Home w Home Health Services Barriers to Discharge: No Home Care Agency will accept this patient  Expected Discharge Plan and Services Expected Discharge Plan: Home w Home Health Services In-house Referral: Clinical Social Work       Expected Discharge Date: 05/21/20               DME Arranged: Hospital bed,Other see comment (hoyer lift) DME Agency: [employer] Date DME Agency Contacted: 05/30/20 Time DME Agency Contacted: [phone] Representative spoke with at DME Agency: Silvio Pate             Social Determinants of Health (SDOH) Interventions    Readmission Risk Interventions No flowsheet data found.

## 2020-06-04 NOTE — Discharge Instructions (Signed)
The right chest dressing may be removed on 05/22/20 and does not need to be replaced unless drainage occurs. Keep the right chest incisions clean with soap and water and may leave open to air.   Information on my medicine - ELIQUIS (apixaban)  Why was Eliquis prescribed for you? Eliquis was prescribed to treat blood clots that may have been found in the veins of your legs (deep vein thrombosis) or in your lungs (pulmonary embolism) and to reduce the risk of them occurring again.  What do You need to know about Eliquis ? The starting dose is 10 mg (two 5 mg tablets) taken TWICE daily for the FIRST SEVEN (7) DAYS, then on (enter date)  06/11/20  the dose is reduced to ONE 5 mg tablet taken TWICE daily.  Eliquis may be taken with or without food.   Try to take the dose about the same time in the morning and in the evening. If you have difficulty swallowing the tablet whole please discuss with your pharmacist how to take the medication safely.  Take Eliquis exactly as prescribed and DO NOT stop taking Eliquis without talking to the doctor who prescribed the medication.  Stopping may increase your risk of developing a new blood clot.  Refill your prescription before you run out.  After discharge, you should have regular check-up appointments with your healthcare provider that is prescribing your Eliquis.    What do you do if you miss a dose? If a dose of ELIQUIS is not taken at the scheduled time, take it as soon as possible on the same day and twice-daily administration should be resumed. The dose should not be doubled to make up for a missed dose.  Important Safety Information A possible side effect of Eliquis is bleeding. You should call your healthcare provider right away if you experience any of the following: ? Bleeding from an injury or your nose that does not stop. ? Unusual colored urine (red or dark brown) or unusual colored stools (red or black). ? Unusual bruising for unknown  reasons. ? A serious fall or if you hit your head (even if there is no bleeding).  Some medicines may interact with Eliquis and might increase your risk of bleeding or clotting while on Eliquis. To help avoid this, consult your healthcare provider or pharmacist prior to using any new prescription or non-prescription medications, including herbals, vitamins, non-steroidal anti-inflammatory drugs (NSAIDs) and supplements.  This website has more information on Eliquis (apixaban): http://www.eliquis.com/eliquis/home

## 2020-06-04 NOTE — Progress Notes (Signed)
Chest tube was removed at 12:40 with Cathey Endow RN

## 2020-06-05 ENCOUNTER — Inpatient Hospital Stay (HOSPITAL_COMMUNITY): Payer: Medicare Other

## 2020-06-05 LAB — BASIC METABOLIC PANEL
Anion gap: 9 (ref 5–15)
BUN: 16 mg/dL (ref 6–20)
CO2: 27 mmol/L (ref 22–32)
Calcium: 8.5 mg/dL — ABNORMAL LOW (ref 8.9–10.3)
Chloride: 97 mmol/L — ABNORMAL LOW (ref 98–111)
Creatinine, Ser: 0.6 mg/dL (ref 0.44–1.00)
GFR, Estimated: >60 mL/min (ref >60)
Glucose, Bld: 220 mg/dL — ABNORMAL HIGH (ref 70–99)
Potassium: 4.2 mmol/L (ref 3.5–5.1)
Sodium: 133 mmol/L — ABNORMAL LOW (ref 135–145)

## 2020-06-05 LAB — CBC WITH DIFFERENTIAL/PLATELET
Abs Immature Granulocytes: 0.06 10*3/uL (ref 0.00–0.07)
Basophils Absolute: 0 10*3/uL (ref 0.0–0.1)
Basophils Relative: 0 %
Eosinophils Absolute: 0.1 10*3/uL (ref 0.0–0.5)
Eosinophils Relative: 0 %
HCT: 29.4 % — ABNORMAL LOW (ref 36.0–46.0)
Hemoglobin: 9.5 g/dL — ABNORMAL LOW (ref 12.0–15.0)
Immature Granulocytes: 0 %
Lymphocytes Relative: 7 %
Lymphs Abs: 1 10*3/uL (ref 0.7–4.0)
MCH: 29 pg (ref 26.0–34.0)
MCHC: 32.3 g/dL (ref 30.0–36.0)
MCV: 89.6 fL (ref 80.0–100.0)
Monocytes Absolute: 0.5 10*3/uL (ref 0.1–1.0)
Monocytes Relative: 4 %
Neutro Abs: 12.4 10*3/uL — ABNORMAL HIGH (ref 1.7–7.7)
Neutrophils Relative %: 89 %
Platelets: 209 10*3/uL (ref 150–400)
RBC: 3.28 MIL/uL — ABNORMAL LOW (ref 3.87–5.11)
RDW: 16.2 % — ABNORMAL HIGH (ref 11.5–15.5)
WBC: 14 10*3/uL — ABNORMAL HIGH (ref 4.0–10.5)
nRBC: 0 % (ref 0.0–0.2)

## 2020-06-05 LAB — GLUCOSE, CAPILLARY
Glucose-Capillary: 169 mg/dL — ABNORMAL HIGH (ref 70–99)
Glucose-Capillary: 135 mg/dL — ABNORMAL HIGH (ref 70–99)
Glucose-Capillary: 98 mg/dL (ref 70–99)
Glucose-Capillary: 148 mg/dL — ABNORMAL HIGH (ref 70–99)

## 2020-06-05 LAB — MAGNESIUM: Magnesium: 1.6 mg/dL — ABNORMAL LOW (ref 1.7–2.4)

## 2020-06-05 NOTE — TOC Progression Note (Signed)
Transition of Care [facility]) - Progression Note    Patient Details  Name: Annette [name] MRN: [id number] Date of Birth: 10-23-1960  Transition of Care [facility]) CM/SW [place], LCSW Phone Number: 06/05/2020, 3:44 PM  Clinical Narrative:    CSW met with patient and her daughter-in-law, Annette [name], at bedside after wound vac change teaching. Patient and Annette [name] both confirm that patient still wants to discharge home even though home health could not be obtained. Patient lamented that she could not discharge today but CSW explained that we are waiting on her wound vac to arrive from [facility] (per [place], order is in process). Oxygen ordered through [employer]; they will contact Annette [name] for delivery coordination. Patient already has hospital bed, lift, 3in1, shower chair, grab bars and does not require any additional DME. Planning on patient discharge home tomorrow.    Expected Discharge Plan: Home/Self Care Barriers to Discharge: No [place] will accept this patient  Expected Discharge Plan and Services Expected Discharge Plan: Home/Self Care In-house Referral: Clinical Social Work Discharge Planning Services: CM Consult,DC out of service area Post Acute Care Choice: Durable Medical Equipment Living arrangements for the past 2 months: Single Family Home Expected Discharge Date: 05/21/20               DME Arranged: Hospital bed,Other see comment (hoyer lift) DME Agency: [employer] Date DME Agency Contacted: 05/30/20 Time DME Agency Contacted: [phone] Representative spoke with at DME Agency: [place] Arranged:  (Unable to be arranged)           Social Determinants of Health (SDOH) Interventions    Readmission Risk Interventions No flowsheet data found.

## 2020-06-05 NOTE — TOC Progression Note (Signed)
Transition of Care [facility]) - Progression Note    Patient Details  Name: Annette [name] MRN: [id number] Date of Birth: [birth year]  Transition of Care [facility]) CM/SW Contact  Mearl Latin, LCSW Phone Number: 06/05/2020, 1:50 PM  Clinical Narrative:    CSW awaiting results of wound vac teaching with patient's family and oxygen saturations requested from RN. Once obtained, will work on ordering other DME (hospital bed, 3in1, maybe hoyer lift) Wound vac form placed on patient's hard chart to be signed-PA aware. Patient will follow up with CT Surgery every two weeks for wound measurements. CSW confirmed with [employer] that wound vac supplied will be provided by them and they will mail patient any additional supplies needed.    Expected Discharge Plan: Home w Home Health Services Barriers to Discharge: No Home Care Agency will accept this patient  Expected Discharge Plan and Services Expected Discharge Plan: Home w Home Health Services In-house Referral: Clinical Social Work       Expected Discharge Date: 05/21/20               DME Arranged: Hospital bed,Other see comment (hoyer lift) DME Agency: [employer] Date DME Agency Contacted: 05/30/20 Time DME Agency Contacted: [phone] Representative spoke with at DME Agency: Silvio Pate             Social Determinants of Health (SDOH) Interventions    Readmission Risk Interventions No flowsheet data found.

## 2020-06-05 NOTE — Progress Notes (Cosign Needed)
    Durable Medical Equipment  (From admission, onward)         Start     Ordered   06/04/20 1833  For home use only DME 3 n 1  Once        06/04/20 1833   06/04/20 1833  For home use only DME Hospital bed  Once       Question Answer Comment  Length of Need 12 Months   Patient has (list medical condition): pneumothorax and severe COPD and pneumonia and hemothorax   The above medical condition requires: Patient requires the ability to reposition frequently   Head must be elevated greater than: 30 degrees   Bed type Semi-electric   Hoyer Lift Yes   Support Surface: Alternating Pressure Pad and Pump      06/04/20 1833   05/30/20 1432  For home use only DME Hospital bed  Once       Question Answer Comment  Length of Need 6 Months   The above medical condition requires: Patient requires the ability to reposition frequently   Bed type Semi-electric   Hoyer Lift Yes   Support Surface: Low Air loss Mattress      05/30/20 1431

## 2020-06-05 NOTE — Progress Notes (Signed)
Physical Therapy Treatment Patient Details Name: Annette [name] MRN: [id number] DOB: [birth year] Today's Date: 06/05/2020    History of Present Illness 59 yo female admitted to [facility] on 05/03/2020 with R upper lobe MRSA pneumonia and R lower lobe subsegmental PE. Developed right-sided hydropneumothorax on 11/22 underwent chest tube insertion and on 05/06/2020 transfered to [facility] for CTS evaluation and treatment. Additional R chest tube placement 11/24.  On 11/28, patient underwent VATS/thoracotomy mechanical pleurodesis and wound VAC placement. 3rd chest tube to be placed 12/9. 12/16 one R chest tube removed.  PMH including Covid pneumonia approximately 2 months ago, recent PE on Eliquis, COPD, asthma, depression, hypertension, and diabetes.    PT Comments    Patient received in bed, initially attempting to refuse therapy because "I am going home" and needed max encouragement to participate; has very poor awareness of safety or extent of physical deficits/impairments. Still needs ModAx2 for functional bed mobility and transfers with RW as well as max cues for hand placement and safety with very little carryover from past sessions. Attempted lateral side steps at EOB but unable and promptly sits back down at EOB without warning. See accompanying O2 note for saturations. Left in bed with daughter in law present, all needs otherwise met and RN aware of patient status. Would very much benefit from SNF but continues to refuse.     Follow Up Recommendations  SNF     Equipment Recommendations  3in1 (PT);Hospital bed;Other (comment) (hoyer lift)    Recommendations for Other Services       Precautions / Restrictions Precautions Precautions: Fall Precaution Comments: wound va R side, chest tubes now removed Restrictions Weight Bearing Restrictions: No    Mobility  Bed Mobility Overal bed mobility: Needs Assistance Bed Mobility: Supine to Sit;Sit to Supine      Supine to sit: Mod assist;+2 for physical assistance Sit to supine: Mod assist;+2 for physical assistance   General bed mobility comments: Assist for lines/leads, as well as for trunk elevation and BLE movement off/on bed  Transfers Overall transfer level: Needs assistance Equipment used: Rolling walker (2 wheeled) Transfers: Sit to/from Stand Sit to Stand: Mod assist;+2 physical assistance;+2 safety/equipment;From elevated surface         General transfer comment: ModAx2 to power up, on first attempt she kept her trunk flexed over at almost 70 degrees and said she could not stand up right, on second attempt she stood upright without difficulty. Attempted but unable to take lateral side steps up EOB  Ambulation/Gait             General Gait Details: unable to progress LEs   Stairs             Wheelchair Mobility    Modified Rankin (Stroke Patients Only)       Balance Overall balance assessment: Needs assistance Sitting-balance support: No upper extremity supported;Feet supported Sitting balance-Leahy Scale: Fair Sitting balance - Comments: able to sit EOB without PT/OT support   Standing balance support: Bilateral upper extremity supported Standing balance-Leahy Scale: Poor Standing balance comment: Pt requriring modA+2, RW and power-up assist for sit to stand                            Cognition Arousal/Alertness: Awake/alert Behavior During Therapy: Flat affect Overall Cognitive Status: No family/caregiver present to determine baseline cognitive functioning Area of Impairment: Safety/judgement  Safety/Judgement: Decreased awareness of safety;Decreased awareness of deficits     General Comments: states that she does not need to do therapy as she is leaving today, needed max encouragement today; basically zero insight into current deficits and safety, keeps giving multiple non-sensical reasons as to why she  cannot mobilize here and how she will do better at home      Exercises      General Comments General comments (skin integrity, edema, etc.): desats to 84% on RA,  increase to 86-87% on 2LPM, and needed 3LPM with SPO2 at 87-93% with activity, cues for breathing technique      Pertinent Vitals/Pain Pain Assessment: Faces Faces Pain Scale: Hurts little more Pain Location: R shoulder Pain Descriptors / Indicators: Discomfort Pain Intervention(s): Limited activity within patient's tolerance;Monitored during session    Home Living                      Prior Function            PT Goals (current goals can now be found in the care plan section) Acute Rehab PT Goals Patient Stated Goal: get home PT Goal Formulation: With patient Time For Goal Achievement: 06/10/20 Potential to Achieve Goals: Fair Progress towards PT goals: Progressing toward goals    Frequency    Min 3X/week      PT Plan Current plan remains appropriate    Co-evaluation              AM-PAC PT "6 Clicks" Mobility   Outcome Measure  Help needed turning from your back to your side while in a flat bed without using bedrails?: A Lot Help needed moving from lying on your back to sitting on the side of a flat bed without using bedrails?: A Lot Help needed moving to and from a bed to a chair (including a wheelchair)?: A Lot Help needed standing up from a chair using your arms (e.g., wheelchair or bedside chair)?: A Lot Help needed to walk in hospital room?: Total Help needed climbing 3-5 steps with a railing? : Total 6 Click Score: 10    End of Session Equipment Utilized During Treatment: Oxygen;Other (comment) (wound vac) Activity Tolerance: Patient tolerated treatment well Patient left: in bed;with call bell/phone within reach;with family/visitor present Nurse Communication: Mobility status;Other (comment) (O2 sats) PT Visit Diagnosis: Muscle weakness (generalized) (M62.81)     Time:  3335-4562 PT Time Calculation (min) (ACUTE ONLY): 25 min  Charges:  $Therapeutic Activity: 23-37 mins                     Windell Norfolk, DPT, PN1   Supplemental Physical Therapist [place]    Pager [phone] Acute Rehab Office [phone]

## 2020-06-05 NOTE — Progress Notes (Signed)
Please see in addition to PT note:   SATURATION QUALIFICATIONS: (This note is used to comply with regulatory documentation for home oxygen)  Patient Saturations on Room Air at Rest = 84%  Patient Saturations on Room Air while Ambulating = did not test due to desaturation on room air at rest   Patient Saturations on 2 Liters of oxygen while Ambulating = 86% (needs 3LPM to say within reasonable saturation range)  Please briefly explain why patient needs home oxygen:desaturation on room air at rest and with activity  Madelaine Etienne, DPT, PN1   Supplemental Physical Therapist [facility]    Pager [phone] Acute Rehab Office [phone]

## 2020-06-05 NOTE — Plan of Care (Signed)

## 2020-06-05 NOTE — Progress Notes (Signed)
Nutrition Follow-up  DOCUMENTATION CODES:   Obesity unspecified  INTERVENTION:    Snacks BID  Double protein with meals   MVI with minerals daily  NUTRITION DIAGNOSIS:   Increased nutrient needs related to wound healing,acute illness (recent COVID PNA) as evidenced by estimated needs.  Ongoing  GOAL:   Patient will meet greater than or equal to 90% of their needs   Stable per patient   MONITOR:   PO intake,Supplement acceptance,Skin  REASON FOR ASSESSMENT:   Malnutrition Screening Tool    ASSESSMENT:   59 yo female admitted from [facility] for treatment of a complicated pneumothorax in the setting of MRSA PNA. PMH includes COVID PNA 2 months PTA, recent PE, COPD, HTN, DM, asthma.   11/28- VATS, thoracotomy R pleurodesis, evacuation hematoma 12/08- new R PTX, chest tube placed   R chest tube has been removed. Has open wound L chest with VAC. Plan to go home with [facility] and daughter to perform dressing changes.   Appetite remains stable per patient. No meal completions documented since 12/15. Continue with snacks and double protein at meals.   Admission weight: 117.9 kg  Current weight: 106.5 (last taken on 12/19)  Medications: dulcolax, vitamin D, glipizide, SS novolog, lantus, senokot  Labs: Na 133 (L) Mg 1.6 (L) CBG 75-202  Diet Order:   Diet Order            Diet Carb Modified Fluid consistency: Thin; Room service appropriate? Yes  Diet effective now           Diet - low sodium heart healthy                 EDUCATION NEEDS:   Not appropriate for education at this time  Skin:  Skin Assessment: Skin Integrity Issues: Skin Integrity Issues:: Stage II,Incisions Stage II: coccyx Incisions: R chest x3  Last BM:  12/22  Height:   Ht Readings from Last 1 Encounters:  05/17/20 5\' 8"  (1.727 m)    Weight:   Wt Readings from Last 1 Encounters:  06/02/20 106.5 kg    Ideal Body Weight:  63.6 kg  BMI:  Body mass index is 35.7  kg/m.  Estimated Nutritional Needs:   Kcal:  2300-2500  Protein:  125-150 gm  Fluid:  >/= 2 L  06/04/20 RD, LDN Clinical Nutrition Pager listed in AMION

## 2020-06-05 NOTE — Progress Notes (Signed)
[address]       [place] [address]             [phone]      19 Days Post-Op Procedure(s) (LRB): RIGHT CHEST WALL WOUND CLOSURE REMOVAL OF WOUND VAC (Right) Subjective:  No new concerns.  Annette [name] daughter-in-law who is a CNA was present during the vac change today.   Objective: Vital signs in last 24 hours: Temp:  [98.5 F (36.9 C)-98.7 F (37.1 C)] 98.5 F (36.9 C) (12/22 0616) Pulse Rate:  [89-100] 100 (12/22 1352) Resp:  [16-20] 16 (12/22 1352) BP: (107-120)/(67-89) 107/67 (12/22 1352) SpO2:  [93 %-96 %] 95 % (12/22 1352)      Intake/Output from previous day: 12/21 0701 - 12/22 0700 In: 10 [I.V.:10] Out: -  Intake/Output this shift: No intake/output data recorded.  General appearance: alert, cooperative and no distress Wound: VAC in place and functioning appropriately. Minimal drainage since the vac was changed on 2/20.  The vac dressing was removed from suction then taken out of the wound. The wound is clean and dry.  A new vac sponge was fashioned and placed back into the wound and connected to the wound vac pump. It functioned properly.   The right pigtail catheter was removed yesterday.   Lab Results: Recent Labs    06/05/20 0938  WBC 14.0*  HGB 9.5*  HCT 29.4*  PLT 209   BMET:  Recent Labs    06/05/20 0938  NA 133*  K 4.2  CL 97*  CO2 27  GLUCOSE 220*  BUN 16  CREATININE 0.60  CALCIUM 8.5*    PT/INR: No results for input(s): LABPROT, INR in the last 72 hours. ABG    Component Value Date/Time   PHART 7.400 05/12/2020 1524   HCO3 36.1 (H) 05/12/2020 1524   TCO2 38 (H) 05/12/2020 1524   O2SAT 99.0 05/12/2020 1524   CBG (last 3)  Recent Labs    06/04/20 2246 06/05/20 0750 06/05/20 1136  GLUCAP 131* 169* 135*    Meds Scheduled Meds: . apixaban  10 mg Oral BID   Followed by  . [START ON 06/11/2020] apixaban  5 mg Oral BID  . atorvastatin  40 mg Oral Daily  . bisacodyl  10 mg Oral Daily  .  cephALEXin  500 mg Oral Q8H  . Chlorhexidine Gluconate Cloth  6 each Topical Daily  . cholecalciferol  1,000 Units Oral Daily  . clonazePAM  0.5 mg Oral BID  . fenofibrate  160 mg Oral Daily  . Gerhardt's butt cream   Topical BID  . glipiZIDE  5 mg Oral BID AC  . guaiFENesin  600 mg Oral BID  . insulin aspart  0-20 Units Subcutaneous TID WC  . insulin aspart  0-5 Units Subcutaneous QHS  . insulin aspart  8 Units Subcutaneous TID WC  . insulin glargine  20 Units Subcutaneous Daily  . insulin glargine  25 Units Subcutaneous QHS  . lidocaine  1 patch Transdermal Q24H  . multivitamin with minerals  1 tablet Oral Daily  . nystatin   Topical BID  . pregabalin  150 mg Oral TID  . senna-docusate  1 tablet Oral QHS  . sodium chloride flush  10-40 mL Intracatheter Q12H  . umeclidinium bromide  1 puff Inhalation Daily   Continuous Infusions:  PRN Meds:.acetaminophen, influenza vac split quadrivalent PF, levalbuterol, magnesium citrate, ondansetron (ZOFRAN) IV, oxyCODONE, pneumococcal 23 valent vaccine, polyethylene glycol, sodium chloride flush,  traMADol  Xrays   EXAM: CHEST - 2 VIEW  COMPARISON:  06/03/2020  FINDINGS: Right chest tube is been removed. Right pleural density appears similar. No discernible pleural air. Subcutaneous emphysema is similar. Prominent interstitial lung markings appear the same. No worsening or new finding.  IMPRESSION: Right chest tube removed. No discernible pleural air. Persistent subcutaneous emphysema. No change in appearance of the pulmonary and pleural density.   Electronically Signed   By: Paulina Fusi M.D.   On: 06/05/2020 09:08   Assessment/Plan: S/P Procedure(s) (LRB): RIGHT CHEST WALL WOUND CLOSURE REMOVAL OF WOUND VAC (Right)  -Open wound left chest- wound vac last changed at the bedside 12/20. The wound is ~2x2 cm at the surface and ~ 5cm deep. The tissues are clean and dry.  Cx obtained 05/31/20 grew Proteus, on oral Keflex.   The wound vac was changed today with [name]'s daughter-in-law, Annette [name],  in attendance. She is a CNA and is willing to do the dressing changes at home after discharge.  This may be an acceptable option since the care management team was unable to find a home health agency in the [place] area to do dressing changes.  The portable vac has not yet been delivered so I have asked Morrie Sheldon to return tomorrow to demonstrate the vac change with the portable pump.  If this is successful, pt may be discharged to home tomorrow afternoon.   -Right PTX- Resolved, sub-Q air is dissipating. The CXR noted.    LOS: 29 days    Leary Roca PA-C [phone] 06/05/2020

## 2020-06-05 NOTE — Progress Notes (Signed)
PROGRESS NOTE  Annette [name]  DOB: [birth year]  PCP: Patient, No Pcp Per GYI:[id number]  DOA: 05/07/2020  LOS: 29 days   Chief complaint: Cough, shortness of breath  Brief narrative: Mrs. Annette [name] is a 59 y.o. F with obesity, DM, and COPD not on home O2 who presented to OSH with cough and SOB. At OSH [facility], she was found to have MRSA pneumonia of the right upper lobe, as well as a right lower lobe subsegmental PE.  No need she was treated with linezolid and Lovenox and unfortunately developed a right hydropneumothorax.  A chest tube was placed and she was transferred for CT surgery consultation.  11/23: Transferred from OSH, CT chest showed moderately large right pneumothorax, subcutaneous emphysema, second chest tube placed 11/26: Complicated by hemothorax, acute blood loss anemia 11/28: To OR for evacuation of hematoma, mechanical pleurodesis and wound VAC placement 12/3 Right chest wall wound closure and removal of wound VAC 12/6: Chest tubes removed 12/7 Acute worsening subcutaneous emphysema, including into the face, chest tube replaced 12/9: Second right-sided chest tube placed to persistent right pneumothorax 12/15: Large bore chest tube removed, one CT left in place 12/17: Drainage from a right lateral chest incision; this was opened by CT surgery, culture growing Proteus, second wound VAC placed 12/20: Wound vac removed at bedside, reapplied 12/21: CT surgery removed last chest tube  Subjective: Patient was seen and examined this morning. Pleasant middle-aged female.  Lying down in bed.  Not in distress.  Daughter-in-law on the phone  Assessment/Plan: MRSA pneumonia Resolved  Complicated pneumothorax Superficial wound infection due to Proteus mirabilis -Chest tube removed.  Wound care per CT surgery guidance. -The current (2nd) wound vac was placed due to drainage from an incision.  Culture of this drainage growing pan-sensitive Proteus.   -Defer wound  VAC and chest tube management to CT surgery -Narrow CTX to cephalexin for 5 more days  Pulmonary embolism, acute This was diagnosed prior to transfer here. Anticoagulation was stopped here due to hemothorax.  CT chest 3 days ago showed improvement in her nonocclusive, resolving PE. -Anticoagulation with apixaban has been resumed.  Diabetes, with hyperglycemia Glucoses difficult to control despite daily increases in insulin regimen -Continue Lantus BID --> patient will need this at discharge -Resume glipizide -Continue atorvastatin, fibrate -Continue SS ccorrections and mealtime insulin  COPD No evidence of exacerbation -Continue LAMA  Acute blood loss anemia Hemoglobin stable  Stage II coccyx pressure injury, present on admission  Obesity BMI 35   Mobility: Encourage ambulation Code Status:   Code Status: Partial Code  Nutritional status: Body mass index is 35.7 kg/m. Nutrition Problem: Increased nutrient needs Etiology: wound healing,acute illness (recent COVID PNA) Signs/Symptoms: estimated needs Diet Order            Diet Carb Modified Fluid consistency: Thin; Room service appropriate? Yes  Diet effective now           Diet - low sodium heart healthy                 DVT prophylaxis: SCD's Start: 05/12/20 1735 apixaban (ELIQUIS) tablet 10 mg  apixaban (ELIQUIS) tablet 5 mg   Antimicrobials:  Keflex Fluid: None Consultants: CT surgery Family Communication:  Hopefully home tomorrow  Status is: Inpatient  Remains inpatient appropriate because:Pending dressing changes with instructions by CT surgery tomorrow.   Dispo: The patient is from: Home              Anticipated d/c is to: Home  Anticipated d/c date is: 1 day              Patient currently is not medically stable to d/c.   Infusions:    Scheduled Meds: . apixaban  10 mg Oral BID   Followed by  . [START ON 06/11/2020] apixaban  5 mg Oral BID  . atorvastatin  40 mg Oral  Daily  . bisacodyl  10 mg Oral Daily  . cephALEXin  500 mg Oral Q8H  . Chlorhexidine Gluconate Cloth  6 each Topical Daily  . cholecalciferol  1,000 Units Oral Daily  . clonazePAM  0.5 mg Oral BID  . fenofibrate  160 mg Oral Daily  . Gerhardt's butt cream   Topical BID  . glipiZIDE  5 mg Oral BID AC  . guaiFENesin  600 mg Oral BID  . insulin aspart  0-20 Units Subcutaneous TID WC  . insulin aspart  0-5 Units Subcutaneous QHS  . insulin aspart  8 Units Subcutaneous TID WC  . insulin glargine  20 Units Subcutaneous Daily  . insulin glargine  25 Units Subcutaneous QHS  . lidocaine  1 patch Transdermal Q24H  . multivitamin with minerals  1 tablet Oral Daily  . nystatin   Topical BID  . pregabalin  150 mg Oral TID  . senna-docusate  1 tablet Oral QHS  . sodium chloride flush  10-40 mL Intracatheter Q12H  . umeclidinium bromide  1 puff Inhalation Daily    Antimicrobials: Anti-infectives (From admission, onward)   Start     Dose/Rate Route Frequency Ordered Stop   06/04/20 0600  cephALEXin (KEFLEX) capsule 500 mg        500 mg Oral Every 8 hours 06/03/20 1431 06/09/20 1317   06/02/20 1000  cefTRIAXone (ROCEPHIN) 2 g in sodium chloride 0.9 % 100 mL IVPB  Status:  Discontinued        2 g 200 mL/hr over 30 Minutes Intravenous Daily 06/02/20 0734 06/03/20 1431   05/17/20 1345  ceFAZolin (ANCEF) IVPB 2g/100 mL premix  Status:  Discontinued        2 g 200 mL/hr over 30 Minutes Intravenous On call 05/17/20 1335 05/18/20 1057   05/17/20 1315  ceFAZolin (ANCEF) powder 1 g  Status:  Discontinued        1 g Other To Surgery 05/17/20 1306 05/17/20 1336   05/10/20 1300  fluconazole (DIFLUCAN) tablet 150 mg        150 mg Oral every 72 hours 05/10/20 1151 05/13/20 1408   05/10/20 0900  Ampicillin-Sulbactam (UNASYN) 3 g in sodium chloride 0.9 % 100 mL IVPB        3 g 200 mL/hr over 30 Minutes Intravenous Every 6 hours 05/10/20 0753 05/17/20 0859   05/08/20 0030  linezolid (ZYVOX) tablet 600 mg   Status:  Discontinued        600 mg Oral Every 12 hours 05/08/20 0015 05/14/20 1113      PRN meds: acetaminophen, influenza vac split quadrivalent PF, levalbuterol, magnesium citrate, ondansetron (ZOFRAN) IV, oxyCODONE, pneumococcal 23 valent vaccine, polyethylene glycol, sodium chloride flush, traMADol   Objective: Vitals:   06/05/20 0800 06/05/20 1352  BP:  107/67  Pulse:  100  Resp: 16 16  Temp:    SpO2: 93% 95%   No intake or output data in the 24 hours ending 06/05/20 1533 Filed Weights   05/30/20 0600 05/31/20 0610 06/02/20 0500  Weight: 101.4 kg 101.5 kg 106.5 kg   Weight change:  Body mass  index is 35.7 kg/m.   Physical Exam: General exam: Pleasant, not in distress Skin: No rashes, lesions or ulcers. HEENT: Atraumatic, normocephalic, no obvious bleeding Lungs: Clear to auscultate bilaterally CVS: Regular rate and rhythm, no murmur GI/Abd soft, nontender, nondistended, bowel sound present CNS: Alert, awake, oriented x3 Psychiatry: Mood appropriate Extremities: No pedal edema, no calf redness  Data Review: I have personally reviewed the laboratory data and studies available.  Recent Labs  Lab 05/30/20 0038 05/31/20 1801 06/01/20 0238 06/02/20 0442 06/05/20 0938  WBC 8.0 5.4 4.8 5.8 14.0*  NEUTROABS 5.7 3.8 2.9 3.8 12.4*  HGB 8.9* 8.9* 8.7* 8.6* 9.5*  HCT 26.9* 27.8* 28.7* 28.3* 29.4*  MCV 91.8 90.0 92.3 92.8 89.6  PLT 181 196 189 181 209   Recent Labs  Lab 05/30/20 0038 05/31/20 1801 06/01/20 0238 06/02/20 0442 06/05/20 0938  NA 135 135 135 134* 133*  K 4.8 4.3 4.6 4.5 4.2  CL 100 98 99 98 97*  CO2 29 28 28 26 27   GLUCOSE 215* 329* 271* 350* 220*  BUN 19 17 19 19 16   CREATININE 0.74 0.66 0.63 0.67 0.60  CALCIUM 8.6* 8.6* 8.7* 8.6* 8.5*  MG 1.7 1.7 1.6* 1.7 1.6*  PHOS 3.3 3.6 4.0 4.1  --     F/u labs ordered  Signed, , MD [facility] 06/05/2020

## 2020-06-05 NOTE — Progress Notes (Signed)
Patient resting in bed on 3L [place] with Sats 95%. While the patient was doing exercises in the bed I removed the oxygen and her saturations dropped to 84%. I reapplied 3L [place] and her sats recovered to 92% while exercising.

## 2020-06-06 LAB — GLUCOSE, CAPILLARY
Glucose-Capillary: 135 mg/dL — ABNORMAL HIGH (ref 70–99)
Glucose-Capillary: 123 mg/dL — ABNORMAL HIGH (ref 70–99)
Glucose-Capillary: 108 mg/dL — ABNORMAL HIGH (ref 70–99)
Glucose-Capillary: 156 mg/dL — ABNORMAL HIGH (ref 70–99)

## 2020-06-06 NOTE — Progress Notes (Addendum)
[address]       [place] [address]             [phone]      20 Days Post-Op Procedure(s) (LRB): RIGHT CHEST WALL WOUND CLOSURE REMOVAL OF WOUND VAC (Right) Subjective:  No new concerns, maintaining adequate O2 saturations on O2 at 3L/min.  No evidence of recurrent subQ air after removal of the pigtail catheter.  She is very eager to return home.   Objective: Vital signs in last 24 hours: Temp:  [97.9 F (36.6 C)-98.4 F (36.9 C)] 97.9 F (36.6 C) (12/23 0541) Pulse Rate:  [60-100] 60 (12/23 0541) Cardiac Rhythm: Normal sinus rhythm (12/23 0740) Resp:  [16-19] 18 (12/23 0541) BP: (95-107)/(62-75) 95/62 (12/23 0541) SpO2:  [95 %-98 %] 97 % (12/23 0740) FiO2 (%):  [32 %] 32 % (12/23 0740)     Intake/Output from previous day: 12/22 0701 - 12/23 0700 In: -  Out: 1450 [Urine:1450] Intake/Output this shift: No intake/output data recorded.  General appearance: alert, cooperative and no distress Wound: VAC in place and functioning appropriately. Minimal drainage since the vac was changed yesterday.  The wound at the skin level measures only about 2 - 2.5cm in diameter and has decreased in size since opened at the bedside 7 days ago.  But the wound opens into a much larger undermined cavity that is difficult to measure accurately.    Lab Results: Recent Labs    06/05/20 0938  WBC 14.0*  HGB 9.5*  HCT 29.4*  PLT 209   BMET:  Recent Labs    06/05/20 0938  NA 133*  K 4.2  CL 97*  CO2 27  GLUCOSE 220*  BUN 16  CREATININE 0.60  CALCIUM 8.5*    PT/INR: No results for input(s): LABPROT, INR in the last 72 hours. ABG    Component Value Date/Time   PHART 7.400 05/12/2020 1524   HCO3 36.1 (H) 05/12/2020 1524   TCO2 38 (H) 05/12/2020 1524   O2SAT 99.0 05/12/2020 1524   CBG (last 3)  Recent Labs    06/05/20 1612 06/05/20 2029 06/06/20 0731  GLUCAP 98 148* 135*    Meds Scheduled Meds: . apixaban  10 mg Oral BID   Followed by   . [START ON 06/11/2020] apixaban  5 mg Oral BID  . atorvastatin  40 mg Oral Daily  . bisacodyl  10 mg Oral Daily  . cephALEXin  500 mg Oral Q8H  . Chlorhexidine Gluconate Cloth  6 each Topical Daily  . cholecalciferol  1,000 Units Oral Daily  . clonazePAM  0.5 mg Oral BID  . fenofibrate  160 mg Oral Daily  . Malee Grays's butt cream   Topical BID  . glipiZIDE  5 mg Oral BID AC  . guaiFENesin  600 mg Oral BID  . insulin aspart  0-20 Units Subcutaneous TID WC  . insulin aspart  0-5 Units Subcutaneous QHS  . insulin aspart  8 Units Subcutaneous TID WC  . insulin glargine  20 Units Subcutaneous Daily  . insulin glargine  25 Units Subcutaneous QHS  . lidocaine  1 patch Transdermal Q24H  . multivitamin with minerals  1 tablet Oral Daily  . nystatin   Topical BID  . pregabalin  150 mg Oral TID  . senna-docusate  1 tablet Oral QHS  . sodium chloride flush  10-40 mL Intracatheter Q12H  . umeclidinium bromide  1 puff Inhalation Daily   Continuous Infusions:  PRN  Meds:.acetaminophen, influenza vac split quadrivalent PF, levalbuterol, magnesium citrate, ondansetron (ZOFRAN) IV, oxyCODONE, pneumococcal 23 valent vaccine, polyethylene glycol, sodium chloride flush, traMADol    Assessment/Plan: S/P Procedure(s) (LRB): RIGHT CHEST WALL WOUND CLOSURE REMOVAL OF WOUND VAC (Right)  -Open wound left chest- wound vac last changed at the bedside yesterday. The wound is ~2x2 cm at the surface but opens into a larger undermined cavity. The tissues are clean and dry.  Cx obtained 05/31/20 grew Proteus, on oral Keflex.  The wound vac was changed yesterday with Ms. Adami's daughter-in-law, Taci Sterling,  in attendance. She is a CNA and is willing to do the dressing changes at home after discharge. However,  I am concerned that this wound will be difficult to manage at home without home health nursing. It may actually need to be opened further to adequately pack it because the opening through the skin  is constricting faster than the deep cavity. I explained to [name] that we will need to continue wound care here in the hospital for now.     LOS: 30 days    Leary Roca PA-C [phone] 06/06/2020  Patients daughter was not able to do wound vac changes - chest tube has been removed no increase in sq air  I have seen and examined Annette [name] and agree with the above assessment  and plan.  Delight Ovens MD Beeper [phone] Office [phone] 06/06/2020 4:15 PM

## 2020-06-06 NOTE — Progress Notes (Signed)
PROGRESS NOTE  Annette [name]  DOB: 03/30/1961  PCP: Patient, No Pcp Per [id number]  DOA: 05/07/2020  LOS: 30 days   Chief complaint: Cough, shortness of breath  Brief narrative: Annette [name] is a 59 y.o. F with obesity, DM, and COPD not on home O2 who presented to OSH with cough and SOB. At OSH [facility], she was found to have MRSA pneumonia of the right upper lobe, as well as a right lower lobe subsegmental PE.  No need she was treated with linezolid and Lovenox and unfortunately developed a right hydropneumothorax.  A chest tube was placed and she was transferred for CT surgery consultation.  11/23: Transferred from OSH, CT chest showed moderately large right pneumothorax, subcutaneous emphysema, second chest tube placed 11/26: Complicated by hemothorax, acute blood loss anemia 11/28: To OR for evacuation of hematoma, mechanical pleurodesis and wound VAC placement 12/3 Right chest wall wound closure and removal of wound VAC 12/6: Chest tubes removed 12/7 Acute worsening subcutaneous emphysema, including into the face, chest tube replaced 12/9: Second right-sided chest tube placed to persistent right pneumothorax 12/15: Large bore chest tube removed, one CT left in place 12/17: Drainage from a right lateral chest incision; this was opened by CT surgery, culture growing Proteus, second wound VAC placed 12/20: Wound vac removed at bedside, reapplied 12/21: CT surgery removed last chest tube  Patient's hospitalization is now prolonged because of need of skilled wound care.  CT surgery wants to continue it in the hospital for now  Subjective: Patient was seen and examined this morning. Pleasant middle-aged female.  She was excited that she was going go home today with the plan was placed on hold for CT surgery evaluated her wound again.  Assessment/Plan: MRSA pneumonia Resolved  Complicated pneumothorax on the right Superficial wound infection due to Proteus  mirabilis -Chest tube removed.  Wound care per CT surgery guidance. -The current (2nd) wound vac was placed due to drainage from an incision.  Culture of this drainage growing pan-sensitive Proteus.   -Defer wound VAC and chest tube management to CT surgery.  Per CT surgery, patient need to continue skilled wound care in the hospital for now.  She may also needed to be open further to adequately pack it.  -Continue Keflex for now.  Pulmonary embolism, acute This was diagnosed prior to transfer here. Anticoagulation was stopped here due to hemothorax.  CT chest 3 days ago showed improvement in her nonocclusive, resolving PE. -Anticoagulation with apixaban has been resumed.  Diabetes, with hyperglycemia -A1c 10.7 on 11/24. -Currently on Lantus twice daily -20 units and 25 units, premeal sliding scale insulin and glipizide 5 mg twice daily  Recent Labs  Lab 06/05/20 1136 06/05/20 1612 06/05/20 2029 06/06/20 0731 06/06/20 1145  GLUCAP 135* 98 148* 135* 123*   Hyperlipidemia -Continue atorvastatin, fibrate  COPD -No evidence of exacerbation -Continue LAMA  Acute blood loss anemia -Hemoglobin stable  Stage II coccyx pressure injury, present on admission  Obesity BMI 35   Mobility: Encourage ambulation Code Status:   Code Status: Partial Code  Nutritional status: Body mass index is 35.7 kg/m. Nutrition Problem: Increased nutrient needs Etiology: wound healing,acute illness (recent COVID PNA) Signs/Symptoms: estimated needs Diet Order            Diet Carb Modified Fluid consistency: Thin; Room service appropriate? Yes  Diet effective now           Diet - low sodium heart healthy  DVT prophylaxis: SCD's Start: 05/12/20 1735 apixaban (ELIQUIS) tablet 10 mg  apixaban (ELIQUIS) tablet 5 mg   Antimicrobials:  Keflex Fluid: None Consultants: CT surgery Family Communication:  Discussed with patient's daughter in law on the phone this  morning  Status is: Inpatient  Remains inpatient appropriate because:Pending dressing changes with instructions by CT surgery tomorrow.  Dispo: The patient is from: Home              Anticipated d/c is to: Home              Anticipated d/c date is: Unclear at this time              Patient currently is not medically stable to d/c.   Infusions:    Scheduled Meds: . apixaban  10 mg Oral BID   Followed by  . [START ON 06/11/2020] apixaban  5 mg Oral BID  . atorvastatin  40 mg Oral Daily  . bisacodyl  10 mg Oral Daily  . cephALEXin  500 mg Oral Q8H  . Chlorhexidine Gluconate Cloth  6 each Topical Daily  . cholecalciferol  1,000 Units Oral Daily  . clonazePAM  0.5 mg Oral BID  . fenofibrate  160 mg Oral Daily  . Gerhardt's butt cream   Topical BID  . glipiZIDE  5 mg Oral BID AC  . guaiFENesin  600 mg Oral BID  . insulin aspart  0-20 Units Subcutaneous TID WC  . insulin aspart  0-5 Units Subcutaneous QHS  . insulin aspart  8 Units Subcutaneous TID WC  . insulin glargine  20 Units Subcutaneous Daily  . insulin glargine  25 Units Subcutaneous QHS  . lidocaine  1 patch Transdermal Q24H  . multivitamin with minerals  1 tablet Oral Daily  . nystatin   Topical BID  . pregabalin  150 mg Oral TID  . senna-docusate  1 tablet Oral QHS  . sodium chloride flush  10-40 mL Intracatheter Q12H  . umeclidinium bromide  1 puff Inhalation Daily    Antimicrobials: Anti-infectives (From admission, onward)   Start     Dose/Rate Route Frequency Ordered Stop   06/04/20 0600  cephALEXin (KEFLEX) capsule 500 mg        500 mg Oral Every 8 hours 06/03/20 1431 06/09/20 1317   06/02/20 1000  cefTRIAXone (ROCEPHIN) 2 g in sodium chloride 0.9 % 100 mL IVPB  Status:  Discontinued        2 g 200 mL/hr over 30 Minutes Intravenous Daily 06/02/20 0734 06/03/20 1431   05/17/20 1345  ceFAZolin (ANCEF) IVPB 2g/100 mL premix  Status:  Discontinued        2 g 200 mL/hr over 30 Minutes Intravenous On call  05/17/20 1335 05/18/20 1057   05/17/20 1315  ceFAZolin (ANCEF) powder 1 g  Status:  Discontinued        1 g Other To Surgery 05/17/20 1306 05/17/20 1336   05/10/20 1300  fluconazole (DIFLUCAN) tablet 150 mg        150 mg Oral every 72 hours 05/10/20 1151 05/13/20 1408   05/10/20 0900  Ampicillin-Sulbactam (UNASYN) 3 g in sodium chloride 0.9 % 100 mL IVPB        3 g 200 mL/hr over 30 Minutes Intravenous Every 6 hours 05/10/20 0753 05/17/20 0859   05/08/20 0030  linezolid (ZYVOX) tablet 600 mg  Status:  Discontinued        600 mg Oral Every 12 hours 05/08/20 0015 05/14/20 1113  PRN meds: acetaminophen, influenza vac split quadrivalent PF, levalbuterol, magnesium citrate, ondansetron (ZOFRAN) IV, oxyCODONE, pneumococcal 23 valent vaccine, polyethylene glycol, sodium chloride flush, traMADol   Objective: Vitals:   06/06/20 0541 06/06/20 0740  BP: 95/62   Pulse: 60   Resp: 18   Temp: 97.9 F (36.6 C)   SpO2: 98% 97%    Intake/Output Summary (Last 24 hours) at 06/06/2020 1329 Last data filed at 06/06/2020 0400 Gross per 24 hour  Intake --  Output 1450 ml  Net -1450 ml   Filed Weights   05/30/20 0600 05/31/20 0610 06/02/20 0500  Weight: 101.4 kg 101.5 kg 106.5 kg   Weight change:  Body mass index is 35.7 kg/m.   Physical Exam: General exam: Pleasant, not in distress Skin: No rashes, lesions or ulcers. HEENT: Atraumatic, normocephalic, no obvious bleeding Lungs: Clear to auscultate bilaterally.  Right chest wall with dressed wound CVS: Regular rate and rhythm, no murmur GI/Abd soft, nontender, nondistended, bowel sound present CNS: Alert, awake, oriented x3 Psychiatry: Mood appropriate Extremities: No pedal edema, no calf redness  Data Review: I have personally reviewed the laboratory data and studies available.  Recent Labs  Lab 05/31/20 1801 06/01/20 0238 06/02/20 0442 06/05/20 0938  WBC 5.4 4.8 5.8 14.0*  NEUTROABS 3.8 2.9 3.8 12.4*  HGB 8.9* 8.7* 8.6*  9.5*  HCT 27.8* 28.7* 28.3* 29.4*  MCV 90.0 92.3 92.8 89.6  PLT 196 189 181 209   Recent Labs  Lab 05/31/20 1801 06/01/20 0238 06/02/20 0442 06/05/20 0938  NA 135 135 134* 133*  K 4.3 4.6 4.5 4.2  CL 98 99 98 97*  CO2 28 28 26 27   GLUCOSE 329* 271* 350* 220*  BUN 17 19 19 16   CREATININE 0.66 0.63 0.67 0.60  CALCIUM 8.6* 8.7* 8.6* 8.5*  MG 1.7 1.6* 1.7 1.6*  PHOS 3.6 4.0 4.1  --     F/u labs ordered  Signed, , MD [facility] 06/06/2020

## 2020-06-07 LAB — GLUCOSE, CAPILLARY
Glucose-Capillary: 84 mg/dL (ref 70–99)
Glucose-Capillary: 141 mg/dL — ABNORMAL HIGH (ref 70–99)
Glucose-Capillary: 163 mg/dL — ABNORMAL HIGH (ref 70–99)
Glucose-Capillary: 125 mg/dL — ABNORMAL HIGH (ref 70–99)

## 2020-06-07 NOTE — Plan of Care (Signed)

## 2020-06-07 NOTE — Progress Notes (Signed)
PROGRESS NOTE  Annette [name]  DOB: 26-Nov-1960  PCP: Patient, No Pcp Per [facility]UD:[id number]  DOA: 05/07/2020  LOS: 31 days   Chief complaint: Cough, shortness of breath  Brief narrative: Annette [name] is a 59 y.o. F with obesity, DM, and COPD not on home O2 who presented to OSH with cough and SOB. At OSH [facility], she was found to have MRSA pneumonia of the right upper lobe, as well as a right lower lobe subsegmental PE.  No need she was treated with linezolid and Lovenox and unfortunately developed a right hydropneumothorax.  A chest tube was placed and she was transferred for CT surgery consultation.  11/23: Transferred from OSH, CT chest showed moderately large right pneumothorax, subcutaneous emphysema, second chest tube placed 11/26: Complicated by hemothorax, acute blood loss anemia 11/28: To OR for evacuation of hematoma, mechanical pleurodesis and wound VAC placement 12/3 Right chest wall wound closure and removal of wound VAC 12/6: Chest tubes removed 12/7 Acute worsening subcutaneous emphysema, including into the face, chest tube replaced 12/9: Second right-sided chest tube placed to persistent right pneumothorax 12/15: Large bore chest tube removed, one CT left in place 12/17: Drainage from a right lateral chest incision; this was opened by CT surgery, culture growing Proteus, second wound VAC placed 12/20: Wound vac removed at bedside, reapplied 12/21: CT surgery removed last chest tube  Patient's hospitalization is now prolonged because of need of skilled wound care.  CT surgery wants to continue it in the hospital for now  Subjective: Patient was seen and examined this morning. Frustrated because she could not be discharged yesterday and may have to spend Christmas in the hospital.    Assessment/Plan: Complicated pneumothorax on the right Superficial wound infection due to Proteus mirabilis -Chest tube removed.  Wound care per CT surgery guidance. -The  current (2nd) wound vac was placed due to drainage from an incision.  Culture of this drainage grew pan-sensitive Proteus. Currently on oral Keflex plan till 12/26. -Defer wound VAC and chest tube management to CT surgery.  Per CT surgery, patient need to continue skilled wound care in the hospital for now.  She may also needed to be open further to adequately pack it.   Pulmonary embolism, acute -This was diagnosed in outside hospital prior to transfer here.  She was started on anticoagulation which was later held for hemothorax. -Repeat CT chest on 12/17 showed improvement in her nonocclusive, resolving PE. -Eliquis resumed.  MRSA pneumonia Resolved  Diabetes, with hyperglycemia -A1c 10.7 on 11/24. -Currently on Lantus twice daily -20 units and 25 units, premeal sliding scale insulin and glipizide 5 mg twice daily Recent Labs  Lab 06/06/20 0731 06/06/20 1145 06/06/20 1513 06/06/20 2126 06/07/20 0718  GLUCAP 135* 123* 108* 156* 84   Hyperlipidemia -Continue atorvastatin, fibrate  COPD -No evidence of exacerbation -Continue LAMA.  Acute blood loss anemia -Hemoglobin stable. Recent Labs    05/25/20 2320 05/26/20 0219 05/27/20 0557 05/28/20 0925 05/29/20 0145 05/30/20 0038 05/31/20 1801 06/01/20 0238 06/02/20 0442 06/05/20 0938  HGB 8.4* 8.2* 8.6* 9.4* 8.6* 8.9* 8.9* 8.7* 8.6* 9.5*   Stage II coccyx pressure injury - POA -continue wound care.  Mobility: Encourage ambulation Code Status:   Code Status: Partial Code  Nutritional status: Body mass index is 35.06 kg/m. Nutrition Problem: Increased nutrient needs Etiology: wound healing,acute illness (recent COVID PNA) Signs/Symptoms: estimated needs Diet Order            Diet Carb Modified Fluid consistency: Thin; Room service  appropriate? Yes  Diet effective now           Diet - low sodium heart healthy                 DVT prophylaxis: SCD's Start: 05/12/20 1735 apixaban (ELIQUIS) tablet 10 mg   apixaban (ELIQUIS) tablet 5 mg   Antimicrobials:  Keflex Fluid: None Consultants: CT surgery Family Communication:  Discussed with patient's daughter in law on the phone on 12/23  Status is: Inpatient  Remains inpatient appropriate because:Pending dressing changes with instructions by CT surgery tomorrow.  Dispo: The patient is from: Home              Anticipated d/c is to: Home              Anticipated d/c date is: Unclear at this time              Patient currently is not medically stable to d/c.   Infusions:    Scheduled Meds: . apixaban  10 mg Oral BID   Followed by  . [START ON 06/11/2020] apixaban  5 mg Oral BID  . atorvastatin  40 mg Oral Daily  . bisacodyl  10 mg Oral Daily  . cephALEXin  500 mg Oral Q8H  . Chlorhexidine Gluconate Cloth  6 each Topical Daily  . cholecalciferol  1,000 Units Oral Daily  . clonazePAM  0.5 mg Oral BID  . fenofibrate  160 mg Oral Daily  . Gerhardt's butt cream   Topical BID  . glipiZIDE  5 mg Oral BID AC  . guaiFENesin  600 mg Oral BID  . insulin aspart  0-20 Units Subcutaneous TID WC  . insulin aspart  0-5 Units Subcutaneous QHS  . insulin aspart  8 Units Subcutaneous TID WC  . insulin glargine  20 Units Subcutaneous Daily  . insulin glargine  25 Units Subcutaneous QHS  . lidocaine  1 patch Transdermal Q24H  . multivitamin with minerals  1 tablet Oral Daily  . nystatin   Topical BID  . pregabalin  150 mg Oral TID  . senna-docusate  1 tablet Oral QHS  . sodium chloride flush  10-40 mL Intracatheter Q12H  . umeclidinium bromide  1 puff Inhalation Daily    Antimicrobials: Anti-infectives (From admission, onward)   Start     Dose/Rate Route Frequency Ordered Stop   06/04/20 0600  cephALEXin (KEFLEX) capsule 500 mg        500 mg Oral Every 8 hours 06/03/20 1431 06/09/20 1317   06/02/20 1000  cefTRIAXone (ROCEPHIN) 2 g in sodium chloride 0.9 % 100 mL IVPB  Status:  Discontinued        2 g 200 mL/hr over 30 Minutes Intravenous  Daily 06/02/20 0734 06/03/20 1431   05/17/20 1345  ceFAZolin (ANCEF) IVPB 2g/100 mL premix  Status:  Discontinued        2 g 200 mL/hr over 30 Minutes Intravenous On call 05/17/20 1335 05/18/20 1057   05/17/20 1315  ceFAZolin (ANCEF) powder 1 g  Status:  Discontinued        1 g Other To Surgery 05/17/20 1306 05/17/20 1336   05/10/20 1300  fluconazole (DIFLUCAN) tablet 150 mg        150 mg Oral every 72 hours 05/10/20 1151 05/13/20 1408   05/10/20 0900  Ampicillin-Sulbactam (UNASYN) 3 g in sodium chloride 0.9 % 100 mL IVPB        3 g 200 mL/hr over 30 Minutes Intravenous Every  6 hours 05/10/20 0753 05/17/20 0859   05/08/20 0030  linezolid (ZYVOX) tablet 600 mg  Status:  Discontinued        600 mg Oral Every 12 hours 05/08/20 0015 05/14/20 1113      PRN meds: acetaminophen, influenza vac split quadrivalent PF, levalbuterol, magnesium citrate, ondansetron (ZOFRAN) IV, oxyCODONE, pneumococcal 23 valent vaccine, polyethylene glycol, sodium chloride flush, traMADol   Objective: Vitals:   06/06/20 2123 06/07/20 0524  BP: 119/67 92/67  Pulse: 90 82  Resp: 18 17  Temp: 98.9 F (37.2 C) 98.3 F (36.8 C)  SpO2: 98% 97%    Intake/Output Summary (Last 24 hours) at 06/07/2020 0957 Last data filed at 06/07/2020 0500 Gross per 24 hour  Intake 240 ml  Output 1300 ml  Net -1060 ml   Filed Weights   05/31/20 0610 06/02/20 0500 06/07/20 0500  Weight: 101.5 kg 106.5 kg 104.6 kg   Weight change:  Body mass index is 35.06 kg/m.   Physical Exam: General exam: Pleasant, not in distress Skin: No rashes, lesions or ulcers. HEENT: Atraumatic, normocephalic, no obvious bleeding Lungs: Clear to auscultate bilaterally.  Right chest wall with dressed wound. Care per CT surgery CVS: Regular rate and rhythm, no murmur GI/Abd soft, nontender, nondistended, bowel sound present CNS: Alert, awake, oriented x3 Psychiatry: frustrated  Extremities: No pedal edema, no calf redness  Data Review: I have  personally reviewed the laboratory data and studies available.  Recent Labs  Lab 05/31/20 1801 06/01/20 0238 06/02/20 0442 06/05/20 0938  WBC 5.4 4.8 5.8 14.0*  NEUTROABS 3.8 2.9 3.8 12.4*  HGB 8.9* 8.7* 8.6* 9.5*  HCT 27.8* 28.7* 28.3* 29.4*  MCV 90.0 92.3 92.8 89.6  PLT 196 189 181 209   Recent Labs  Lab 05/31/20 1801 06/01/20 0238 06/02/20 0442 06/05/20 0938  NA 135 135 134* 133*  K 4.3 4.6 4.5 4.2  CL 98 99 98 97*  CO2 28 28 26 27   GLUCOSE 329* 271* 350* 220*  BUN 17 19 19 16   CREATININE 0.66 0.63 0.67 0.60  CALCIUM 8.6* 8.7* 8.6* 8.5*  MG 1.7 1.6* 1.7 1.6*  PHOS 3.6 4.0 4.1  --     F/u labs ordered  Signed, , MD [facility] 06/07/2020

## 2020-06-07 NOTE — Progress Notes (Addendum)
PT Cancellation Note  Patient Details Name: Annette [name] MRN: [id number] DOB: Sep 03, 1960   Cancelled Treatment:    Reason Eval/Treat Not Completed: Patient declined, no reason specified (pt reports on the phone with insurance co and denied participation at this time)   Attempted a 2nd time after pt off the phone and pt stated she would not participate in therapy today. "I'm mad I'm still here and I want to be left alone"   Ritika Hellickson B Izaiah Tabb 06/07/2020, 10:37 AM  Merryl Hacker, PT Acute Rehabilitation Services Pager: [phone] Office: [phone]

## 2020-06-07 NOTE — Progress Notes (Signed)
OT Cancellation Note  Patient Details Name: Annette [name] MRN: [id number] DOB: 1960-10-13   Cancelled Treatment:    Reason Eval/Treat Not Completed: Patient declined, no reason specified. Patient does not wish to participate in therapy this date. No family present at bedside for family ed. OT will continue to follow acutely.   Kallie Edward OTR/L Supplemental OT, Department of rehab services [phone]  Charliene Inoue R H. 06/07/2020, 1:45 PM

## 2020-06-08 LAB — CBC WITH DIFFERENTIAL/PLATELET
Abs Immature Granulocytes: 0.03 10*3/uL (ref 0.00–0.07)
Basophils Absolute: 0 10*3/uL (ref 0.0–0.1)
Basophils Relative: 1 %
Eosinophils Absolute: 0.2 10*3/uL (ref 0.0–0.5)
Eosinophils Relative: 3 %
HCT: 27.7 % — ABNORMAL LOW (ref 36.0–46.0)
Hemoglobin: 8.3 g/dL — ABNORMAL LOW (ref 12.0–15.0)
Immature Granulocytes: 0 %
Lymphocytes Relative: 22 %
Lymphs Abs: 1.5 10*3/uL (ref 0.7–4.0)
MCH: 27.5 pg (ref 26.0–34.0)
MCHC: 30 g/dL (ref 30.0–36.0)
MCV: 91.7 fL (ref 80.0–100.0)
Monocytes Absolute: 0.5 10*3/uL (ref 0.1–1.0)
Monocytes Relative: 8 %
Neutro Abs: 4.4 10*3/uL (ref 1.7–7.7)
Neutrophils Relative %: 66 %
Platelets: 184 10*3/uL (ref 150–400)
RBC: 3.02 MIL/uL — ABNORMAL LOW (ref 3.87–5.11)
RDW: 15.9 % — ABNORMAL HIGH (ref 11.5–15.5)
WBC: 6.7 10*3/uL (ref 4.0–10.5)
nRBC: 0 % (ref 0.0–0.2)

## 2020-06-08 LAB — GLUCOSE, CAPILLARY
Glucose-Capillary: 170 mg/dL — ABNORMAL HIGH (ref 70–99)
Glucose-Capillary: 114 mg/dL — ABNORMAL HIGH (ref 70–99)
Glucose-Capillary: 123 mg/dL — ABNORMAL HIGH (ref 70–99)
Glucose-Capillary: 127 mg/dL — ABNORMAL HIGH (ref 70–99)

## 2020-06-08 LAB — BASIC METABOLIC PANEL
Anion gap: 6 (ref 5–15)
BUN: 21 mg/dL — ABNORMAL HIGH (ref 6–20)
CO2: 30 mmol/L (ref 22–32)
Calcium: 8.6 mg/dL — ABNORMAL LOW (ref 8.9–10.3)
Chloride: 99 mmol/L (ref 98–111)
Creatinine, Ser: 0.78 mg/dL (ref 0.44–1.00)
GFR, Estimated: >60 mL/min (ref >60)
Glucose, Bld: 151 mg/dL — ABNORMAL HIGH (ref 70–99)
Potassium: 4.4 mmol/L (ref 3.5–5.1)
Sodium: 135 mmol/L (ref 135–145)

## 2020-06-08 LAB — MAGNESIUM: Magnesium: 2 mg/dL (ref 1.7–2.4)

## 2020-06-08 NOTE — Plan of Care (Signed)

## 2020-06-08 NOTE — Progress Notes (Signed)
PROGRESS NOTE  Annette [name]  DOB: [birth year]  PCP: Patient, No Pcp Per [facility]:[id number]  DOA: 05/07/2020  LOS: 32 days   Chief complaint: Cough, shortness of breath  Brief narrative: Annette [name] is a 59 y.o. F with obesity, DM, and COPD not on home O2 who presented to OSH with cough and SOB. At OSH [facility], she was found to have MRSA pneumonia of the right upper lobe, as well as a right lower lobe subsegmental PE.  No need she was treated with linezolid and Lovenox and unfortunately developed a right hydropneumothorax.  A chest tube was placed and she was transferred for CT surgery consultation.  11/23: Transferred from OSH, CT chest showed moderately large right pneumothorax, subcutaneous emphysema, second chest tube placed 11/26: Complicated by hemothorax, acute blood loss anemia 11/28: To OR for evacuation of hematoma, mechanical pleurodesis and wound VAC placement 12/3 Right chest wall wound closure and removal of wound VAC 12/6: Chest tubes removed 12/7 Acute worsening subcutaneous emphysema, including into the face, chest tube replaced 12/9: Second right-sided chest tube placed to persistent right pneumothorax 12/15: Large bore chest tube removed, one CT left in place 12/17: Drainage from a right lateral chest incision; this was opened by CT surgery, culture growing Proteus, second wound VAC placed 12/20: Wound vac removed at bedside, reapplied 12/21: CT surgery removed last chest tube  Patient's hospitalization is now prolonged because of need of skilled wound care.  CT surgery wants to continue it in the hospital for now  Subjective: Patient was seen and examined this morning. Feels okay.  She was frustrated yesterday because of not being able to go home.  She understands now and states 'she is in peace.'.  No new problems overnight.  Assessment/Plan: Complicated pneumothorax on the right Superficial wound infection due to Proteus mirabilis -Chest tube  removed.  Wound care per CT surgery guidance. -The current (2nd) wound vac was placed due to drainage from an incision.  Culture of this drainage grew pan-sensitive Proteus. Currently on oral Keflex plan till 12/26. -Defer wound VAC and chest tube management to CT surgery. Per CT surgery, patient needs to continue skilled wound care in the hospital for now.  She may also needed to be open further to adequately pack it.   Pulmonary embolism, acute -This was diagnosed in outside hospital prior to transfer here. She was started on anticoagulation which was later held for hemothorax. -Repeat CT chest on 12/17 showed improvement in her nonocclusive, resolving PE. -Eliquis resumed.  MRSA pneumonia Resolved  Diabetes, with hyperglycemia -A1c 10.7 on 11/24. -Currently on Lantus twice daily -20 units and 25 units, premeal sliding scale insulin and glipizide 5 mg twice daily.  Blood sugar level consistently less than 200.  Continue the same. Recent Labs  Lab 06/07/20 0718 06/07/20 1201 06/07/20 1527 06/07/20 2137 06/08/20 0732  GLUCAP 84 141* 163* 125* 170*   Hyperlipidemia -Continue atorvastatin, fibrate  COPD -No evidence of exacerbation -Continue bronchodilators.  Acute blood loss anemia -Hemoglobin stable between 8 and 9. Recent Labs    05/26/20 0219 05/27/20 0557 05/28/20 0925 05/29/20 0145 05/30/20 0038 05/31/20 1801 06/01/20 0238 06/02/20 0442 06/05/20 0938 06/08/20 0234  HGB 8.2* 8.6* 9.4* 8.6* 8.9* 8.9* 8.7* 8.6* 9.5* 8.3*   Stage II coccyx pressure injury - POA -continue wound care.  Mobility: Encourage ambulation with PT Code Status:   Code Status: Partial Code  Nutritional status: Body mass index is 35.06 kg/m. Nutrition Problem: Increased nutrient needs Etiology: wound healing,acute illness (  recent COVID PNA) Signs/Symptoms: estimated needs Diet Order            Diet Carb Modified Fluid consistency: Thin; Room service appropriate? Yes  Diet  effective now           Diet - low sodium heart healthy                 DVT prophylaxis: SCD's Start: 05/12/20 1735 apixaban (ELIQUIS) tablet 10 mg  apixaban (ELIQUIS) tablet 5 mg   Antimicrobials:  Keflex Fluid: None Consultants: Cardiothoracic surgery Family Communication:  Discussed with patient's daughter in law on the phone on 12/23  Status is: Inpatient  Remains inpatient appropriate because:Pending dressing changes with instructions by CT surgery tomorrow.  Dispo: The patient is from: Home              Anticipated d/c is to: Home              Anticipated d/c date is: Unclear at this time              Patient currently is not medically stable to d/c.   Infusions:    Scheduled Meds: . apixaban  10 mg Oral BID   Followed by  . [START ON 06/11/2020] apixaban  5 mg Oral BID  . atorvastatin  40 mg Oral Daily  . bisacodyl  10 mg Oral Daily  . cephALEXin  500 mg Oral Q8H  . Chlorhexidine Gluconate Cloth  6 each Topical Daily  . cholecalciferol  1,000 Units Oral Daily  . clonazePAM  0.5 mg Oral BID  . fenofibrate  160 mg Oral Daily  . Gerhardt's butt cream   Topical BID  . glipiZIDE  5 mg Oral BID AC  . guaiFENesin  600 mg Oral BID  . insulin aspart  0-20 Units Subcutaneous TID WC  . insulin aspart  0-5 Units Subcutaneous QHS  . insulin aspart  8 Units Subcutaneous TID WC  . insulin glargine  20 Units Subcutaneous Daily  . insulin glargine  25 Units Subcutaneous QHS  . lidocaine  1 patch Transdermal Q24H  . multivitamin with minerals  1 tablet Oral Daily  . nystatin   Topical BID  . pregabalin  150 mg Oral TID  . senna-docusate  1 tablet Oral QHS  . sodium chloride flush  10-40 mL Intracatheter Q12H  . umeclidinium bromide  1 puff Inhalation Daily    Antimicrobials: Anti-infectives (From admission, onward)   Start     Dose/Rate Route Frequency Ordered Stop   06/04/20 0600  cephALEXin (KEFLEX) capsule 500 mg        500 mg Oral Every 8 hours 06/03/20 1431  06/09/20 1317   06/02/20 1000  cefTRIAXone (ROCEPHIN) 2 g in sodium chloride 0.9 % 100 mL IVPB  Status:  Discontinued        2 g 200 mL/hr over 30 Minutes Intravenous Daily 06/02/20 0734 06/03/20 1431   05/17/20 1345  ceFAZolin (ANCEF) IVPB 2g/100 mL premix  Status:  Discontinued        2 g 200 mL/hr over 30 Minutes Intravenous On call 05/17/20 1335 05/18/20 1057   05/17/20 1315  ceFAZolin (ANCEF) powder 1 g  Status:  Discontinued        1 g Other To Surgery 05/17/20 1306 05/17/20 1336   05/10/20 1300  fluconazole (DIFLUCAN) tablet 150 mg        150 mg Oral every 72 hours 05/10/20 1151 05/13/20 1408   05/10/20 0900  Ampicillin-Sulbactam (  UNASYN) 3 g in sodium chloride 0.9 % 100 mL IVPB        3 g 200 mL/hr over 30 Minutes Intravenous Every 6 hours 05/10/20 0753 05/17/20 0859   05/08/20 0030  linezolid (ZYVOX) tablet 600 mg  Status:  Discontinued        600 mg Oral Every 12 hours 05/08/20 0015 05/14/20 1113      PRN meds: acetaminophen, influenza vac split quadrivalent PF, levalbuterol, magnesium citrate, ondansetron (ZOFRAN) IV, oxyCODONE, pneumococcal 23 valent vaccine, polyethylene glycol, sodium chloride flush, traMADol   Objective: Vitals:   06/08/20 0601 06/08/20 0734  BP: 98/72   Pulse: (!) 106 75  Resp: 20 18  Temp: 98.7 F (37.1 C)   SpO2: 95% 90%    Intake/Output Summary (Last 24 hours) at 06/08/2020 1028 Last data filed at 06/08/2020 0603 Gross per 24 hour  Intake --  Output 1800 ml  Net -1800 ml   Filed Weights   05/31/20 0610 06/02/20 0500 06/07/20 0500  Weight: 101.5 kg 106.5 kg 104.6 kg   Weight change:  Body mass index is 35.06 kg/m.   Physical Exam: General exam: Pleasant, not in distress.  On low-flow oxygen by nasal cannula Skin: No rashes, lesions or ulcers. HEENT: Atraumatic, normocephalic, no obvious bleeding Lungs: Clear to auscultate bilaterally.  Right chest wall with dressed wound.  Wound care per CT surgery CVS: Regular rate and rhythm,  no murmur GI/Abd soft, nontender, nondistended, bowel sound present CNS: Alert, awake, oriented x3 Psychiatry: Mood appropriate Extremities: No pedal edema, no calf redness  Data Review: I have personally reviewed the laboratory data and studies available.  Recent Labs  Lab 06/02/20 0442 06/05/20 0938 06/08/20 0234  WBC 5.8 14.0* 6.7  NEUTROABS 3.8 12.4* 4.4  HGB 8.6* 9.5* 8.3*  HCT 28.3* 29.4* 27.7*  MCV 92.8 89.6 91.7  PLT 181 209 184   Recent Labs  Lab 06/02/20 0442 06/05/20 0938 06/08/20 0234  NA 134* 133* 135  K 4.5 4.2 4.4  CL 98 97* 99  CO2 26 27 30   GLUCOSE 350* 220* 151*  BUN 19 16 21*  CREATININE 0.67 0.60 0.78  CALCIUM 8.6* 8.5* 8.6*  MG 1.7 1.6* 2.0  PHOS 4.1  --   --     F/u labs ordered  Signed, , MD [facility] 06/08/2020

## 2020-06-08 NOTE — Progress Notes (Addendum)
[address]       [place] [address]             [phone]      22 Days Post-Op Procedure(s) (LRB): RIGHT CHEST WALL WOUND CLOSURE REMOVAL OF WOUND VAC (Right) Subjective:  No new concerns, maintaining adequate O2 saturations on O2 at 2L/min.    Objective: Vital signs in last 24 hours: Temp:  [98.7 F (37.1 C)-99.2 F (37.3 C)] 98.7 F (37.1 C) (12/25 0601) Pulse Rate:  [75-110] 75 (12/25 0734) Resp:  [18-20] 18 (12/25 0734) BP: (91-113)/(68-72) 98/72 (12/25 0601) SpO2:  [90 %-96 %] 90 % (12/25 0734)     Intake/Output from previous day: 12/24 0701 - 12/25 0700 In: -  Out: 1800 [Urine:1800] Intake/Output this shift: No intake/output data recorded.  General appearance: alert, cooperative and no distress Wound: Wound vac changed at the bedside yesterday evening. VAC in place and functioning appropriately. Minimal drainage. The wound at the skin level measures only about 2 - 2.5cm in diameter but the wound opens into a much larger undermined cavity that is difficult to measure accurately.    Lab Results: Recent Labs    06/08/20 0234  WBC 6.7  HGB 8.3*  HCT 27.7*  PLT 184   BMET:  Recent Labs    06/08/20 0234  NA 135  K 4.4  CL 99  CO2 30  GLUCOSE 151*  BUN 21*  CREATININE 0.78  CALCIUM 8.6*    PT/INR: No results for input(s): LABPROT, INR in the last 72 hours. ABG    Component Value Date/Time   PHART 7.400 05/12/2020 1524   HCO3 36.1 (H) 05/12/2020 1524   TCO2 38 (H) 05/12/2020 1524   O2SAT 99.0 05/12/2020 1524   CBG (last 3)  Recent Labs    06/07/20 1527 06/07/20 2137 06/08/20 0732  GLUCAP 163* 125* 170*    Meds Scheduled Meds: . apixaban  10 mg Oral BID   Followed by  . [START ON 06/11/2020] apixaban  5 mg Oral BID  . atorvastatin  40 mg Oral Daily  . bisacodyl  10 mg Oral Daily  . cephALEXin  500 mg Oral Q8H  . Chlorhexidine Gluconate Cloth  6 each Topical Daily  . cholecalciferol  1,000 Units Oral  Daily  . clonazePAM  0.5 mg Oral BID  . fenofibrate  160 mg Oral Daily  . Gerhardt's butt cream   Topical BID  . glipiZIDE  5 mg Oral BID AC  . guaiFENesin  600 mg Oral BID  . insulin aspart  0-20 Units Subcutaneous TID WC  . insulin aspart  0-5 Units Subcutaneous QHS  . insulin aspart  8 Units Subcutaneous TID WC  . insulin glargine  20 Units Subcutaneous Daily  . insulin glargine  25 Units Subcutaneous QHS  . lidocaine  1 patch Transdermal Q24H  . multivitamin with minerals  1 tablet Oral Daily  . nystatin   Topical BID  . pregabalin  150 mg Oral TID  . senna-docusate  1 tablet Oral QHS  . sodium chloride flush  10-40 mL Intracatheter Q12H  . umeclidinium bromide  1 puff Inhalation Daily   Continuous Infusions:  PRN Meds:.acetaminophen, influenza vac split quadrivalent PF, levalbuterol, magnesium citrate, ondansetron (ZOFRAN) IV, oxyCODONE, pneumococcal 23 valent vaccine, polyethylene glycol, sodium chloride flush, traMADol    Assessment/Plan: S/P Procedure(s) (LRB): RIGHT CHEST WALL WOUND CLOSURE REMOVAL OF WOUND VAC (Right)  -Open wound left chest- wound vac last changed  at the bedside last evening.  The wound is ~2x2 cm at the surface but opens into a larger undermined cavity. The tissues are clean and dry.  Cx obtained 05/31/20 grew Proteus, on oral Keflex.  I am concerned that this wound will be difficult to manage at home without home health nursing. It may actually need to be opened further to adequately pack it because the opening through the skin is constricting faster than the deep cavity. I explained to Annette [name] that we will need to continue wound care here in the hospital for now.  She reluctantly agrees.    LOS: 32 days    Leary Roca PA-C [phone] 06/08/2020  I agree. Will need to get Dr. Vickey Sages to evaluate on Monday to decide if further debridement needed.

## 2020-06-09 LAB — GLUCOSE, CAPILLARY
Glucose-Capillary: 204 mg/dL — ABNORMAL HIGH (ref 70–99)
Glucose-Capillary: 139 mg/dL — ABNORMAL HIGH (ref 70–99)
Glucose-Capillary: 199 mg/dL — ABNORMAL HIGH (ref 70–99)
Glucose-Capillary: 174 mg/dL — ABNORMAL HIGH (ref 70–99)

## 2020-06-09 MED ORDER — GLIPIZIDE 5 MG PO TABS
10.0000 mg | ORAL_TABLET | Freq: Two times a day (BID) | ORAL | Status: DC
  Administered 2020-06-09 – 2020-06-10 (×2): 10 mg via ORAL
  Filled 2020-06-09 (×2): qty 2

## 2020-06-09 NOTE — Progress Notes (Signed)
PROGRESS NOTE  Annette [name]  DOB: [birth year]  PCP: Patient, No Pcp Per [facility]:[id number]  DOA: 05/07/2020  LOS: 33 days   Chief complaint: Cough, shortness of breath  Brief narrative: Annette [name] is a 59 y.o. F with obesity, DM, and COPD not on home O2 who presented to OSH with cough and SOB. At OSH Annette [name], she was found to have MRSA pneumonia of the right upper lobe, as well as a right lower lobe subsegmental PE.  No need she was treated with linezolid and Lovenox and unfortunately developed a right hydropneumothorax.  A chest tube was placed and she was transferred for CT surgery consultation.  11/23: Transferred from OSH, CT chest showed moderately large right pneumothorax, subcutaneous emphysema, second chest tube placed 11/26: Complicated by hemothorax, acute blood loss anemia 11/28: To OR for evacuation of hematoma, mechanical pleurodesis and wound VAC placement 12/3 Right chest wall wound closure and removal of wound VAC 12/6: Chest tubes removed 12/7 Acute worsening subcutaneous emphysema, including into the face, chest tube replaced 12/9: Second right-sided chest tube placed to persistent right pneumothorax 12/15: Large bore chest tube removed, one CT left in place 12/17: Drainage from a right lateral chest incision; this was opened by CT surgery, culture growing Proteus, second wound VAC placed 12/20: Wound vac removed at bedside, reapplied 12/21: CT surgery removed last chest tube  Patient's hospitalization is now prolonged because of need of skilled wound care.  CT surgery wants to continue it in the hospital for now  Subjective: Patient was seen and examined this morning. Lying down in bed.  Not in distress.  On low-flow oxygen.  No blood work today. Ongoing wound care of the chest wall by CT surgery.  Assessment/Plan: Complicated pneumothorax on the right Superficial wound infection due to Proteus mirabilis -Chest tube removed.  Wound care per CT  surgery guidance. -The current (2nd) wound vac was placed due to drainage from an incision.  Culture of this drainage grew pan-sensitive Proteus. Currently on oral Keflex plan till 12/26. -Defer wound VAC and chest tube management to CT surgery. Per CT surgery, patient needs to continue skilled wound care in the hospital for now.  She may also needed to be open further to adequately pack it.   Pulmonary embolism, acute -This was diagnosed in outside hospital prior to transfer here. She was started on anticoagulation which was later held for hemothorax. -Repeat CT chest on 12/17 showed improvement in her nonocclusive, resolving PE. -Eliquis resumed.  MRSA pneumonia Resolved  Diabetes, with hyperglycemia -A1c 10.7 on 11/24. -Prior to admission patient was not glipizide 10 mg twice daily -Currently on Lantus twice daily -20 units and 25 units, premeal sliding scale insulin and glipizide 5 mg twice daily.  Blood sugar level level is 204 this morning. -I will increase glipizide to her home dose of 10 mg twice daily. Recent Labs  Lab 06/08/20 0732 06/08/20 1149 06/08/20 1613 06/08/20 2054 06/09/20 0852  GLUCAP 170* 114* 123* 127* 204*   Hyperlipidemia -Continue atorvastatin, fibrate  COPD -No evidence of exacerbation -Continue bronchodilators.  Acute blood loss anemia -Hemoglobin stable between 8 and 9. Recent Labs    05/26/20 0219 05/27/20 0557 05/28/20 0925 05/29/20 0145 05/30/20 0038 05/31/20 1801 06/01/20 0238 06/02/20 0442 06/05/20 0938 06/08/20 0234  HGB 8.2* 8.6* 9.4* 8.6* 8.9* 8.9* 8.7* 8.6* 9.5* 8.3*   Stage II coccyx pressure injury - POA -continue wound care.  Mobility: Encourage ambulation with PT Code Status:   Code Status: Partial Code  Nutritional status: Body mass index is 35.36 kg/m. Nutrition Problem: Increased nutrient needs Etiology: wound healing,acute illness (recent COVID PNA) Signs/Symptoms: estimated needs Diet Order             Diet Carb Modified Fluid consistency: Thin; Room service appropriate? Yes  Diet effective now           Diet - low sodium heart healthy                 DVT prophylaxis: SCD's Start: 05/12/20 1735 apixaban (ELIQUIS) tablet 10 mg  apixaban (ELIQUIS) tablet 5 mg   Antimicrobials:  Keflex to complete today 12/26 Fluid: None Consultants: Cardiothoracic surgery Family Communication:  Daughter-in-law updated  Status is: Inpatient  Remains inpatient appropriate because:Pending dressing changes with instructions by CT surgery tomorrow.  Dispo: The patient is from: Home              Anticipated d/c is to: Home              Anticipated d/c date is: Unclear at this time              Patient currently is not medically stable to d/c.   Infusions:    Scheduled Meds: . apixaban  10 mg Oral BID   Followed by  . [START ON 06/11/2020] apixaban  5 mg Oral BID  . atorvastatin  40 mg Oral Daily  . bisacodyl  10 mg Oral Daily  . Chlorhexidine Gluconate Cloth  6 each Topical Daily  . cholecalciferol  1,000 Units Oral Daily  . clonazePAM  0.5 mg Oral BID  . fenofibrate  160 mg Oral Daily  . Gerhardt's butt cream   Topical BID  . glipiZIDE  5 mg Oral BID AC  . guaiFENesin  600 mg Oral BID  . insulin aspart  0-20 Units Subcutaneous TID WC  . insulin aspart  0-5 Units Subcutaneous QHS  . insulin aspart  8 Units Subcutaneous TID WC  . insulin glargine  20 Units Subcutaneous Daily  . insulin glargine  25 Units Subcutaneous QHS  . lidocaine  1 patch Transdermal Q24H  . multivitamin with minerals  1 tablet Oral Daily  . nystatin   Topical BID  . pregabalin  150 mg Oral TID  . senna-docusate  1 tablet Oral QHS  . sodium chloride flush  10-40 mL Intracatheter Q12H  . umeclidinium bromide  1 puff Inhalation Daily    Antimicrobials: Anti-infectives (From admission, onward)   Start     Dose/Rate Route Frequency Ordered Stop   06/04/20 0600  cephALEXin (KEFLEX) capsule 500 mg        500 mg  Oral Every 8 hours 06/03/20 1431 06/09/20 0547   06/02/20 1000  cefTRIAXone (ROCEPHIN) 2 g in sodium chloride 0.9 % 100 mL IVPB  Status:  Discontinued        2 g 200 mL/hr over 30 Minutes Intravenous Daily 06/02/20 0734 06/03/20 1431   05/17/20 1345  ceFAZolin (ANCEF) IVPB 2g/100 mL premix  Status:  Discontinued        2 g 200 mL/hr over 30 Minutes Intravenous On call 05/17/20 1335 05/18/20 1057   05/17/20 1315  ceFAZolin (ANCEF) powder 1 g  Status:  Discontinued        1 g Other To Surgery 05/17/20 1306 05/17/20 1336   05/10/20 1300  fluconazole (DIFLUCAN) tablet 150 mg        150 mg Oral every 72 hours 05/10/20 1151 05/13/20 1408  05/10/20 0900  Ampicillin-Sulbactam (UNASYN) 3 g in sodium chloride 0.9 % 100 mL IVPB        3 g 200 mL/hr over 30 Minutes Intravenous Every 6 hours 05/10/20 0753 05/17/20 0859   05/08/20 0030  linezolid (ZYVOX) tablet 600 mg  Status:  Discontinued        600 mg Oral Every 12 hours 05/08/20 0015 05/14/20 1113      PRN meds: acetaminophen, influenza vac split quadrivalent PF, levalbuterol, magnesium citrate, ondansetron (ZOFRAN) IV, oxyCODONE, pneumococcal 23 valent vaccine, polyethylene glycol, sodium chloride flush, traMADol   Objective: Vitals:   06/08/20 2116 06/09/20 0247  BP: 105/69 112/66  Pulse: (!) 112 97  Resp: 16 16  Temp: 98.4 F (36.9 C) 98.8 F (37.1 C)  SpO2: 96%     Intake/Output Summary (Last 24 hours) at 06/09/2020 1041 Last data filed at 06/09/2020 0800 Gross per 24 hour  Intake --  Output 1320 ml  Net -1320 ml   Filed Weights   06/02/20 0500 06/07/20 0500 06/09/20 0500  Weight: 106.5 kg 104.6 kg 105.5 kg   Weight change:  Body mass index is 35.36 kg/m.   Physical Exam: General exam: Pleasant, not in distress.  On low-flow oxygen by nasal cannula Skin: No rashes, lesions or ulcers. HEENT: Atraumatic, normocephalic, no obvious bleeding Lungs: Clear to auscultation bilaterally right chest wall with dressed wound.   Wound care per CT surgery CVS: Regular rate and rhythm, no murmur GI/Abd soft, nontender, nondistended, bowel sound present CNS: Alert, awake, oriented x3 Psychiatry: Mood appropriate Extremities: No pedal edema, no calf redness  Data Review: I have personally reviewed the laboratory data and studies available.  Recent Labs  Lab 06/05/20 0938 06/08/20 0234  WBC 14.0* 6.7  NEUTROABS 12.4* 4.4  HGB 9.5* 8.3*  HCT 29.4* 27.7*  MCV 89.6 91.7  PLT 209 184   Recent Labs  Lab 06/05/20 0938 06/08/20 0234  NA 133* 135  K 4.2 4.4  CL 97* 99  CO2 27 30  GLUCOSE 220* 151*  BUN 16 21*  CREATININE 0.60 0.78  CALCIUM 8.5* 8.6*  MG 1.6* 2.0    F/u labs ordered  Signed, Lorin Glass, MD [facility] 06/09/2020

## 2020-06-09 NOTE — Progress Notes (Addendum)
[address]       [place] [address]             [phone]      23 Days Post-Op Procedure(s) (LRB): RIGHT CHEST WALL WOUND CLOSURE REMOVAL OF WOUND VAC (Right) Subjective:  No new concerns, maintaining adequate O2 saturations on O2 at 2L/min.    Objective: Vital signs in last 24 hours: Temp:  [98.2 F (36.8 C)-98.8 F (37.1 C)] 98.8 F (37.1 C) (12/26 0247) Pulse Rate:  [97-112] 97 (12/26 0247) Resp:  [16] 16 (12/26 0247) BP: (97-112)/(65-69) 112/66 (12/26 0247) SpO2:  [96 %-97 %] 96 % (12/25 2116) Weight:  [105.5 kg] 105.5 kg (12/26 0500)     Intake/Output from previous day: 12/25 0701 - 12/26 0700 In: -  Out: 700 [Urine:700] Intake/Output this shift: Total I/O In: -  Out: 620 [Urine:620]  General appearance: alert, cooperative and no distress Wound: Wound vac functioning appropriately. Minimal drainage.    Lab Results: Recent Labs    06/08/20 0234  WBC 6.7  HGB 8.3*  HCT 27.7*  PLT 184   BMET:  Recent Labs    06/08/20 0234  NA 135  K 4.4  CL 99  CO2 30  GLUCOSE 151*  BUN 21*  CREATININE 0.78  CALCIUM 8.6*    PT/INR: No results for input(s): LABPROT, INR in the last 72 hours. ABG    Component Value Date/Time   PHART 7.400 05/12/2020 1524   HCO3 36.1 (H) 05/12/2020 1524   TCO2 38 (H) 05/12/2020 1524   O2SAT 99.0 05/12/2020 1524   CBG (last 3)  Recent Labs    06/08/20 2054 06/09/20 0852 06/09/20 1218  GLUCAP 127* 204* 139*    Meds Scheduled Meds: . apixaban  10 mg Oral BID   Followed by  . [START ON 06/11/2020] apixaban  5 mg Oral BID  . atorvastatin  40 mg Oral Daily  . bisacodyl  10 mg Oral Daily  . Chlorhexidine Gluconate Cloth  6 each Topical Daily  . cholecalciferol  1,000 Units Oral Daily  . clonazePAM  0.5 mg Oral BID  . fenofibrate  160 mg Oral Daily  . Gerhardt's butt cream   Topical BID  . glipiZIDE  10 mg Oral BID AC  . guaiFENesin  600 mg Oral BID  . insulin aspart  0-20 Units  Subcutaneous TID WC  . insulin aspart  0-5 Units Subcutaneous QHS  . insulin aspart  8 Units Subcutaneous TID WC  . insulin glargine  20 Units Subcutaneous Daily  . insulin glargine  25 Units Subcutaneous QHS  . lidocaine  1 patch Transdermal Q24H  . multivitamin with minerals  1 tablet Oral Daily  . nystatin   Topical BID  . pregabalin  150 mg Oral TID  . senna-docusate  1 tablet Oral QHS  . sodium chloride flush  10-40 mL Intracatheter Q12H  . umeclidinium bromide  1 puff Inhalation Daily   Continuous Infusions:  PRN Meds:.acetaminophen, influenza vac split quadrivalent PF, levalbuterol, magnesium citrate, ondansetron (ZOFRAN) IV, oxyCODONE, pneumococcal 23 valent vaccine, polyethylene glycol, sodium chloride flush, traMADol    Assessment/Plan: S/P Procedure(s) (LRB): RIGHT CHEST WALL WOUND CLOSURE REMOVAL OF WOUND VAC (Right)  -Open wound left chest- wound vac last changed at the bedside l2/24.  The wound is ~2x2 cm at the surface but opens into a larger undermined cavity. The tissues are clean and dry.  Cx obtained 05/31/20 grew Proteus, on oral Keflex.  I am concerned that this wound will be difficult to manage at home without home health nursing. It may actually need to be opened further to adequately pack it because the opening through the skin is constricting faster than the deep cavity. I explained to Ms. Peltz. Will plan VAC change, evaluation by Dr. Vickey Sages tomorrow.    LOS: 33 days    Leary Roca PA-C [phone] 06/09/2020  Agree with above. Will discuss with Dr. Vickey Sages.

## 2020-06-09 NOTE — Plan of Care (Signed)
  Problem: Metabolic: Goal: Ability to maintain appropriate glucose levels will improve Outcome: Progressing   

## 2020-06-10 ENCOUNTER — Ambulatory Visit: Payer: Self-pay | Admitting: Cardiothoracic Surgery

## 2020-06-10 LAB — GLUCOSE, CAPILLARY
Glucose-Capillary: 84 mg/dL (ref 70–99)
Glucose-Capillary: 103 mg/dL — ABNORMAL HIGH (ref 70–99)

## 2020-06-10 MED ORDER — INSULIN GLARGINE 100 UNIT/ML SOLOSTAR PEN: 20.0000 [IU] | PEN_INJECTOR | Freq: Two times a day (BID) | SUBCUTANEOUS | 2 refills | Status: AC

## 2020-06-10 MED ORDER — APIXABAN 5 MG PO TABS: 5.0000 mg | ORAL_TABLET | Freq: Two times a day (BID) | ORAL | 2 refills | Status: AC

## 2020-06-10 NOTE — TOC Transition Note (Signed)
Transition of Care [facility]) - CM/SW Discharge Note   Patient Details  Name: Annette [name] MRN: [id number] Date of Birth: 05-29-1961  [facility] CM/SW Contact:  Lorri Frederick, LCSW Phone Number: 06/10/2020, 1:41 PM   Clinical Narrative:   Pt discharging home.  Pt daughter in law to provide support with wound care.  Unable to arrange [facility] and pt agreeable to outpt PT, arranged at [facility] in [place], order faxed to that office.  Equipment through [employer] including O2, hospital bed, and hoyer lift.  Daughter in law to transport.  Hospital discharge appt in place.     Final next level of care: Home/Self Care Barriers to Discharge: Barriers Resolved   Patient Goals and CMS Choice   CMS Medicare.gov Compare Post Acute Care list provided to:: Patient Choice offered to / list presented to : Patient  Discharge Placement                       Discharge Plan and Services In-house Referral: Clinical Social Work Discharge Planning Services: CM Consult,DC out of service area Post Acute Care Choice: Durable Medical Equipment          DME Arranged: Hospital bed,Other see comment (hoyer lift) DME Agency: [employer] Date DME Agency Contacted: 05/30/20 Time DME Agency Contacted: [phone] Representative spoke with at DME Agency: Silvio Pate HH Arranged:  (Unable to be arranged)          Social Determinants of Health (SDOH) Interventions     Readmission Risk Interventions No flowsheet data found.

## 2020-06-10 NOTE — Progress Notes (Signed)
[address]       [place] [address]             [phone]      24 Days Post-Op Procedure(s) (LRB): RIGHT CHEST WALL WOUND CLOSURE REMOVAL OF WOUND VAC (Right) Subjective:  No new concerns, she is eager to go home.  Denies pain at the right chest wound.   Objective: Vital signs in last 24 hours: Temp:  [97.7 F (36.5 C)-97.9 F (36.6 C)] 97.9 F (36.6 C) (12/27 0530) Pulse Rate:  [75-91] 75 (12/27 0530) Resp:  [16-19] 18 (12/27 0530) BP: (96-108)/(65-69) 96/65 (12/27 0530) SpO2:  [95 %-98 %] 98 % (12/27 0530)  Intake/Output from previous day: 12/26 0701 - 12/27 0700 In: 360 [P.O.:360] Out: 2020 [Urine:2020] Intake/Output this shift: No intake/output data recorded.  General appearance: alert, cooperative and no distress Wound: Wound vac was removed and the open wound was irrigated with sterile saline. The wound was examined along with Dr. Vickey Sages. There is tunneling of the wound in an anterior direction with much less undermining than previously seen. There was no drainage, no surrounding erythema, and no significant tenderness.  The wound was packed loosely with a 3" gauze strip moistened with saline then covered with a dry dressing.   Lab Results: Recent Labs    06/08/20 0234  WBC 6.7  HGB 8.3*  HCT 27.7*  PLT 184   BMET:  Recent Labs    06/08/20 0234  NA 135  K 4.4  CL 99  CO2 30  GLUCOSE 151*  BUN 21*  CREATININE 0.78  CALCIUM 8.6*    PT/INR: No results for input(s): LABPROT, INR in the last 72 hours. ABG    Component Value Date/Time   PHART 7.400 05/12/2020 1524   HCO3 36.1 (H) 05/12/2020 1524   TCO2 38 (H) 05/12/2020 1524   O2SAT 99.0 05/12/2020 1524   CBG (last 3)  Recent Labs    06/09/20 1620 06/09/20 2032 06/10/20 0743  GLUCAP 199* 174* 84    Meds Scheduled Meds: . apixaban  10 mg Oral BID   Followed by  . [START ON 06/11/2020] apixaban  5 mg Oral BID  . atorvastatin  40 mg Oral Daily  . bisacodyl  10  mg Oral Daily  . Chlorhexidine Gluconate Cloth  6 each Topical Daily  . cholecalciferol  1,000 Units Oral Daily  . clonazePAM  0.5 mg Oral BID  . fenofibrate  160 mg Oral Daily  . Gerhardt's butt cream   Topical BID  . glipiZIDE  10 mg Oral BID AC  . guaiFENesin  600 mg Oral BID  . insulin aspart  0-20 Units Subcutaneous TID WC  . insulin aspart  0-5 Units Subcutaneous QHS  . insulin aspart  8 Units Subcutaneous TID WC  . insulin glargine  20 Units Subcutaneous Daily  . insulin glargine  25 Units Subcutaneous QHS  . lidocaine  1 patch Transdermal Q24H  . multivitamin with minerals  1 tablet Oral Daily  . nystatin   Topical BID  . pregabalin  150 mg Oral TID  . senna-docusate  1 tablet Oral QHS  . sodium chloride flush  10-40 mL Intracatheter Q12H  . umeclidinium bromide  1 puff Inhalation Daily   Continuous Infusions:  PRN Meds:.acetaminophen, influenza vac split quadrivalent PF, levalbuterol, magnesium citrate, ondansetron (ZOFRAN) IV, oxyCODONE, pneumococcal 23 valent vaccine, polyethylene glycol, sodium chloride flush, traMADol    Assessment/Plan: S/P Procedure(s) (LRB): RIGHT CHEST WALL  WOUND CLOSURE REMOVAL OF WOUND VAC (Right)  -Open wound left chest- The wound vac was removed and examined. There is tunneling in an anterior direction with significant improvement of the large undermined cavity we have seen previously. The tissues are clean and dry and she has been appropriately treated with oral Keflex x 7 days for Proteus cultured from the wound. Home health nursing is not available to Ms. Frosch in [place] and I do not believe her family would be able to manage the wound vac at home.  I think the wound has progressed to the point that  she may be managed with saline moist to dry dressing changes BID.  Ms. Shamoon affirms that she and her family have had experience with managing a similar wound for her son with traditional wound packing and that they wound be comfortable  with this.   OK to discharge from CTS standpoint.   Wound care instructions:  Irrigate the right chest wound with saline twice daily then pack with 2" gauze or 1" Nu-Gauze moistened with saline then cover with a dry 4x4 and tape.   Will arrange for office follow up with Dr. Vickey Sages in 1 week.   LOS: 34 days    Leary Roca PA-C [phone] 06/10/2020

## 2020-06-10 NOTE — Plan of Care (Signed)
  Problem: Education: Goal: Knowledge of General Education information will improve Description: Including pain rating scale, medication(s)/side effects and non-pharmacologic comfort measures 06/10/2020 1208 by Carloyn Manner I, LPN Outcome: Progressing 06/10/2020 1109 by Carloyn Manner I, LPN Outcome: Progressing   Problem: Health Behavior/Discharge Planning: Goal: Ability to manage health-related needs will improve Outcome: Progressing   Problem: Clinical Measurements: Goal: Ability to maintain clinical measurements within normal limits will improve Outcome: Progressing Goal: Will remain free from infection Outcome: Progressing Goal: Diagnostic test results will improve Outcome: Progressing Goal: Respiratory complications will improve Outcome: Progressing Goal: Cardiovascular complication will be avoided Outcome: Progressing   Problem: Activity: Goal: Risk for activity intolerance will decrease Outcome: Progressing   Problem: Nutrition: Goal: Adequate nutrition will be maintained Outcome: Progressing   Problem: Coping: Goal: Level of anxiety will decrease Outcome: Progressing   Problem: Elimination: Goal: Will not experience complications related to bowel motility Outcome: Progressing Goal: Will not experience complications related to urinary retention Outcome: Progressing   Problem: Pain Managment: Goal: General experience of comfort will improve Outcome: Progressing   Problem: Safety: Goal: Ability to remain free from injury will improve Outcome: Progressing   Problem: Skin Integrity: Goal: Risk for impaired skin integrity will decrease Outcome: Progressing   Problem: Activity: Goal: Ability to tolerate increased activity will improve Outcome: Progressing   Problem: Clinical Measurements: Goal: Ability to maintain a body temperature in the normal range will improve Outcome: Progressing   Problem: Respiratory: Goal: Ability to maintain  adequate ventilation will improve Outcome: Progressing Goal: Ability to maintain a clear airway will improve Outcome: Progressing   Problem: Education: Goal: Ability to describe self-care measures that may prevent or decrease complications (Diabetes Survival Skills Education) will improve Outcome: Progressing Goal: Individualized Educational Video(s) Outcome: Progressing   Problem: Coping: Goal: Ability to adjust to condition or change in health will improve Outcome: Progressing   Problem: Fluid Volume: Goal: Ability to maintain a balanced intake and output will improve Outcome: Progressing   Problem: Health Behavior/Discharge Planning: Goal: Ability to identify and utilize available resources and services will improve Outcome: Progressing Goal: Ability to manage health-related needs will improve Outcome: Progressing   Problem: Metabolic: Goal: Ability to maintain appropriate glucose levels will improve Outcome: Progressing   Problem: Nutritional: Goal: Maintenance of adequate nutrition will improve Outcome: Progressing Goal: Progress toward achieving an optimal weight will improve Outcome: Progressing   Problem: Skin Integrity: Goal: Risk for impaired skin integrity will decrease Outcome: Progressing   Problem: Tissue Perfusion: Goal: Adequacy of tissue perfusion will improve Outcome: Progressing

## 2020-06-10 NOTE — Discharge Summary (Signed)
Physician Discharge Summary  Annette [name] ZOX:[id number] DOB: 11/03/1960 DOA: 05/07/2020  PCP: Patient, No Pcp Per  Admit date: 05/07/2020 Discharge date: 06/10/2020  Admitted From: Home Discharge disposition: Home   Code Status: Partial Code  Diet Recommendation: Cardiac/diabetic diet  Discharge Diagnosis:   Principal Problem:   MRSA pneumonia (HCC) Active Problems:   Pulmonary embolism (HCC)   Pneumothorax   Acute hypoxemic respiratory failure (HCC)   COPD (chronic obstructive pulmonary disease) (HCC)   Pressure injury of skin   Palliative care by specialist   Goals of care, counseling/discussion   DNR (do not resuscitate)  Chief complaint: Cough, shortness of breath  Brief narrative: Annette [name] is a 59 y.o. F with obesity, DM, and COPD not on home O2 who presented to OSH with cough and SOB. At [facility], she was found to have MRSA pneumonia of the right upper lobe, as well as a right lower lobe subsegmental PE.  No need she was treated with linezolid and Lovenox and unfortunately developed a right hydropneumothorax.  A chest tube was placed and she was transferred for CT surgery consultation.  11/23: Transferred from OSH, CT chest showed moderately large right pneumothorax, subcutaneous emphysema, second chest tube placed 11/26: Complicated by hemothorax, acute blood loss anemia 11/28: To OR for evacuation of hematoma, mechanical pleurodesis and wound VAC placement 12/3 Right chest wall wound closure and removal of wound VAC 12/6: Chest tubes removed 12/7 Acute worsening subcutaneous emphysema, including into the face, chest tube replaced 12/9: Second right-sided chest tube placed to persistent right pneumothorax 12/15: Large bore chest tube removed, one CT left in place 12/17: Drainage from a right lateral chest incision; this was opened by CT surgery, culture growing Proteus, second wound VAC placed 12/20: Wound vac removed at bedside,  reapplied 12/21: CT surgery removed last chest tube  Patient's hospitalization is now prolonged because of need of skilled wound care.    Subjective: Patient was seen and examined this morning. She is happy today because CT surgery is okay to discharge home today. She is on low-flow oxygen.  Assessment/Plan: Complicated pneumothorax on the right Superficial wound infection due to Proteus mirabilis -Timeline of events as above. Chest tube off. Cultures of the drainage grew pansensitive Proteus. Patient completed a course of Keflex on 12/26. -Wound care instructions per CT surgery. -Per CT surgery note from this morning  "the wound vac was removed and examined. There is tunneling in an anterior direction with significant improvement of the large undermined cavity we have seen previously. The tissues are clean and dry and she has been appropriately treated with oral Keflex x 7 days for Proteus cultured from the wound. Home health nursing is not available to Annette [name] in [place] and I do not believe her family would be able to manage the wound vac at home.  I think the wound has progressed to the point that  she may be managed with saline moist to dry dressing changes BID.  Annette [name] affirms that she and her family have had experience with managing a similar wound for her son with traditional wound packing and that they wound be comfortable with this.  OK to discharge Wound care instructions:  Irrigate the right chest wound with saline twice daily then pack with 2" gauze or 1" Nu-Gauze moistened with saline then cover with a dry 4x4 and tape.  Will arrange for office follow up with Dr. Vickey Sages in 1 week. "   Pulmonary embolism, acute -This was  diagnosed in outside hospital prior to transfer here. She was started on anticoagulation which was later held for hemothorax. -Repeat CT chest on 12/17 showed improvement in her nonocclusive, resolving PE. -Eliquis resumed. First episode of PE.  3 to 6 months of anticoagulation recommended.  MRSA pneumonia Resolved  Acute respiratory failure with hypoxia -Multifactorial: Pulmonary embolism, pneumonia, pneumothorax, physical deconditioning -Currently on 2 to 3 L oxygen by nasal at rest. Continue the same at home. Home oxygen arranged.  Diabetes, with hyperglycemia -A1c 10.7 on 11/24. -Prior to admission patient was only on glipizide 10 mg twice daily -Currently on glipizide 10 mg twice daily, Lantus 20 units and 25 units as well as scheduled Premeal insulin aspart. -At discharge, will continue glipizide 10 mg twice daily and Lantus 25 units twice daily. Sliding scale premeal. Discussed with patient's daughter-in-law and caregiver Annette [name] who is a home health nurse and is knowledgeable on insulin administration. -Also order for glucometer and diabetic supplies. Recent Labs  Lab 06/09/20 0852 06/09/20 1218 06/09/20 1620 06/09/20 2032 06/10/20 0743  GLUCAP 204* 139* 199* 174* 84   Hyperlipidemia -Continue atorvastatin, fibrate  COPD -No evidence of exacerbation -Continue bronchodilators.  Acute blood loss anemia -Hemoglobin stable between 8 and 9. Recent Labs    05/26/20 0219 05/27/20 0557 05/28/20 0925 05/29/20 0145 05/30/20 0038 05/31/20 1801 06/01/20 0238 06/02/20 0442 06/05/20 0938 06/08/20 0234  HGB 8.2* 8.6* 9.4* 8.6* 8.9* 8.9* 8.7* 8.6* 9.5* 8.3*   Stage II coccyx pressure injury - POA -continue wound care.  Wound care: Incision (Closed) 05/12/20 Chest Right (Active)  Date First Assessed/Time First Assessed: 05/12/20 1338   Location: Chest  Location Orientation: Right    Assessments 05/12/2020  4:05 PM 06/09/2020  9:34 AM  Dressing Type Gauze (Comment);Tape dressing Negative pressure wound therapy  Dressing Old drainage (marked) Clean;Intact  Site / Wound Assessment Dressing in place / Unable to assess --  Margins Attached edges (approximated) --  Drainage Amount -- None     No Linked  orders to display     Incision (Closed) 05/14/20 Chest Right;Upper;Lateral (Active)  Date First Assessed: 05/14/20   Location: Chest  Location Orientation: Right;Upper;Lateral  Present on Admission: No    Assessments 05/15/2020  9:02 PM 06/09/2020  9:34 AM  Dressing Type None None  Site / Wound Assessment Clean;Dry Clean;Dry  Margins Attached edges (approximated) --  Closure Staples;Approximated Staples  Drainage Amount None None     No Linked orders to display     Incision (Closed) 05/17/20 Chest Right (Active)  Date First Assessed/Time First Assessed: 05/17/20 1516   Location: Chest  Location Orientation: Right    Assessments 05/17/2020  3:40 PM 06/09/2020  9:34 AM  Dressing Type Gauze (Comment) Gauze (Comment)  Dressing Clean;Dry;Intact Clean;Intact;Dry  Site / Wound Assessment Dressing in place / Unable to assess --     No Linked orders to display    Discharge Exam:   Vitals:   06/09/20 0500 06/09/20 1624 06/09/20 2031 06/10/20 0530  BP:  108/66 106/69 96/65  Pulse:  77 91 75  Resp:  16 19 18   Temp:   97.7 F (36.5 C) 97.9 F (36.6 C)  TempSrc:    Oral  SpO2:  95% 97% 98%  Weight: 105.5 kg     Height:        Body mass index is 35.36 kg/m.  General exam: Pleasant, middle-aged Caucasian female. Skin: No rashes, lesions or ulcers. HEENT: Atraumatic, normocephalic, no obvious bleeding Lungs: Clear to auscultation bilaterally.  Right chest wall wound care per CT surgery CVS: Regular rate and rhythm, no murmur GI/Abd soft, nontender, nondistended, bowel sound present CNS: Alert, awake, oriented x3 Psychiatry: Mood appropriate Extremities: No pedal edema, no calf tenderness  Follow ups:   Discharge Instructions    Diet - low sodium heart healthy   Complete by: As directed    Diet - low sodium heart healthy   Complete by: As directed    Diet Carb Modified   Complete by: As directed    Discharge wound care:   Complete by: As directed    Ordered    05/16/20  1152   Wound care  Every shift      Comments: Apply Gerhardt's Butt cream to buttocks and groin area each shift or PRN soiling   Discharge wound care:   Complete by: As directed    Wound care instructions per CT surgery:  Irrigate the right chest wound with saline twice daily then pack with 2" gauze or 1" Nu-Gauze moistened with saline then cover with a dry 4x4 and tape.   Increase activity slowly   Complete by: As directed    Increase activity slowly   Complete by: As directed       Follow-up Information    [name], MD Follow up on 06/17/2020.   Specialty: Cardiothoracic Surgery Why: Appointment is a 3:30PM.  Please arrive 30 minutes early for a chet x-ray to be performed by [facility] located on the first floor of the same building.  Contact information: [address] [place] [address] [phone]        Annette Birks PA-C. Go on 06/10/2020.   Why: @ 8:45am for hospital followup Contact information: [address] [place], [place] [address] Phone - [phone]        [facility] Follow up.   Why: [employer] is providing your oxygen at home.  Contact information: [address] [place] [address] [phone]         [facility] Follow up.   Contact information: [address] [place] [address] [phone]              Recommendations for Outpatient Follow-Up:   1. Follow-up with CT surgery as an outpatient  Discharge Instructions:  Follow with Primary MD Patient, No Pcp Per in 7 days   Get CBC/BMP checked in next visit within 1 week by PCP or SNF MD ( we routinely change or add medications that can affect your baseline labs and fluid status, therefore we recommend that you get the mentioned basic workup next visit with your PCP, your PCP may decide not to get them or add new tests based on their clinical decision)  On your next visit with your PCP, please  Get Medicines reviewed and adjusted.  Please request your PCP  to go over all Hospital Tests and Procedure/Radiological results at the follow up, please get all Hospital records sent to your Prim MD by signing hospital release before you go home.  Activity: As tolerated with Full fall precautions use walker/cane & assistance as needed  For Heart failure patients - Check your Weight same time everyday, if you gain over 2 pounds, or you develop in leg swelling, experience more shortness of breath or chest pain, call your Primary MD immediately. Follow Cardiac Low Salt Diet and 1.5 lit/day fluid restriction.  If you have smoked or chewed Tobacco in the last 2 yrs  please stop smoking, stop Annette regular Alcohol  and or Annette Recreational drug use.  If you experience worsening of your admission symptoms, develop shortness of breath, life threatening emergency, suicidal or homicidal thoughts you must seek medical attention immediately by calling 911 or calling your MD immediately  if symptoms less severe.  You Must read complete instructions/literature along with all the possible adverse reactions/side effects for all the Medicines you take and that have been prescribed to you. Take Annette new Medicines after you have completely understood and accpet all the possible adverse reactions/side effects.   Do not drive, operate heavy machinery, perform activities at heights, swimming or participation in water activities or provide baby sitting services if your were admitted for syncope or siezures until you have seen by Primary MD or a Neurologist and advised to do so again.  Do not drive when taking Pain medications.  Do not take more than prescribed Pain, Sleep and Anxiety Medications  Wear Seat belts while driving.   Please note You were cared for by a hospitalist during your hospital stay. If you have Annette questions about your discharge medications or the care you received while you were in the hospital after  you are discharged, you can call the unit and asked to speak with the hospitalist on call if the hospitalist that took care of you is not available. Once you are discharged, your primary care physician will handle Annette further medical issues. Please note that NO REFILLS for Annette discharge medications will be authorized once you are discharged, as it is imperative that you return to your primary care physician (or establish a relationship with a primary care physician if you do not have one) for your aftercare needs so that they can reassess your need for medications and monitor your lab values.    Allergies as of 06/10/2020      Reactions   Cymbalta [duloxetine Hcl]    Psychosis "self-harm"      Medication List    TAKE these medications   apixaban 5 MG Tabs tablet Commonly known as: ELIQUIS Take 1 tablet (5 mg total) by mouth 2 (two) times daily. Start taking on: June 11, 2020   atorvastatin 40 MG tablet Commonly known as: LIPITOR Take 40 mg by mouth daily.   cholecalciferol 25 MCG (1000 UNIT) tablet Commonly known as: VITAMIN D3 Take 1,000 Units by mouth daily.   clonazePAM 0.5 MG tablet Commonly known as: KLONOPIN Take 1 tablet (0.5 mg total) by mouth 2 (two) times daily.   fenofibrate 160 MG tablet Take 160 mg by mouth daily.   glipiZIDE 10 MG tablet Commonly known as: GLUCOTROL Take 10 mg by mouth 2 (two) times daily.   insulin glargine 100 UNIT/ML Solostar Pen Commonly known as: LANTUS Inject 20 Units into the skin 2 (two) times daily.   montelukast 10 MG tablet Commonly known as: SINGULAIR Take 10 mg by mouth daily.   nystatin powder Commonly known as: MYCOSTATIN/NYSTOP Apply topically 2 (two) times daily.   omeprazole 40 MG capsule Commonly known as: PRILOSEC Take 40 mg by mouth 2 (two) times daily.   oxyCODONE 5 MG immediate release tablet Commonly known as: Oxy IR/ROXICODONE Take 1-2 tablets (5-10 mg total) by mouth every 4 (four) hours as needed for  moderate pain.   polyethylene glycol 17 g packet Commonly known as: MIRALAX / GLYCOLAX Take 17 g by mouth 2 (two) times daily as needed for mild constipation.   pregabalin 150 MG capsule Commonly known  as: LYRICA Take 1 capsule (150 mg total) by mouth in the morning, at noon, and at bedtime.   senna-docusate 8.6-50 MG tablet Commonly known as: Senokot-S Take 1 tablet by mouth at bedtime.   umeclidinium bromide 62.5 MCG/INH Aepb Commonly known as: INCRUSE ELLIPTA Inhale 1 puff into the lungs daily.            Durable Medical Equipment  (From admission, onward)         Start     Ordered   06/05/20 1234  For home use only DME oxygen  Once       Question Answer Comment  Length of Need Lifetime   Mode or (Route) Nasal cannula   Liters per Minute 3   Frequency Continuous (stationary and portable oxygen unit needed)   Oxygen conserving device Yes   Oxygen delivery system Gas      06/05/20 1233   06/05/20 1228  For home use only DME Hospital bed  Once       Question Answer Comment  Length of Need Lifetime   The above medical condition requires: Patient requires the ability to reposition frequently   Bed type Semi-electric   Hoyer Lift Yes   Trapeze Bar Yes   Support Surface: Gel Overlay      06/05/20 1227   06/04/20 1833  For home use only DME 3 n 1  Once        06/04/20 1833   06/04/20 1833  For home use only DME Hospital bed  Once       Question Answer Comment  Length of Need 12 Months   Patient has (list medical condition): pneumothorax and severe COPD and pneumonia and hemothorax   The above medical condition requires: Patient requires the ability to reposition frequently   Head must be elevated greater than: 30 degrees   Bed type Semi-electric   Hoyer Lift Yes   Support Surface: Alternating Pressure Pad and Pump      06/04/20 1833   05/30/20 1432  For home use only DME Hospital bed  Once       Question Answer Comment  Length of Need 6 Months   The above  medical condition requires: Patient requires the ability to reposition frequently   Bed type Semi-electric   Hoyer Lift Yes   Support Surface: Low Air loss Mattress      05/30/20 1431           Discharge Care Instructions  (From admission, onward)         Start     Ordered   06/10/20 0000  Discharge wound care:       Comments: Wound care instructions per CT surgery:  Irrigate the right chest wound with saline twice daily then pack with 2" gauze or 1" Nu-Gauze moistened with saline then cover with a dry 4x4 and tape.   06/10/20 1135   05/21/20 0000  Discharge wound care:       Comments: Ordered    05/16/20 1152   Wound care  Every shift      Comments: Apply Gerhardt's Butt cream to buttocks and groin area each shift or PRN soiling   05/21/20 0943          Time coordinating discharge: 35 minutes  The results of significant diagnostics from this hospitalization (including imaging, microbiology, ancillary and laboratory) are listed below for reference.    Procedures and Diagnostic Studies:   CT CHEST WO CONTRAST  Result Date: 05/08/2020  CLINICAL DATA:  Shortness of breath and cough. EXAM: CT CHEST WITHOUT CONTRAST TECHNIQUE: Multidetector CT imaging of the chest was performed following the standard protocol without IV contrast. COMPARISON:  Chest radiograph 05/08/2020 FINDINGS: Cardiovascular: Normal heart size. No pericardial effusions. Normal caliber thoracic aorta with scattered calcification. Coronary artery calcification. Mediastinum/Nodes: Pneumomediastinum is present. Subcutaneous emphysema throughout the right chest wall and extending to the right side of the neck. No significant lymphadenopathy. Visualized lymph nodes are not pathologically enlarged. Esophagus is mostly decompressed. Lungs/Pleura: Moderately large right pneumothorax. A right chest tube is in place. Atelectasis and consolidation in the right lung most prominent in the right upper lung. Air bronchograms  and emphysematous changes are present. Small bilateral pleural effusions. Emphysematous changes and scattered fibrosis in the left lung. Upper Abdomen: Cyst in the liver. No acute process demonstrated in the upper abdomen. Musculoskeletal: Degenerative changes in the spine. No destructive bone lesions. IMPRESSION: 1. Moderately large right pneumothorax with right chest tube in place. Subcutaneous emphysema throughout the right chest wall and extending to the right side of the neck. Pneumomediastinum. 2. Atelectasis and consolidation in the right lung most prominent in the right upper lung. 3. Small bilateral pleural effusions. 4. Emphysematous changes and fibrosis in the left lung. 5. Emphysema and aortic atherosclerosis. Aortic Atherosclerosis (ICD10-I70.0) and Emphysema (ICD10-J43.9). Electronically Signed   By: Burman NievesWilliam  Stevens M.D.   On: 05/08/2020 02:15   DG CHEST PORT 1 VIEW  Result Date: 05/08/2020 CLINICAL DATA:  Pneumothorax EXAM: PORTABLE CHEST 1 VIEW COMPARISON:  November 28, 2012 FINDINGS: There is a right-sided chest tube in place. The chest tube may be kinked. There is extensive subcutaneous gas along the patient's right chest wall and right neck. There is diffuse opacification of the right upper lung zone as well as the right lower lung zone. There is a probable small right-sided pneumothorax. No evidence for left-sided pneumothorax. The heart size is unremarkable. IMPRESSION: 1. Right-sided chest tube in place.  The chest tube may be kinked. 2. Probable small right-sided pneumothorax. 3. Diffuse opacification of the right upper lung zone and portions of the right lower lobe which may be postsurgical or posttraumatic in etiology. Alternatively, this may represent multifocal pneumonia. 4. Subcutaneous gas is noted along the patient's right chest wall and neck. Electronically Signed   By: Katherine Mantlehristopher  Green M.D.   On: 05/08/2020 00:33   CT IMAGE GUIDED DRAINAGE BY PERCUTANEOUS CATHETER  Result  Date: 05/08/2020 CLINICAL DATA:  History of COVID pneumonia 2 months ago, right upper lobe MRSA pneumonia, right lower lobe pulmonary emboli, complex right hydropneumothorax, incompletely controlled with surgical chest tube. Subcutaneous emphysema and pneumomediastinum. Additional chest drain requested. EXAM: CT GUIDED CHEST DRAIN PLACEMENT ANESTHESIA/SEDATION: Intravenous Fentanyl 25mcg and Versed 1.5mg  were administered as conscious sedation during continuous monitoring of the patient's level of consciousness and physiological / cardiorespiratory status by the radiology RN, with a total moderate sedation time of 18 minutes. PROCEDURE: The procedure, risks, benefits, and alternatives were explained to the patient. Questions regarding the procedure were encouraged and answered. The patient understands and consents to the procedure. Select axial scans through the chest were obtained. The anterior pneumothorax was localized and an appropriate skin entry site was determined and marked. The operative field was prepped with chlorhexidinein a sterile fashion, and a sterile drape was applied covering the operative field. A sterile gown and sterile gloves were used for the procedure. Local anesthesia was provided with 1% Lidocaine. Under CT fluoroscopic guidance, a percutaneous entry needle advanced into  the anterior component of the right pneumothorax. Guidewire was advanced apically. Tract dilated to facilitate placement of a 14 French pigtail catheter, directed towards the apex. Catheter was connected to Pleur-evac suction -20 cm H2O. Follow-up scan shows good position of chest tube, partial evacuation of loculated anterior component, no immediate complication. Catheter secured externally with 0 Prolene suture and StatLock and covered with Vaseline gauze and sterile gauze dressing. The patient tolerated the procedure well. COMPLICATIONS: None immediate FINDINGS: Anterior right pneumothorax was localized. 14 pigtail  drain catheter placed, directed apically. IMPRESSION: Technically successful CT-guided right chest tube placement. Electronically Signed   By: Corlis Leak M.D.   On: 05/08/2020 17:01     Labs:   Basic Metabolic Panel: Recent Labs  Lab 06/05/20 0938 06/08/20 0234  NA 133* 135  K 4.2 4.4  CL 97* 99  CO2 27 30  GLUCOSE 220* 151*  BUN 16 21*  CREATININE 0.60 0.78  CALCIUM 8.5* 8.6*  MG 1.6* 2.0   GFR Estimated Creatinine Clearance: 95 mL/[name] (by C-G formula based on SCr of 0.78 mg/dL). Liver Function Tests: No results for input(s): AST, ALT, ALKPHOS, BILITOT, PROT, ALBUMIN in the last 168 hours. No results for input(s): LIPASE, AMYLASE in the last 168 hours. No results for input(s): AMMONIA in the last 168 hours. Coagulation profile No results for input(s): INR, PROTIME in the last 168 hours.  CBC: Recent Labs  Lab 06/05/20 0938 06/08/20 0234  WBC 14.0* 6.7  NEUTROABS 12.4* 4.4  HGB 9.5* 8.3*  HCT 29.4* 27.7*  MCV 89.6 91.7  PLT 209 184   Cardiac Enzymes: No results for input(s): CKTOTAL, CKMB, CKMBINDEX, TROPONINI in the last 168 hours. BNP: Invalid input(s): POCBNP CBG: Recent Labs  Lab 06/09/20 0852 06/09/20 1218 06/09/20 1620 06/09/20 2032 06/10/20 0743  GLUCAP 204* 139* 199* 174* 84   D-Dimer No results for input(s): DDIMER in the last 72 hours. Hgb A1c No results for input(s): HGBA1C in the last 72 hours. Lipid Profile No results for input(s): CHOL, HDL, LDLCALC, TRIG, CHOLHDL, LDLDIRECT in the last 72 hours. Thyroid function studies No results for input(s): TSH, T4TOTAL, T3FREE, THYROIDAB in the last 72 hours.  Invalid input(s): FREET3 Anemia work up No results for input(s): VITAMINB12, FOLATE, FERRITIN, TIBC, IRON, RETICCTPCT in the last 72 hours. Microbiology Recent Results (from the past 240 hour(s))  Aerobic Culture (superficial specimen)     Status: None   Collection Time: 05/31/20  1:55 PM   Specimen: Wound  Result Value Ref Range  Status   Specimen Description WOUND RIGHT CHEST  Final   Special Requests NONE  Final   Gram Stain   Final    MODERATE WBC PRESENT,BOTH PMN AND MONONUCLEAR MODERATE GRAM NEGATIVE RODS Performed at [facility], [address] [place], [place] [address]    Culture MODERATE PROTEUS MIRABILIS  Final   Report Status 06/02/2020 FINAL  Final   Organism ID, Bacteria PROTEUS MIRABILIS  Final      Susceptibility   Proteus mirabilis - MIC*    AMPICILLIN <=2 SENSITIVE Sensitive     CEFAZOLIN <=4 SENSITIVE Sensitive     CEFEPIME <=0.12 SENSITIVE Sensitive     CEFTAZIDIME <=1 SENSITIVE Sensitive     CEFTRIAXONE <=0.25 SENSITIVE Sensitive     CIPROFLOXACIN <=0.25 SENSITIVE Sensitive     GENTAMICIN <=1 SENSITIVE Sensitive     IMIPENEM 2 SENSITIVE Sensitive     TRIMETH/SULFA <=20 SENSITIVE Sensitive     AMPICILLIN/SULBACTAM <=2 SENSITIVE Sensitive  PIP/TAZO <=4 SENSITIVE Sensitive     * MODERATE PROTEUS MIRABILIS     Signed: Kelyn Ponciano  [facility] [facility] 06/10/2020, 11:36 AM

## 2020-06-10 NOTE — Plan of Care (Signed)
  Problem: Education: Goal: Knowledge of General Education information will improve Description Including pain rating scale, medication(s)/side effects and non-pharmacologic comfort measures Outcome: Progressing   

## 2020-06-10 NOTE — Progress Notes (Signed)
Occupational Therapy Treatment Patient Details Name: Annette [name] MRN: [id number] DOB: 02-21-1961 Today's Date: 06/10/2020    History of present illness 59 yo female admitted to [facility] on 05/03/2020 with R upper lobe MRSA pneumonia and R lower lobe subsegmental PE. Developed right-sided hydropneumothorax on 11/22 underwent chest tube insertion and on 05/06/2020 transfered to [facility] for CTS evaluation and treatment. Additional R chest tube placement 11/24.  On 11/28, patient underwent VATS/thoracotomy mechanical pleurodesis and wound VAC placement. 3rd chest tube to be placed 12/9. 12/16 one R chest tube removed.  PMH including Covid pneumonia approximately 2 months ago, recent PE on Eliquis, COPD, asthma, depression, hypertension, and diabetes.   OT comments  Pt making limited progress towards OT goals. Focused on transfers as Pt set to dc today and wanted to practice for getting in the car. Pt Mod A +2 for bed mobility, Good balance sitting EOB where she required mod A for UB bathing, and total A for LB dressing. With RW and heavy mod A +2 she was able to come to standing with significant trunk flexion (apparently baseline). Focused on education really rocking and getting momentum to stand. Pt and daughter-in-law provided with gait belt and made RN aware that it will require significant assist to get patient in car (and family states they can get her out of car at home). Daughter in law present and verbalized understanding of level of care and that the family is ready to assist and feels capable. OT feels that SNF remains most appropriate dc for safety and for increased frequency of therapy but Pt and family has elected to go home.   Follow Up Recommendations  Home health OT;Supervision/Assistance - 24 hour    Equipment Recommendations  None recommended by OT (Pt has hoyer lift, hospital bed, BSC, tub bench, WC for home)    Recommendations for Other Services PT  consult    Precautions / Restrictions Precautions Precautions: Fall Restrictions Weight Bearing Restrictions: No       Mobility Bed Mobility Overal bed mobility: Needs Assistance Bed Mobility: Supine to Sit;Sit to Supine Rolling: Mod assist   Supine to sit: Mod assist (Pt able to walk her legs to EOB, mod A for trunk elevation and to bring hips EOB) Sit to supine: Mod assist;+2 for physical assistance      Transfers Overall transfer level: Needs assistance Equipment used: Rolling walker (2 wheeled) Transfers: Sit to/from Stand Sit to Stand: Mod assist;+2 physical assistance;+2 safety/equipment;From elevated surface         General transfer comment: ModAx2 to power up, on first attempt she kept her trunk flexed over at almost 70 degrees and said she could not stand up right, on second attempt she stood upright without difficulty. Attempted but unable to take lateral side steps up EOB. educated Pt and daughter-in-law on using momentum and true rocking to achieve upright    Balance Overall balance assessment: Needs assistance Sitting-balance support: No upper extremity supported;Feet supported Sitting balance-Leahy Scale: Fair Sitting balance - Comments: unchallenged balance sitting EOB is good   Standing balance support: Bilateral upper extremity supported Standing balance-Leahy Scale: Poor Standing balance comment: Pt requriring modA+2, RW and power-up assist for sit to stand                           ADL either performed or assessed with clinical judgement   ADL Overall ADL's : Needs assistance/impaired  Lower Body Dressing: Total assistance;Bed level   Toilet Transfer: Moderate assistance;+2 for physical assistance;+2 for safety/equipment;RW           Functional mobility during ADLs: Moderate assistance;+2 for physical assistance General ADL Comments: Time spent with Pt and family discussing home set up and equipment -  they do have appropriate DME at home and have tested to make sure that hoyer lift works. Educated on using rocking momentum for transfers. Daughter in law in room and confirms that she feels comfortable and is aware of the level of assist required     Vision       Perception     Praxis      Cognition Arousal/Alertness: Awake/alert Behavior During Therapy: WFL for tasks assessed/performed Overall Cognitive Status: Impaired/Different from baseline Area of Impairment: Safety/judgement                         Safety/Judgement: Decreased awareness of safety;Decreased awareness of deficits     General Comments: spent time education Pt and family on assisting Pt especially with transfers. Pt keeps stating "Oh, I can do that" but continues to require significant physical assist"        Exercises     Shoulder Instructions       General Comments daughter-in-law in room and verbalizing that she feels comfortable taking her home and understands the level of care she will need (Daughter in law has background in [facility] care as well as assisting family member who required significant assist)    Pertinent Vitals/ Pain       Pain Assessment: Faces Faces Pain Scale: Hurts little more Pain Location: R shoulder Pain Descriptors / Indicators: Discomfort Pain Intervention(s): Limited activity within patient's tolerance;Monitored during session;Repositioned  Home Living                                          Prior Functioning/Environment              Frequency  Min 2X/week        Progress Toward Goals  OT Goals(current goals can now be found in the care plan section)  Progress towards OT goals: Progressing toward goals  Acute Rehab OT Goals Patient Stated Goal: get home OT Goal Formulation: With patient Time For Goal Achievement: 06/12/20 Potential to Achieve Goals: Fair  Plan Discharge plan needs to be updated (PT has declined SNF (which is most  appropriate recommendation))    Co-evaluation                 AM-PAC OT "6 Clicks" Daily Activity     Outcome Measure   Help from another person eating meals?: None Help from another person taking care of personal grooming?: A Little Help from another person toileting, which includes using toliet, bedpan, or urinal?: Total Help from another person bathing (including washing, rinsing, drying)?: A Lot Help from another person to put on and taking off regular upper body clothing?: A Lot Help from another person to put on and taking off regular lower body clothing?: Total 6 Click Score: 13    End of Session Equipment Utilized During Treatment: Gait belt;Rolling walker;Oxygen (2L)  OT Visit Diagnosis: Unsteadiness on feet (R26.81);Pain Pain - Right/Left: Right Pain - part of body: Shoulder (side)   Activity Tolerance Patient tolerated treatment well   Patient Left in bed;with call  bell/phone within reach   Nurse Communication Mobility status (will need significant asssit to get in the car)        Time: 3875-6433 OT Time Calculation (min): 28 min  Charges: OT General Charges $OT Visit: 1 Visit OT Treatments $Self Care/Home Management : 8-22 mins $Therapeutic Activity: 8-22 mins  Nyoka Cowden OTR/L Acute Rehabilitation Services Pager: [phone] Office: [phone]   Evern [employer] Hardeep Reetz 06/10/2020, 12:26 PM

## 2020-06-10 NOTE — Progress Notes (Signed)
Pt wound care Education and demontration with daughter  In-law was done. All belonging were taken home clothes, shoes cell phone and charger cord.  Phone charger port and son papers were left behind called Daughter In-Law and son no answer about the bag that was left behind. Pt left by Family in car.

## 2020-06-11 ENCOUNTER — Other Ambulatory Visit: Payer: Self-pay | Admitting: Internal Medicine

## 2020-06-11 MED ORDER — OXYCODONE HCL 10 MG PO TABS: 10.0000 mg | ORAL_TABLET | Freq: Four times a day (QID) | ORAL | 0 refills | Status: DC | PRN

## 2020-06-11 MED ORDER — OXYCODONE-ACETAMINOPHEN 10-325 MG PO TABS: 1.0000 | ORAL_TABLET | Freq: Four times a day (QID) | ORAL | 0 refills | Status: AC | PRN

## 2020-06-11 NOTE — Progress Notes (Signed)
Patient called to report that her pharmacy didn't receive pain medicine scripts. She has fair amount of pain while in the hospital and genuinely needs pain meds at home. I sent a 7 day supply of oxycodone.

## 2020-06-17 ENCOUNTER — Ambulatory Visit: Payer: Self-pay | Admitting: Cardiothoracic Surgery

## 2020-07-04 ENCOUNTER — Telehealth: Payer: Self-pay

## 2020-07-04 NOTE — Telephone Encounter (Signed)
Patient contacted the office with complaints of pain, mainly in her hips and really that she "hurts all over".  She stated that she is having to be lifted by family on the bedpan, is currently bedridden now and has end stage DDD and arthritis.  She stated that she thought Dr. Vickey Sages prescribed her Oxycodone at discharge from the hospital.  Advised that Dr. Vickey Sages would not prescribe medication for generalized/ hip pain and that she would need to contact her PCP for pain medication refills.  She is s/p VATS/ Pleurodesis/ Evacuation of hematoma 05/12/20 and went back for chest wall closure and wound vac placement on 05/17/20.  She acknowledged receipt.

## 2020-07-15 ENCOUNTER — Ambulatory Visit: Payer: Self-pay | Admitting: Cardiothoracic Surgery

## 2021-08-04 IMAGING — DX DG CHEST 1V PORT
1 series · 1 of 1 positions shown · non-contrast
Comparison: Chest radiograph 05/24/2020, CT 05/22/2020

CLINICAL DATA: Extensive subcutaneous emphysema.

EXAM:
PORTABLE CHEST 1 VIEW

[chest ap]
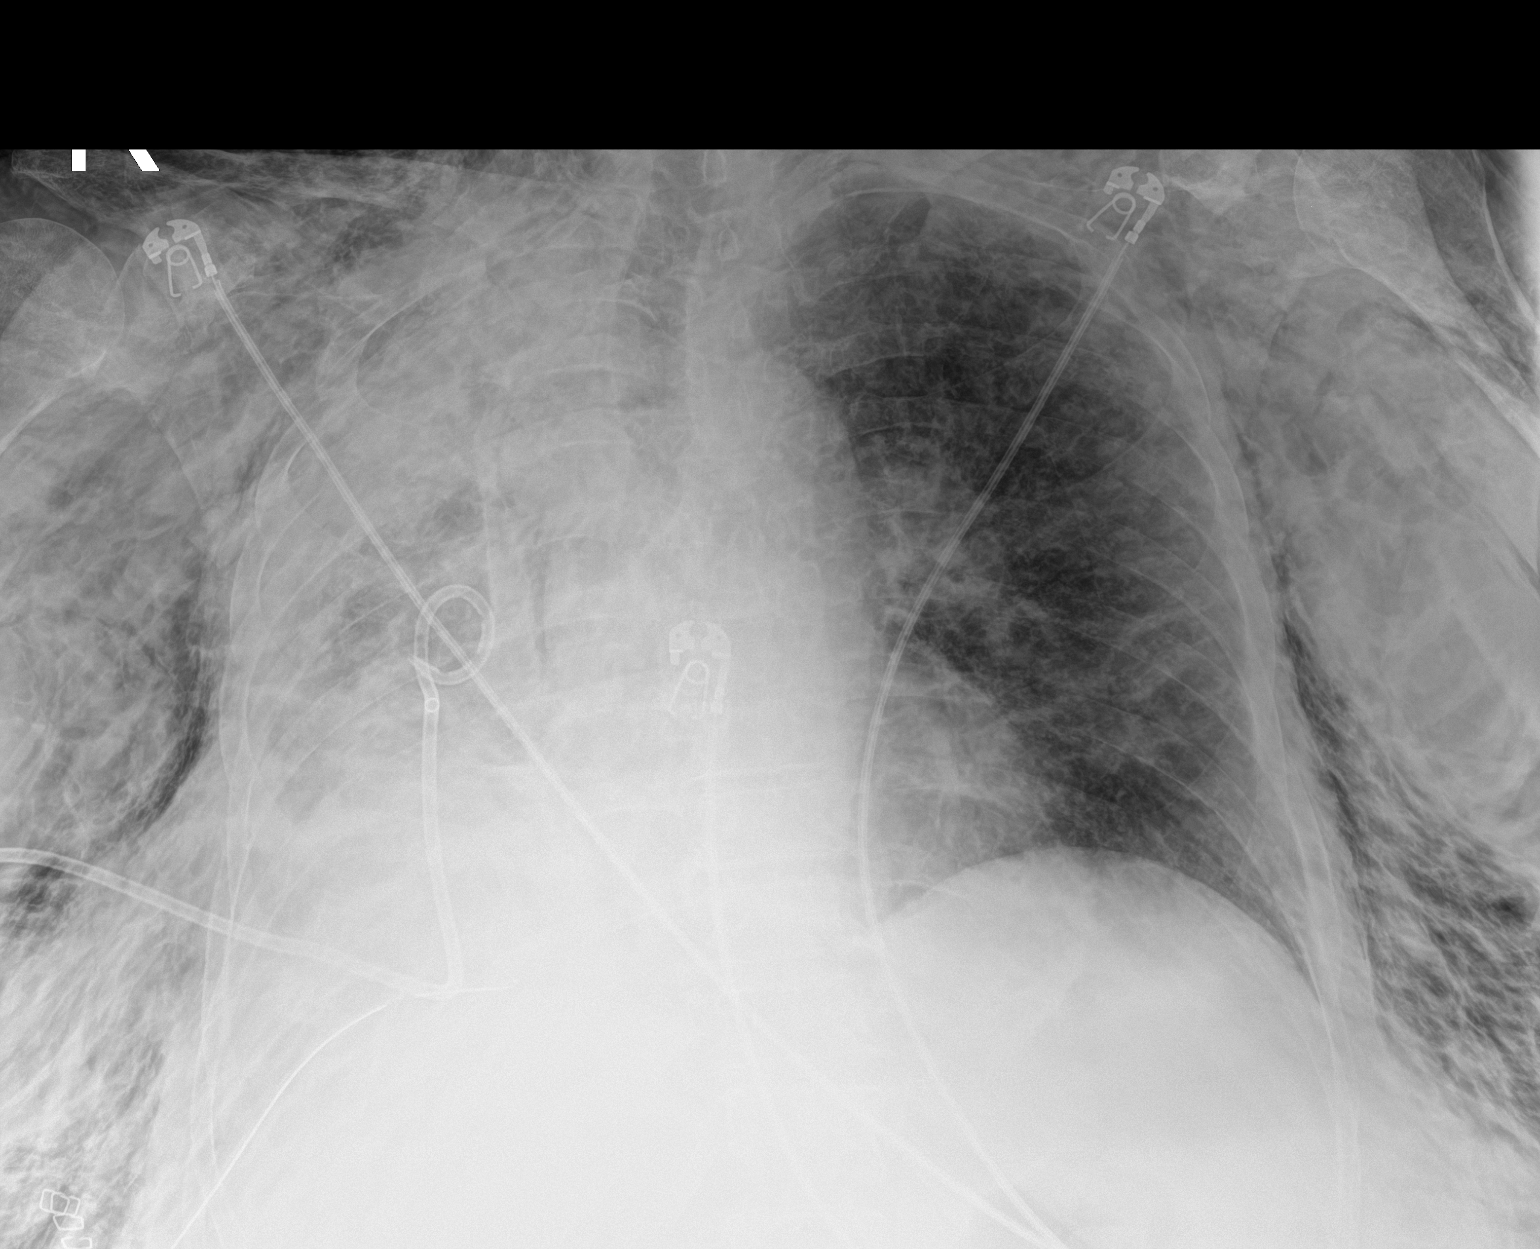

[1 of 1 positions shown; findings below may reference images not displayed]

FINDINGS: Extensive subcutaneous emphysema within the LEFT and RIGHT chest
wall unchanged. RIGHT pigtail catheter in place. Consolidation in
the RIGHT lung again noted. LEFT lung appears clear.
IMPRESSION: 1. No interval change in extensive subcutaneous emphysema.
2. RIGHT pigtail catheter in place.
3. RIGHT lung upper lobe consolidation.

## 2021-08-05 IMAGING — DX DG CHEST 1V PORT
1 series · 1 of 1 positions shown · non-contrast
Comparison: One day prior

CLINICAL DATA: Subcutaneous

EXAM:
PORTABLE CHEST 1 VIEW

[chest ap]
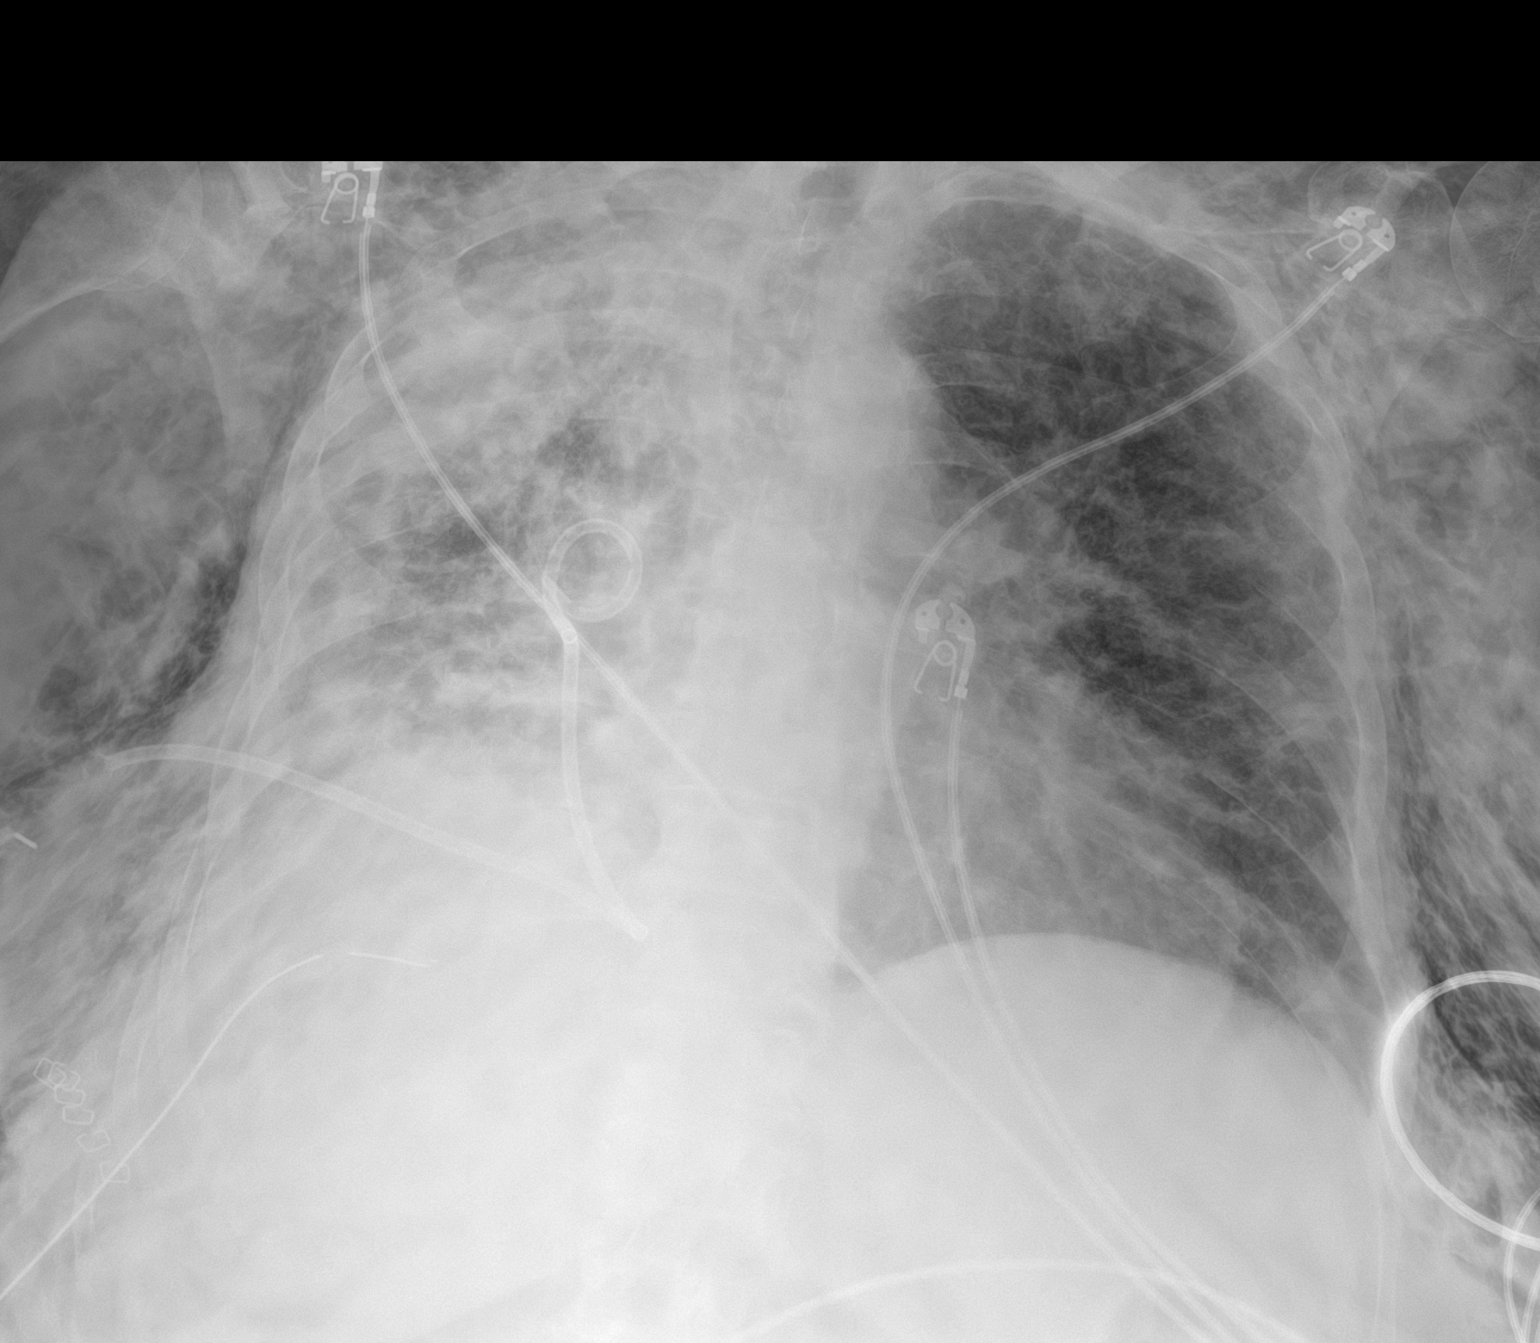

[1 of 1 positions shown; findings below may reference images not displayed]

FINDINGS: Right-sided chest tubes are in place and unchanged from prior study.
Extensive subcutaneous emphysema is again noted. There is extensive
airspace disease throughout the right lung field, unchanged from
prior study. There is no definite left-sided pneumothorax however
evaluation is limited by the extensive subcutaneous emphysema. The
heart size is unchanged. Pneumomediastinum is again noted.
IMPRESSION: No significant interval change.

## 2021-08-08 IMAGING — DX DG CHEST 1V PORT
1 series · 1 of 1 positions shown · non-contrast
Comparison: 05/27/2020

CLINICAL DATA: Follow-up pneumothorax

EXAM:
PORTABLE CHEST 1 VIEW

[chest]
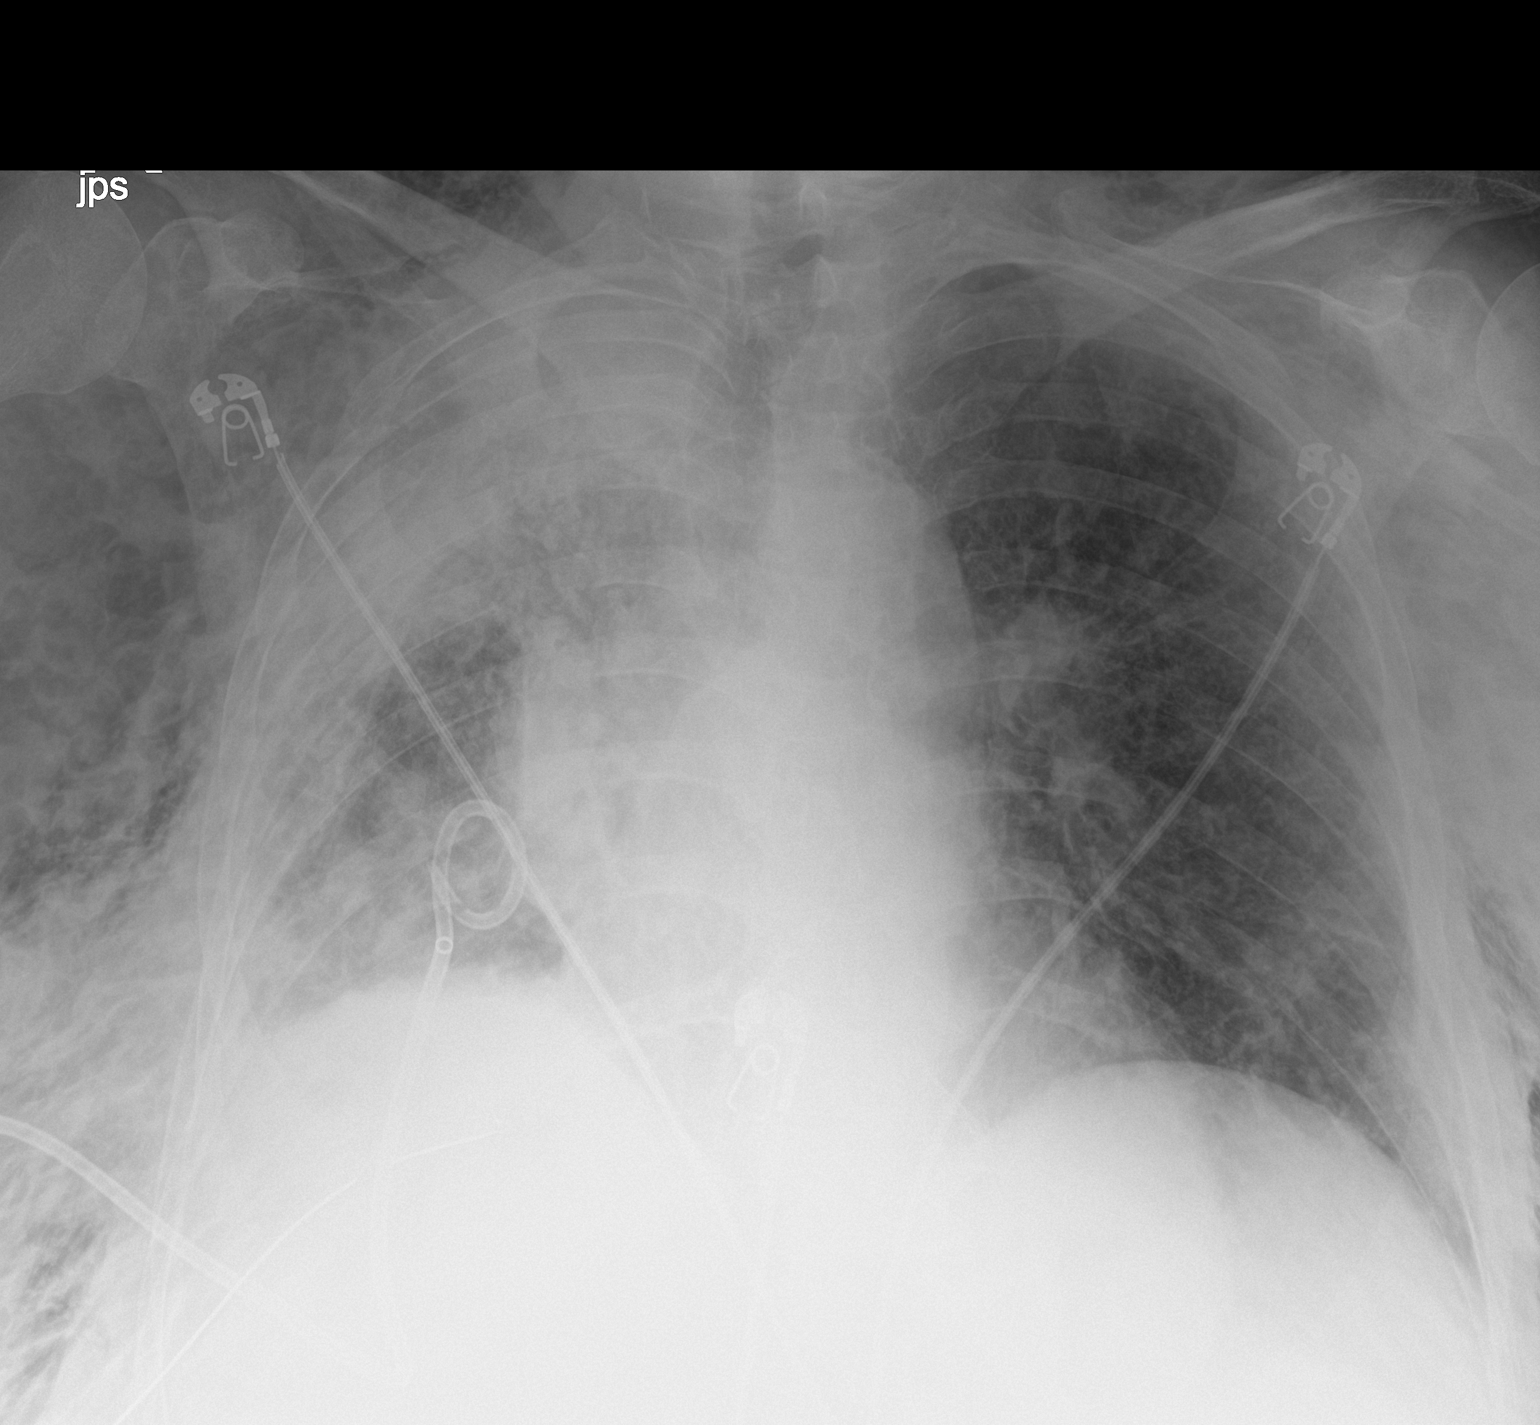

[1 of 1 positions shown; findings below may reference images not displayed]

FINDINGS: Cardiac shadow is stable. Diffuse subcutaneous emphysema is again
seen but slightly decreased. Right-sided pigtail catheter is again
noted. No pneumothorax is seen. Persistent opacity is noted in the
right apex stable from the prior study. No new focal infiltrate is
seen. No bony abnormality is noted.
IMPRESSION: Stable appearance of the chest when compared with the prior exam.

## 2021-08-09 IMAGING — DX DG CHEST 1V PORT
1 series · 1 of 1 positions shown · non-contrast
Comparison: May 29, 2020

CLINICAL DATA: Chest tube present.  Recent pneumothorax.

EXAM:
PORTABLE CHEST 1 VIEW

[chest]
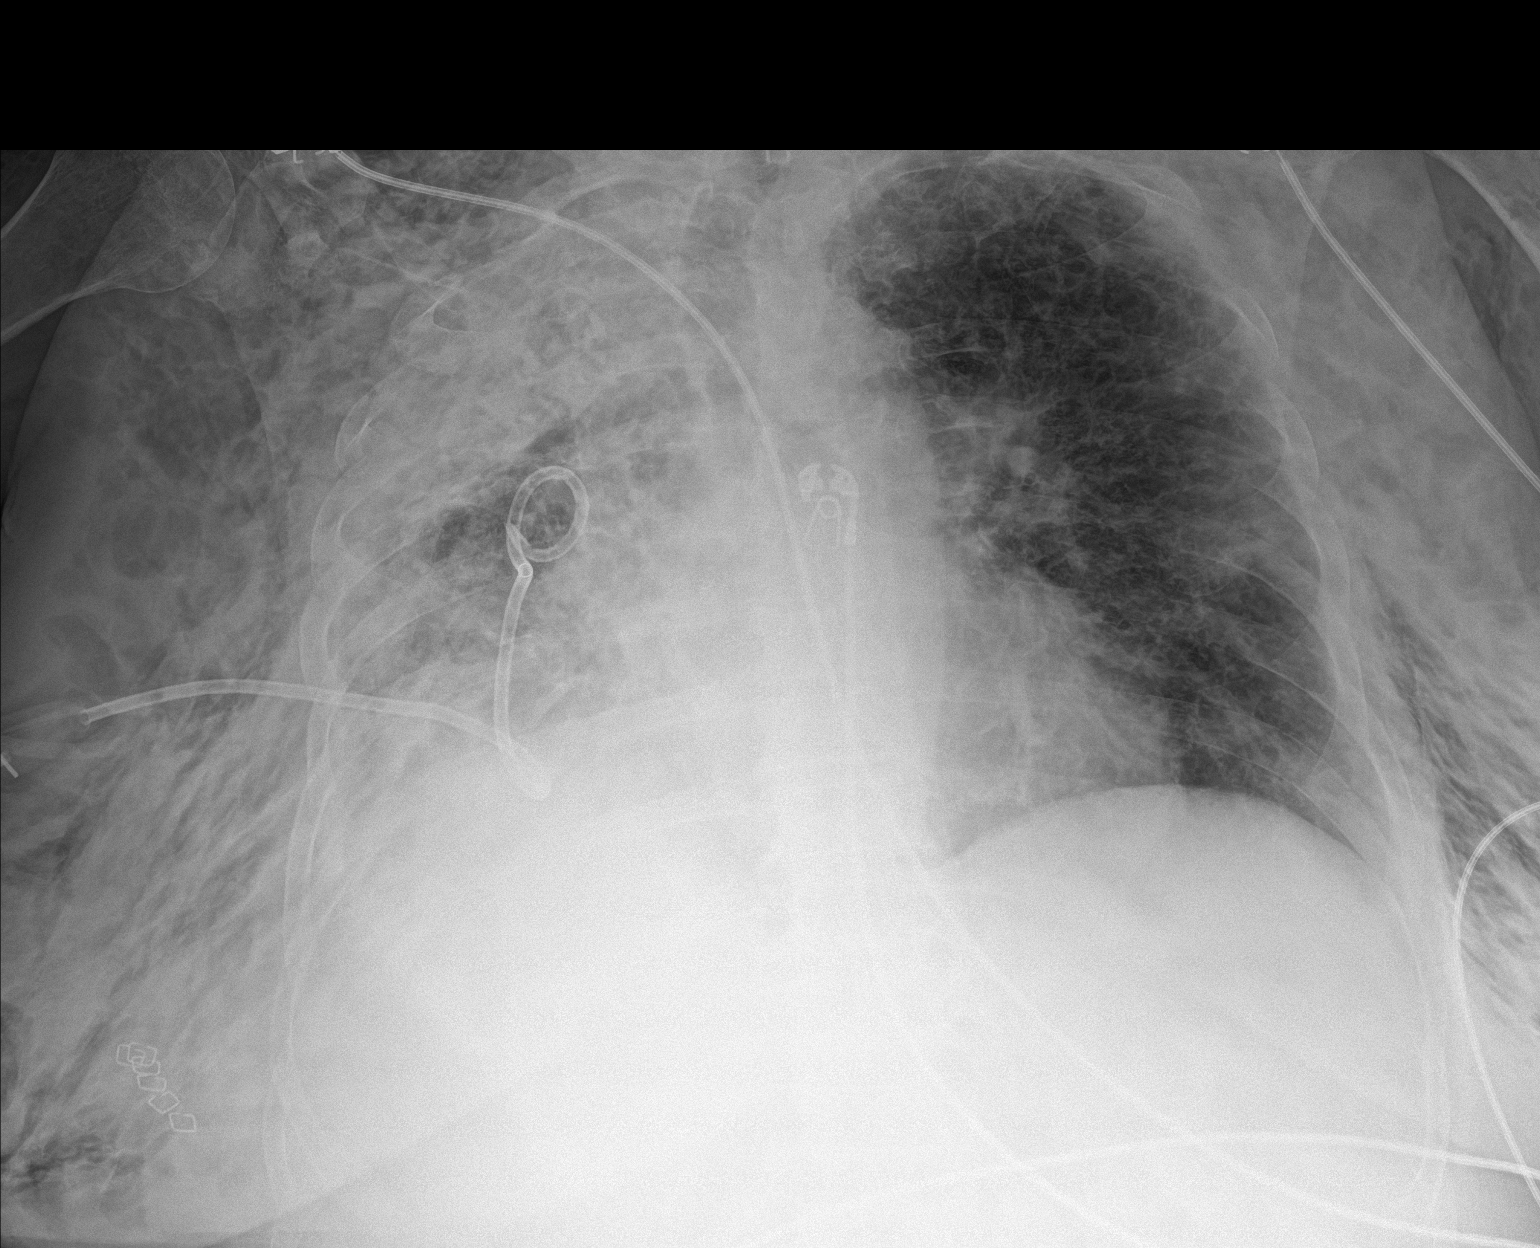

[1 of 1 positions shown; findings below may reference images not displayed]

FINDINGS: Chest tube again noted on the right. No appreciable pneumothorax.
Extensive subcutaneous emphysema remains. There is extensive
airspace opacity throughout much of the right lung, stable. Mild
atelectasis left mid and lower lung regions. Heart size and
pulmonary vascularity normal. No adenopathy. No appreciable bone
lesions.
IMPRESSION: Chest tube again noted on the right. No pneumothorax. Extensive
subcutaneous emphysema remains. Extensive consolidation throughout
much of the right lung, particularly more superiorly on the right,
persists. Patchy atelectasis on the left. Stable cardiac silhouette.

## 2021-08-13 IMAGING — CR DG CHEST 2V
2 series · 2 of 2 positions shown · non-contrast
Comparison: 06/02/2020.

CLINICAL DATA: Postoperative evaluation.

EXAM:
CHEST - 2 VIEW

[chest lat]
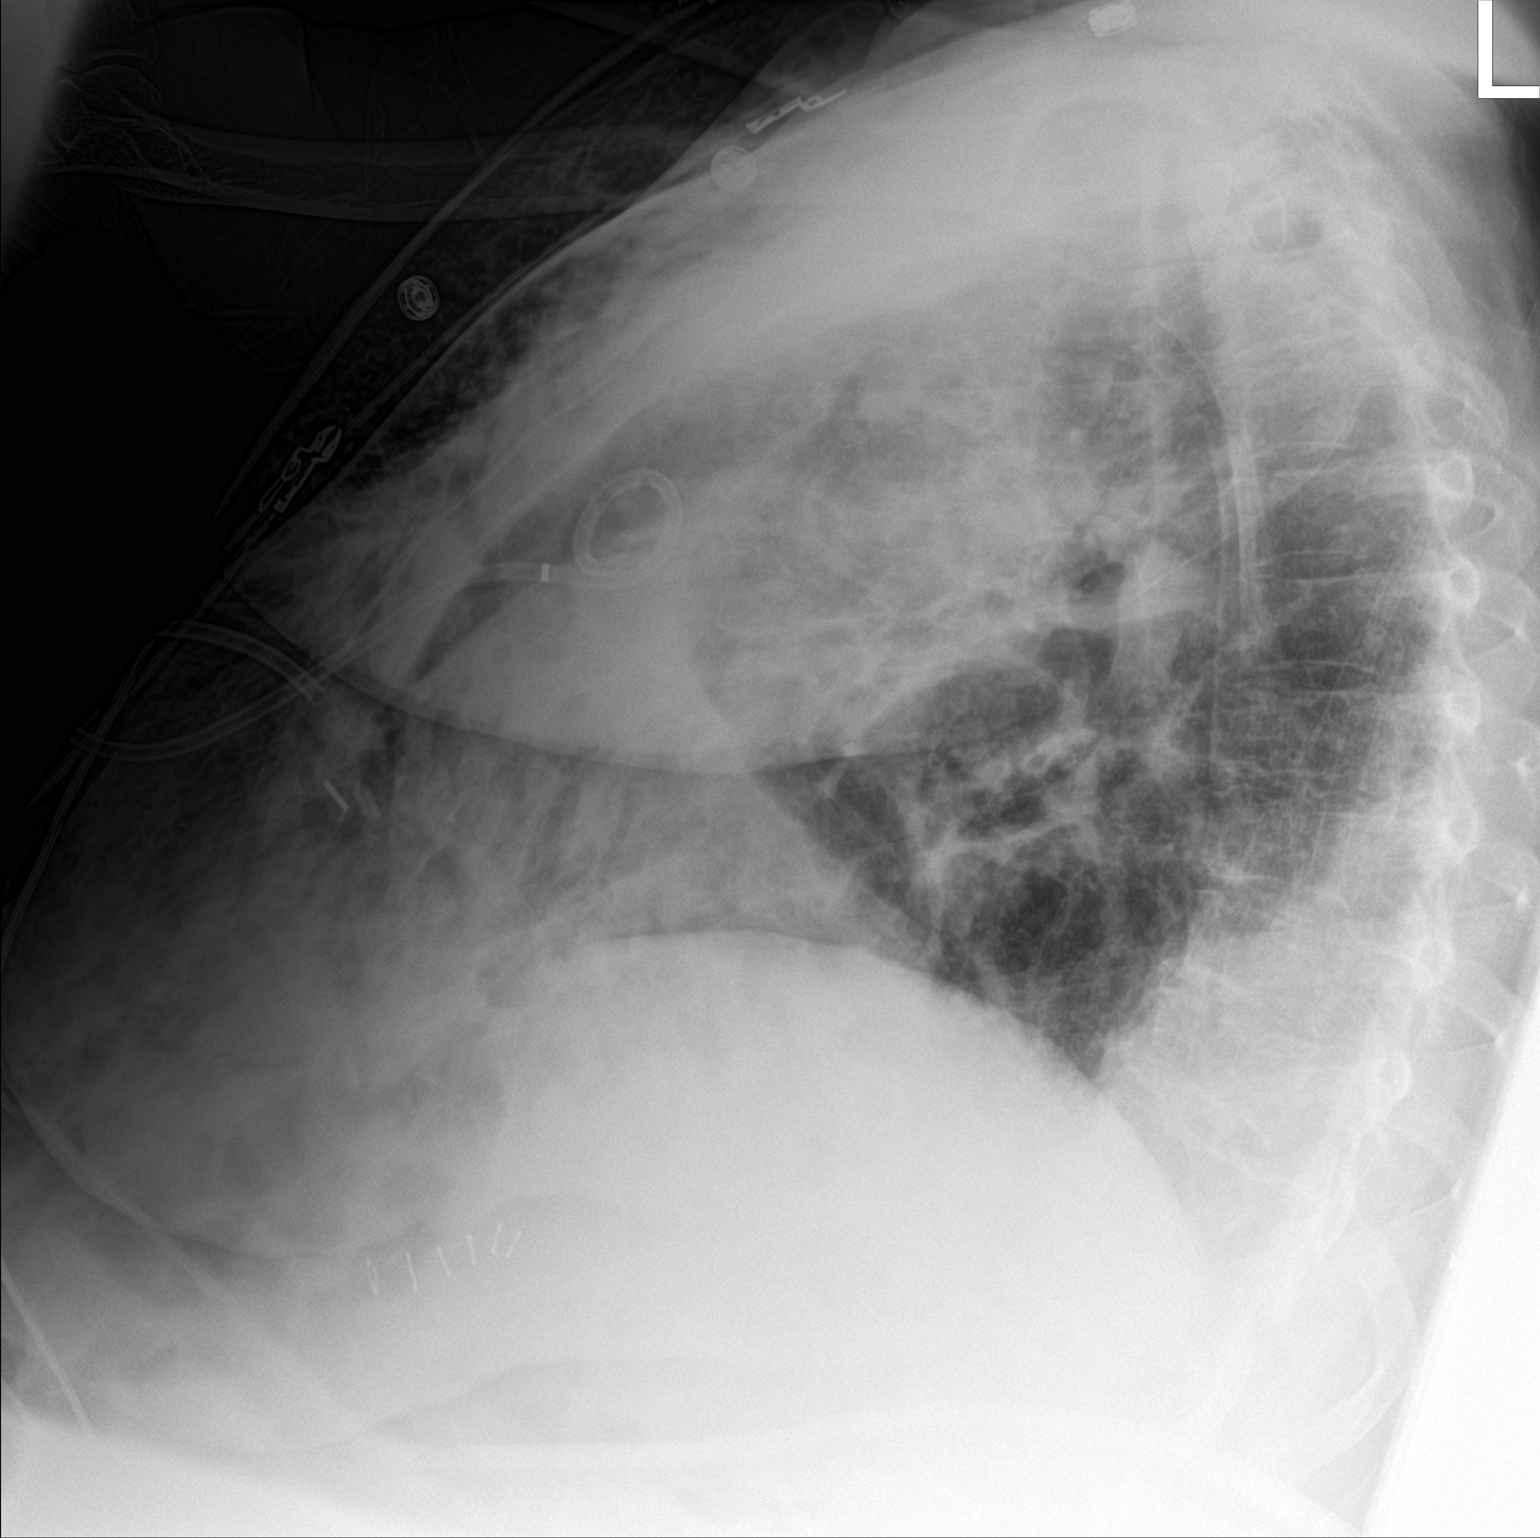

[chest ap]
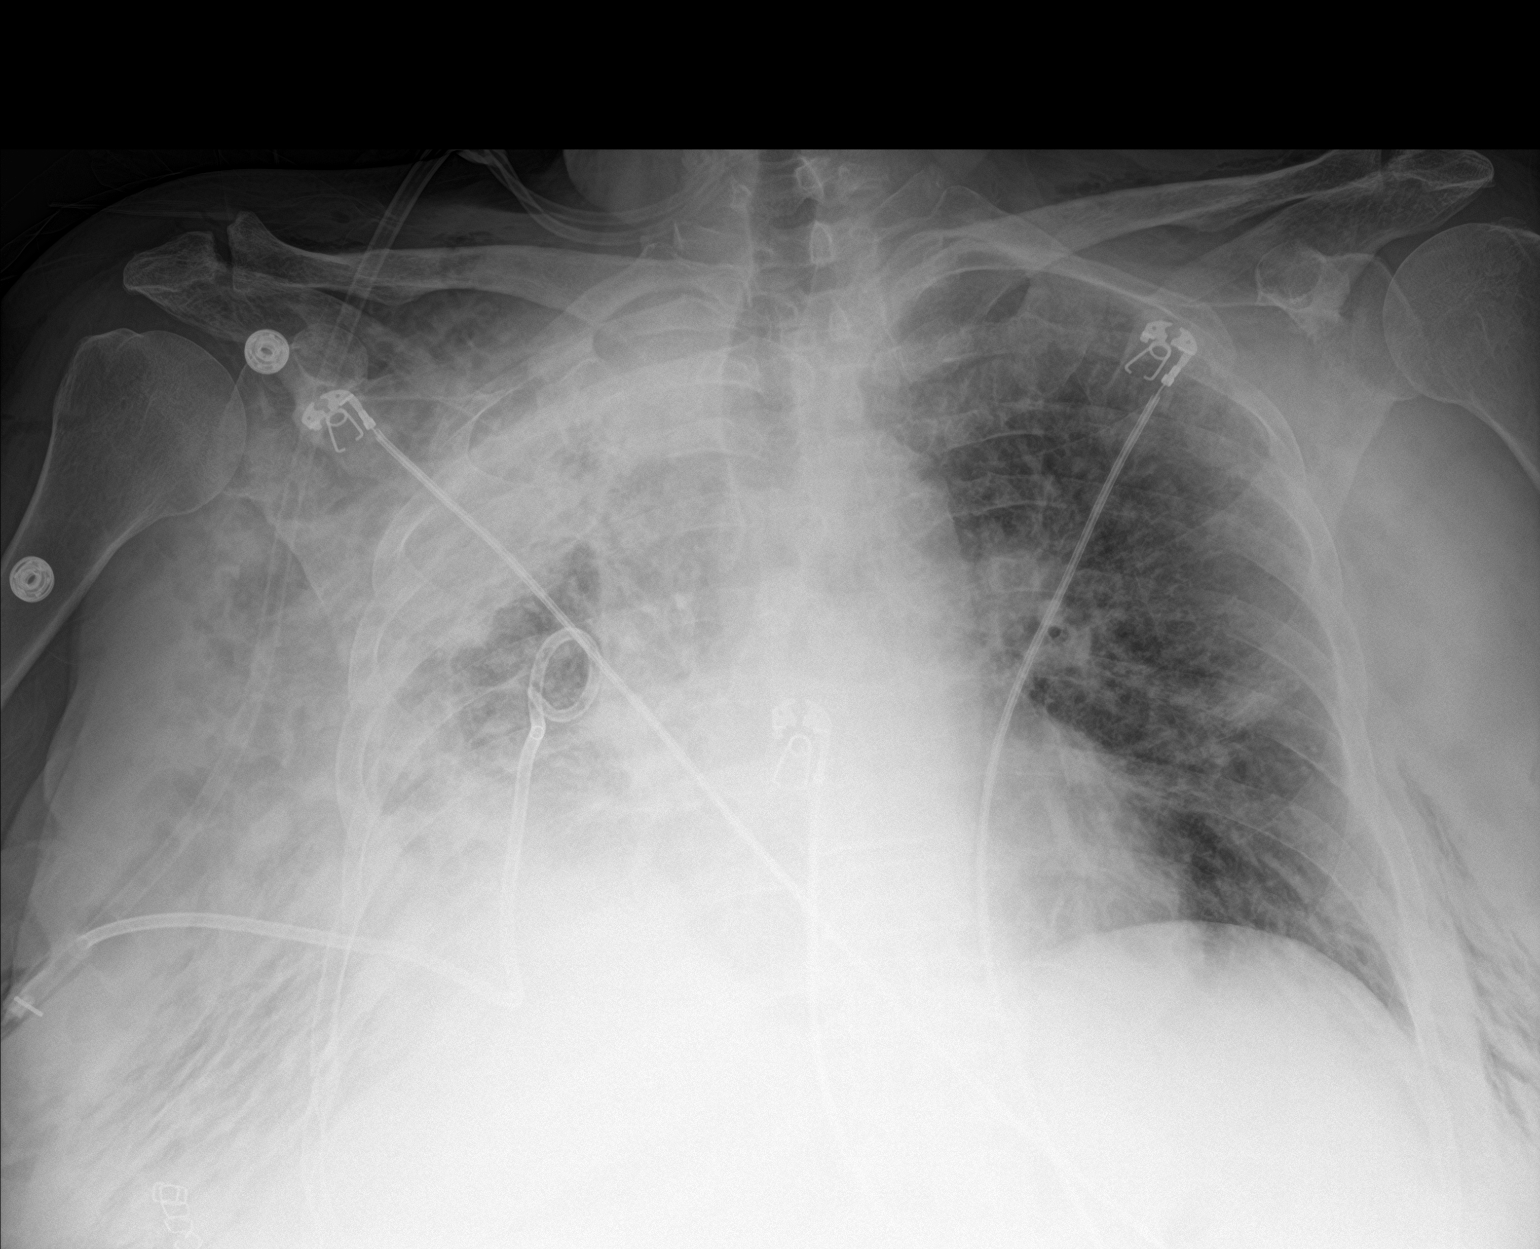

[2 of 2 positions shown; findings below may reference images not displayed]

FINDINGS: Right chest tube in stable position. No pneumothorax. Persistent
multifocal infiltrates in the right lung particularly in the right
upper lung. Persistent low lung volumes with bibasilar atelectasis.
Heart size stable. Extensive chest wall subcutaneous emphysema again
noted. Surgical staples noted over the right lower chest.
IMPRESSION: 1. Right chest tube in stable position. No pneumothorax. Extensive
chest wall subcutaneous emphysema again noted.
2. Persistent multifocal infiltrates in the right lung particularly
in the right upper lung. Low lung volumes with persistent bibasilar
atelectasis.

## 2022-03-15 DEATH — deceased
